# Patient Record
Sex: Male | Born: 1935
Health system: Southern US, Community
[De-identification: ages and names within clinical notes are randomized; demographics above are authoritative.]

## PROBLEM LIST (undated history)

## (undated) DIAGNOSIS — N400 Enlarged prostate without lower urinary tract symptoms: Secondary | ICD-10-CM

## (undated) DIAGNOSIS — C61 Malignant neoplasm of prostate: Secondary | ICD-10-CM

## (undated) DIAGNOSIS — I10 Essential (primary) hypertension: Secondary | ICD-10-CM

## (undated) DIAGNOSIS — I499 Cardiac arrhythmia, unspecified: Secondary | ICD-10-CM

## (undated) DIAGNOSIS — K219 Gastro-esophageal reflux disease without esophagitis: Secondary | ICD-10-CM

## (undated) DIAGNOSIS — C189 Malignant neoplasm of colon, unspecified: Secondary | ICD-10-CM

## (undated) DIAGNOSIS — H9190 Unspecified hearing loss, unspecified ear: Secondary | ICD-10-CM

## (undated) HISTORY — DX: Malignant neoplasm of prostate: C61

## (undated) HISTORY — PX: HERNIA REPAIR: SHX51

---

## 1997-12-13 DIAGNOSIS — C189 Malignant neoplasm of colon, unspecified: Secondary | ICD-10-CM

## 1997-12-13 HISTORY — PX: COLON SURGERY: SHX602

## 1997-12-13 HISTORY — DX: Malignant neoplasm of colon, unspecified: C18.9

## 2000-12-13 HISTORY — PX: COLONOSCOPY: SHX174

## 2001-04-18 ENCOUNTER — Ambulatory Visit (HOSPITAL_COMMUNITY): Admission: RE | Admit: 2001-04-18 | Discharge: 2001-04-18 | Payer: Self-pay | Admitting: Internal Medicine

## 2005-10-08 ENCOUNTER — Emergency Department (HOSPITAL_COMMUNITY): Admission: EM | Admit: 2005-10-08 | Discharge: 2005-10-08 | Payer: Self-pay | Admitting: Emergency Medicine

## 2005-10-21 ENCOUNTER — Ambulatory Visit: Payer: Self-pay | Admitting: Orthopedic Surgery

## 2009-05-28 ENCOUNTER — Emergency Department (HOSPITAL_COMMUNITY): Admission: EM | Admit: 2009-05-28 | Discharge: 2009-05-28 | Payer: Self-pay | Admitting: Emergency Medicine

## 2009-05-31 ENCOUNTER — Observation Stay (HOSPITAL_COMMUNITY): Admission: EM | Admit: 2009-05-31 | Discharge: 2009-06-01 | Payer: Self-pay | Admitting: Emergency Medicine

## 2010-08-02 ENCOUNTER — Inpatient Hospital Stay (HOSPITAL_COMMUNITY): Admission: EM | Admit: 2010-08-02 | Discharge: 2010-08-04 | Payer: Self-pay | Admitting: Emergency Medicine

## 2010-08-27 ENCOUNTER — Ambulatory Visit: Payer: Self-pay | Admitting: Internal Medicine

## 2010-09-16 DIAGNOSIS — D649 Anemia, unspecified: Secondary | ICD-10-CM

## 2010-09-16 DIAGNOSIS — K921 Melena: Secondary | ICD-10-CM

## 2011-01-12 NOTE — Letter (Signed)
Summary: TCS ORDER  TCS ORDER   Imported By: Ave Filter 08/27/2010 11:59:34  _____________________________________________________________________  External Attachment:    Type:   Image     Comment:   External Document  Appended Document: TCS ORDER PATIENTS DAUGHTER CALLED AND STATED SHE WOULD NEED TO CX THIS APPOINTMENT MR Brubacher FELL AND IS IN ALOT OF PAIN AT THIS TIME AND THEY WOULD CALL BACK AT A LATER DATE TO RESCHEDULE

## 2011-01-12 NOTE — Assessment & Plan Note (Signed)
Summary: HOSP F/U PARTIAL BOWEL OBSTRUCTION/LAW   Visit Type:  Follow-up Visit Primary Care Provider:  none  Chief Complaint:  follow up from hosp- bowel obstruction. pt feels good now.  History of Present Illness: Patient is a 77 year old gentleman recently admitted to the hospital with an SBO secondary to incarcerated right inguinal hernia. This was easily reduced by the ED physician. During his brief hospitalization he was noted to have a diminution in hemoglobin from 13-10 and he was occult blood positive. Clinically, has not had any melena or rectal bleeding. Hasn't had any abdominal pain has any recurrent obstructing symptoms. He saw Dr. Lovell Sheehan while in the hospital. He felt urgent surgery was not needed. Tony Holt has occasional reflux symptoms on the order of about once weekly. He is not on any aci suppression therapy. He does not have a primary care physician. He states his last colonoscopy was 7-8 years ago. He states he had trhough this office.  In fact, in reviewing the old medical records here, this gentleman was found to have a sigmoid colon cancer back in 1999.  He underwent a segmental resection by Drs. Katrinka Blazing and [place]. He failed to return for his one-year followup; he did return for three-year followup in 2002. He was found to have multiple colonic polyps which were removed via snare. These polyps turn out to be adenomatous. This gentleman apparently has not come back for any surveillance examinations since 2002.  Preventive Screening-Counseling & Management  Alcohol-Tobacco     Smoking Status: never  Current Medications (verified): 1)  Tylenol Pm Extra Strength 500-25 Mg Tabs (Diphenhydramine-Apap (Sleep)) .... At Bedtime  Allergies (verified): No Known Drug Allergies  Past History:  Past Medical History: bowel obstruction   Past Surgical History: hernia repair tumor in [place]  Family History: Father: deceased 48 MI Mother: deceased Siblings:3 brothers, 2  sisters  No FH of Colon Cancer:  Social History: Marital Status:  Children: 4 Occupation: retired Patient has never smoked.  Alcohol Use - no Smoking Status:  never  Vital Signs:  Patient profile:   77 year old male Height:      69 inches Weight:      130 pounds BMI:     19.27 Temp:     97.9 degrees F oral Pulse rate:   60 / minute BP sitting:   138 / 90  (left arm) Cuff size:   regular  Vitals Entered By: Tony Limes LPN (August 27, 2010 11:15 AM)  Physical Exam  General:  pleasant alert gentleman well oriented conversant and in no acute distress Eyes:  no scleral icterus. Conjunctiva are peak Lungs:  clear to auscultation. Heart:  regular rate and rhythm without murmur gallop rub Abdomen:  flat, positive bowel sounds, soft nontender;  no mass or hepatosplenomegaly  Impression & Recommendations: Impression: Very pleasant 77 year old gentleman recently hospitalized with transient incarcerated right inguinal hernia producing a small bowel obstruction. He had a notable diminution and hemoglobin and was occult blood positive. He has a most significant history for sigmoid colon cancer resected back in 99. His last colonoscopy was in 2002. At that time, he had multiple colonic adenomas. This gentleman is far overdue for surveillance examination. His anemia and Hemoccult-positive stool deserve further evaluation as soon as possible.  Recommendations: Diagnostic colonoscopy in the very near future. I discussed the risks, benefits, limitations, alternatives and imponderables with this nice gentleman today. His questions have been answered. He is agreeable. Further recommendations to follow.  Appended Document: Orders Update  Clinical Lists Changes  Problems: Added new problem of ANEMIA (ICD-285.9) Added new problem of HEMOCCULT POSITIVE STOOL (ICD-578.1) Orders: Added new Service order of New Patient Level III [phone]) - Signed

## 2011-02-07 ENCOUNTER — Emergency Department (HOSPITAL_COMMUNITY): Payer: Medicare PPO

## 2011-02-07 ENCOUNTER — Inpatient Hospital Stay (HOSPITAL_COMMUNITY)
Admission: RE | Admit: 2011-02-07 | Discharge: 2011-02-09 | DRG: 394 | Disposition: A | Discharge status: Home / Self Care | Payer: Medicare PPO | Source: Ambulatory Visit | Attending: Family Medicine | Admitting: Family Medicine

## 2011-02-07 DIAGNOSIS — N390 Urinary tract infection, site not specified: Secondary | ICD-10-CM | POA: Diagnosis present

## 2011-02-07 DIAGNOSIS — K4090 Unilateral inguinal hernia, without obstruction or gangrene, not specified as recurrent: Principal | ICD-10-CM | POA: Diagnosis present

## 2011-02-07 LAB — HEPATIC FUNCTION PANEL
ALT: 10 U/L (ref 0–53)
AST: 18 U/L (ref 0–37)
Albumin: 4.6 g/dL (ref 3.5–5.2)
Alkaline Phosphatase: 54 U/L (ref 39–117)
Bilirubin, Direct: 0.2 mg/dL (ref 0.0–0.3)
Indirect Bilirubin: 0.9 mg/dL (ref 0.3–0.9)
Total Bilirubin: 1.1 mg/dL (ref 0.3–1.2)
Total Protein: 7.6 g/dL (ref 6.0–8.3)

## 2011-02-07 LAB — URINALYSIS, ROUTINE W REFLEX MICROSCOPIC
Ketones, ur: 15 mg/dL — AB
Leukocytes, UA: NEGATIVE
Nitrite: NEGATIVE
Specific Gravity, Urine: >1.03 — ABNORMAL HIGH (ref 1.005–1.030)
Urine Glucose, Fasting: NEGATIVE mg/dL
Urobilinogen, UA: 0.2 mg/dL (ref 0.0–1.0)
pH: 5 (ref 5.0–8.0)

## 2011-02-07 LAB — BASIC METABOLIC PANEL
BUN: 13 mg/dL (ref 6–23)
CO2: 23 mEq/L (ref 19–32)
Calcium: 10.3 mg/dL (ref 8.4–10.5)
Chloride: 103 mEq/L (ref 96–112)
Creatinine, Ser: 1.1 mg/dL (ref 0.4–1.5)
GFR calc Af Amer: >60 mL/min (ref >60)
GFR calc non Af Amer: >60 mL/min (ref >60)
Glucose, Bld: 176 mg/dL — ABNORMAL HIGH (ref 70–99)
Sodium: 139 mEq/L (ref 135–145)

## 2011-02-07 LAB — CBC
Hemoglobin: 14.6 g/dL (ref 13.0–17.0)
MCV: 91.6 fL (ref 78.0–100.0)
Platelets: 192 10*3/uL (ref 150–400)
RBC: 4.78 MIL/uL (ref 4.22–5.81)
RDW: 13.2 % (ref 11.5–15.5)
WBC: 18.5 10*3/uL — ABNORMAL HIGH (ref 4.0–10.5)

## 2011-02-07 LAB — DIFFERENTIAL
Basophils Absolute: 0 10*3/uL (ref 0.0–0.1)
Basophils Relative: 0 % (ref 0–1)
Eosinophils Absolute: 0.1 10*3/uL (ref 0.0–0.7)
Eosinophils Relative: 0 % (ref 0–5)
Lymphocytes Relative: 2 % — ABNORMAL LOW (ref 12–46)
Lymphs Abs: 0.4 10*3/uL — ABNORMAL LOW (ref 0.7–4.0)
Monocytes Absolute: 1.2 10*3/uL — ABNORMAL HIGH (ref 0.1–1.0)
Monocytes Relative: 6 % (ref 3–12)
Neutro Abs: 16.8 10*3/uL — ABNORMAL HIGH (ref 1.7–7.7)
Neutrophils Relative %: 91 % — ABNORMAL HIGH (ref 43–77)

## 2011-02-07 LAB — LIPASE, BLOOD: Lipase: 18 U/L (ref 11–59)

## 2011-02-07 LAB — URINE MICROSCOPIC-ADD ON

## 2011-02-07 MED ORDER — IOHEXOL 300 MG/ML  SOLN
100.0000 mL | Freq: Once | INTRAMUSCULAR | Status: AC | PRN
  Administered 2011-02-07: 100 mL via INTRAVENOUS

## 2011-02-08 ENCOUNTER — Inpatient Hospital Stay (HOSPITAL_COMMUNITY): Payer: Medicare PPO

## 2011-02-08 LAB — COMPREHENSIVE METABOLIC PANEL
ALT: 9 U/L (ref 0–53)
AST: 14 U/L (ref 0–37)
Albumin: 3.7 g/dL (ref 3.5–5.2)
CO2: 22 mEq/L (ref 19–32)
Chloride: 106 mEq/L (ref 96–112)
Creatinine, Ser: 0.87 mg/dL (ref 0.4–1.5)
GFR calc Af Amer: >60 mL/min (ref >60)
GFR calc non Af Amer: >60 mL/min (ref >60)
Glucose, Bld: 111 mg/dL — ABNORMAL HIGH (ref 70–99)
Sodium: 138 mEq/L (ref 135–145)
Total Bilirubin: 1 mg/dL (ref 0.3–1.2)

## 2011-02-08 LAB — DIFFERENTIAL
Basophils Absolute: 0 10*3/uL (ref 0.0–0.1)
Basophils Relative: 0 % (ref 0–1)
Eosinophils Relative: 0 % (ref 0–5)
Monocytes Absolute: 1 10*3/uL (ref 0.1–1.0)
Neutro Abs: 10.8 10*3/uL — ABNORMAL HIGH (ref 1.7–7.7)

## 2011-02-08 LAB — CBC
Hemoglobin: 12.3 g/dL — ABNORMAL LOW (ref 13.0–17.0)
MCHC: 33.1 g/dL (ref 30.0–36.0)
Platelets: 154 10*3/uL (ref 150–400)
RDW: 13 % (ref 11.5–15.5)

## 2011-02-08 LAB — GLUCOSE, CAPILLARY
Glucose-Capillary: 115 mg/dL — ABNORMAL HIGH (ref 70–99)
Glucose-Capillary: 125 mg/dL — ABNORMAL HIGH (ref 70–99)
Glucose-Capillary: 119 mg/dL — ABNORMAL HIGH (ref 70–99)

## 2011-02-08 LAB — HEMOGLOBIN A1C: Mean Plasma Glucose: 117 mg/dL — ABNORMAL HIGH (ref <117)

## 2011-02-08 LAB — TSH: TSH: 1.997 u[IU]/mL (ref 0.350–4.500)

## 2011-02-08 LAB — MAGNESIUM: Magnesium: 1.8 mg/dL (ref 1.5–2.5)

## 2011-02-09 LAB — GLUCOSE, CAPILLARY
Glucose-Capillary: 106 mg/dL — ABNORMAL HIGH (ref 70–99)
Glucose-Capillary: 97 mg/dL (ref 70–99)

## 2011-02-09 LAB — URINE CULTURE

## 2011-02-10 LAB — GLUCOSE, CAPILLARY: Glucose-Capillary: 99 mg/dL (ref 70–99)

## 2011-02-11 HISTORY — PX: HERNIA REPAIR: SHX51

## 2011-02-12 NOTE — Discharge Summary (Signed)
  Tony Holt, Tony Holt               ACCOUNT NO.:  [id number]  MEDICAL RECORD NO.:  [id number]           PATIENT TYPE:  I  LOCATION:  A327                          FACILITY:  [facility]  PHYSICIAN:  Tarry Kos, MD       DATE OF BIRTH:  04-10-1926  DATE OF ADMISSION:  02/07/2011 DATE OF DISCHARGE:  02/28/2012LH                              DISCHARGE SUMMARY   DISCHARGE DIAGNOSES: 1. Abdominal pain. 2. Right reducible inguinal hernia.  SUMMARY OF HOSPITAL COURSE:  [name] is a pleasant 84-year male who presented to the hospital with right lower abdominal pain due to an inguinal hernia.  It was reduced in the ED.  He was observed overnight. Surgery was consulted and recommended followup with them in approximately 2 weeks for elective outpatient surgical repair of this hernia.  [name] is tolerating a diet at the time of discharge.  His hernia was reduced and his abdominal pain resolved.  VITAL SIGNS:  He has been afebrile.  Vital signs stable. GENERAL:  Alert and oriented x4.  No apparent distress, cooperative, friendly. COR:  Regular rate and rhythm without murmurs or gallops. CHEST:  Clear to auscultation bilaterally.  No wheezes or rales. ABDOMEN:  Soft, nontender, nondistended.  Positive bowel sounds.  No hepatosplenomegaly. EXTREMITIES:  No clubbing, cyanosis, or edema. PSYCHIATRIC:  Normal mood and affect. NEUROLOGIC:  No focal neurologic deficits. SKIN:  No rashes.  He is being discharged home to follow up with his primary care physician in 1 week.  He is to continue taking his home medication regimen which only consists of Tylenol PM as needed.  He is to follow up with PCP in 1 week and follow up with Surgery in 2 weeks, Dr. Lovell Sheehan' group for repair of the hernia.                                           ______________________________ Tarry Kos, MD     RD/MEDQ  D:  02/09/2011  T:  02/10/2011  Job:  [id number]  Electronically Signed by Eldridge Dace MD on  02/12/2011 02:11:04 PM

## 2011-02-26 LAB — CBC
HCT: 28 % — ABNORMAL LOW (ref 39.0–52.0)
Hemoglobin: 13.9 g/dL (ref 13.0–17.0)
Hemoglobin: 9.3 g/dL — ABNORMAL LOW (ref 13.0–17.0)
MCH: 31.4 pg (ref 26.0–34.0)
MCHC: 33.3 g/dL (ref 30.0–36.0)
MCHC: 34 g/dL (ref 30.0–36.0)
MCV: 94.6 fL (ref 78.0–100.0)
Platelets: 118 10*3/uL — ABNORMAL LOW (ref 150–400)
Platelets: 180 10*3/uL (ref 150–400)
RBC: 2.96 MIL/uL — ABNORMAL LOW (ref 4.22–5.81)
RBC: 3.17 MIL/uL — ABNORMAL LOW (ref 4.22–5.81)
RBC: 4.37 MIL/uL (ref 4.22–5.81)
RDW: 14.2 % (ref 11.5–15.5)
WBC: 6.1 10*3/uL (ref 4.0–10.5)

## 2011-02-26 LAB — URINALYSIS, ROUTINE W REFLEX MICROSCOPIC
Bilirubin Urine: NEGATIVE
Glucose, UA: NEGATIVE mg/dL
Urobilinogen, UA: 0.2 mg/dL (ref 0.0–1.0)

## 2011-02-26 LAB — BASIC METABOLIC PANEL
Calcium: 10.2 mg/dL (ref 8.4–10.5)
Creatinine, Ser: 1.18 mg/dL (ref 0.4–1.5)
GFR calc Af Amer: >60 mL/min (ref >60)
GFR calc non Af Amer: 59 mL/min — ABNORMAL LOW (ref >60)
Glucose, Bld: 137 mg/dL — ABNORMAL HIGH (ref 70–99)
Sodium: 138 mEq/L (ref 135–145)

## 2011-02-26 LAB — DIFFERENTIAL
Basophils Absolute: 0 10*3/uL (ref 0.0–0.1)
Basophils Absolute: 0 10*3/uL (ref 0.0–0.1)
Eosinophils Absolute: 0 10*3/uL (ref 0.0–0.7)
Eosinophils Absolute: 0 10*3/uL (ref 0.0–0.7)
Eosinophils Relative: 1 % (ref 0–5)
Lymphocytes Relative: 14 % (ref 12–46)
Lymphocytes Relative: 17 % (ref 12–46)
Lymphs Abs: 0.6 10*3/uL — ABNORMAL LOW (ref 0.7–4.0)
Lymphs Abs: 0.8 10*3/uL (ref 0.7–4.0)
Lymphs Abs: 0.8 10*3/uL (ref 0.7–4.0)
Monocytes Absolute: 0.5 10*3/uL (ref 0.1–1.0)
Monocytes Relative: 8 % (ref 3–12)
Neutro Abs: 14.8 10*3/uL — ABNORMAL HIGH (ref 1.7–7.7)
Neutro Abs: 4.7 10*3/uL (ref 1.7–7.7)
Neutrophils Relative %: 70 % (ref 43–77)
Neutrophils Relative %: 77 % (ref 43–77)
Neutrophils Relative %: 90 % — ABNORMAL HIGH (ref 43–77)

## 2011-02-26 LAB — LACTIC ACID, PLASMA: Lactic Acid, Venous: 1.5 mmol/L (ref 0.5–2.2)

## 2011-02-26 LAB — COMPREHENSIVE METABOLIC PANEL
Albumin: 3.2 g/dL — ABNORMAL LOW (ref 3.5–5.2)
Alkaline Phosphatase: 36 U/L — ABNORMAL LOW (ref 39–117)
BUN: 7 mg/dL (ref 6–23)
CO2: 26 mEq/L (ref 19–32)
Calcium: 8.5 mg/dL (ref 8.4–10.5)
Calcium: 8.6 mg/dL (ref 8.4–10.5)
Chloride: 109 mEq/L (ref 96–112)
Creatinine, Ser: 0.97 mg/dL (ref 0.4–1.5)
GFR calc non Af Amer: >60 mL/min (ref >60)
Glucose, Bld: 102 mg/dL — ABNORMAL HIGH (ref 70–99)
Potassium: 3.8 mEq/L (ref 3.5–5.1)
Total Bilirubin: 0.4 mg/dL (ref 0.3–1.2)
Total Protein: 5.2 g/dL — ABNORMAL LOW (ref 6.0–8.3)

## 2011-02-26 LAB — POCT CARDIAC MARKERS
CKMB, poc: 1.2 ng/mL (ref 1.0–8.0)
Myoglobin, poc: 54.3 ng/mL (ref 12–200)

## 2011-02-26 LAB — MRSA PCR SCREENING: MRSA by PCR: NEGATIVE

## 2011-02-26 LAB — URINE MICROSCOPIC-ADD ON

## 2011-02-26 LAB — HEMOCCULT GUIAC POC 1CARD (OFFICE): Fecal Occult Bld: POSITIVE

## 2011-02-26 LAB — HEPATIC FUNCTION PANEL
ALT: 11 U/L (ref 0–53)
Indirect Bilirubin: 0.8 mg/dL (ref 0.3–0.9)
Total Protein: 7.3 g/dL (ref 6.0–8.3)

## 2011-02-26 LAB — LIPASE, BLOOD: Lipase: 26 U/L (ref 11–59)

## 2011-02-26 LAB — AMYLASE: Amylase: 50 U/L (ref 0–105)

## 2011-03-08 ENCOUNTER — Other Ambulatory Visit: Payer: Self-pay | Admitting: General Surgery

## 2011-03-08 ENCOUNTER — Encounter (HOSPITAL_COMMUNITY): Payer: Medicare PPO | Attending: General Surgery

## 2011-03-08 DIAGNOSIS — Z01812 Encounter for preprocedural laboratory examination: Secondary | ICD-10-CM | POA: Insufficient documentation

## 2011-03-08 LAB — CBC
HCT: 38.3 % — ABNORMAL LOW (ref 39.0–52.0)
MCV: 91.8 fL (ref 78.0–100.0)
Platelets: 147 10*3/uL — ABNORMAL LOW (ref 150–400)
RBC: 4.17 MIL/uL — ABNORMAL LOW (ref 4.22–5.81)
RDW: 13 % (ref 11.5–15.5)
WBC: 5.2 10*3/uL (ref 4.0–10.5)

## 2011-03-08 LAB — BASIC METABOLIC PANEL
BUN: 12 mg/dL (ref 6–23)
Chloride: 105 mEq/L (ref 96–112)
GFR calc non Af Amer: >60 mL/min (ref >60)
Potassium: 3.4 mEq/L — ABNORMAL LOW (ref 3.5–5.1)
Sodium: 140 mEq/L (ref 135–145)

## 2011-03-08 LAB — SURGICAL PCR SCREEN: Staphylococcus aureus: NEGATIVE

## 2011-03-12 ENCOUNTER — Ambulatory Visit (HOSPITAL_COMMUNITY)
Admission: RE | Admit: 2011-03-12 | Discharge: 2011-03-12 | Disposition: A | Discharge status: Home / Self Care | Payer: Medicare PPO | Source: Ambulatory Visit | Attending: General Surgery | Admitting: General Surgery

## 2011-03-12 DIAGNOSIS — K4090 Unilateral inguinal hernia, without obstruction or gangrene, not specified as recurrent: Secondary | ICD-10-CM | POA: Insufficient documentation

## 2011-03-12 DIAGNOSIS — J449 Chronic obstructive pulmonary disease, unspecified: Secondary | ICD-10-CM | POA: Insufficient documentation

## 2011-03-12 DIAGNOSIS — J4489 Other specified chronic obstructive pulmonary disease: Secondary | ICD-10-CM | POA: Insufficient documentation

## 2011-03-12 NOTE — H&P (Addendum)
NAMEWEILAND, TOMICH               ACCOUNT NO.:  [id number]  MEDICAL RECORD NO.:  [id number]           PATIENT TYPE:  LOCATION:                                 FACILITY:  PHYSICIAN:  Tilford Pillar, MD      DATE OF BIRTH:  Apr 29, 1926  DATE OF ADMISSION: DATE OF DISCHARGE:  LH                             HISTORY & PHYSICAL   CHIEF COMPLAINT:  Increased discomfort and bulge in the groin.  HISTORY OF PRESENT ILLNESS:  The patient is an 77 year old male recently evaluated by myself in consultation at [facility] with a noted right inguinal hernia.  It actually presented with his bowel obstruction, was admitted to the Hospitalist.  His symptomatology resolved, and although the hernia was suspected as the likely source of his small bowel obstruction, it was advised the patient undergo repair. He has sign of no increased shortness of breath, no increased pain, no change in bowel movements, no melena, no hematochezia, no nausea, no vomiting, no recurrent symptoms of incarceration or strangulation.  PAST MEDICAL HISTORY:  History of gastroenteritis, history of emphysema.  PAST SURGICAL HISTORY:  None.  MEDICATIONS:  Reviewed with the patient and include home oxygen, Atrovent.  ALLERGIES:  No known drug allergies.  SOCIAL HISTORY:  No tobacco.  No alcohol.  No recreational drug use.  FAMILY HISTORY:  Noncontributory.  REVIEW OF SYSTEMS:  CONSTITUTIONAL:  Unremarkable.  EYES:  Unremarkable. EARS, NOSE, AND THROAT:  Unremarkable.  RESPIRATORY:  Complains of shortness of breath and chest.  No change from baseline. CARDIOVASCULAR:  Unremarkable.  GASTROINTESTINAL:  As per HPI. GENITOURINARY:  Unremarkable.  MUSCULOSKELETAL:  Unremarkable. NEUROLOGIC:  Unremarkable.  SKIN:  Unremarkable.  PHYSICAL EXAMINATION:  GENERAL:  The patient is an elderly-appearing male.  He is calm.  He is not in acute distress.  He is alert and oriented x3. HEENT:  Scalp no deformities or masses.   Eyes, pupils equal, round, reactive.  Extraocular movements are intact.  No scleral icterus, conjunctival pallor.  Oral mucosa pink, normal occlusion. NECK:  Trachea is midline.  No cervical lymphadenopathy. PULMONARY:  Unlabored respiration.  He does have wheezing and some fine crackles.  He has full breath sounds bilaterally. CARDIOVASCULAR:  Regular rate and rhythm.  No murmurs or gallops. ABDOMEN:  Positive bowel sounds.  Abdomen is soft, nontender.  He does have positive right inguinal hernia that is reducible.  No masses or hernias appreciated.  Normal male external genitalia are noted. Bilateral testicles are noted. SKIN:  Warm and dry.  ASSESSMENT AND PLAN:  Right inguinal hernia.  At this time, I discussed again with the patient the risks, benefits, and alternatives of an inguinal hernia repair with mesh including but not limited to the risk of bleeding, infection, infection, mesh requiring removal and subsequent repair, paresthesia, testicular loss, chronic pain, as well as possibility of intraoperative cardiopulmonary event.  His questions and concerns were addressed, and he will be consented and scheduled at his earliest convenience.     Tilford Pillar, MD     BZ/MEDQ  D:  03/11/2011  T:  03/11/2011  Job:  [id number]  Electronically Signed by Tilford Pillar MD on 03/12/2011 12:23:19 PM

## 2011-03-15 NOTE — H&P (Signed)
Tony Holt, Tony Holt               ACCOUNT NO.:  [id number]  MEDICAL RECORD NO.:  [id number]           PATIENT TYPE:  LOCATION:                                 FACILITY:  PHYSICIAN:  Eduard Clos, MDDATE OF BIRTH:  Apr 18, 1926  DATE OF ADMISSION: DATE OF DISCHARGE:  LH                             HISTORY & PHYSICAL   PRIMARY CARE PHYSICIAN:  Unassigned.  CHIEF COMPLAINT:  Abdominal pain.  HISTORY OF PRESENT ILLNESS:  This is an 77 year old male with no significant past medical history presenting with complaint of abdominal pain.  The pain is mostly in the right inguinal area.  He has been having multiple episodes of nausea and vomiting.  In the ER, the patient was found to have right inguinal hernia which was reducible.  CT abdomen and pelvis was done which did not show anything acute.  The patient is still having some nausea and vomiting at this time.  The patient has been admitted for further workup.  The patient denies any chest pain or any shortness of breath.  Denies any fever or chills, cough or phlegm. Denies any dizziness, loss of conscious, any focal deficit.  The patient has had similar episode last year in August, and at that time, the patient's symptoms resolved after the hernia was reduced.  At that time, he also was found to have dilated ducts of liver which is still found.  PAST MEDICAL HISTORY:  Nothing significant.  PAST SURGICAL HISTORY:  He has had a tumor removed in his abdomen many years ago.  SOCIAL HISTORY:  The patient quit smoking but still chews tobacco, denies any alcohol or drug abuse.  FAMILY HISTORY:  Mom deceased from unknown medical illness at age 49.  ALLERGIES:  No known drug allergies.  REVIEW OF SYSTEMS:  As per history of present illness, nothing else significant.  PHYSICAL EXAMINATION:  GENERAL:  The patient was examined at bedside, not in acute distress. VITAL SIGNS:  Blood pressure is 140/50, initially when he came in it  was very high, it was 200/70; pulse is 90 per minute; temperature 98.2; respirations 18 per minute; O2 sat is 96%. HEENT:  Anicteric.  No pallor.  No discharge from ears, eyes, nose, or mouth. CHEST:  Bilateral air entry present.  No rhonchi, no crepitation. HEART:  S1 and S2 heard. ABDOMEN:  Soft, nontender.  There is a right inguinal hernia which is reducible.  No guarding or rigidity.  Bowel sounds present. CENTRAL NERVOUS SYSTEM:  Alert, awake, and oriented to time, place, and person, moves upper and lower extremities, 5/5. EXTREMITIES:  Peripheral pulses felt.  No edema.  LABORATORY DATA:  CT abdomen and pelvis with contrast shows persistent biliary dilatation, correlate with liver function tests, small amount of free fluid in the abdomen unchanged, otherwise stable exam. CBC: WBC is 18.5, hemoglobin is 14.6, hematocrit is 43.8, platelets 192, neutrophils 91%.  Complete metabolic panel:  Sodium 139, potassium 3.8, chloride 103, carbon dioxide 23, glucose 176, BUN 13, creatinine 1.1, total bilirubin is 1.1, alk phos 54, AST 18, ALT 10, total protein 7.6, albumin 4.6, potassium 10.3, lipase 18.  UA:  Glucose negative, ketones 15, nitrites and leukocytes negative, hyaline casts, wbc's 7-10, bacteria few.  ASSESSMENT: 1. Abdominal pain with right inguinal hernia, at this time it is     reducible. 2. Nausea and vomiting. 3. Leukocytosis with the possibility of urinary tract infection. 4. Persistent biliary dilatation with normal LFTs.  PLAN: 1. At this time, we will admit the patient to medical floor. 2. For the abdominal pain and inguinal hernia, the patient still has     some nausea, so I am going to keep the patient n.p.o., get a     Surgical consult with Dr. Lovell Sheehan in a.m.  At this time, I will be     hydrating the patient. 3. Leukocytosis with a possible UTI.  We will get urine culture.  I am     going to place the patient on Cipro and Flagyl. 4. Further recommendations  based on test order and clinical course and     consults recommendations.     Eduard Clos, MD     ANK/MEDQ  D:  02/08/2011  T:  02/08/2011  Job:  [id number]  Electronically Signed by Midge Minium MD on 03/15/2011 07:17:31 AM

## 2011-03-22 LAB — POCT I-STAT, CHEM 8
BUN: 16 mg/dL (ref 6–23)
Calcium, Ion: 1.21 mmol/L (ref 1.12–1.32)
Chloride: 99 mEq/L (ref 96–112)
Creatinine, Ser: 1 mg/dL (ref 0.4–1.5)
Glucose, Bld: 98 mg/dL (ref 70–99)
HCT: 36 % — ABNORMAL LOW (ref 39.0–52.0)
Potassium: 4.6 mEq/L (ref 3.5–5.1)

## 2011-03-22 LAB — DIFFERENTIAL
Lymphocytes Relative: 8 % — ABNORMAL LOW (ref 12–46)
Lymphs Abs: 0.9 10*3/uL (ref 0.7–4.0)
Monocytes Absolute: 0.5 10*3/uL (ref 0.1–1.0)
Monocytes Relative: 5 % (ref 3–12)
Neutro Abs: 9.8 10*3/uL — ABNORMAL HIGH (ref 1.7–7.7)
Neutrophils Relative %: 87 % — ABNORMAL HIGH (ref 43–77)

## 2011-03-22 LAB — CBC
Hemoglobin: 11.7 g/dL — ABNORMAL LOW (ref 13.0–17.0)
RBC: 3.67 MIL/uL — ABNORMAL LOW (ref 4.22–5.81)
WBC: 11.3 10*3/uL — ABNORMAL HIGH (ref 4.0–10.5)

## 2011-04-12 NOTE — Op Note (Signed)
Tony Holt, Tony Holt               ACCOUNT NO.:  [id number]  MEDICAL RECORD NO.:  [id number]           PATIENT TYPE:  O  LOCATION:  DAYP                          FACILITY:  [facility]  PHYSICIAN:  Tilford Pillar, MD      DATE OF BIRTH:  1926/06/14  DATE OF PROCEDURE:  03/12/2011 DATE OF DISCHARGE:  03/12/2011                              OPERATIVE REPORT   PREOPERATIVE DIAGNOSES:  Right inguinal hernia.  POSTOPERATIVE DIAGNOSES:  Right inguinal hernia.  PROCEDURE:  Right inguinal hernia repair with mesh.  SURGEON:  Tilford Pillar, MD  SPECIMEN:  None.  ESTIMATED BLOOD LOSS:  Minimal.  INDICATIONS:  The patient is an 77 year old male who presented to my office with a history of a bulge in the right groin.  This had been increasing in size and some discomfort in that area.  Risks, benefits, and alternatives of repair were discussed at length with the patient including but not limited to the risk of bleeding, infection, mesh infection requiring removal and subsequent repair, possibility of paresthesias, chronic pain, and nerve entrapment, testicular loss, and testicular ischemic orchitis, as well as possibility of intraoperative cardiopulmonary events.  His questions and concerns were addressed and the patient was consented for planned procedure.  OPERATION:  The patient was taken to the operating room.  Spinal anesthetic was administered by Anesthesia and he was placed into a supine position with confirmation that the spinal anesthetic had taken effect.  The patient's right groin and abdominal wall were prepped with DuraPrep solution and draped in standard fashion.  A skin incision was created with a scalpel.  Additional dissection down to subcuticular tissue was carried using electrocautery including division of Scarpa fascia.  Unfortunately due to the patient's large size of the hernia and chronicity of this, the anatomic findings were quite obscured.  I was able to enter into  the external oblique fascia; however, the defined edges of the fascia were poorly defined and attenuated.  I did free the large hernia from the wall of the canal.  The inguinal ligament was identified.  Although the remainder of the canal structures were poorly defined.  I was able to reduce the large hernia with significant amount of omental incarcerated fat and some small bowel was packed into the abdominal cavity.  As this was a wide-based defect, I did opt to close the base of the hernia sac with 0 Ethibond suture in a pursestring fashion.  At this time, I did also place a large mesh plug into the defect and secured a mesh onlay over the floor of the canal.  This was pexed medially over the pubic tubercle, inferiorly to the inguinal ligament, superiorly to what I suspected to be the conjoined tendon and again this was poorly defined.  The tails were secured around the cord structures and under the remnants of the external oblique fascia.  At this point, the wound was irrigated.  Hemostasis was excellent.  The external oblique fascia was reapproximated using 2-0 Vicryl, the Scarpa fascia was reapproximated using 3-0 Vicryl, and the skin edges were reapproximated using a 4-0 Monocryl and a running subcuticular  suture. At this point, the skin was washed and dried with moistened dry towel. Benzoin was applied around the incision after the local anesthetic was instilled.  Half-inch Steri-Strips were placed.  The drapes were removed.  The patient was allowed to come out of general anesthetic and was transferred back to regular hospital bed in stable condition.  At the conclusion of the procedure, all instrument, sponge, and needle counts were correct.  The patient tolerated the procedure well.     Tilford Pillar, MD     BZ/MEDQ  D:  04/06/2011  T:  04/07/2011  Job:  [id number]  Electronically Signed by Tilford Pillar MD on 04/12/2011 02:24:48 PM

## 2012-02-14 ENCOUNTER — Other Ambulatory Visit (HOSPITAL_COMMUNITY): Payer: Self-pay | Admitting: Oncology

## 2012-02-17 ENCOUNTER — Other Ambulatory Visit (HOSPITAL_COMMUNITY): Payer: Self-pay | Admitting: Oncology

## 2012-03-23 ENCOUNTER — Emergency Department (HOSPITAL_COMMUNITY): Payer: Medicare PPO

## 2012-03-23 ENCOUNTER — Inpatient Hospital Stay (HOSPITAL_COMMUNITY)
Admission: EM | Admit: 2012-03-23 | Discharge: 2012-03-27 | DRG: 392 | Disposition: A | Discharge status: Home / Self Care | Payer: Medicare PPO | Attending: Pulmonary Disease | Admitting: Pulmonary Disease

## 2012-03-23 ENCOUNTER — Encounter (HOSPITAL_COMMUNITY): Payer: Self-pay | Admitting: *Deleted

## 2012-03-23 DIAGNOSIS — R6881 Early satiety: Secondary | ICD-10-CM

## 2012-03-23 DIAGNOSIS — N138 Other obstructive and reflux uropathy: Secondary | ICD-10-CM | POA: Diagnosis present

## 2012-03-23 DIAGNOSIS — K838 Other specified diseases of biliary tract: Secondary | ICD-10-CM | POA: Diagnosis present

## 2012-03-23 DIAGNOSIS — R110 Nausea: Secondary | ICD-10-CM | POA: Diagnosis present

## 2012-03-23 DIAGNOSIS — Z860100 Personal history of colon polyps, unspecified: Secondary | ICD-10-CM

## 2012-03-23 DIAGNOSIS — D696 Thrombocytopenia, unspecified: Secondary | ICD-10-CM | POA: Diagnosis present

## 2012-03-23 DIAGNOSIS — N401 Enlarged prostate with lower urinary tract symptoms: Secondary | ICD-10-CM | POA: Diagnosis present

## 2012-03-23 DIAGNOSIS — R630 Anorexia: Secondary | ICD-10-CM | POA: Diagnosis present

## 2012-03-23 DIAGNOSIS — K449 Diaphragmatic hernia without obstruction or gangrene: Secondary | ICD-10-CM | POA: Diagnosis present

## 2012-03-23 DIAGNOSIS — Z9049 Acquired absence of other specified parts of digestive tract: Secondary | ICD-10-CM

## 2012-03-23 DIAGNOSIS — J111 Influenza due to unidentified influenza virus with other respiratory manifestations: Secondary | ICD-10-CM | POA: Diagnosis present

## 2012-03-23 DIAGNOSIS — R1013 Epigastric pain: Secondary | ICD-10-CM | POA: Diagnosis present

## 2012-03-23 DIAGNOSIS — I10 Essential (primary) hypertension: Secondary | ICD-10-CM | POA: Diagnosis present

## 2012-03-23 DIAGNOSIS — K2100 Gastro-esophageal reflux disease with esophagitis, without bleeding: Principal | ICD-10-CM | POA: Diagnosis present

## 2012-03-23 DIAGNOSIS — J438 Other emphysema: Secondary | ICD-10-CM | POA: Diagnosis present

## 2012-03-23 DIAGNOSIS — R933 Abnormal findings on diagnostic imaging of other parts of digestive tract: Secondary | ICD-10-CM

## 2012-03-23 DIAGNOSIS — K208 Other esophagitis without bleeding: Secondary | ICD-10-CM | POA: Diagnosis present

## 2012-03-23 DIAGNOSIS — R300 Dysuria: Secondary | ICD-10-CM | POA: Diagnosis present

## 2012-03-23 DIAGNOSIS — Z85038 Personal history of other malignant neoplasm of large intestine: Secondary | ICD-10-CM

## 2012-03-23 DIAGNOSIS — Z681 Body mass index (BMI) 19 or less, adult: Secondary | ICD-10-CM

## 2012-03-23 LAB — URINALYSIS, ROUTINE W REFLEX MICROSCOPIC
Ketones, ur: 15 mg/dL — AB
Leukocytes, UA: NEGATIVE
Nitrite: NEGATIVE

## 2012-03-23 LAB — CBC
HCT: 39.8 % (ref 39.0–52.0)
Hemoglobin: 13.3 g/dL (ref 13.0–17.0)
MCHC: 33.4 g/dL (ref 30.0–36.0)
MCV: 92.3 fL (ref 78.0–100.0)

## 2012-03-23 LAB — COMPREHENSIVE METABOLIC PANEL
Albumin: 4 g/dL (ref 3.5–5.2)
BUN: 15 mg/dL (ref 6–23)
CO2: 25 mEq/L (ref 19–32)
Calcium: 9.6 mg/dL (ref 8.4–10.5)
Chloride: 98 mEq/L (ref 96–112)
Creatinine, Ser: 0.95 mg/dL (ref 0.50–1.35)
GFR calc non Af Amer: 75 mL/min — ABNORMAL LOW (ref >90)
Total Bilirubin: 0.4 mg/dL (ref 0.3–1.2)

## 2012-03-23 LAB — DIFFERENTIAL
Basophils Relative: 0 % (ref 0–1)
Eosinophils Relative: 0 % (ref 0–5)
Monocytes Absolute: 0.7 10*3/uL (ref 0.1–1.0)
Monocytes Relative: 11 % (ref 3–12)
Neutro Abs: 4.6 10*3/uL (ref 1.7–7.7)

## 2012-03-23 LAB — LIPASE, BLOOD: Lipase: 17 U/L (ref 11–59)

## 2012-03-23 MED ORDER — ONDANSETRON HCL 4 MG/2ML IJ SOLN
4.0000 mg | Freq: Once | INTRAMUSCULAR | Status: AC
  Administered 2012-03-23: 4 mg via INTRAVENOUS
  Filled 2012-03-23: qty 2

## 2012-03-23 MED ORDER — GI COCKTAIL ~~LOC~~
30.0000 mL | Freq: Once | ORAL | Status: AC
  Administered 2012-03-24: 30 mL via ORAL
  Filled 2012-03-23: qty 30

## 2012-03-23 MED ORDER — SODIUM CHLORIDE 0.9 % IV SOLN: INTRAVENOUS | Status: DC

## 2012-03-23 MED ORDER — HYDROMORPHONE HCL PF 1 MG/ML IJ SOLN
0.5000 mg | Freq: Once | INTRAMUSCULAR | Status: AC
  Administered 2012-03-23: 0.5 mg via INTRAVENOUS
  Filled 2012-03-23: qty 1

## 2012-03-23 MED ORDER — SODIUM CHLORIDE 0.9 % IV BOLUS (SEPSIS)
500.0000 mL | Freq: Once | INTRAVENOUS | Status: AC
  Administered 2012-03-23: 500 mL via INTRAVENOUS

## 2012-03-23 MED ORDER — PANTOPRAZOLE SODIUM 40 MG IV SOLR
40.0000 mg | Freq: Once | INTRAVENOUS | Status: AC
  Administered 2012-03-24: 40 mg via INTRAVENOUS
  Filled 2012-03-23: qty 40

## 2012-03-23 MED ORDER — IOHEXOL 300 MG/ML  SOLN
100.0000 mL | Freq: Once | INTRAMUSCULAR | Status: AC | PRN
  Administered 2012-03-23: 100 mL via INTRAVENOUS

## 2012-03-23 NOTE — ED Notes (Addendum)
Pt presents to the ED with c/o nausea, umbilical pain, weakness, decreased appetite and lethargica.  Pt noted sleeping, easy to arouse, answers appropriately. Denies vomiting and diarrhea. Daughter states pt was working out in garden 2 days prior to hospital visit and this is when pt began to deteriorate. Pt denies being over heated that day.

## 2012-03-23 NOTE — ED Notes (Signed)
Pt drank 1 bottle of contrast, unable to drank second bottle, as it is making "him sick". No vomiting noted.  Ct department aware.

## 2012-03-23 NOTE — ED Notes (Signed)
Nauseated for the past 2 days

## 2012-03-23 NOTE — ED Notes (Signed)
Pt is sitting up in bed drinking ct contrast. No c/o voiced at this time.

## 2012-03-23 NOTE — ED Provider Notes (Signed)
History   This chart was scribed for Tony Hutching, MD by Brooks Sailors. The patient was seen in room [facility]05/[facility]. Patient's care was started at 1729.   CSN: [id number]  Arrival date & time 03/23/12  1729   First MD Initiated Contact with Patient 03/23/12 1954      Chief Complaint  Patient presents with  . Nausea    (Consider location/radiation/quality/duration/timing/severity/associated sxs/prior treatment) HPI  Tony Holt is a 78 y.o. male who presents to the Emergency Department complaining of constant moderate pain in the abdomen onset three days ago with associated decreased appetite, nausea and one episode of vomiting. Patients daughter adds that he is unlike his normal self and is usually able to ambulate well. Pain is localized to the periumbilical area. Patient denies diarrhea and has h/o right inguinal hernia repair.   PCP Dr. Juanetta Gosling   Past Medical History  Diagnosis Date  . Hypertension     Past Surgical History  Procedure Date  . Hernia repair     No family history on file.  History  Substance Use Topics  . Smoking status: Never Smoker   . Smokeless tobacco: Not on file  . Alcohol Use: No      Review of Systems  All other systems reviewed and are negative.    Allergies  Review of patient's allergies indicates no known allergies.  Home Medications  No current outpatient prescriptions on file.  BP 211/64  Pulse 51  Temp(Src) 97.8 F (36.6 C) (Oral)  Resp 18  Ht 5\' 8"  (1.727 m)  Wt 120 lb (54.432 kg)  BMI 18.25 kg/m2  SpO2 100%  Physical Exam  Nursing note and vitals reviewed. Constitutional: He is oriented to person, place, and time. He appears well-developed and well-nourished.  HENT:  Head: Normocephalic and atraumatic.  Eyes: Conjunctivae and EOM are normal. Pupils are equal, round, and reactive to light.  Neck: Normal range of motion. Neck supple.  Cardiovascular: Normal rate and regular rhythm.   Pulmonary/Chest: Effort  normal and breath sounds normal.  Abdominal: Soft. Bowel sounds are normal. There is tenderness in the periumbilical area.  Musculoskeletal: Normal range of motion.  Neurological: He is alert and oriented to person, place, and time.  Skin: Skin is warm and dry.  Psychiatric: He has a normal mood and affect.    ED Course  Procedures (including critical care time) DIAGNOSTIC STUDIES: Oxygen Saturation is 100% on room air, normal by my interpretation.    COORDINATION OF CARE: 8:10PM IVF, checking blood work, urine sample, x-rays of the abdomen.    Labs Reviewed  CBC - Abnormal; Notable for the following:    Platelets 111 (*)    All other components within normal limits  DIFFERENTIAL - Abnormal; Notable for the following:    Neutrophils Relative 79 (*)    Lymphocytes Relative 10 (*)    Lymphs Abs 0.6 (*)    All other components within normal limits  COMPREHENSIVE METABOLIC PANEL - Abnormal; Notable for the following:    Sodium 134 (*)    Glucose, Bld 101 (*)    GFR calc non Af Amer 75 (*)    GFR calc Af Amer 87 (*)    All other components within normal limits  URINALYSIS, ROUTINE W REFLEX MICROSCOPIC - Abnormal; Notable for the following:    Hgb urine dipstick SMALL (*)    Ketones, ur 15 (*)    Protein, ur TRACE (*)    All other components within normal limits  LIPASE, BLOOD  URINE MICROSCOPIC-ADD ON   No results found.   No diagnosis found. Ct Abdomen Pelvis W Contrast  03/23/2012  *RADIOLOGY REPORT*  Clinical Data: Moderate abdominal pain for 3 days; nausea. Umbilical pain and weakness.  Decreased appetite noted.  CT ABDOMEN AND PELVIS WITH CONTRAST  Technique:  Multidetector CT imaging of the abdomen and pelvis was performed following the standard protocol during bolus administration of intravenous contrast.  Contrast: OMNIPAQUE IOHEXOL 300 MG/ML  SOLN  Comparison: CT of the abdomen and pelvis performed 02/26 1012  Findings: Mild right basilar atelectasis or scarring  is noted.  There is distension of the gallbladder, with mild diffuse intrahepatic biliary ductal distension, and dilatation of the common hepatic duct to 1.1 cm.  A stone is suggested within the distal common hepatic duct on coronal images; findings raise suspicion for a more distal obstructing stone.  No significant pericholecystic fluid or gallbladder wall thickening is seen to suggest cholecystitis.  The liver and spleen are unremarkable in appearance.  The gallbladder is within normal limits.  The pancreas and adrenal glands are unremarkable.  Scattered bilateral renal cysts are noted, measuring up to 5.4 cm in size.  Mild nonspecific perinephric stranding is noted bilaterally.  The kidneys are otherwise unremarkable in appearance. There is no evidence of hydronephrosis.  No renal or ureteral stones are seen.  No free fluid is identified.  The small bowel is unremarkable in appearance.  A tiny hiatal hernia is noted; the stomach is otherwise unremarkable in appearance.  No acute vascular abnormalities are seen.  Mild scattered calcification is noted along the abdominal aorta and its branches.  The appendix these difficult to fully characterize but appears grossly normal in caliber; there is no evidence for appendicitis. The colon is partially filled with stool and grossly unremarkable in appearance.  Scattered postoperative change is noted along the distal transverse and descending colon.  The bladder is mildly distended and grossly unremarkable in appearance.  Minimal calcification is noted dependently within the bladder, possibly reflecting recently passed tiny stones, as it appears new from the prior study.  The prostate is borderline normal in size.  No inguinal lymphadenopathy is seen.  Mild soft tissue inflammation and trace fluid noted along the right inguinal canal, of uncertain significance but stable from the prior study.  No acute osseous abnormalities are identified.  Mild vacuum phenomenon is noted  at T12-L1.  IMPRESSION:  1.  Distension of the gallbladder, with mild diffuse intrahepatic biliary ductal dilatation and dilatation of the common hepatic duct to 1.1 cm in diameter.  The intrahepatic biliary ductal dilatation was less prominent on the prior study, and the distal duct dilatation is new.  A small stone is suggested within the distal common hepatic duct, and findings raise suspicion for an obstructing more distal stone.  MRCP or ERCP could be considered for further evaluation, as deemed clinically appropriate. 2.  Scattered bilateral renal cysts noted. 3.  Mild scattered calcification along the abdominal aorta and its branches. 4.  Minimal calcification dependently within the bladder may reflect recently passed tiny stones, as it appears new from the prior study. 5.  Mild soft tissue inflammation and trace fluid along the right inguinal canal, of uncertain significance but stable from the prior study. 6.  Mild right basilar atelectasis or scarring noted. 7.  Tiny hiatal hernia seen.  Original Report Authenticated By: Tonia Ghent, M.D.   CRITICAL CARE Performed by: Tony Holt   Total critical care time: 30  Critical care time was exclusive of separately billable procedures and treating other patients.  Critical care was necessary to treat or prevent imminent or life-threatening deterioration.  Critical care was time spent personally by me on the following activities: development of treatment plan with patient and/or surrogate as well as nursing, discussions with consultants, evaluation of patient's response to treatment, examination of patient, obtaining history from patient or surrogate, ordering and performing treatments and interventions, ordering and review of laboratory studies, ordering and review of radiographic studies, pulse oximetry and re-evaluation of patient's condition.  MDM  Elderly patient with epigastric pain.  CT scan shows distention of the gallbladder, diffuse  intra-hepatic biliary ductal dilatation and dilatation of the common hepatic duct.  Ironically his liver functions and total bilirubin and alkaline phosphatase are normal. White blood cells are normal. Will admit to general medicine for pain control and hydration.  Probable consult to GI in the morning       \I personally performed the services described in this documentation, which was scribed in my presence. The recorded information has been reviewed and considered.    Tony Hutching, MD 03/23/12 2348

## 2012-03-24 ENCOUNTER — Inpatient Hospital Stay (HOSPITAL_COMMUNITY): Payer: Medicare PPO

## 2012-03-24 ENCOUNTER — Encounter (HOSPITAL_COMMUNITY): Payer: Self-pay | Admitting: Internal Medicine

## 2012-03-24 DIAGNOSIS — D649 Anemia, unspecified: Secondary | ICD-10-CM

## 2012-03-24 DIAGNOSIS — R1013 Epigastric pain: Secondary | ICD-10-CM | POA: Diagnosis present

## 2012-03-24 DIAGNOSIS — R110 Nausea: Secondary | ICD-10-CM

## 2012-03-24 DIAGNOSIS — R932 Abnormal findings on diagnostic imaging of liver and biliary tract: Secondary | ICD-10-CM

## 2012-03-24 DIAGNOSIS — I10 Essential (primary) hypertension: Secondary | ICD-10-CM | POA: Diagnosis present

## 2012-03-24 LAB — CBC
MCH: 30.6 pg (ref 26.0–34.0)
MCHC: 33.4 g/dL (ref 30.0–36.0)
MCV: 91.7 fL (ref 78.0–100.0)
Platelets: 109 10*3/uL — ABNORMAL LOW (ref 150–400)
RDW: 12.6 % (ref 11.5–15.5)

## 2012-03-24 LAB — COMPREHENSIVE METABOLIC PANEL
AST: 14 U/L (ref 0–37)
Albumin: 3.7 g/dL (ref 3.5–5.2)
Alkaline Phosphatase: 49 U/L (ref 39–117)
CO2: 25 mEq/L (ref 19–32)
Chloride: 99 mEq/L (ref 96–112)
Creatinine, Ser: 0.86 mg/dL (ref 0.50–1.35)
GFR calc non Af Amer: 78 mL/min — ABNORMAL LOW (ref >90)
Potassium: 4.2 mEq/L (ref 3.5–5.1)
Total Bilirubin: 0.3 mg/dL (ref 0.3–1.2)

## 2012-03-24 LAB — TSH: TSH: 2.845 u[IU]/mL (ref 0.350–4.500)

## 2012-03-24 LAB — CARDIAC PANEL(CRET KIN+CKTOT+MB+TROPI)
CK, MB: 1.2 ng/mL (ref 0.3–4.0)
Relative Index: INVALID (ref 0.0–2.5)
Relative Index: INVALID (ref 0.0–2.5)
Troponin I: <0.3 ng/mL (ref <0.30)
Troponin I: <0.3 ng/mL (ref <0.30)

## 2012-03-24 MED ORDER — PHENAZOPYRIDINE HCL 100 MG PO TABS
100.0000 mg | ORAL_TABLET | Freq: Three times a day (TID) | ORAL | Status: DC
  Administered 2012-03-24 – 2012-03-27 (×8): 100 mg via ORAL
  Filled 2012-03-24 (×10): qty 1

## 2012-03-24 MED ORDER — SODIUM CHLORIDE 0.9 % IJ SOLN
INTRAMUSCULAR | Status: AC
  Administered 2012-03-24: 10 mL
  Filled 2012-03-24: qty 3

## 2012-03-24 MED ORDER — HYDROMORPHONE HCL PF 1 MG/ML IJ SOLN
0.5000 mg | INTRAMUSCULAR | Status: DC | PRN
  Administered 2012-03-24 – 2012-03-26 (×4): 0.5 mg via INTRAVENOUS
  Filled 2012-03-24 (×6): qty 1

## 2012-03-24 MED ORDER — ALBUTEROL SULFATE (5 MG/ML) 0.5% IN NEBU: 2.5000 mg | INHALATION_SOLUTION | RESPIRATORY_TRACT | Status: DC | PRN

## 2012-03-24 MED ORDER — ALUM & MAG HYDROXIDE-SIMETH 200-200-20 MG/5ML PO SUSP: 30.0000 mL | Freq: Four times a day (QID) | ORAL | Status: DC | PRN

## 2012-03-24 MED ORDER — PANTOPRAZOLE SODIUM 40 MG IV SOLR
40.0000 mg | INTRAVENOUS | Status: DC
  Administered 2012-03-24 – 2012-03-25 (×2): 40 mg via INTRAVENOUS
  Filled 2012-03-24: qty 40

## 2012-03-24 MED ORDER — SODIUM CHLORIDE 0.9 % IV SOLN: INTRAVENOUS | Status: AC

## 2012-03-24 MED ORDER — ONDANSETRON HCL 4 MG/2ML IJ SOLN: 4.0000 mg | Freq: Four times a day (QID) | INTRAMUSCULAR | Status: DC | PRN

## 2012-03-24 MED ORDER — SODIUM CHLORIDE 0.9 % IJ SOLN
INTRAMUSCULAR | Status: AC
  Administered 2012-03-24: 3 mL
  Filled 2012-03-24: qty 3

## 2012-03-24 MED ORDER — HYDRALAZINE HCL 20 MG/ML IJ SOLN: 10.0000 mg | Freq: Four times a day (QID) | INTRAMUSCULAR | Status: DC | PRN

## 2012-03-24 MED ORDER — LISINOPRIL 10 MG PO TABS
20.0000 mg | ORAL_TABLET | Freq: Every day | ORAL | Status: DC
  Administered 2012-03-24 – 2012-03-27 (×4): 20 mg via ORAL
  Filled 2012-03-24 (×5): qty 2

## 2012-03-24 MED ORDER — METOPROLOL TARTRATE 25 MG PO TABS
25.0000 mg | ORAL_TABLET | Freq: Two times a day (BID) | ORAL | Status: DC
  Administered 2012-03-24 – 2012-03-27 (×6): 25 mg via ORAL
  Filled 2012-03-24 (×9): qty 1

## 2012-03-24 MED ORDER — SODIUM CHLORIDE 0.9 % IJ SOLN
INTRAMUSCULAR | Status: AC
  Administered 2012-03-25
  Filled 2012-03-24: qty 3

## 2012-03-24 MED ORDER — ACETAMINOPHEN 650 MG RE SUPP: 650.0000 mg | Freq: Four times a day (QID) | RECTAL | Status: DC | PRN

## 2012-03-24 MED ORDER — ONDANSETRON HCL 4 MG PO TABS
4.0000 mg | ORAL_TABLET | Freq: Four times a day (QID) | ORAL | Status: DC | PRN
  Administered 2012-03-26: 4 mg via ORAL
  Filled 2012-03-24: qty 1

## 2012-03-24 MED ORDER — ACETAMINOPHEN 325 MG PO TABS
650.0000 mg | ORAL_TABLET | Freq: Four times a day (QID) | ORAL | Status: DC | PRN
  Administered 2012-03-25 – 2012-03-27 (×3): 650 mg via ORAL
  Filled 2012-03-24 (×3): qty 2

## 2012-03-24 NOTE — Plan of Care (Signed)
Problem: Phase I Progression Outcomes Goal: Voiding-avoid urinary catheter unless indicated Outcome: Progressing Pt having increased frequency in urination

## 2012-03-24 NOTE — Progress Notes (Signed)
UR Chart Review Completed  

## 2012-03-24 NOTE — Progress Notes (Signed)
   CARE MANAGEMENT NOTE 03/24/2012  Patient:  Tony Holt, Tony Holt   Account Number:  [id number]  Date Initiated:  03/24/2012  Documentation initiated by:  Sharrie Rothman  Subjective/Objective Assessment:   Pt admitted from home with abd pain. Pt lives alone and is independent with ADL's prior to getting sick. Pt son lives next door and daugher Boyd Kerbs lives close by. They check on pt frequently througout the day.     Action/Plan:   CM will follow for any CM needs at discharge.   Anticipated DC Date:  03/30/2012   Anticipated DC Plan:  HOME/SELF CARE      DC Planning Services  CM consult      Choice offered to / List presented to:             Status of service:  In process, will continue to follow Medicare Important Message given?   (If response is "NO", the following Medicare IM given date fields will be blank) Date Medicare IM given:   Date Additional Medicare IM given:    Discharge Disposition:  HOME/SELF CARE  Per UR Regulation:    If discussed at Long Length of Stay Meetings, dates discussed:    Comments:  03/24/12 1536 Arlyss Queen, RN BSN CM Pt daughter states that prior to pt getting sick he was able to work in his yard and garden. Pt has decreased appetite and therefore has gotten weak and dehydrated. Pt may benefit from [facility] at discharge if weakness persist.

## 2012-03-24 NOTE — H&P (Signed)
Tony Holt is an 77 y.o. male.    PCP: Fredirick Maudlin, MD, MD   Chief Complaint: Nausea, poor appetite  HPI: This is a 78 year old, Caucasian male, with a past medical history of hypertension who unfortunately, is a very poor historian. It was very difficult to get specific details out of this patient. He was accompanied by a one of his daughters. However, she also was not able to provide much details. Patient's main concern is that he's had lack of energy and very poor appetite for the last couple days. According to the daughter he has been nauseated for 2 days, but hasn't had any vomiting. When asked about pain, and he was very vague and, then he told me that he had upper abdominal pain with a lot of heartburn. He may have had chest pain, earlier today, but he's not sure. Denies any chest pain right now. Denies any fever or chills. No diarrhea. He has a headache. However patient feels that he is having flulike illness. His last bowel movement was a few days ago, which is common for him. Once, again, very poor historian, and unable to provide any kind of details.   Home Medications: Prior to Admission medications   Medication Sig Start Date End Date Taking? Authorizing Provider  metoprolol tartrate (LOPRESSOR) 25 MG tablet Take 25 mg by mouth every other day.    Yes Historical Provider, MD    Allergies: No Known Allergies  Past Medical History: Past Medical History  Diagnosis Date  . Hypertension     Past Surgical History  Procedure Date  . Hernia repair     Social History:  reports that he has never smoked. He does not have any smokeless tobacco history on file. He reports that he does not drink alcohol or use illicit drugs.  Family History: He tells me there is no medical problems in his family  Review of Systems -  unobtainable from patient due to age and mental status  Physical Examination Blood pressure 211/64, pulse 52, temperature 98.4 F (36.9 C), temperature  source Oral, resp. rate 20, height 5\' 8"  (1.727 m), weight 54.432 kg (120 lb), SpO2 95.00%. Repeat BP was 160's /70's  General appearance: alert, cooperative, distracted and no distress Head: Normocephalic, without obvious abnormality, atraumatic Eyes: conjunctivae/corneas clear. PERRL, EOM's intact.  Throat: lips, mucosa, and tongue normal; teeth and gums normal Neck: no adenopathy, no carotid bruit, no JVD, supple, symmetrical, trachea midline and thyroid not enlarged, symmetric, no tenderness/mass/nodules Back: symmetric, no curvature. ROM normal. No CVA tenderness. Resp: clear to auscultation bilaterally Cardio: regular rate and rhythm, S1, S2 normal, no murmur, click, rub or gallop GI: soft, non-tender; bowel sounds normal; no masses,  no organomegaly Extremities: extremities normal, atraumatic, no cyanosis or edema Pulses: 2+ and symmetric Skin: Skin color, texture, turgor normal. No rashes or lesions Lymph nodes: Cervical, supraclavicular, and axillary nodes normal. Neurologic: Grossly normal  Laboratory Data: Results for orders placed during the hospital encounter of 03/23/12 (from the past 48 hour(s))  CBC     Status: Abnormal   Collection Time   03/23/12  8:32 PM      Component Value Range Comment   WBC 5.8  4.0 - 10.5 (K/uL)    RBC 4.31  4.22 - 5.81 (MIL/uL)    Hemoglobin 13.3  13.0 - 17.0 (g/dL)    HCT 04.5  40.9 - 81.1 (%)    MCV 92.3  78.0 - 100.0 (fL)    MCH 30.9  26.0 - 34.0 (pg)    MCHC 33.4  30.0 - 36.0 (g/dL)    RDW 45.4  09.8 - 11.9 (%)    Platelets 111 (*) 150 - 400 (K/uL)   DIFFERENTIAL     Status: Abnormal   Collection Time   03/23/12  8:32 PM      Component Value Range Comment   Neutrophils Relative 79 (*) 43 - 77 (%)    Neutro Abs 4.6  1.7 - 7.7 (K/uL)    Lymphocytes Relative 10 (*) 12 - 46 (%)    Lymphs Abs 0.6 (*) 0.7 - 4.0 (K/uL)    Monocytes Relative 11  3 - 12 (%)    Monocytes Absolute 0.7  0.1 - 1.0 (K/uL)    Eosinophils Relative 0  0 - 5 (%)     Eosinophils Absolute 0.0  0.0 - 0.7 (K/uL)    Basophils Relative 0  0 - 1 (%)    Basophils Absolute 0.0  0.0 - 0.1 (K/uL)   COMPREHENSIVE METABOLIC PANEL     Status: Abnormal   Collection Time   03/23/12  8:32 PM      Component Value Range Comment   Sodium 134 (*) 135 - 145 (mEq/L)    Potassium 4.4  3.5 - 5.1 (mEq/L)    Chloride 98  96 - 112 (mEq/L)    CO2 25  19 - 32 (mEq/L)    Glucose, Bld 101 (*) 70 - 99 (mg/dL)    BUN 15  6 - 23 (mg/dL)    Creatinine, Ser 1.47  0.50 - 1.35 (mg/dL)    Calcium 9.6  8.4 - 10.5 (mg/dL)    Total Protein 6.9  6.0 - 8.3 (g/dL)    Albumin 4.0  3.5 - 5.2 (g/dL)    AST 17  0 - 37 (U/L) SLIGHT HEMOLYSIS   ALT 7  0 - 53 (U/L)    Alkaline Phosphatase 53  39 - 117 (U/L)    Total Bilirubin 0.4  0.3 - 1.2 (mg/dL)    GFR calc non Af Amer 75 (*) >90 (mL/min)    GFR calc Af Amer 87 (*) >90 (mL/min)   LIPASE, BLOOD     Status: Normal   Collection Time   03/23/12  8:32 PM      Component Value Range Comment   Lipase 17  11 - 59 (U/L)   URINALYSIS, ROUTINE W REFLEX MICROSCOPIC     Status: Abnormal   Collection Time   03/23/12  8:41 PM      Component Value Range Comment   Color, Urine YELLOW  YELLOW     APPearance CLEAR  CLEAR     Specific Gravity, Urine 1.025  1.005 - 1.030     pH 6.0  5.0 - 8.0     Glucose, UA NEGATIVE  NEGATIVE (mg/dL)    Hgb urine dipstick SMALL (*) NEGATIVE     Bilirubin Urine NEGATIVE  NEGATIVE     Ketones, ur 15 (*) NEGATIVE (mg/dL)    Protein, ur TRACE (*) NEGATIVE (mg/dL)    Urobilinogen, UA 0.2  0.0 - 1.0 (mg/dL)    Nitrite NEGATIVE  NEGATIVE     Leukocytes, UA NEGATIVE  NEGATIVE    URINE MICROSCOPIC-ADD ON     Status: Normal   Collection Time   03/23/12  8:41 PM      Component Value Range Comment   Squamous Epithelial / LPF RARE  RARE     WBC, UA 0-2  <3 (  WBC/hpf)    RBC / HPF 3-6  <3 (RBC/hpf)    Bacteria, UA RARE  RARE     Urine-Other MUCOUS PRESENT   RARE YEAST    Radiology Reports: Ct Abdomen Pelvis W  Contrast  03/23/2012  *RADIOLOGY REPORT*  Clinical Data: Moderate abdominal pain for 3 days; nausea. Umbilical pain and weakness.  Decreased appetite noted.  CT ABDOMEN AND PELVIS WITH CONTRAST  Technique:  Multidetector CT imaging of the abdomen and pelvis was performed following the standard protocol during bolus administration of intravenous contrast.  Contrast: OMNIPAQUE IOHEXOL 300 MG/ML  SOLN  Comparison: CT of the abdomen and pelvis performed 02/26 1012  Findings: Mild right basilar atelectasis or scarring is noted.  There is distension of the gallbladder, with mild diffuse intrahepatic biliary ductal distension, and dilatation of the common hepatic duct to 1.1 cm.  A stone is suggested within the distal common hepatic duct on coronal images; findings raise suspicion for a more distal obstructing stone.  No significant pericholecystic fluid or gallbladder wall thickening is seen to suggest cholecystitis.  The liver and spleen are unremarkable in appearance.  The gallbladder is within normal limits.  The pancreas and adrenal glands are unremarkable.  Scattered bilateral renal cysts are noted, measuring up to 5.4 cm in size.  Mild nonspecific perinephric stranding is noted bilaterally.  The kidneys are otherwise unremarkable in appearance. There is no evidence of hydronephrosis.  No renal or ureteral stones are seen.  No free fluid is identified.  The small bowel is unremarkable in appearance.  A tiny hiatal hernia is noted; the stomach is otherwise unremarkable in appearance.  No acute vascular abnormalities are seen.  Mild scattered calcification is noted along the abdominal aorta and its branches.  The appendix these difficult to fully characterize but appears grossly normal in caliber; there is no evidence for appendicitis. The colon is partially filled with stool and grossly unremarkable in appearance.  Scattered postoperative change is noted along the distal transverse and descending colon.  The  bladder is mildly distended and grossly unremarkable in appearance.  Minimal calcification is noted dependently within the bladder, possibly reflecting recently passed tiny stones, as it appears new from the prior study.  The prostate is borderline normal in size.  No inguinal lymphadenopathy is seen.  Mild soft tissue inflammation and trace fluid noted along the right inguinal canal, of uncertain significance but stable from the prior study.  No acute osseous abnormalities are identified.  Mild vacuum phenomenon is noted at T12-L1.  IMPRESSION:  1.  Distension of the gallbladder, with mild diffuse intrahepatic biliary ductal dilatation and dilatation of the common hepatic duct to 1.1 cm in diameter.  The intrahepatic biliary ductal dilatation was less prominent on the prior study, and the distal duct dilatation is new.  A small stone is suggested within the distal common hepatic duct, and findings raise suspicion for an obstructing more distal stone.  MRCP or ERCP could be considered for further evaluation, as deemed clinically appropriate. 2.  Scattered bilateral renal cysts noted. 3.  Mild scattered calcification along the abdominal aorta and its branches. 4.  Minimal calcification dependently within the bladder may reflect recently passed tiny stones, as it appears new from the prior study. 5.  Mild soft tissue inflammation and trace fluid along the right inguinal canal, of uncertain significance but stable from the prior study. 6.  Mild right basilar atelectasis or scarring noted. 7.  Tiny hiatal hernia seen.  Original Report Authenticated  By: JEFFREY Cherly Hensen, M.D.    Electrocardiogram: Not done yet  Assessment/Plan  Principal Problem:  *Abdominal pain, epigastric Active Problems:  Nausea alone  HTN (hypertension)   #1 abdominal pain, and nausea: Etiology remains unclear. CT scan suggested that this could be a biliary process. However, his liver function tests are completely normal. He does not  really have any form of tenderness in the right upper quadrant at this time. Can't rule out cardiac etiology, so, we'll get a cardiac panel and EKG. If those are negative patient will be admitted to the hospital and will be seen by gastroenterology in the morning. In the, meantime, will give him proton pump inhibitors.  #2 hypertension: Poorly controlled. He supposed to take twice daily, metoprolol, but he's only taking it every other day. We'll put him on twice a day, metoprolol for now.  #3. Mild thrombocytopenia: Monitor closely.  Other nonspecific abnormalities in the CT scan can be addressed by his PCP.  He is a full code. DVT prophylaxis will be initiated.  Dr. Juanetta Gosling will assume care of this patient in the morning.  [facility]  [facility] Pager [phone]  03/24/2012, 12:16 AM

## 2012-03-24 NOTE — Progress Notes (Signed)
[name], [name]               ACCOUNT NO.:  [id number]  MEDICAL RECORD NO.:  [id number]  LOCATION:  A335                          FACILITY:  [facility]  PHYSICIAN:  Tony Holt, M.D.DATE OF BIRTH:  12-17-1925  DATE OF PROCEDURE: DATE OF DISCHARGE:                                PROGRESS NOTE   Tony Holt says that he feels like he has got the flu.  He was also found to have uncontrolled blood pressure.  His CT showed that he had significant abnormalities in the area of the gallbladder and liver, but he does not have any tenderness in the right upper quadrant and his liver blood tests are normal.  He had trouble not being able to urinate, so he has a Foley catheter in place now.  PHYSICAL EXAMINATION:  VITAL SIGNS:  His temperature is 99.2, pulse 56, respirations 18, blood pressure 195/71, O2 sats 96%. CHEST:  Clear. HEART:  Regular without gallop. ABDOMEN:  Soft.  He does not have any right upper quadrant tenderness. EXTREMITIES:  No edema.  ASSESSMENT:  It is not totally clear what he has.  He is complaining of discomfort from the Foley catheter and I am going to try to put him on some Pyridium and see if we can get him some relief.  He needs more blood pressure control.  He will have GI consultation.  I did not change any of his other medications.  He will be reevaluated after his GI consultation.     Tony Holt L. Juanetta Holt, M.D.     ELH/MEDQ  D:  03/24/2012  T:  03/24/2012  Job:  [id number]

## 2012-03-24 NOTE — Consult Note (Signed)
Referring Provider: Fredirick Maudlin, MD Primary Care Physician:  Fredirick Maudlin, MD, MD Primary Gastroenterologist:  Roetta Sessions, MD  Reason for Consultation:  Abnormal CT, abdominal pain  HPI: Tony Holt is a 78 y.o. male admitted for vague complaints of poor appetite, nausea, fatigue. He is somewhat of a poor historian. His son is at bedside. For about the past week he's had significant fatigue, diffuse body aches. No documented fever. He felt like he had the flu. He's had some vague periumbilical, pain. Essentially been unable to eat for about a week. No bowel movement in one week in the setting of chronic constipation. He vomited twice. He complains of bad heartburn and esophageal dysphagia to solid foods. Denies melena or rectal bleeding. No hematemesis.  We met this gentleman several years ago when he presented with a bowel obstruction due to an incarcerated hernia. He had had a drop in hemoglobin. We saw him as an outpatient and scheduled him for a colonoscopy in 08/2010 but patient cancelled due to fall and never rescheduled. Patient's past medical history significant for sigmoid colon cancer status post segmental resection in 1999. His last surveillance colonoscopy was in 2002 at which time he had numerous adenomatous polyps.  He had a CT of the abdomen and pelvis this admission. There is distention of the gallbladder, mild diffuse intrahepatic biliary ductal distention, dilation of the common hepatic duct to 1.1 cm. A stone is suggested within the distal common hepatic duct, findings raise suspicion for a more distal obstructing stone. Nothing to suggest cholecystitis at this point. He also has minimal calcification noted dependently within the bladder, possibly reflecting recently passed tiny stones. Intrahepatic biliary ductal dilation was less prominent on the prior study and the distal duct dilatation is a new finding.   Notably, in August 2011, this gentleman had a CT of the  abdomen and pelvis. Intrahepatic biliary dilation with common bile duct measuring 10 mm at the head of the pancreas. No stones noted then. Abdominal ultrasound showed small amount of sludge, no stones. Common bile duct measures 8mm, mild intrahepatic dilation. Filling defect visualized in the distal common bile duct suspicious for choledocholithiasis. MRCP was then done. There was no evidence of common duct stone. No obstructing mass lesion in the region of the ampulla. Dependent layering gallbladder sludge noted. Borderline to mild distention of the common duct.  The patient has had normal LFTs in August of 2011, February 2012, and currently.  Prior to Admission medications   Medication Sig Start Date End Date Taking? Authorizing Provider  metoprolol tartrate (LOPRESSOR) 25 MG tablet Take 25 mg by mouth every other day.    Yes Historical Provider, MD    Current Facility-Administered Medications  Medication Dose Route Frequency Provider Last Rate Last Dose  . 0.9 %  sodium chloride infusion   Intravenous Continuous Osvaldo Shipper, MD 100 mL/hr at 03/24/12 0127    . acetaminophen (TYLENOL) tablet 650 mg  650 mg Oral Q6H PRN Osvaldo Shipper, MD       Or  . acetaminophen (TYLENOL) suppository 650 mg  650 mg Rectal Q6H PRN Osvaldo Shipper, MD      . albuterol (PROVENTIL) (5 MG/ML) 0.5% nebulizer solution 2.5 mg  2.5 mg Nebulization Q2H PRN Osvaldo Shipper, MD      . alum & mag hydroxide-simeth (MAALOX/MYLANTA) 200-200-20 MG/5ML suspension 30 mL  30 mL Oral Q6H PRN Osvaldo Shipper, MD      . gi cocktail (Maalox,Lidocaine,Donnatal)  30 mL Oral Once Gokul  Rito Ehrlich, MD   30 mL at 03/24/12 0008  . hydrALAZINE (APRESOLINE) injection 10 mg  10 mg Intravenous Q6H PRN Osvaldo Shipper, MD      . HYDROmorphone (DILAUDID) injection 0.5 mg  0.5 mg Intravenous Once Donnetta Hutching, MD   0.5 mg at 03/23/12 2048  . HYDROmorphone (DILAUDID) injection 0.5 mg  0.5 mg Intravenous Q4H PRN Osvaldo Shipper, MD   0.5 mg at 03/24/12  0558  . iohexol (OMNIPAQUE) 300 MG/ML solution 100 mL  100 mL Intravenous Once PRN Donnetta Hutching, MD   100 mL at 03/23/12 2225  . lisinopril (PRINIVIL,ZESTRIL) tablet 20 mg  20 mg Oral Daily Fredirick Maudlin, MD   20 mg at 03/24/12 1017  . metoprolol tartrate (LOPRESSOR) tablet 25 mg  25 mg Oral BID Osvaldo Shipper, MD   25 mg at 03/24/12 1018  . ondansetron (ZOFRAN) injection 4 mg  4 mg Intravenous Once Donnetta Hutching, MD   4 mg at 03/23/12 2047  . ondansetron (ZOFRAN) tablet 4 mg  4 mg Oral Q6H PRN Osvaldo Shipper, MD       Or  . ondansetron (ZOFRAN) injection 4 mg  4 mg Intravenous Q6H PRN Osvaldo Shipper, MD      . pantoprazole (PROTONIX) injection 40 mg  40 mg Intravenous Once Osvaldo Shipper, MD   40 mg at 03/24/12 0008  . pantoprazole (PROTONIX) injection 40 mg  40 mg Intravenous Q24H Osvaldo Shipper, MD   40 mg at 03/24/12 0150  . phenazopyridine (PYRIDIUM) tablet 100 mg  100 mg Oral TID WC Fredirick Maudlin, MD      . sodium chloride 0.9 % bolus 500 mL  500 mL Intravenous Once Donnetta Hutching, MD   500 mL at 03/23/12 2048  . sodium chloride 0.9 % injection        3 mL at 03/24/12 1018  . DISCONTD: 0.9 %  sodium chloride infusion   Intravenous Continuous Donnetta Hutching, MD        Allergies as of 03/23/2012  . (No Known Allergies)    Past Medical History  Diagnosis Date  . Hypertension   . Emphysema     Past Surgical History  Procedure Date  . Hernia repair 02/2011    right inguinal hernia repair with mesh, Dr. Leticia Penna  . Colon surgery 1999    sigmoid colon cancer, segmental resection by Drs. Smith/Bradford  . Colonoscopy 2002    multiple adenomatous colon polyps    Family History  Problem Relation Age of Onset  . Heart attack Father     deceased, age 84s  . Colon cancer Neg Hx   . Liver disease Neg Hx     History   Social History  . Marital Status: Widowed    Spouse Name: N/A    Number of Children: 5  . Years of Education: N/A   Occupational History  . retired Medical laboratory scientific officer     Social History Main Topics  . Smoking status: Former Games developer  . Smokeless tobacco: Current User    Types: Chew  . Alcohol Use: No     previous heavy drinker at age 75-32  . Drug Use: No  . Sexually Active: Not on file   Other Topics Concern  . Not on file   Social History Narrative  . No narrative on file     ROS:  General: Positive for recent anorexia, fatigue, body aches. Negative for weight loss, fever, chills. Eyes: Negative for vision changes.  ENT: Negative for  hoarseness,  nasal congestion. CV: Negative for chest pain, angina, palpitations, dyspnea on exertion, peripheral edema.  Respiratory: Negative for dyspnea at rest, dyspnea on exertion, cough, sputum, wheezing.  GI: See history of present illness. GU:  Negative for dysuria, hematuria, urinary incontinence, urinary frequency, nocturnal urination. C/o urinary hesitancy. C/O irritation from foley catheter. MS: Negative for joint pain. C/o body aches and back pain. Derm: Negative for rash or itching.  Neuro: Negative for weakness, abnormal sensation, seizure, frequent headaches, memory loss, confusion.  Psych: Negative for anxiety, depression, suicidal ideation, hallucinations.  Endo: Negative for unusual weight change.  Heme: Negative for bruising or bleeding. Allergy: Negative for rash or hives.       Physical Examination: Vital signs in last 24 hours: Temp:  [97.8 F (36.6 C)-99.2 F (37.3 C)] 99.2 F (37.3 C) (04/12 0118) Pulse Rate:  [51-56] 54  (04/12 0928) Resp:  [18-20] 18  (04/12 0118) BP: (195-211)/(64-71) 195/71 mmHg (04/12 0118) SpO2:  [93 %-100 %] 93 % (04/12 0928) Weight:  [120 lb (54.432 kg)-126 lb 15.8 oz (57.6 kg)] 126 lb 15.8 oz (57.6 kg) (04/12 0118) Last BM Date: 03/20/12  General: Elderly, thin WM in NAD. Son at bedside. Somewhat hard of hearing.  Head: Normocephalic, atraumatic.   Eyes: Conjunctiva pink, no icterus. Mouth: Oropharyngeal mucosa moist and pink , no lesions erythema or  exudate. Neck: Supple without thyromegaly, masses, or lymphadenopathy.  Lungs: Clear to auscultation bilaterally. Barrel chest. Heart: Regular rate and rhythm, no murmurs rubs or gallops.  Abdomen: Bowel sounds are normal, nondistended, no hepatosplenomegaly or masses, no abdominal bruits or    hernia , no rebound or guarding.  Mild periumbilical tenderness. Rectal: not performed Extremities: No lower extremity edema, clubbing, deformity.  Neuro: Alert and oriented x 4 , grossly normal neurologically.  Skin: Warm and dry, no rash or jaundice.   Psych: Alert and cooperative, normal mood and affect.        Intake/Output from previous day:   Intake/Output this shift: Total I/O In: 968.3 [I.V.:968.3] Out: -   Lab Results: CBC  Basename 03/24/12 0619 03/23/12 2032  WBC 4.4 5.8  HGB 12.5* 13.3  HCT 37.4* 39.8  MCV 91.7 92.3  PLT 109* 111*   BMET  Basename 03/24/12 0619 03/23/12 2032  NA 132* 134*  K 4.2 4.4  CL 99 98  CO2 25 25  GLUCOSE 93 101*  BUN 13 15  CREATININE 0.86 0.95  CALCIUM 8.6 9.6   LFT  Basename 03/24/12 0619 03/23/12 2032  BILITOT 0.3 0.4  BILIDIR -- --  IBILI -- --  ALKPHOS 49 53  AST 14 17  ALT 6 7  PROT 6.2 6.9  ALBUMIN 3.7 4.0   Lab Results  Component Value Date   LIPASE 17 03/23/2012    PT/INR No results found for this basename: LABPROT:3,INR:3 in the last 72 hours    Imaging Studies: Ct Abdomen Pelvis W Contrast  03/23/2012  *RADIOLOGY REPORT*  Clinical Data: Moderate abdominal pain for 3 days; nausea. Umbilical pain and weakness.  Decreased appetite noted.  CT ABDOMEN AND PELVIS WITH CONTRAST  Technique:  Multidetector CT imaging of the abdomen and pelvis was performed following the standard protocol during bolus administration of intravenous contrast.  Contrast: OMNIPAQUE IOHEXOL 300 MG/ML  SOLN  Comparison: CT of the abdomen and pelvis performed 02/26 1012  Findings: Mild right basilar atelectasis or scarring is noted.  There is  distension of the gallbladder, with mild diffuse intrahepatic biliary ductal  distension, and dilatation of the common hepatic duct to 1.1 cm.  A stone is suggested within the distal common hepatic duct on coronal images; findings raise suspicion for a more distal obstructing stone.  No significant pericholecystic fluid or gallbladder wall thickening is seen to suggest cholecystitis.  The liver and spleen are unremarkable in appearance.  The gallbladder is within normal limits.  The pancreas and adrenal glands are unremarkable.  Scattered bilateral renal cysts are noted, measuring up to 5.4 cm in size.  Mild nonspecific perinephric stranding is noted bilaterally.  The kidneys are otherwise unremarkable in appearance. There is no evidence of hydronephrosis.  No renal or ureteral stones are seen.  No free fluid is identified.  The small bowel is unremarkable in appearance.  A tiny hiatal hernia is noted; the stomach is otherwise unremarkable in appearance.  No acute vascular abnormalities are seen.  Mild scattered calcification is noted along the abdominal aorta and its branches.  The appendix these difficult to fully characterize but appears grossly normal in caliber; there is no evidence for appendicitis. The colon is partially filled with stool and grossly unremarkable in appearance.  Scattered postoperative change is noted along the distal transverse and descending colon.  The bladder is mildly distended and grossly unremarkable in appearance.  Minimal calcification is noted dependently within the bladder, possibly reflecting recently passed tiny stones, as it appears new from the prior study.  The prostate is borderline normal in size.  No inguinal lymphadenopathy is seen.  Mild soft tissue inflammation and trace fluid noted along the right inguinal canal, of uncertain significance but stable from the prior study.  No acute osseous abnormalities are identified.  Mild vacuum phenomenon is noted at T12-L1.   IMPRESSION:  1.  Distension of the gallbladder, with mild diffuse intrahepatic biliary ductal dilatation and dilatation of the common hepatic duct to 1.1 cm in diameter.  The intrahepatic biliary ductal dilatation was less prominent on the prior study, and the distal duct dilatation is new.  A small stone is suggested within the distal common hepatic duct, and findings raise suspicion for an obstructing more distal stone.  MRCP or ERCP could be considered for further evaluation, as deemed clinically appropriate. 2.  Scattered bilateral renal cysts noted. 3.  Mild scattered calcification along the abdominal aorta and its branches. 4.  Minimal calcification dependently within the bladder may reflect recently passed tiny stones, as it appears new from the prior study. 5.  Mild soft tissue inflammation and trace fluid along the right inguinal canal, of uncertain significance but stable from the prior study. 6.  Mild right basilar atelectasis or scarring noted. 7.  Tiny hiatal hernia seen.  Original Report Authenticated By: Tonia Ghent, M.D.  Pierre.Alas week]   Impression: 78 y/o WM with vague generalized complaints of fatigue, anorexia, nausea. Very limited abdominal pain. Unable to eat for several days due to nausea. Last BM one week. CT shows more prominent intrahepatic biliary ductal dilatation and new distal duct dilatation. ?small stone suggested within distal common hepatic duct and ?obstructing more distal stone. Findings in 07/2010 as noted above. MRCP negative for stone at that time. LFTs have been normal throughout. Patient without significant abdominal pain, these findings could be incidental to current symptomatology. He may have more generalized complaints if low-grade cholangitis present.   Plan: To discuss with Dr. Jena Gauss. I believe this patient needs repeat MRCP at this point. Without abnormal LFTs, ERCP not indicated. May consider IV Cipro to cover for cholangitis  while sorting out issues.   As aside,  patient way over due for surveillance colonoscopy given his h/o sigmoid colon cancer.   I would like to thank Dr. Juanetta Gosling for allowing [place] to take part in the care of this nice patient.    LOS: 1 day   Tana Coast  03/24/2012, 11:35 AM  Pt seen and examined; agree with above assessment and recommendations.  MRCP performed today. I reviewed the study with Dr. Kearney Hard.  Study somewhat degraded with motion artifact but it reveals a stable dilated biliary tree without stricture or obvious filling defect. LFTs normal. GI symptoms are mainly that of refractory heartburn and early satiety in the setting of weight loss. He also complains to me of significant increased urinary frequency and sensation that his urinary bladder is not emptying properly-likely a separate issue.  He may benefit from urology evaluation.  I recommended a diagnostic EGD.The risks, benefits, limitations, alternatives and imponderables have been reviewed with the patient. Potential for esophageal dilation, biopsy, etc. have also been reviewed.  Questions have been answered. All parties agreeable.We'll plan for perform EGD tomorrow morning. Further recommendations to follow.

## 2012-03-25 ENCOUNTER — Encounter (HOSPITAL_COMMUNITY)
Admission: EM | Disposition: A | Discharge status: Home / Self Care | Payer: Self-pay | Source: Home / Self Care | Attending: Pulmonary Disease

## 2012-03-25 ENCOUNTER — Encounter (HOSPITAL_COMMUNITY): Payer: Self-pay | Admitting: *Deleted

## 2012-03-25 HISTORY — PX: ESOPHAGOGASTRODUODENOSCOPY: SHX5428

## 2012-03-25 SURGERY — EGD (ESOPHAGOGASTRODUODENOSCOPY)
Anesthesia: Moderate Sedation | Laterality: Left

## 2012-03-25 MED ORDER — MIDAZOLAM HCL 5 MG/5ML IJ SOLN
INTRAMUSCULAR | Status: AC
  Filled 2012-03-25: qty 10

## 2012-03-25 MED ORDER — PANTOPRAZOLE SODIUM 40 MG IV SOLR
40.0000 mg | Freq: Two times a day (BID) | INTRAVENOUS | Status: DC
  Administered 2012-03-25 – 2012-03-27 (×5): 40 mg via INTRAVENOUS
  Filled 2012-03-25 (×6): qty 40

## 2012-03-25 MED ORDER — SODIUM CHLORIDE 0.9 % IJ SOLN
INTRAMUSCULAR | Status: AC
  Administered 2012-03-25: 10 mL
  Filled 2012-03-25: qty 3

## 2012-03-25 MED ORDER — SODIUM CHLORIDE 0.9 % IJ SOLN
INTRAMUSCULAR | Status: AC
  Administered 2012-03-25: 3 mL
  Filled 2012-03-25: qty 3

## 2012-03-25 MED ORDER — MIDAZOLAM HCL 5 MG/5ML IJ SOLN
INTRAMUSCULAR | Status: DC | PRN
  Administered 2012-03-25: 1 mg via INTRAVENOUS

## 2012-03-25 MED ORDER — BUTAMBEN-TETRACAINE-BENZOCAINE 2-2-14 % EX AERO
INHALATION_SPRAY | CUTANEOUS | Status: DC | PRN
  Administered 2012-03-25: 2 via TOPICAL

## 2012-03-25 MED ORDER — MEPERIDINE HCL 50 MG/ML IJ SOLN
INTRAMUSCULAR | Status: AC
  Filled 2012-03-25: qty 1

## 2012-03-25 MED ORDER — MEPERIDINE HCL 100 MG/ML IJ SOLN
INTRAMUSCULAR | Status: DC | PRN
  Administered 2012-03-25: 25 mg via INTRAVENOUS

## 2012-03-25 NOTE — Progress Notes (Signed)
Patient complain of upper abdominal pain, was given  Dilaudid 0.5mg   IV

## 2012-03-25 NOTE — Op Note (Signed)
[facility] [address] [place], [place]  [address]  ENDOSCOPY PROCEDURE REPORT  PATIENT:  Tony Holt, Tony Holt  MR#:  [id number] BIRTHDATE:  04/26/1926, 85 yrs. old  GENDER:  male  ENDOSCOPIST:  R. Roetta Sessions, MD Caleen Essex Referred by:  Kari Baars, M.D.  PROCEDURE DATE:  03/25/2012 PROCEDURE:  EGD with gastric biopsy  INDICATIONS:   Daily heartburn and early satiety  INFORMED CONSENT:   The risks, benefits, limitations, alternatives and imponderables have been discussed.  The potential for biopsy, esophogeal dilation, etc. have also been reviewed.  Questions have been answered.  All parties agreeable.  Please see the history and physical in the medical record for more information.  MEDICATIONS:    Demerol 25 mg IV and Versed 1 mg IV in divided dose. Cetacaine spray.  DESCRIPTION OF PROCEDURE:   The EG-[id number] ([id number]) endoscope was introduced through the mouth and advanced to the second portion of the duodenum without difficulty or limitations.  The mucosal surfaces were surveyed very carefully during advancement of the scope and upon withdrawal.  Retroflexion view of the proximal stomach and esophagogastric junction was performed.  <<PROCEDUREIMAGES>>  FINDINGS:  Severe inflammatory changes of the esophageal mucosa including extensive "geographic" ulceration and erosions; no Barrett's esophagus. No tumor. Inflammatory changes extending half way up the tubular esophagus.  Small hiatal hernia. Deformity of the antrum and prepyloric mucosa. No ulcer or infiltrating process. Diffuse mosaicism with submucosal petechiae and erosions of the gastric mucosa. Pylorus patent. Bulbar erosions present; otherwise normal-appearing first and second portion of the duodenum.  THERAPEUTIC / DIAGNOSTIC MANEUVERS PERFORMED:  Biopsies the gastric mucosa taken to assess for H. pylori.  COMPLICATIONS:   None  IMPRESSION:   Severe ulcerative / erosive reflux esophagitis. Small  hiatal hernia. Abnormal gastric and duodenum mucosa-status post gastric                   biopsy.  RECOMMENDATIONS:    Twice a day PPI therapy. Advance diet. Followup on pathology. Consider urology consultation for urinary tract symptoms and hematuria.  ______________________________ R. Roetta Sessions, MD Caleen Essex  CC:  n. eSIGNED:   R. Roetta Sessions at 03/25/2012 09:37 AM  Rubye Oaks, [phone]

## 2012-03-26 MED ORDER — TAMSULOSIN HCL 0.4 MG PO CAPS
0.4000 mg | ORAL_CAPSULE | Freq: Every day | ORAL | Status: DC
  Administered 2012-03-26 – 2012-03-27 (×2): 0.4 mg via ORAL
  Filled 2012-03-26 (×2): qty 1

## 2012-03-26 MED ORDER — SODIUM CHLORIDE 0.9 % IJ SOLN
INTRAMUSCULAR | Status: AC
  Administered 2012-03-26: 10 mL
  Filled 2012-03-26: qty 3

## 2012-03-26 NOTE — Progress Notes (Signed)
NAMELESTER, CRICKENBERGER               ACCOUNT NO.:  [id number]  MEDICAL RECORD NO.:  [id number]  LOCATION:  A335                          FACILITY:  [facility]  PHYSICIAN:  Bently Morath L. Juanetta Gosling, M.D.DATE OF BIRTH:  1926/03/31  DATE OF PROCEDURE: DATE OF DISCHARGE:                                PROGRESS NOTE   SUBJECTIVE:  Mr. Maceachern was admitted with abdominal discomfort.  He had a MRCP which did not show anything definite.  He is going to undergo a EGD today.  It is not totally clear that he has any sort of liver problem going on.  PHYSICAL EXAMINATION:  GENERAL:  He is sedated for this procedure. CHEST:  Relatively clear. ABDOMEN:  Soft. EXTREMITIES:  No edema. CENTRAL NERVOUS SYSTEM:  Grossly intact.  ASSESSMENT:  He has abdominal discomfort.  He may have something like an ulcer or reflux.  He has a lot of aching and some viral sounding problems and he is to undergo EGD.  I did not change any of his medicines right now until we have results.     Vince Ainsley L. Juanetta Gosling, M.D.     ELH/MEDQ  D:  03/25/2012  T:  03/26/2012  Job:  [id number]

## 2012-03-26 NOTE — Progress Notes (Signed)
On assessment, patients blood pressure was 193/73 and heart rate was 53 apically. Patient was in stable condition with no signs of distress. Notified Dr. Janna Arch of heart rate before administering medications. Dr. Janna Arch verbalized to go ahead and administer the lisinopril and metoprolol. Will continue to monitor patient.

## 2012-03-27 DIAGNOSIS — K208 Other esophagitis: Secondary | ICD-10-CM

## 2012-03-27 MED ORDER — OMEPRAZOLE 20 MG PO CPDR: 20.0000 mg | DELAYED_RELEASE_CAPSULE | Freq: Two times a day (BID) | ORAL | Status: DC

## 2012-03-27 MED ORDER — SODIUM CHLORIDE 0.9 % IJ SOLN
INTRAMUSCULAR | Status: AC
  Administered 2012-03-27: 10 mL
  Filled 2012-03-27: qty 3

## 2012-03-27 MED ORDER — TAMSULOSIN HCL 0.4 MG PO CAPS: 0.4000 mg | ORAL_CAPSULE | Freq: Every day | ORAL | Status: DC

## 2012-03-27 MED ORDER — LISINOPRIL 20 MG PO TABS: 20.0000 mg | ORAL_TABLET | Freq: Every day | ORAL | Status: DC

## 2012-03-27 NOTE — Progress Notes (Signed)
Subjective: Patient is doing well. He denies any abdominal pain, heartburn, or indigestion. He denies any nausea or vomiting. He is eating well. He is "ready to go home".  Objective: Vital signs in last 24 hours: Temp:  [98 F (36.7 C)-98.1 F (36.7 C)] 98.1 F (36.7 C) (04/15 0518) Pulse Rate:  [42-52] 42  (04/15 0518) Resp:  [18-20] 20  (04/15 0518) BP: (154-193)/(64-73) 158/73 mmHg (04/15 0518) SpO2:  [91 %-95 %] 94 % (04/15 0518) Last BM Date: 03/26/12 General:   Alert,  thin, pleasant and cooperative in NAD.  Son at bedside. Eyes:  Sclera clear, no icterus.   Conjunctiva pink. Mouth:  oropharynx pink & moist. Neck:  Supple; no masses or thyromegaly. Heart:  Regular rate and rhythm; no murmurs, clicks, rubs,  or gallops. Abdomen:  Soft, nontender and nondistended. No masses, hepatosplenomegaly or hernias noted. Normal bowel sounds, without guarding, and without rebound.   Msk:  Symmetrical without gross deformities. Normal posture. Extremities:  Without edema. Neurologic:  Alert and  oriented x4;  grossly normal neurologically. Skin:  Intact without significant lesions or rashes. Psych:  Alert and cooperative. Normal mood and affect.  Intake/Output from previous day: 04/14 0701 - 04/15 0700 In: 380 [P.O.:360; I.V.:20] Out: 550 [Urine:550]  Assessment: Ulcerative/erosive esophagitis: on twice a day PPI.  Plan: 1. Follow up biopsies 2. Continue PPI twice a day  LOS: 4 days   Lorenza Burton  03/27/2012, 9:25 AM

## 2012-03-27 NOTE — Progress Notes (Signed)
NAMEJAQUEZ, FARRINGTON               ACCOUNT NO.:  [id number]  MEDICAL RECORD NO.:  [id number]  LOCATION:                                 FACILITY:  PHYSICIAN:  Vonna Brabson L. Juanetta Gosling, M.D.DATE OF BIRTH:  Oct 18, 1926  DATE OF PROCEDURE:  03/26/2012 DATE OF DISCHARGE:                                PROGRESS NOTE   Mr. Frampton underwent an EGD yesterday, and he has pretty severe esophagitis.  He has no other new complaints.  He says he feels a little bit better, but he is still having some aching all over and some difficulty with swallowing and he is complaining about the fact that he has a Foley catheter in.  PHYSICAL EXAMINATION:  GENERAL:  He is awake and alert.  He looks fairly comfortable. CHEST:  Clear. HEART:  Regular. ABDOMEN:  Soft. GENITOURINARY:  He does have a Foley catheter in place.  ASSESSMENT:  He has pretty severe esophagitis.  He had a flu-like illness.  He has abnormalities in his biliary system that did not appear to be related to the current illness, and he has probably benign prostatic hypertrophy and certainly some outflow obstruction causing him to have a Foley catheter.  My plan is for him to have start on Flomax, have the Foley removed.  If he is not able to urinate, he may need an evaluation by 1 of the urologists.  I did not plan to change anything else today.  I told him that as he improves, we should be able to get him ready to go home fairly soon.     Clinton Wahlberg L. Juanetta Gosling, M.D.     ELH/MEDQ  D:  03/26/2012  T:  03/27/2012  Job:  [id number]

## 2012-03-27 NOTE — Progress Notes (Signed)
REVIEWED.  

## 2012-03-27 NOTE — Progress Notes (Signed)
Tony Holt, Tony Holt               ACCOUNT NO.:  [id number]  MEDICAL RECORD NO.:  [id number]  LOCATION:  A335                          FACILITY:  [facility]  PHYSICIAN:  Bentlie Catanzaro L. Juanetta Gosling, M.D.DATE OF BIRTH:  17-Nov-1926  DATE OF PROCEDURE: DATE OF DISCHARGE:                                PROGRESS NOTE   Mr. Chausse says he feels better and wants to go home.  He did have EGD over the weekend.  His Foley catheter was removed yesterday and he was able to urinate.  PHYSICAL EXAMINATION:  VITAL SIGNS:  His temp is 98.1, pulse 42, respirations 20, blood pressure 158/73, O2 sats 94%. CHEST:  Clear. HEART:  Regular. GENERAL:  He looks much more comfortable.  He has had some low heart rates which are asymptomatic.  He is on metoprolol and I will plan to evaluate that as an outpatient.  He may need change in his blood pressure medications.  At any rate, I think he is ready for discharge and we will plan to discharge him home today. Please see discharge summary for details.     Shameria Trimarco L. Juanetta Gosling, M.D.     ELH/MEDQ  D:  03/27/2012  T:  03/27/2012  Job:  [id number]

## 2012-03-27 NOTE — Progress Notes (Signed)
D/c instructions reviewed with patient and son.  Verbalized understanding.  Pt dc'd to home with son.  Schonewitz, Candelaria Stagers 03/27/2012

## 2012-03-27 NOTE — Progress Notes (Signed)
   CARE MANAGEMENT NOTE 03/27/2012  Patient:  Tony Holt, Tony Holt   Account Number:  [id number]  Date Initiated:  03/24/2012  Documentation initiated by:  Sharrie Rothman  Subjective/Objective Assessment:   Pt admitted from home with abd pain. Pt lives alone and is independent with ADL's prior to getting sick. Pt son lives next door and daugher Boyd Kerbs lives close by. They check on pt frequently througout the day.     Action/Plan:   CM will follow for any CM needs at discharge.   Anticipated DC Date:  03/30/2012   Anticipated DC Plan:  HOME/SELF CARE      DC Planning Services  CM consult      Choice offered to / List presented to:             Status of service:  Completed, signed off Medicare Important Message given?   (If response is "NO", the following Medicare IM given date fields will be blank) Date Medicare IM given:   Date Additional Medicare IM given:    Discharge Disposition:  HOME/SELF CARE  Per UR Regulation:    If discussed at Long Length of Stay Meetings, dates discussed:    Comments:  03/27/12 1207 Arlyss Queen, RN BSN CM Pt discharged home. No CM needs noted.  03/24/12 1536 Arlyss Queen, RN BSN CM Pt daughter states that prior to pt getting sick he was able to work in his yard and garden. Pt has decreased appetite and therefore has gotten weak and dehydrated. Pt may benefit from [facility] at discharge if weakness persist.

## 2012-03-27 NOTE — Progress Notes (Signed)
[id number] 

## 2012-03-28 ENCOUNTER — Encounter: Payer: Self-pay | Admitting: Internal Medicine

## 2012-03-28 NOTE — Progress Notes (Signed)
Discharge summary sent to payer through [facility]  

## 2012-03-29 ENCOUNTER — Encounter (HOSPITAL_COMMUNITY): Payer: Self-pay | Admitting: Internal Medicine

## 2012-03-31 NOTE — Discharge Summary (Signed)
Physician Discharge Summary  Patient ID: Tony Holt MRN: [id number] DOB/AGE: [birth year] 78 y.o. Primary Care Physician:Samarra Ridgely L, MD, MD Admit date: 03/23/2012 Discharge date: 03/31/2012    Discharge Diagnoses:  Erosive esophagitis Principal Problem:  *Abdominal pain, epigastric Active Problems:  Nausea alone  HTN (hypertension)   Medication List  As of 03/31/2012 12:57 PM   TAKE these medications         lisinopril 20 MG tablet   Commonly known as: PRINIVIL,ZESTRIL   Take 1 tablet (20 mg total) by mouth daily.      metoprolol tartrate 25 MG tablet   Commonly known as: LOPRESSOR   Take 25 mg by mouth every other day.      omeprazole 20 MG capsule   Commonly known as: PRILOSEC   Take 1 capsule (20 mg total) by mouth 2 (two) times daily.      Tamsulosin HCl 0.4 MG Caps   Commonly known as: FLOMAX   Take 1 capsule (0.4 mg total) by mouth daily.            Discharged Condition:improved    Consults:gastroenterology  Significant Diagnostic Studies: Ct Abdomen Pelvis W Contrast  03/23/2012  *RADIOLOGY REPORT*  Clinical Data: Moderate abdominal pain for 3 days; nausea. Umbilical pain and weakness.  Decreased appetite noted.  CT ABDOMEN AND PELVIS WITH CONTRAST  Technique:  Multidetector CT imaging of the abdomen and pelvis was performed following the standard protocol during bolus administration of intravenous contrast.  Contrast: OMNIPAQUE IOHEXOL 300 MG/ML  SOLN  Comparison: CT of the abdomen and pelvis performed 02/26 1012  Findings: Mild right basilar atelectasis or scarring is noted.  There is distension of the gallbladder, with mild diffuse intrahepatic biliary ductal distension, and dilatation of the common hepatic duct to 1.1 cm.  A stone is suggested within the distal common hepatic duct on coronal images; findings raise suspicion for a more distal obstructing stone.  No significant pericholecystic fluid or gallbladder wall thickening is seen to  suggest cholecystitis.  The liver and spleen are unremarkable in appearance.  The gallbladder is within normal limits.  The pancreas and adrenal glands are unremarkable.  Scattered bilateral renal cysts are noted, measuring up to 5.4 cm in size.  Mild nonspecific perinephric stranding is noted bilaterally.  The kidneys are otherwise unremarkable in appearance. There is no evidence of hydronephrosis.  No renal or ureteral stones are seen.  No free fluid is identified.  The small bowel is unremarkable in appearance.  A tiny hiatal hernia is noted; the stomach is otherwise unremarkable in appearance.  No acute vascular abnormalities are seen.  Mild scattered calcification is noted along the abdominal aorta and its branches.  The appendix these difficult to fully characterize but appears grossly normal in caliber; there is no evidence for appendicitis. The colon is partially filled with stool and grossly unremarkable in appearance.  Scattered postoperative change is noted along the distal transverse and descending colon.  The bladder is mildly distended and grossly unremarkable in appearance.  Minimal calcification is noted dependently within the bladder, possibly reflecting recently passed tiny stones, as it appears new from the prior study.  The prostate is borderline normal in size.  No inguinal lymphadenopathy is seen.  Mild soft tissue inflammation and trace fluid noted along the right inguinal canal, of uncertain significance but stable from the prior study.  No acute osseous abnormalities are identified.  Mild vacuum phenomenon is noted at T12-L1.  IMPRESSION:  1.  Distension of  the gallbladder, with mild diffuse intrahepatic biliary ductal dilatation and dilatation of the common hepatic duct to 1.1 cm in diameter.  The intrahepatic biliary ductal dilatation was less prominent on the prior study, and the distal duct dilatation is new.  A small stone is suggested within the distal common hepatic duct, and findings  raise suspicion for an obstructing more distal stone.  MRCP or ERCP could be considered for further evaluation, as deemed clinically appropriate. 2.  Scattered bilateral renal cysts noted. 3.  Mild scattered calcification along the abdominal aorta and its branches. 4.  Minimal calcification dependently within the bladder may reflect recently passed tiny stones, as it appears new from the prior study. 5.  Mild soft tissue inflammation and trace fluid along the right inguinal canal, of uncertain significance but stable from the prior study. 6.  Mild right basilar atelectasis or scarring noted. 7.  Tiny hiatal hernia seen.  Original Report Authenticated By: Tonia Ghent, M.D.   Mr Mrcp  03/24/2012  *RADIOLOGY REPORT*  Clinical Data: Abdominal pain.  CT demonstrating biliary ductal dilatation and possible common duct stone.  MRI ABDOMEN WITHOUT CONTRAST (MRCP)  Technique:  Multiplanar multisequence MR imaging of the abdomen was performed, including heavily T2-weighted images of the biliary and pancreatic ducts.  Three-dimensional MR images were rendered by post processing of the original MR data.  Comparison: CT of 1 day prior.  MRI of the 08/03/2010.  Findings:  Exam moderately degraded by motion artifact.  Patient unable to follow directions.  Mild cardiomegaly without pericardial or pleural effusions. Scattered small T2 hyperintense liver lesions, likely cysts. Normal spleen, stomach.  Normal pancreas for age.  The pancreatic duct is upper normal within the head, including on image 25 series 3; this is unchanged.  No gallstones or evidence of acute cholecystitis.  There is mild intrahepatic biliary ductal dilatation.  This is similar, including on image 20 of series 3 (when compared to 08/03/2010).  Normal common duct size for patient age is between 8- 9 mm.  The common duct is best evaluated on series 2.  It measures 8 mm in the region of the porta hepatis and 8 mm image witin the pancreatic head.  When compared  back to 08/03/2010, this is felt to be similar (compared to image 20 of series 2 that exam).  There is no evidence of common duct stone, given above motion limitations. No obstructive mass.  Normal adrenal glands.  Bilateral renal cysts.  No abdominal ascites or adenopathy.  IMPRESSION:  1.  Moderately degraded exam, secondary to patient motion/inability to follow directions. 2.  Mild intrahepatic biliary ductal dilatation with similar upper normal common duct size, when compared 08/03/2010.  No convincing evidence of obstructive stone. 3.  No alternate explanation for abdominal pain.  Original Report Authenticated By: Consuello Bossier, M.D.   Mr 3d Recon At Scanner  03/24/2012  *RADIOLOGY REPORT*  Clinical Data: Abdominal pain.  CT demonstrating biliary ductal dilatation and possible common duct stone.  MRI ABDOMEN WITHOUT CONTRAST (MRCP)  Technique:  Multiplanar multisequence MR imaging of the abdomen was performed, including heavily T2-weighted images of the biliary and pancreatic ducts.  Three-dimensional MR images were rendered by post processing of the original MR data.  Comparison: CT of 1 day prior.  MRI of the 08/03/2010.  Findings:  Exam moderately degraded by motion artifact.  Patient unable to follow directions.  Mild cardiomegaly without pericardial or pleural effusions. Scattered small T2 hyperintense liver lesions, likely cysts. Normal spleen, stomach.  Normal pancreas for age.  The pancreatic duct is upper normal within the head, including on image 25 series 3; this is unchanged.  No gallstones or evidence of acute cholecystitis.  There is mild intrahepatic biliary ductal dilatation.  This is similar, including on image 20 of series 3 (when compared to 08/03/2010).  Normal common duct size for patient age is between 8- 9 mm.  The common duct is best evaluated on series 2.  It measures 8 mm in the region of the porta hepatis and 8 mm image witin the pancreatic head.  When compared back to 08/03/2010,  this is felt to be similar (compared to image 20 of series 2 that exam).  There is no evidence of common duct stone, given above motion limitations. No obstructive mass.  Normal adrenal glands.  Bilateral renal cysts.  No abdominal ascites or adenopathy.  IMPRESSION:  1.  Moderately degraded exam, secondary to patient motion/inability to follow directions. 2.  Mild intrahepatic biliary ductal dilatation with similar upper normal common duct size, when compared 08/03/2010.  No convincing evidence of obstructive stone. 3.  No alternate explanation for abdominal pain.  Original Report Authenticated By: Consuello Bossier, M.D.    Lab Results: Basic Metabolic Panel: No results found for this basename: NA:2,K:2,CL:2,CO2:2,GLUCOSE:2,BUN:2,CREATININE:2,CALCIUM:2,MG:2,PHOS:2 in the last 72 hours Liver Function Tests: No results found for this basename: AST:2,ALT:2,ALKPHOS:2,BILITOT:2,PROT:2,ALBUMIN:2 in the last 72 hours   CBC: No results found for this basename: WBC:2,NEUTROABS:2,HGB:2,HCT:2,MCV:2,PLT:2 in the last 72 hours  No results found for this or any previous visit (from the past 240 hour(s)).   Hospital Course: he was admitted with abdominal discomfort and flulike illness. His blood pressure was also elevated. He was noted to have abnormalities in the biliary system on CT. It was not felt that his symptoms matched these abnormalities. GI consultation was obtained and he underwent EGD which showed severe esophagitis. He was treated with b.i.d. PPI and he improved over the next 36 hours. He was then ready for discharge. His blood pressure medications were changed. He had some asymptomatic sinus bradycardia during sleep  Discharge Exam: Blood pressure 158/73, pulse 42, temperature 98.1 F (36.7 C), temperature source Oral, resp. rate 20, height 5\' 8"  (1.727 m), weight 57.6 kg (126 lb 15.8 oz), SpO2 94.00%. He was awake d alert and much more comfortable. His chest was clear. His heart was regular. His  abdomen was soft  Disposition: home    Follow-up Information    Follow up with Fredirick Maudlin, MD .         Signed: Fredirick Maudlin Pager [phone]  03/31/2012, 12:57 PM

## 2013-01-05 ENCOUNTER — Encounter (HOSPITAL_COMMUNITY): Payer: Self-pay | Admitting: Pharmacy Technician

## 2013-01-10 NOTE — Patient Instructions (Addendum)
Your procedure is scheduled on:  01/18/2013  Report to [facility] at   11:00   AM.  Call this number if you have problems the morning of surgery: [phone]   Remember:   Do not eat or drink :After Midnight.    Take these medicines the morning of surgery with A SIP OF WATER: lISINOPRIL, OMEPRAZOLE, METOPOLOL   Do not wear jewelry, make-up or nail polish.  Do not wear lotions, powders, or perfumes. You may wear deodorant.  Do not shave 48 hours prior to surgery.  Do not bring valuables to the hospital.  Contacts, dentures or bridgework may not be worn into surgery.  Patients discharged the day of surgery will not be allowed to drive home.  Name and phone number of your driver:    Please read over the following fact sheets that you were given: Pain Booklet, Surgical Site Infection Prevention, Anesthesia Post-op Instructions and Care and Recovery After Surgery  Cataract Surgery  A cataract is a clouding of the lens of the eye. When a lens becomes cloudy, vision is reduced based on the degree and nature of the clouding. Surgery may be needed to improve vision. Surgery removes the cloudy lens and usually replaces it with a substitute lens (intraocular lens, IOL). LET YOUR EYE DOCTOR KNOW ABOUT:  Allergies to food or medicine.   Medicines taken including herbs, eyedrops, over-the-counter medicines, and creams.   Use of steroids (by mouth or creams).   Previous problems with anesthetics or numbing medicine.   History of bleeding problems or blood clots.   Previous surgery.   Other health problems, including diabetes and kidney problems.   Possibility of pregnancy, if this applies.  RISKS AND COMPLICATIONS  Infection.   Inflammation of the eyeball (endophthalmitis) that can spread to both eyes (sympathetic ophthalmia).   Poor wound healing.   If an IOL is inserted, it can later fall out of proper position. This is very uncommon.   Clouding of the part of your eye that holds  an IOL in place. This is called an "after-cataract." These are uncommon, but easily treated.  BEFORE THE PROCEDURE  Do not eat or drink anything except small amounts of water for 8 to 12 before your surgery, or as directed by your caregiver.   Unless you are told otherwise, continue any eyedrops you have been prescribed.   Talk to your primary caregiver about all other medicines that you take (both prescription and non-prescription). In some cases, you may need to stop or change medicines near the time of your surgery. This is most important if you are taking blood-thinning medicine.Do not stop medicines unless you are told to do so.   Arrange for someone to drive you to and from the procedure.   Do not put contact lenses in either eye on the day of your surgery.  PROCEDURE There is more than one method for safely removing a cataract. Your doctor can explain the differences and help determine which is best for you. Phacoemulsification surgery is the most common form of cataract surgery.  An injection is given behind the eye or eyedrops are given to make this a painless procedure.   A small cut (incision) is made on the edge of the clear, dome-shaped surface that covers the front of the eye (cornea).   A tiny probe is painlessly inserted into the eye. This device gives off ultrasound waves that soften and break up the cloudy center of the lens. This makes it  easier for the cloudy lens to be removed by suction.   An IOL may be implanted.   The normal lens of the eye is covered by a clear capsule. Part of that capsule is intentionally left in the eye to support the IOL.   Your surgeon may or may not use stitches to close the incision.  There are other forms of cataract surgery that require a larger incision and stiches to close the eye. This approach is taken in cases where the doctor feels that the cataract cannot be easily removed using phacoemulsification. AFTER THE PROCEDURE  When an  IOL is implanted, it does not need care. It becomes a permanent part of your eye and cannot be seen or felt.   Your doctor will schedule follow-up exams to check on your progress.   Review your other medicines with your doctor to see which can be resumed after surgery.   Use eyedrops or take medicine as prescribed by your doctor.  Document Released: 11/18/2011 Document Reviewed: 11/15/2011 [facility] Patient Information 2012 [place].  .Cataract Surgery Care After Refer to this sheet in the next few weeks. These instructions provide you with information on caring for yourself after your procedure. Your caregiver may also give you more specific instructions. Your treatment has been planned according to current medical practices, but problems sometimes occur. Call your caregiver if you have any problems or questions after your procedure.  HOME CARE INSTRUCTIONS   Avoid strenuous activities as directed by your caregiver.   Ask your caregiver when you can resume driving.   Use eyedrops or other medicines to help healing and control pressure inside your eye as directed by your caregiver.   Only take over-the-counter or prescription medicines for pain, discomfort, or fever as directed by your caregiver.   Do not to touch or rub your eyes.   You may be instructed to use a protective shield during the first few days and nights after surgery. If not, wear sunglasses to protect your eyes. This is to protect the eye from pressure or from being accidentally bumped.   Keep the area around your eye clean and dry. Avoid swimming or allowing water to hit you directly in the face while showering. Keep soap and shampoo out of your eyes.   Do not bend or lift heavy objects. Bending increases pressure in the eye. You can walk, climb stairs, and do light household chores.   Do not put a contact lens into the eye that had surgery until your caregiver says it is okay to do so.   Ask your doctor when  you can return to work. This will depend on the kind of work that you do. If you work in a dusty environment, you may be advised to wear protective eyewear for a period of time.   Ask your caregiver when it will be safe to engage in sexual activity.   Continue with your regular eye exams as directed by your caregiver.  What to expect:  It is normal to feel itching and mild discomfort for a few days after cataract surgery. Some fluid discharge is also common, and your eye may be sensitive to light and touch.   After 1 to 2 days, even moderate discomfort should disappear. In most cases, healing will take about 6 weeks.   If you received an intraocular lens (IOL), you may notice that colors are very bright or have a blue tinge. Also, if you have been in bright sunlight, everything  may appear reddish for a few hours. If you see these color tinges, it is because your lens is clear and no longer cloudy. Within a few months after receiving an IOL, these extra colors should go away. When you have healed, you will probably need new glasses.  SEEK MEDICAL CARE IF:   You have increased bruising around your eye.   You have discomfort not helped by medicine.  SEEK IMMEDIATE MEDICAL CARE IF:   You have a fever.   You have a worsening or sudden vision loss.   You have redness, swelling, or increasing pain in the eye.   You have a thick discharge from the eye that had surgery.  MAKE SURE YOU:  Understand these instructions.   Will watch your condition.   Will get help right away if you are not doing well or get worse.  Document Released: 06/18/2005 Document Revised: 11/18/2011 Document Reviewed: 07/23/2011 [facility] Patient Information 2012 [place], [place].

## 2013-01-11 ENCOUNTER — Encounter (HOSPITAL_COMMUNITY)
Admission: RE | Admit: 2013-01-11 | Discharge: 2013-01-11 | Disposition: A | Discharge status: Home / Self Care | Payer: Medicare HMO | Source: Ambulatory Visit | Attending: Ophthalmology | Admitting: Ophthalmology

## 2013-01-11 ENCOUNTER — Encounter (HOSPITAL_COMMUNITY): Payer: Self-pay

## 2013-01-11 HISTORY — DX: Benign prostatic hyperplasia without lower urinary tract symptoms: N40.0

## 2013-01-11 HISTORY — DX: Gastro-esophageal reflux disease without esophagitis: K21.9

## 2013-01-11 HISTORY — DX: Unspecified hearing loss, unspecified ear: H91.90

## 2013-01-11 LAB — BASIC METABOLIC PANEL
CO2: 25 mEq/L (ref 19–32)
Calcium: 9.7 mg/dL (ref 8.4–10.5)
GFR calc Af Amer: 76 mL/min — ABNORMAL LOW (ref >90)
GFR calc non Af Amer: 65 mL/min — ABNORMAL LOW (ref >90)
Sodium: 140 mEq/L (ref 135–145)

## 2013-01-11 LAB — HEMOGLOBIN AND HEMATOCRIT, BLOOD
HCT: 40.8 % (ref 39.0–52.0)
Hemoglobin: 13.4 g/dL (ref 13.0–17.0)

## 2013-01-11 MED ORDER — LIDOCAINE HCL 3.5 % OP GEL: 1.0000 "application " | Freq: Once | OPHTHALMIC | Status: DC

## 2013-01-11 MED ORDER — CYCLOPENTOLATE-PHENYLEPHRINE 0.2-1 % OP SOLN: 1.0000 [drp] | OPHTHALMIC | Status: DC

## 2013-01-11 MED ORDER — TETRACAINE HCL 0.5 % OP SOLN: 1.0000 [drp] | OPHTHALMIC | Status: DC

## 2013-01-11 MED ORDER — PHENYLEPHRINE HCL 2.5 % OP SOLN: 1.0000 [drp] | OPHTHALMIC | Status: DC

## 2013-01-17 MED ORDER — PHENYLEPHRINE HCL 2.5 % OP SOLN
OPHTHALMIC | Status: AC
  Filled 2013-01-17: qty 2

## 2013-01-17 MED ORDER — LIDOCAINE HCL (PF) 1 % IJ SOLN
INTRAMUSCULAR | Status: AC
  Filled 2013-01-17: qty 2

## 2013-01-17 MED ORDER — TETRACAINE HCL 0.5 % OP SOLN
OPHTHALMIC | Status: AC
  Filled 2013-01-17: qty 2

## 2013-01-17 MED ORDER — LIDOCAINE HCL 3.5 % OP GEL
OPHTHALMIC | Status: AC
  Filled 2013-01-17: qty 5

## 2013-01-17 MED ORDER — NEOMYCIN-POLYMYXIN-DEXAMETH 3.5-10000-0.1 OP OINT
TOPICAL_OINTMENT | OPHTHALMIC | Status: AC
  Filled 2013-01-17: qty 3.5

## 2013-01-17 MED ORDER — CYCLOPENTOLATE-PHENYLEPHRINE 0.2-1 % OP SOLN
OPHTHALMIC | Status: AC
  Filled 2013-01-17: qty 2

## 2013-01-18 ENCOUNTER — Ambulatory Visit (HOSPITAL_COMMUNITY): Payer: Medicare HMO | Admitting: Anesthesiology

## 2013-01-18 ENCOUNTER — Encounter (HOSPITAL_COMMUNITY): Payer: Self-pay | Admitting: *Deleted

## 2013-01-18 ENCOUNTER — Ambulatory Visit (HOSPITAL_COMMUNITY)
Admission: RE | Admit: 2013-01-18 | Discharge: 2013-01-18 | Disposition: A | Discharge status: Home / Self Care | Payer: Medicare HMO | Source: Ambulatory Visit | Attending: Ophthalmology | Admitting: Ophthalmology

## 2013-01-18 ENCOUNTER — Encounter (HOSPITAL_COMMUNITY): Payer: Self-pay | Admitting: Anesthesiology

## 2013-01-18 ENCOUNTER — Encounter (HOSPITAL_COMMUNITY)
Admission: RE | Disposition: A | Discharge status: Home / Self Care | Payer: Self-pay | Source: Ambulatory Visit | Attending: Ophthalmology

## 2013-01-18 DIAGNOSIS — I10 Essential (primary) hypertension: Secondary | ICD-10-CM | POA: Insufficient documentation

## 2013-01-18 DIAGNOSIS — Z01812 Encounter for preprocedural laboratory examination: Secondary | ICD-10-CM | POA: Insufficient documentation

## 2013-01-18 DIAGNOSIS — Z79899 Other long term (current) drug therapy: Secondary | ICD-10-CM | POA: Insufficient documentation

## 2013-01-18 DIAGNOSIS — H2181 Floppy iris syndrome: Secondary | ICD-10-CM | POA: Insufficient documentation

## 2013-01-18 DIAGNOSIS — Z01810 Encounter for preprocedural cardiovascular examination: Secondary | ICD-10-CM | POA: Insufficient documentation

## 2013-01-18 DIAGNOSIS — H2510 Age-related nuclear cataract, unspecified eye: Secondary | ICD-10-CM | POA: Insufficient documentation

## 2013-01-18 HISTORY — PX: CATARACT EXTRACTION W/PHACO: SHX586

## 2013-01-18 SURGERY — PHACOEMULSIFICATION, CATARACT, WITH IOL INSERTION
Anesthesia: Monitor Anesthesia Care | Site: Eye | Laterality: Left | Wound class: Clean

## 2013-01-18 MED ORDER — LIDOCAINE HCL (PF) 1 % IJ SOLN
INTRAOCULAR | Status: DC | PRN
  Administered 2013-01-18: 13:00:00 via OPHTHALMIC

## 2013-01-18 MED ORDER — MIDAZOLAM HCL 2 MG/2ML IJ SOLN
1.0000 mg | INTRAMUSCULAR | Status: DC | PRN
  Administered 2013-01-18: 1 mg via INTRAVENOUS

## 2013-01-18 MED ORDER — LACTATED RINGERS IV SOLN
INTRAVENOUS | Status: DC
  Administered 2013-01-18: 12:00:00 via INTRAVENOUS

## 2013-01-18 MED ORDER — BSS IO SOLN
INTRAOCULAR | Status: DC | PRN
  Administered 2013-01-18: 15 mL via INTRAOCULAR

## 2013-01-18 MED ORDER — ACETAZOLAMIDE 250 MG PO TABS
ORAL_TABLET | ORAL | Status: AC
  Filled 2013-01-18: qty 1

## 2013-01-18 MED ORDER — LIDOCAINE HCL 3.5 % OP GEL: 1.0000 "application " | Freq: Once | OPHTHALMIC | Status: DC

## 2013-01-18 MED ORDER — FENTANYL CITRATE 0.05 MG/ML IJ SOLN: 25.0000 ug | INTRAMUSCULAR | Status: DC | PRN

## 2013-01-18 MED ORDER — POVIDONE-IODINE 5 % OP SOLN
OPHTHALMIC | Status: DC | PRN
  Administered 2013-01-18: 1 via OPHTHALMIC

## 2013-01-18 MED ORDER — CYCLOPENTOLATE-PHENYLEPHRINE 0.2-1 % OP SOLN
1.0000 [drp] | OPHTHALMIC | Status: AC
  Administered 2013-01-18 (×3): 1 [drp] via OPHTHALMIC

## 2013-01-18 MED ORDER — ONDANSETRON HCL 4 MG/2ML IJ SOLN: 4.0000 mg | Freq: Once | INTRAMUSCULAR | Status: DC | PRN

## 2013-01-18 MED ORDER — PHENYLEPHRINE HCL 2.5 % OP SOLN
1.0000 [drp] | OPHTHALMIC | Status: AC
Start: 2013-01-18 — End: 2013-01-18
  Administered 2013-01-18 (×3): 1 [drp] via OPHTHALMIC

## 2013-01-18 MED ORDER — LACTATED RINGERS IV SOLN
INTRAVENOUS | Status: DC | PRN
  Administered 2013-01-18: 12:00:00 via INTRAVENOUS

## 2013-01-18 MED ORDER — NEOMYCIN-POLYMYXIN-DEXAMETH 0.1 % OP OINT
TOPICAL_OINTMENT | OPHTHALMIC | Status: DC | PRN
  Administered 2013-01-18: 1 via OPHTHALMIC

## 2013-01-18 MED ORDER — EPINEPHRINE HCL 1 MG/ML IJ SOLN
INTRAMUSCULAR | Status: AC
  Filled 2013-01-18: qty 1

## 2013-01-18 MED ORDER — EPINEPHRINE HCL 1 MG/ML IJ SOLN
INTRAOCULAR | Status: DC | PRN
  Administered 2013-01-18: 12:00:00

## 2013-01-18 MED ORDER — ACETAZOLAMIDE 250 MG PO TABS
250.0000 mg | ORAL_TABLET | Freq: Once | ORAL | Status: AC
  Administered 2013-01-18: 250 mg via ORAL

## 2013-01-18 MED ORDER — PROVISC 10 MG/ML IO SOLN
INTRAOCULAR | Status: DC | PRN
  Administered 2013-01-18: 8.5 mg via INTRAOCULAR

## 2013-01-18 MED ORDER — TETRACAINE HCL 0.5 % OP SOLN
1.0000 [drp] | OPHTHALMIC | Status: AC
  Administered 2013-01-18 (×3): 1 [drp] via OPHTHALMIC

## 2013-01-18 MED ORDER — MIDAZOLAM HCL 5 MG/5ML IJ SOLN
INTRAMUSCULAR | Status: AC
  Filled 2013-01-18: qty 5

## 2013-01-18 SURGICAL SUPPLY — 32 items
CAPSULAR TENSION RING-AMO (OPHTHALMIC RELATED) IMPLANT
CLOTH BEACON ORANGE TIMEOUT ST (SAFETY) ×2 IMPLANT
EYE SHIELD UNIVERSAL CLEAR (GAUZE/BANDAGES/DRESSINGS) ×2 IMPLANT
GLOVE BIO SURGEON STRL SZ 6.5 (GLOVE) IMPLANT
GLOVE BIOGEL PI IND STRL 6.5 (GLOVE) IMPLANT
GLOVE BIOGEL PI IND STRL 7.0 (GLOVE) IMPLANT
GLOVE BIOGEL PI IND STRL 7.5 (GLOVE) IMPLANT
GLOVE BIOGEL PI INDICATOR 6.5 (GLOVE) ×1
GLOVE BIOGEL PI INDICATOR 7.0 (GLOVE)
GLOVE BIOGEL PI INDICATOR 7.5 (GLOVE)
GLOVE ECLIPSE 6.5 STRL STRAW (GLOVE) IMPLANT
GLOVE ECLIPSE 7.0 STRL STRAW (GLOVE) IMPLANT
GLOVE ECLIPSE 7.5 STRL STRAW (GLOVE) IMPLANT
GLOVE EXAM NITRILE LRG STRL (GLOVE) ×2 IMPLANT
GLOVE EXAM NITRILE MD LF STRL (GLOVE) IMPLANT
GLOVE SKINSENSE NS SZ6.5 (GLOVE)
GLOVE SKINSENSE NS SZ7.0 (GLOVE)
GLOVE SKINSENSE STRL SZ6.5 (GLOVE) IMPLANT
GLOVE SKINSENSE STRL SZ7.0 (GLOVE) IMPLANT
KIT VITRECTOMY (OPHTHALMIC RELATED) IMPLANT
PAD ARMBOARD 7.5X6 YLW CONV (MISCELLANEOUS) ×2 IMPLANT
PROC W NO LENS (INTRAOCULAR LENS)
PROC W SPEC LENS (INTRAOCULAR LENS)
PROCESS W NO LENS (INTRAOCULAR LENS) IMPLANT
PROCESS W SPEC LENS (INTRAOCULAR LENS) IMPLANT
RING MALYGIN (MISCELLANEOUS) IMPLANT
SIGHTPATH CAT PROC W REG LENS (Ophthalmic Related) ×2 IMPLANT
SYR TB 1ML LL NO SAFETY (SYRINGE) ×2 IMPLANT
TAPE SURG TRANSPORE 1 IN (GAUZE/BANDAGES/DRESSINGS) IMPLANT
TAPE SURGICAL TRANSPORE 1 IN (GAUZE/BANDAGES/DRESSINGS) ×1
VISCOELASTIC ADDITIONAL (OPHTHALMIC RELATED) IMPLANT
WATER STERILE IRR 250ML POUR (IV SOLUTION) ×2 IMPLANT

## 2013-01-18 NOTE — Anesthesia Preprocedure Evaluation (Signed)
Anesthesia Evaluation  Patient identified by MRN, date of birth, ID band Patient awake    Reviewed: Allergy & Precautions, H&P , NPO status , Patient's Chart, lab work & pertinent test results  Airway Mallampati: II      Dental  (+) Edentulous Upper and Edentulous Lower   Pulmonary neg pulmonary ROS,  breath sounds clear to auscultation        Cardiovascular hypertension, Pt. on medications Rhythm:Regular Rate:Bradycardia     Neuro/Psych    GI/Hepatic GERD-  ,  Endo/Other    Renal/GU      Musculoskeletal   Abdominal   Peds  Hematology   Anesthesia Other Findings   Reproductive/Obstetrics                           Anesthesia Physical Anesthesia Plan  ASA: III  Anesthesia Plan: MAC   Post-op Pain Management:    Induction: Intravenous  Airway Management Planned: Nasal Cannula  Additional Equipment:   Intra-op Plan:   Post-operative Plan:   Informed Consent: I have reviewed the patients History and Physical, chart, labs and discussed the procedure including the risks, benefits and alternatives for the proposed anesthesia with the patient or authorized representative who has indicated his/her understanding and acceptance.     Plan Discussed with:   Anesthesia Plan Comments:         Anesthesia Quick Evaluation

## 2013-01-18 NOTE — Preoperative (Signed)
Beta Blockers   Reason not to administer Beta Blockers:Not Applicable 

## 2013-01-18 NOTE — Transfer of Care (Signed)
  Anesthesia Post-op Note  Patient: Tony Holt  Procedure(s) Performed: Procedure(s) (LRB) with comments: CATARACT EXTRACTION PHACO AND INTRAOCULAR LENS PLACEMENT (IOC) (Left) - CDE 17.11  Patient Location: SS  Anesthesia Type: MAC  Level of Consciousness: awake, alert , oriented and patient cooperative  Airway and Oxygen Therapy: Patient Spontanous Breathing room air  Post-op Pain: mild  Post-op Assessment: Post-op Vital signs reviewed, Patient's Cardiovascular Status Stable, Respiratory Function Stable, Patent Airway and No signs of Nausea or vomiting  Post-op Vital Signs: Reviewed and stable  Complications: No apparent anesthesia complications

## 2013-01-18 NOTE — Anesthesia Postprocedure Evaluation (Signed)
  Anesthesia Post-op Note  Patient: Tony Holt  Procedure(s) Performed: Procedure(s) (LRB) with comments: CATARACT EXTRACTION PHACO AND INTRAOCULAR LENS PLACEMENT (IOC) (Left) - CDE 17.11  Patient Location: SS  Anesthesia Type: MAC  Level of Consciousness: awake, alert , oriented and patient cooperative  Airway and Oxygen Therapy: Patient Spontanous Breathing room air  Post-op Pain: mild  Post-op Assessment: Post-op Vital signs reviewed, Patient's Cardiovascular Status Stable, Respiratory Function Stable, Patent Airway and No signs of Nausea or vomiting  Post-op Vital Signs: Reviewed and stable  Complications: No apparent anesthesia complications  

## 2013-01-18 NOTE — Anesthesia Procedure Notes (Signed)
Procedure Name: MAC Date/Time: 01/18/2013 12:29 PM Performed by: Carolyne Littles, AMY L Pre-anesthesia Checklist: Patient identified, Patient being monitored, Emergency Drugs available, Timeout performed and Suction available Oxygen Delivery Method: Nasal cannula

## 2013-01-18 NOTE — OR Nursing (Signed)
Blood pressure elevated and reported to Dr. Jayme Cloud

## 2013-01-18 NOTE — H&P (Signed)
I have reviewed the H&P, the patient was re-examined, and I have identified no interval changes in medical condition and plan of care since the history and physical of record  

## 2013-01-19 ENCOUNTER — Encounter (HOSPITAL_COMMUNITY): Payer: Self-pay | Admitting: Ophthalmology

## 2013-01-19 NOTE — Op Note (Signed)
[name], Tony Holt               ACCOUNT NO.:  [id number]  MEDICAL RECORD NO.:  [id number]  LOCATION:  [facility]                          FACILITY:  [facility]  PHYSICIAN:  Susanne Greenhouse, MD       DATE OF BIRTH:  Oct 27, 1926  DATE OF PROCEDURE:  01/18/2013 DATE OF DISCHARGE:  01/18/2013                              OPERATIVE REPORT   PREOPERATIVE DIAGNOSIS:  Nuclear cataract, left eye, diagnosis code 366.16.  POSTOPERATIVE DIAGNOSES: 1. Nuclear cataract, left eye, diagnosis code 366.16. 2. 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  OPERATION PERFORMED:  Phacoemulsification with posterior chamber intraocular lens implantation, left eye.  SURGEON:  Bonne Dolores. Esgar Barnick, MD  ANESTHESIA:  Topical with monitored anesthesia care and IV sedation.  OPERATIVE SUMMARY:  In the preoperative area, dilating drops were placed into the left eye.  The patient was then brought into the operating room where he was placed under topical anesthesia and IV sedation.  The eye was then prepped and draped.  Beginning with a 75 blade, a paracentesis port was made at the surgeon's 2 o'clock position.  The anterior chamber was then filled with a 1% nonpreserved lidocaine solution with epinephrine.  This was followed by Viscoat to deepen the chamber.  A small fornix-based peritomy was performed superiorly.  Next, a single iris hook was placed through the limbus superiorly.  A 2.4-mm keratome blade was then used to make a clear corneal incision over the iris hook. A bent cystotome needle and Utrata forceps were used to create a continuous tear capsulotomy.  Hydrodissection was performed using balanced salt solution on a fine cannula.  The lens nucleus was then removed using phacoemulsification in a quadrant cracking technique.  The cortical material was then removed with irrigation and aspiration.  The capsular bag and anterior chamber were refilled with Provisc.  The wound was widened to approximately 3 mm and a  posterior chamber intraocular lens was placed into the capsular bag without difficulty using an [employer] lens injecting system.  A single 10-0 nylon suture was then used to close the incision as well as stromal hydration.  The Provisc was removed from the anterior chamber and capsular bag with irrigation and aspiration.  At this point, the wounds were tested for leak, which were negative.  The anterior chamber remained deep and stable.  The patient tolerated the procedure well.  There were no operative complications, and he awoke from topical anesthesia and IV sedation without problem. No surgical specimens.  Prosthetic device used is a Theme park manager, model EnVista, model number MX60, power of 21.5, serial number is [id number].          ______________________________ Susanne Greenhouse, MD     KEH/MEDQ  D:  01/18/2013  T:  01/19/2013  Job:  [id number]

## 2013-01-29 ENCOUNTER — Encounter (HOSPITAL_COMMUNITY): Payer: Self-pay | Admitting: Pharmacy Technician

## 2013-01-31 ENCOUNTER — Encounter (HOSPITAL_COMMUNITY)
Admission: RE | Admit: 2013-01-31 | Discharge: 2013-01-31 | Disposition: A | Discharge status: Home / Self Care | Payer: Medicare HMO | Source: Ambulatory Visit | Attending: Ophthalmology | Admitting: Ophthalmology

## 2013-02-01 ENCOUNTER — Encounter (HOSPITAL_COMMUNITY): Payer: Self-pay | Admitting: *Deleted

## 2013-02-05 ENCOUNTER — Ambulatory Visit (HOSPITAL_COMMUNITY): Payer: Medicare HMO | Admitting: Anesthesiology

## 2013-02-05 ENCOUNTER — Encounter (HOSPITAL_COMMUNITY): Payer: Self-pay | Admitting: *Deleted

## 2013-02-05 ENCOUNTER — Ambulatory Visit (HOSPITAL_COMMUNITY)
Admission: RE | Admit: 2013-02-05 | Discharge: 2013-02-05 | Disposition: A | Discharge status: Home Health | Payer: Medicare HMO | Source: Ambulatory Visit | Attending: Ophthalmology | Admitting: Ophthalmology

## 2013-02-05 ENCOUNTER — Encounter (HOSPITAL_COMMUNITY)
Admission: RE | Disposition: A | Discharge status: Home Health | Payer: Self-pay | Source: Ambulatory Visit | Attending: Ophthalmology

## 2013-02-05 ENCOUNTER — Encounter (HOSPITAL_COMMUNITY): Payer: Self-pay | Admitting: Anesthesiology

## 2013-02-05 DIAGNOSIS — H2510 Age-related nuclear cataract, unspecified eye: Secondary | ICD-10-CM | POA: Insufficient documentation

## 2013-02-05 DIAGNOSIS — H2181 Floppy iris syndrome: Secondary | ICD-10-CM | POA: Insufficient documentation

## 2013-02-05 DIAGNOSIS — I10 Essential (primary) hypertension: Secondary | ICD-10-CM | POA: Insufficient documentation

## 2013-02-05 HISTORY — PX: CATARACT EXTRACTION W/PHACO: SHX586

## 2013-02-05 SURGERY — PHACOEMULSIFICATION, CATARACT, WITH IOL INSERTION
Anesthesia: Monitor Anesthesia Care | Site: Eye | Laterality: Right | Wound class: Clean

## 2013-02-05 MED ORDER — PROVISC 10 MG/ML IO SOLN
INTRAOCULAR | Status: DC | PRN
  Administered 2013-02-05: 8.5 mg via INTRAOCULAR

## 2013-02-05 MED ORDER — EPINEPHRINE HCL 1 MG/ML IJ SOLN
INTRAMUSCULAR | Status: AC
  Filled 2013-02-05: qty 1

## 2013-02-05 MED ORDER — LABETALOL HCL 5 MG/ML IV SOLN
10.0000 mg | INTRAVENOUS | Status: DC | PRN
  Administered 2013-02-05: 10 mg via INTRAVENOUS

## 2013-02-05 MED ORDER — PHENYLEPHRINE HCL 2.5 % OP SOLN
1.0000 [drp] | OPHTHALMIC | Status: AC
  Administered 2013-02-05 (×3): 1 [drp] via OPHTHALMIC

## 2013-02-05 MED ORDER — LIDOCAINE HCL (PF) 1 % IJ SOLN
INTRAOCULAR | Status: DC | PRN
  Administered 2013-02-05: 11:00:00 via OPHTHALMIC

## 2013-02-05 MED ORDER — LACTATED RINGERS IV SOLN
INTRAVENOUS | Status: DC
  Administered 2013-02-05: 11:00:00 via INTRAVENOUS

## 2013-02-05 MED ORDER — LIDOCAINE 3.5 % OP GEL OPTIME - NO CHARGE
OPHTHALMIC | Status: DC | PRN
  Administered 2013-02-05: 1 [drp] via OPHTHALMIC

## 2013-02-05 MED ORDER — MIDAZOLAM HCL 2 MG/2ML IJ SOLN
1.0000 mg | INTRAMUSCULAR | Status: DC | PRN
  Administered 2013-02-05: 2 mg via INTRAVENOUS

## 2013-02-05 MED ORDER — LIDOCAINE HCL 3.5 % OP GEL
1.0000 "application " | Freq: Once | OPHTHALMIC | Status: AC
  Administered 2013-02-05: 1 via OPHTHALMIC

## 2013-02-05 MED ORDER — MIDAZOLAM HCL 2 MG/2ML IJ SOLN
INTRAMUSCULAR | Status: AC
  Filled 2013-02-05: qty 2

## 2013-02-05 MED ORDER — NEOMYCIN-POLYMYXIN-DEXAMETH 0.1 % OP OINT
TOPICAL_OINTMENT | OPHTHALMIC | Status: DC | PRN
  Administered 2013-02-05: 1 via OPHTHALMIC

## 2013-02-05 MED ORDER — LABETALOL HCL 5 MG/ML IV SOLN
INTRAVENOUS | Status: AC
  Filled 2013-02-05: qty 4

## 2013-02-05 MED ORDER — TETRACAINE HCL 0.5 % OP SOLN
1.0000 [drp] | OPHTHALMIC | Status: AC
  Administered 2013-02-05 (×3): 1 [drp] via OPHTHALMIC

## 2013-02-05 MED ORDER — CYCLOPENTOLATE-PHENYLEPHRINE 0.2-1 % OP SOLN
1.0000 [drp] | OPHTHALMIC | Status: AC
  Administered 2013-02-05 (×3): 1 [drp] via OPHTHALMIC

## 2013-02-05 MED ORDER — BSS IO SOLN
INTRAOCULAR | Status: DC | PRN
  Administered 2013-02-05: 15 mL via INTRAOCULAR

## 2013-02-05 MED ORDER — POVIDONE-IODINE 5 % OP SOLN
OPHTHALMIC | Status: DC | PRN
  Administered 2013-02-05: 1 via OPHTHALMIC

## 2013-02-05 MED ORDER — BSS IO SOLN
INTRAOCULAR | Status: DC | PRN
  Administered 2013-02-05: 11:00:00

## 2013-02-05 SURGICAL SUPPLY — 31 items
CAPSULAR TENSION RING-AMO (OPHTHALMIC RELATED) IMPLANT
CLOTH BEACON ORANGE TIMEOUT ST (SAFETY) ×2 IMPLANT
EYE SHIELD UNIVERSAL CLEAR (GAUZE/BANDAGES/DRESSINGS) ×2 IMPLANT
GLOVE BIO SURGEON STRL SZ 6.5 (GLOVE) IMPLANT
GLOVE BIOGEL PI IND STRL 6.5 (GLOVE) ×1 IMPLANT
GLOVE BIOGEL PI IND STRL 7.0 (GLOVE) IMPLANT
GLOVE BIOGEL PI IND STRL 7.5 (GLOVE) IMPLANT
GLOVE BIOGEL PI INDICATOR 6.5 (GLOVE) ×1
GLOVE BIOGEL PI INDICATOR 7.0 (GLOVE)
GLOVE BIOGEL PI INDICATOR 7.5 (GLOVE)
GLOVE ECLIPSE 6.5 STRL STRAW (GLOVE) IMPLANT
GLOVE ECLIPSE 7.0 STRL STRAW (GLOVE) IMPLANT
GLOVE ECLIPSE 7.5 STRL STRAW (GLOVE) IMPLANT
GLOVE EXAM NITRILE LRG STRL (GLOVE) IMPLANT
GLOVE EXAM NITRILE MD LF STRL (GLOVE) ×2 IMPLANT
GLOVE SKINSENSE NS SZ6.5 (GLOVE)
GLOVE SKINSENSE NS SZ7.0 (GLOVE)
GLOVE SKINSENSE STRL SZ6.5 (GLOVE) IMPLANT
GLOVE SKINSENSE STRL SZ7.0 (GLOVE) IMPLANT
KIT VITRECTOMY (OPHTHALMIC RELATED) IMPLANT
PAD ARMBOARD 7.5X6 YLW CONV (MISCELLANEOUS) ×2 IMPLANT
PROC W NO LENS (INTRAOCULAR LENS)
PROC W SPEC LENS (INTRAOCULAR LENS)
PROCESS W NO LENS (INTRAOCULAR LENS) IMPLANT
PROCESS W SPEC LENS (INTRAOCULAR LENS) IMPLANT
RING MALYGIN (MISCELLANEOUS) IMPLANT
SIGHTPATH CAT PROC W REG LENS (Ophthalmic Related) ×2 IMPLANT
SYR TB 1ML LL NO SAFETY (SYRINGE) ×1 IMPLANT
TAPE CLOTH SOFT 2X10 (GAUZE/BANDAGES/DRESSINGS) ×2 IMPLANT
VISCOELASTIC ADDITIONAL (OPHTHALMIC RELATED) IMPLANT
WATER STERILE IRR 250ML POUR (IV SOLUTION) ×2 IMPLANT

## 2013-02-05 NOTE — Brief Op Note (Signed)
Pre-Op Dx: Cataract OD Post-Op Dx: Cataract OD Surgeon: Srijan Givan Anesthesia: Topical with MAC Surgery: Cataract Extraction with Intraocular lens Implant OD Implant: B&L enVista Specimen: None Complications: None 

## 2013-02-05 NOTE — Anesthesia Procedure Notes (Signed)
Procedure Name: MAC Date/Time: 02/05/2013 11:01 AM Performed by: Franco Nones Pre-anesthesia Checklist: Patient identified, Emergency Drugs available, Suction available, Timeout performed and Patient being monitored Patient Re-evaluated:Patient Re-evaluated prior to inductionOxygen Delivery Method: Nasal Cannula

## 2013-02-05 NOTE — Anesthesia Postprocedure Evaluation (Signed)
  Anesthesia Post-op Note  Patient: Tony Holt  Procedure(s) Performed: Procedure(s) (LRB): CATARACT EXTRACTION PHACO AND INTRAOCULAR LENS PLACEMENT (IOC) (Right)  Patient Location:  Short Stay  Anesthesia Type: MAC  Level of Consciousness: awake  Airway and Oxygen Therapy: Patient Spontanous Breathing  Post-op Pain: none  Post-op Assessment: Post-op Vital signs reviewed, Patient's Cardiovascular Status Stable, Respiratory Function Stable, Patent Airway, No signs of Nausea or vomiting and Pain level controlled  Post-op Vital Signs: Reviewed and stable  Complications: No apparent anesthesia complications

## 2013-02-05 NOTE — Transfer of Care (Signed)
Immediate Anesthesia Transfer of Care Note  Patient: Tony Holt  Procedure(s) Performed: Procedure(s) (LRB): CATARACT EXTRACTION PHACO AND INTRAOCULAR LENS PLACEMENT (IOC) (Right)  Patient Location: Shortstay  Anesthesia Type: MAC  Level of Consciousness: awake  Airway & Oxygen Therapy: Patient Spontanous Breathing   Post-op Assessment: Report given to PACU RN, Post -op Vital signs reviewed and stable and Patient moving all extremities  Post vital signs: Reviewed and stable  Complications: No apparent anesthesia complications

## 2013-02-05 NOTE — Op Note (Signed)
Tony Holt, Tony Holt               ACCOUNT NO.:  [id number]  MEDICAL RECORD NO.:  [id number]  LOCATION:  [facility]                          FACILITY:  [facility]  PHYSICIAN:  Susanne Greenhouse, MD       DATE OF BIRTH:  1926/08/17  DATE OF PROCEDURE: DATE OF DISCHARGE:  02/05/2013                              OPERATIVE REPORT   PREOPERATIVE DIAGNOSIS:  Nuclear cataract, right eye, diagnosis code 366.16.  POSTOPERATIVE DIAGNOSIS: 1. Nuclear cataract, right eye, diagnosis code 366.16. 2. Intraoperative floppy iris syndrome, diagnosis code 364.81.  PROCEDURE PERFORMED:  Phacoemulsification with posterior chamber intraocular lens implantation, right eye.  ANESTHESIA:  Topical with monitored anesthesia care and IV sedation.  SURGEON:  Bonne Dolores. Keiri Solano, MD.  DESCRIPTION OF THE PROCEDURE:  In the preoperative area, dilating drops were placed into the left eye.  The patient was then brought into the operating room where he was placed under topical anesthesia and IV sedation.  The eye was then prepped and draped.  Beginning with a 75 blade, a paracentesis port was made at the surgeon's 2 o'clock position. The anterior chamber was then filled with a 1% nonpreserved lidocaine solution with epinephrine.  This was followed by Viscoat to deepen the chamber.  A small fornix-based peritomy was performed superiorly.  Next, a single iris hook was placed through the limbus superiorly.  A 2.4-mm keratome blade was then used to make a clear corneal incision over the iris hook.  A bent cystotome needle and Utrata forceps were used to create a continuous tear capsulotomy.  Hydrodissection was performed using balanced salt solution on a fine cannula.  The lens nucleus was then removed using phacoemulsification in a quadrant cracking technique. The cortical material was then removed with irrigation and aspiration. The capsular bag and anterior chamber were refilled with Provisc.  The wound was widened to approximately 3  mm and a posterior chamber intraocular lens was placed into the capsular bag without difficulty using an [employer] lens injecting system.  A single 10-0 nylon suture was then used to close the incision as well as stromal hydration. The Provisc was removed from the anterior chamber and capsular bag with irrigation and aspiration.  At this point, the wounds were tested for leak, which were negative.  The anterior chamber remained deep and stable.  The patient tolerated the procedure well.  There were no operative complications, and he awoke from topical anesthesia and IV sedation without problem. No surgical specimens.  Prosthetic device used is a Theme park manager, model EnVista, model number MX60, power of 21.0, serial number is [id number].          ______________________________ Susanne Greenhouse, MD     KEH/MEDQ  D:  02/05/2013  T:  02/05/2013  Job:  [id number]

## 2013-02-05 NOTE — H&P (Signed)
I have reviewed the H&P, the patient was re-examined, and I have identified no interval changes in medical condition and plan of care since the history and physical of record  

## 2013-02-05 NOTE — Anesthesia Preprocedure Evaluation (Signed)
Anesthesia Evaluation  Patient identified by MRN, date of birth, ID band Patient awake    Reviewed: Allergy & Precautions, H&P , NPO status , Patient's Chart, lab work & pertinent test results  Airway Mallampati: II      Dental  (+) Edentulous Upper and Edentulous Lower   Pulmonary neg pulmonary ROS,  breath sounds clear to auscultation        Cardiovascular hypertension, Pt. on medications Rhythm:Regular Rate:Bradycardia     Neuro/Psych    GI/Hepatic GERD-  ,  Endo/Other    Renal/GU      Musculoskeletal   Abdominal   Peds  Hematology   Anesthesia Other Findings   Reproductive/Obstetrics                           Anesthesia Physical Anesthesia Plan  ASA: III  Anesthesia Plan: MAC   Post-op Pain Management:    Induction: Intravenous  Airway Management Planned: Nasal Cannula  Additional Equipment:   Intra-op Plan:   Post-operative Plan:   Informed Consent: I have reviewed the patients History and Physical, chart, labs and discussed the procedure including the risks, benefits and alternatives for the proposed anesthesia with the patient or authorized representative who has indicated his/her understanding and acceptance.     Plan Discussed with:   Anesthesia Plan Comments:         Anesthesia Quick Evaluation  

## 2013-02-07 ENCOUNTER — Encounter (HOSPITAL_COMMUNITY): Payer: Self-pay | Admitting: Ophthalmology

## 2015-02-26 DIAGNOSIS — K210 Gastro-esophageal reflux disease with esophagitis: Secondary | ICD-10-CM | POA: Diagnosis not present

## 2015-02-26 DIAGNOSIS — H9193 Unspecified hearing loss, bilateral: Secondary | ICD-10-CM | POA: Diagnosis not present

## 2015-02-26 DIAGNOSIS — I10 Essential (primary) hypertension: Secondary | ICD-10-CM | POA: Diagnosis not present

## 2015-03-06 DIAGNOSIS — H6120 Impacted cerumen, unspecified ear: Secondary | ICD-10-CM | POA: Diagnosis not present

## 2015-03-06 DIAGNOSIS — I10 Essential (primary) hypertension: Secondary | ICD-10-CM | POA: Diagnosis not present

## 2015-06-09 DIAGNOSIS — N401 Enlarged prostate with lower urinary tract symptoms: Secondary | ICD-10-CM | POA: Diagnosis not present

## 2015-06-09 DIAGNOSIS — I10 Essential (primary) hypertension: Secondary | ICD-10-CM | POA: Diagnosis not present

## 2015-06-09 DIAGNOSIS — M545 Low back pain: Secondary | ICD-10-CM | POA: Diagnosis not present

## 2015-06-09 DIAGNOSIS — M129 Arthropathy, unspecified: Secondary | ICD-10-CM | POA: Diagnosis not present

## 2015-12-04 DIAGNOSIS — R001 Bradycardia, unspecified: Secondary | ICD-10-CM | POA: Diagnosis not present

## 2015-12-04 DIAGNOSIS — M545 Low back pain: Secondary | ICD-10-CM | POA: Diagnosis not present

## 2015-12-04 DIAGNOSIS — I10 Essential (primary) hypertension: Secondary | ICD-10-CM | POA: Diagnosis not present

## 2015-12-04 DIAGNOSIS — K219 Gastro-esophageal reflux disease without esophagitis: Secondary | ICD-10-CM | POA: Diagnosis not present

## 2015-12-04 DIAGNOSIS — H9193 Unspecified hearing loss, bilateral: Secondary | ICD-10-CM | POA: Diagnosis not present

## 2016-03-02 DIAGNOSIS — M129 Arthropathy, unspecified: Secondary | ICD-10-CM | POA: Diagnosis not present

## 2016-03-02 DIAGNOSIS — I10 Essential (primary) hypertension: Secondary | ICD-10-CM | POA: Diagnosis not present

## 2016-03-02 DIAGNOSIS — R1010 Upper abdominal pain, unspecified: Secondary | ICD-10-CM | POA: Diagnosis not present

## 2016-03-02 DIAGNOSIS — N401 Enlarged prostate with lower urinary tract symptoms: Secondary | ICD-10-CM | POA: Diagnosis not present

## 2016-04-19 DIAGNOSIS — H524 Presbyopia: Secondary | ICD-10-CM | POA: Diagnosis not present

## 2016-04-19 DIAGNOSIS — H401123 Primary open-angle glaucoma, left eye, severe stage: Secondary | ICD-10-CM | POA: Diagnosis not present

## 2016-04-19 DIAGNOSIS — H52223 Regular astigmatism, bilateral: Secondary | ICD-10-CM | POA: Diagnosis not present

## 2016-04-19 DIAGNOSIS — H5203 Hypermetropia, bilateral: Secondary | ICD-10-CM | POA: Diagnosis not present

## 2016-05-28 ENCOUNTER — Emergency Department (HOSPITAL_COMMUNITY): Payer: Commercial Managed Care - HMO

## 2016-05-28 ENCOUNTER — Encounter (HOSPITAL_COMMUNITY): Payer: Self-pay

## 2016-05-28 ENCOUNTER — Observation Stay (HOSPITAL_BASED_OUTPATIENT_CLINIC_OR_DEPARTMENT_OTHER): Payer: Commercial Managed Care - HMO

## 2016-05-28 ENCOUNTER — Observation Stay (HOSPITAL_COMMUNITY)
Admission: EM | Admit: 2016-05-28 | Discharge: 2016-05-30 | Disposition: A | Discharge status: Home / Self Care | Payer: Commercial Managed Care - HMO | Attending: Pulmonary Disease | Admitting: Pulmonary Disease

## 2016-05-28 DIAGNOSIS — I498 Other specified cardiac arrhythmias: Secondary | ICD-10-CM

## 2016-05-28 DIAGNOSIS — R0600 Dyspnea, unspecified: Secondary | ICD-10-CM | POA: Diagnosis not present

## 2016-05-28 DIAGNOSIS — R001 Bradycardia, unspecified: Secondary | ICD-10-CM | POA: Diagnosis present

## 2016-05-28 DIAGNOSIS — E875 Hyperkalemia: Secondary | ICD-10-CM | POA: Diagnosis not present

## 2016-05-28 DIAGNOSIS — Z87891 Personal history of nicotine dependence: Secondary | ICD-10-CM | POA: Diagnosis not present

## 2016-05-28 DIAGNOSIS — Z79899 Other long term (current) drug therapy: Secondary | ICD-10-CM | POA: Insufficient documentation

## 2016-05-28 DIAGNOSIS — R262 Difficulty in walking, not elsewhere classified: Secondary | ICD-10-CM | POA: Insufficient documentation

## 2016-05-28 DIAGNOSIS — I10 Essential (primary) hypertension: Secondary | ICD-10-CM | POA: Diagnosis present

## 2016-05-28 DIAGNOSIS — Z85038 Personal history of other malignant neoplasm of large intestine: Secondary | ICD-10-CM | POA: Insufficient documentation

## 2016-05-28 DIAGNOSIS — N179 Acute kidney failure, unspecified: Secondary | ICD-10-CM

## 2016-05-28 DIAGNOSIS — N189 Chronic kidney disease, unspecified: Secondary | ICD-10-CM

## 2016-05-28 DIAGNOSIS — N289 Disorder of kidney and ureter, unspecified: Secondary | ICD-10-CM | POA: Diagnosis not present

## 2016-05-28 DIAGNOSIS — R531 Weakness: Secondary | ICD-10-CM

## 2016-05-28 HISTORY — DX: Malignant neoplasm of colon, unspecified: C18.9

## 2016-05-28 HISTORY — DX: Essential (primary) hypertension: I10

## 2016-05-28 LAB — URINALYSIS, ROUTINE W REFLEX MICROSCOPIC
Bilirubin Urine: NEGATIVE
GLUCOSE, UA: NEGATIVE mg/dL
Hgb urine dipstick: NEGATIVE
Ketones, ur: NEGATIVE mg/dL
LEUKOCYTES UA: NEGATIVE
NITRITE: NEGATIVE
PH: 5.5 (ref 5.0–8.0)
Protein, ur: NEGATIVE mg/dL
SPECIFIC GRAVITY, URINE: 1.015 (ref 1.005–1.030)

## 2016-05-28 LAB — ECHOCARDIOGRAM COMPLETE
AVPHT: 786 ms
CHL CUP MV DEC (S): 264
CHL CUP TV REG PEAK VELOCITY: 303 cm/s
E decel time: 264 msec
E/e' ratio: 10.81
FS: 36 % (ref 28–44)
Height: 69 in
IVS/LV PW RATIO, ED: 0.96
LA ID, A-P, ES: 40 mm
LAVOL: 52.1 mL
LAVOLA4C: 49.6 mL
LDCA: 3.14 cm2
LEFT ATRIUM END SYS DIAM: 40 mm
LV E/e' medial: 10.81
LV E/e'average: 10.81
LV PW d: 10.2 mm — AB (ref 0.6–1.1)
LV TDI E'LATERAL: 6.64
LV e' LATERAL: 6.64 cm/s
LVOTD: 20 mm
MV Peak grad: 2 mmHg
MV pk A vel: 54.8 m/s
MV pk E vel: 71.8 m/s
RV TAPSE: 23 mm
RV sys press: 40 mmHg
TDI e' medial: 4.9
TR max vel: 303 cm/s
Weight: 1947.1 oz

## 2016-05-28 LAB — BASIC METABOLIC PANEL
Anion gap: 4 — ABNORMAL LOW (ref 5–15)
BUN: 54 mg/dL — ABNORMAL HIGH (ref 6–20)
CHLORIDE: 113 mmol/L — AB (ref 101–111)
CO2: 19 mmol/L — AB (ref 22–32)
Calcium: 9.4 mg/dL (ref 8.9–10.3)
Creatinine, Ser: 1.66 mg/dL — ABNORMAL HIGH (ref 0.61–1.24)
GFR calc Af Amer: 41 mL/min — ABNORMAL LOW (ref >60)
GFR calc non Af Amer: 35 mL/min — ABNORMAL LOW (ref >60)
GLUCOSE: 103 mg/dL — AB (ref 65–99)
POTASSIUM: 5.5 mmol/L — AB (ref 3.5–5.1)
Sodium: 136 mmol/L (ref 135–145)

## 2016-05-28 LAB — CBC WITH DIFFERENTIAL/PLATELET
Basophils Absolute: 0 10*3/uL (ref 0.0–0.1)
Basophils Relative: 0 %
Eosinophils Absolute: 0.1 10*3/uL (ref 0.0–0.7)
Eosinophils Relative: 1 %
HEMATOCRIT: 35.8 % — AB (ref 39.0–52.0)
HEMOGLOBIN: 12.1 g/dL — AB (ref 13.0–17.0)
LYMPHS ABS: 0.9 10*3/uL (ref 0.7–4.0)
LYMPHS PCT: 12 %
MCH: 30.9 pg (ref 26.0–34.0)
MCHC: 33.8 g/dL (ref 30.0–36.0)
MCV: 91.3 fL (ref 78.0–100.0)
MONOS PCT: 8 %
Monocytes Absolute: 0.6 10*3/uL (ref 0.1–1.0)
NEUTROS PCT: 79 %
Neutro Abs: 5.4 10*3/uL (ref 1.7–7.7)
Platelets: 158 10*3/uL (ref 150–400)
RBC: 3.92 MIL/uL — AB (ref 4.22–5.81)
RDW: 13.7 % (ref 11.5–15.5)
WBC: 6.9 10*3/uL (ref 4.0–10.5)

## 2016-05-28 LAB — TROPONIN I: Troponin I: <0.03 ng/mL (ref <0.031)

## 2016-05-28 MED ORDER — SODIUM CHLORIDE 0.9% FLUSH
3.0000 mL | Freq: Two times a day (BID) | INTRAVENOUS | Status: DC
  Administered 2016-05-28 – 2016-05-29 (×3): 3 mL via INTRAVENOUS

## 2016-05-28 MED ORDER — SODIUM CHLORIDE 0.9 % IV BOLUS (SEPSIS)
1000.0000 mL | Freq: Once | INTRAVENOUS | Status: AC
  Administered 2016-05-28: 1000 mL via INTRAVENOUS

## 2016-05-28 MED ORDER — SODIUM CHLORIDE 0.9 % IV SOLN
INTRAVENOUS | Status: DC
  Administered 2016-05-28: 14:00:00 via INTRAVENOUS
  Administered 2016-05-30: 1000 mL via INTRAVENOUS

## 2016-05-28 MED ORDER — SODIUM CHLORIDE 0.9 % IV SOLN
INTRAVENOUS | Status: AC
Start: 2016-05-28 — End: 2016-05-29
  Administered 2016-05-28: 14:00:00 via INTRAVENOUS

## 2016-05-28 MED ORDER — TAMSULOSIN HCL 0.4 MG PO CAPS
0.4000 mg | ORAL_CAPSULE | Freq: Every day | ORAL | Status: DC
  Administered 2016-05-29 – 2016-05-30 (×2): 0.4 mg via ORAL
  Filled 2016-05-28 (×2): qty 1

## 2016-05-28 MED ORDER — HEPARIN SODIUM (PORCINE) 5000 UNIT/ML IJ SOLN
5000.0000 [IU] | Freq: Three times a day (TID) | INTRAMUSCULAR | Status: DC
  Administered 2016-05-28 – 2016-05-30 (×5): 5000 [IU] via SUBCUTANEOUS
  Filled 2016-05-28 (×5): qty 1

## 2016-05-28 MED ORDER — PANTOPRAZOLE SODIUM 40 MG PO TBEC
40.0000 mg | DELAYED_RELEASE_TABLET | Freq: Every day | ORAL | Status: DC
  Administered 2016-05-29 – 2016-05-30 (×2): 40 mg via ORAL
  Filled 2016-05-28 (×2): qty 1

## 2016-05-28 MED ORDER — SODIUM POLYSTYRENE SULFONATE 15 GM/60ML PO SUSP
15.0000 g | Freq: Once | ORAL | Status: AC
  Administered 2016-05-28: 15 g via ORAL
  Filled 2016-05-28: qty 60

## 2016-05-28 MED ORDER — HYDRALAZINE HCL 20 MG/ML IJ SOLN: 5.0000 mg | Freq: Four times a day (QID) | INTRAMUSCULAR | Status: DC | PRN

## 2016-05-28 NOTE — Consult Note (Signed)
CARDIOLOGY CONSULT NOTE   Patient ID: Tony Holt MRN: [id number] DOB/AGE: [birth year] 82 y.o.  Admit Date: 05/28/2016 Referring Physician: Luan Holt Primary Physician: Tony Bogus, MD Consulting Cardiologist: Tony Lesches MD Reason for Consultation: Bradycardia and weakness  Clinical Summary Tony Holt is an 82 y.o.male with known history of hypertension on metoprolol and lisinopril, no prior cardiac history, GERD, colon CA,who presented to the ER  with weakness worsening over the last 3 days. ECG demonstrated sinus bradycardia versus ectopic atrial bradycardia with HR of 41 bpm with signiicant LVH. He is followed by Tony Holt.  He states that he has been medically compliant. He lives alone but has been so weak that he has not been active and has been lying in bed on and off for 3 days. He denies chest pain, dyspnea, pre-syncope or syncope, but has been dizzy. No NVD.  On arrival to ER, BP 149/55, HR 40, R-15. Pertinent labs showed K+ of 5.5., Creatinine of 1.66, troponin <0.03, He was not found to be anemic or thromboytopenic. There was no leukocytosis. Due to symptomatic bradycardia, he will be admitted by Hospitalist with cardiology consult requested for recommendations.  No Known Allergies  Home Medications No current facility-administered medications on file prior to encounter.   Current Outpatient Prescriptions on File Prior to Encounter  Medication Sig Dispense Refill  . lisinopril (PRINIVIL,ZESTRIL) 20 MG tablet Take 1 tablet (20 mg total) by mouth daily. 30 tablet 12  . Tamsulosin HCl (FLOMAX) 0.4 MG CAPS Take 1 capsule (0.4 mg total) by mouth daily. 30 capsule 12  . omeprazole (PRILOSEC) 20 MG capsule Take 1 capsule (20 mg total) by mouth 2 (two) times daily. 60 capsule 12    Past Medical History  Diagnosis Date  . Essential hypertension   . BPH (benign prostatic hyperplasia)   . Hard of hearing   . GERD (gastroesophageal reflux disease)   . Colon  cancer [facility])     Past Surgical History  Procedure Laterality Date  . Colon surgery  1999    Sigmoid colon cancer, segmental resection by Tony Holt  . Colonoscopy  2002    Multiple adenomatous colon polyps  . Hernia repair  02/2011    Right inguinal hernia repair with mesh, Dr. Geroge Baseman  . Esophagogastroduodenoscopy  03/25/2012    Procedure: ESOPHAGOGASTRODUODENOSCOPY (EGD);  Surgeon: Daneil Dolin, MD;  Location: [facility];  Service: Endoscopy;  Laterality: Left;  . Cataract extraction w/phaco  01/18/2013    Procedure: CATARACT EXTRACTION PHACO AND INTRAOCULAR LENS PLACEMENT (IOC);  Surgeon: Tonny Branch, MD;  Location: [facility] ORS;  Service: Ophthalmology;  Laterality: Left;  CDE 17.11  . Cataract extraction w/phaco Right 02/05/2013    Procedure: CATARACT EXTRACTION PHACO AND INTRAOCULAR LENS PLACEMENT (IOC);  Surgeon: Tonny Branch, MD;  Location: [facility]S;  Service: Ophthalmology;  Laterality: Right;  CDE:19.49    Family History  Problem Relation Age of Onset  . Heart attack Father     Deceased, age 77s  . Colon cancer Neg Hx   . Liver disease Neg Hx     Social History Tony Holt reports that he has quit smoking. His smoking use included Cigarettes. His smokeless tobacco use includes Snuff. Tony Holt reports that he does not drink alcohol.  Review of Systems Complete review of systems are found to be negative unless outlined in H&P above. Hard of hearing, uses hearing aids. Not eating well, reportedly has lost weight.  Physical Examination Blood pressure 185/67, pulse 41, temperature  97.5 F (36.4 C), temperature source Oral, resp. rate 13, height 5\' 5"  (1.651 m), weight 120 lb (54.432 kg), SpO2 100 %. No intake or output data in the 24 hours ending 05/28/16 1132  Telemetry: Sinus bradycardia rates 40-42 bpm  GEN: Frail elderly male (82), in no acute distress. HEENT: Conjunctiva and lids normal, oropharynx clear with dentures. Neck: Supple, no elevated JVP or carotid bruits, no  thyromegaly. Lungs: Clear to auscultation, nonlabored breathing at rest. Cardiac: Regular rate and rhythm, bradycardic, no S3, soft systolic murmur. Abdomen: Soft, nontender, bowel sounds present, no guarding or rebound. Extremities: No pitting edema, distal pulses 2+. Skin: Warm and dry. Musculoskeletal:  Mild kyphosis. Neuropsychiatric: Alert and oriented x3, affect grossly appropriate.Extremely HOH.   Lab Results  Basic Metabolic Panel:  Recent Labs Lab 05/28/16 0903  NA 136  K 5.5*  CL 113*  CO2 19*  GLUCOSE 103*  BUN 54*  CREATININE 1.66*  CALCIUM 9.4   CBC:  Recent Labs Lab 05/28/16 0903  WBC 6.9  NEUTROABS 5.4  HGB 12.1*  HCT 35.8*  MCV 91.3  PLT 158    Cardiac Enzymes:  Recent Labs Lab 05/28/16 0903  TROPONINI <0.03    Radiology: Dg Chest 2 View  05/28/2016  CLINICAL DATA:  Generalized weakness, possible tick bite EXAM: CHEST  2 VIEW COMPARISON:  None. FINDINGS: The heart size and mediastinal contours are within normal limits. Both lungs are clear. The visualized skeletal structures shows degenerative change of the thoracic spine. IMPRESSION: No active cardiopulmonary disease. Electronically Signed   By: Inez Catalina M.D.   On: 05/28/2016 09:38    ECG: Sinus versus ectopic atrial bradycardia with rate of 41 bpm with LVH  Impression and Recommendations  1. Symptomatic Bradycardia on BB: Will hold metoprolol and wait for wash out. Would observe on telemetry and follow-up ECG in the morning. Cycle cardiac markers, but doubt ACS. At this point no clear indication for pacemaker.  2. Essential hpertension: LVH is seen on EKG. Will have echo completed. Continue ACE, may need to increase or add a different agent such as Norvasc as we are stopping BB.   3. Renal insufficiency: Possibly acute on chronic, do not have access to interval outpatient lab work. Creatinine was 1.0 in 2014.  Signed: Phill Myron. Lawrence NP [place]  05/28/2016, 11:32 AM Co-Sign  MD   Attending note:  Patient seen and examined. Reviewed records and modified above noted by Ms. Lawrence NP. Tony Holt presents to the hospital with recent progressive fatigue over the last week. Also reports decreased appetite and interval weight loss. He follows with Tony Holt as an outpatient for hypertension, and is on a combination of lisinopril and metoprolol. Noted to be bradycardic around 40 at presentation, sinus or ectopic atrial bradycardia noted by ECG with significant LVH. He is otherwise hemodynamically stable and in no distress. He has not had syncope.  On examination he appears comfortable, very hard of hearing, provides limited history. Systolic blood pressure 0000000 to 180s, heart rate in the low 40s. Lungs are clear, cardiac exam with RRR and soft systolic murmur. No peripheral edema. Lab work shows creatinine 1.6 (previously 1.0 and 2014), troponin I negative, hemoglobin 12.1.  Discussed with patient and grandson present. He is being admitted to the hospitalist service for observation on telemetry. Hold Lopressor for now and observe, follow-up ECG in the morning. Echocardiogram also being obtained to assess cardiac structure and function. He may need to have adjustment in either dose of his lisinopril  or the addition of Norvasc for additional blood pressure control. Could have acute on chronic renal insufficiency, although I do not have access to interval outpatient lab work. May need to be gently hydrated for now. At this point no clear indication for pacemaker.  Satira Sark, M.D., F.A.C.C.

## 2016-05-28 NOTE — ED Provider Notes (Addendum)
CSN: [id number]     Arrival date & time 05/28/16  [id number] History  By signing my name below, I, Tony Holt, attest that this documentation has been prepared under the direction and in the presence of Tony Christen, MD.   Electronically Signed: Nicole Holt, ED Scribe. 05/28/2016. 10:00 AM   Chief Complaint  Patient presents with  . Weakness   The history is provided by the patient. No language interpreter was used.   HPI Comments: Tony Holt is a 82 y.o. male with PMHx of HTN who presents to the Emergency Department complaining of gradual onset, generalized weakness, ongoing for three days. Son reports associated nausea and loss of appetite. His son states "he hasn't left the bed for a few days". Pt reports he had a tick taken off his right axilla area earlier this week. No other associated symptoms noted. No worsening or alleviating factors noted. Pt denies chest pain, shortness of breath, difficulty urinating, difficulty ambulating, or any other pertinent symptoms. PCP is Dr. Sinda Du.  Past Medical History  Diagnosis Date  . Hypertension   . BPH (benign prostatic hyperplasia)   . Hard of hearing   . GERD (gastroesophageal reflux disease)   . Cancer [facility])     colon cancer   Past Surgical History  Procedure Laterality Date  . Colon surgery  1999    sigmoid colon cancer, segmental resection by Drs. Smith/Bradford  . Colonoscopy  2002    multiple adenomatous colon polyps  . Hernia repair  02/2011    right inguinal hernia repair with mesh, Dr. Geroge Baseman  . Esophagogastroduodenoscopy  03/25/2012    Procedure: ESOPHAGOGASTRODUODENOSCOPY (EGD);  Surgeon: Daneil Dolin, MD;  Location[facility];  Service: Endoscopy;  Laterality: Left;  . Cataract extraction w/phaco  01/18/2013    Procedure: CATARACT EXTRACTION PHACO AND INTRAOCULAR LENS PLACEMENT (IOC);  Surgeon: Tonny Branch, MD;  Location: [facility]S;  Service: Ophthalmology;  Laterality: Left;  CDE 17.11  . Cataract  extraction w/phaco Right 02/05/2013    Procedure: CATARACT EXTRACTION PHACO AND INTRAOCULAR LENS PLACEMENT (IOC);  Surgeon: Tonny Branch, MD;  Location: [facility];  Service: Ophthalmology;  Laterality: Right;  CDE:19.49   Family History  Problem Relation Age of Onset  . Heart attack Father     deceased, age 18s  . Colon cancer Neg Hx   . Liver disease Neg Hx    Social History  Substance Use Topics  . Smoking status: Former Research scientist (life sciences)  . Smokeless tobacco: Current User    Types: Snuff  . Alcohol Use: No     Comment: previous heavy drinker at age 50-30    Review of Systems A complete 10 system review of systems was obtained and all systems are negative except as noted in the HPI and PMH.    Allergies  Review of patient's allergies indicates no known allergies.  Home Medications   Prior to Admission medications   Medication Sig Start Date End Date Taking? Authorizing Provider  diphenhydramine-acetaminophen (TYLENOL PM) 25-500 MG TABS tablet Take 2 tablets by mouth at bedtime as needed.   Yes Historical Provider, MD  HYDROcodone-acetaminophen (NORCO/VICODIN) 5-325 MG tablet Take 5-325 tablets by mouth 4 (four) times daily as needed. 05/13/16  Yes Historical Provider, MD  lisinopril (PRINIVIL,ZESTRIL) 20 MG tablet Take 1 tablet (20 mg total) by mouth daily. 03/27/12 05/28/16 Yes Sinda Du, MD  metoprolol (LOPRESSOR) 50 MG tablet Take 50 mg by mouth daily. 05/08/16  Yes Historical Provider, MD  Multiple Vitamin (  MULTIVITAMIN) capsule Take 1 capsule by mouth daily.   Yes Historical Provider, MD  omeprazole (PRILOSEC) 20 MG capsule Take 20 mg by mouth 2 (two) times daily. 05/09/16  Yes Historical Provider, MD  Tamsulosin HCl (FLOMAX) 0.4 MG CAPS Take 1 capsule (0.4 mg total) by mouth daily. 03/27/12  Yes Sinda Du, MD  omeprazole (PRILOSEC) 20 MG capsule Take 1 capsule (20 mg total) by mouth 2 (two) times daily. 03/27/12 03/27/13  Sinda Du, MD   BP 149/55 mmHg  Pulse 40  Temp(Src) 97.5  F (36.4 C) (Oral)  Resp 18  Ht 5\' 5"  (1.651 m)  Wt 120 lb (54.432 kg)  BMI 19.97 kg/m2  SpO2 100% Physical Exam  Constitutional: He is oriented to person, place, and time. He appears well-developed and well-nourished.  Pale and tired appearing.  HENT:  Head: Normocephalic and atraumatic.  Eyes: Conjunctivae and EOM are normal. Pupils are equal, round, and reactive to light.  Neck: Normal range of motion. Neck supple.  Cardiovascular: Regular rhythm.  Bradycardia present.   Bradycardic.  Pulmonary/Chest: Effort normal and breath sounds normal.  Abdominal: Soft. Bowel sounds are normal.  Musculoskeletal: Normal range of motion.  Neurological: He is alert and oriented to person, place, and time.  Skin: Skin is warm and dry. There is pallor.  Psychiatric: He has a normal mood and affect. His behavior is normal.  Nursing note and vitals reviewed.   ED Course  Procedures (including critical care time) DIAGNOSTIC STUDIES: Oxygen Saturation is 100% on RA, normal by my interpretation.    COORDINATION OF CARE: 8:37 AM Discussed treatment plan which includes CBC with differential/platelette, BMP, and urinalysis with pt at bedside and pt agreed to plan.  Labs Review Labs Reviewed  CBC WITH DIFFERENTIAL/PLATELET - Abnormal; Notable for the following:    RBC 3.92 (*)    Hemoglobin 12.1 (*)    HCT 35.8 (*)    All other components within normal limits  BASIC METABOLIC PANEL - Abnormal; Notable for the following:    Potassium 5.5 (*)    Chloride 113 (*)    CO2 19 (*)    Glucose, Bld 103 (*)    BUN 54 (*)    Creatinine, Ser 1.66 (*)    GFR calc non Af Amer 35 (*)    GFR calc Af Amer 41 (*)    Anion gap 4 (*)    All other components within normal limits  TROPONIN I  URINALYSIS, ROUTINE W REFLEX MICROSCOPIC (NOT AT [facility])    Imaging Review Dg Chest 2 View  05/28/2016  CLINICAL DATA:  Generalized weakness, possible tick bite EXAM: CHEST  2 VIEW COMPARISON:  None. FINDINGS: The  heart size and mediastinal contours are within normal limits. Both lungs are clear. The visualized skeletal structures shows degenerative change of the thoracic spine. IMPRESSION: No active cardiopulmonary disease. Electronically Signed   By: Inez Catalina M.D.   On: 05/28/2016 09:38   I have personally reviewed and evaluated these images and lab results as part of my medical decision-making.   EKG Interpretation   Date/Time:  Friday May 28 2016 08:41:00 EDT Ventricular Rate:  41 PR Interval:  176 QRS Duration: 104 QT Interval:  489 QTC Calculation: 404 R Axis:   71 Text Interpretation:  Ectopic atrial bradycardia Left ventricular  hypertrophy Confirmed by Lacinda Axon  MD, Sukhman Martine ([id number]) on 05/28/2016 9:58:26 AM      MDM   Final diagnoses:  Bradycardia  Renal insufficiency   Patient is  presently on a beta blocker metoprolol 50 mg daily. His pulses in the low 40s. Creatinine is elevated today at 1.66.   Will hydrate, consult cardiology, admit to general medicine.  I personally performed the services described in this documentation, which was scribed in my presence. The recorded information has been reviewed and is accurate.       Tony Christen, MD 05/28/16 [place], MD 05/28/16 [phone]

## 2016-05-28 NOTE — ED Notes (Signed)
Pt reports generalized weakness and nausea x 3 days.  Son says pt has just been laying in the bed.  Reports has gotten some ticks off of him recently.

## 2016-05-28 NOTE — Evaluation (Signed)
Physical Therapy Evaluation Patient Details Name: Tony Holt MRN: [id number] DOB: 1926/01/24 Today's Date: 05/28/2016   History of Present Illness  82 yo M admitted with progressive weakness over the past 3 days.  Dx: bradycardia, and acute renal failure.  PMH: HTN, GERD, colon CA, BPH, HOH, hernia repair.  Clinical Impression  Pt received in bed, and was agreeable to PT evaluation.  Pt expressed that he normally ambulates independently, and is able to complete his ADL's, and IADL's independently.  However, pt states that he cannot count the number of falls he has had in the last 6 months, but then tries to downplay deficits by stating that "they weren't really falls."  During today's evaluation he required Min A for ambulation due to multiple LOB, and if PT was not there, he would have fallen.  Pt is recommended to start use of RW during ambulation, as well as have 24/7 supervision/assistance at home, and HHPT to progress with balance and strength needed to safely return to daily functional activities.      Follow Up Recommendations Home health PT;Supervision/Assistance - 24 hour    Equipment Recommendations  Rolling walker with 5" wheels    Recommendations for Other Services       Precautions / Restrictions Precautions Precautions: Fall Precaution Comments: Pt states that he falls often - most of the time he states he gets his feet tangled up, and trips over things.  Restrictions Weight Bearing Restrictions: No      Mobility  Bed Mobility Overal bed mobility: Modified Independent                Transfers Overall transfer level: Needs assistance   Transfers: Sit to/from Stand Sit to Stand: Min guard         General transfer comment: Due to initial unsteadiness.  Pt states that many times when he gets up, he takes several steps or nearly falls at times.   Ambulation/Gait Ambulation/Gait assistance: Min assist Ambulation Distance (Feet): 300 Feet Assistive  device: None Gait Pattern/deviations: Staggering left;Ataxic;Scissoring     General Gait Details: Pt demontrated multiple LOB, which required Min A from PT to prevent pt from falling.    Stairs            Wheelchair Mobility    Modified Rankin (Stroke Patients Only)       Balance Overall balance assessment: Needs assistance;History of Falls           Standing balance-Leahy Scale: Poor Standing balance comment: Required Min A during gait due to multiple LOB.                              Pertinent Vitals/Pain Pain Assessment: No/denies pain    Home Living Family/patient expects to be discharged to:: Private residence Living Arrangements: Alone   Type of Home: House Home Access: Level entry     Home Layout: Two level Home Equipment: Cane - single point;Shower seat      Prior Function Level of Independence: Independent with assistive device(s)         Comments: Pt states he occasionally uses a cane when he feels unsteady.      Hand Dominance        Extremity/Trunk Assessment   Upper Extremity Assessment: Overall WFL for tasks assessed           Lower Extremity Assessment: Generalized weakness      Cervical / Trunk Assessment: Kyphotic  Communication   Communication: [facility]  Cognition                            General Comments      Exercises        Assessment/Plan    PT Assessment Patient needs continued PT services  PT Diagnosis Difficulty walking;Abnormality of gait;Generalized weakness   PT Problem List Decreased strength;Decreased activity tolerance;Decreased balance;Decreased mobility;Decreased coordination;Decreased knowledge of use of DME;Decreased safety awareness;Decreased knowledge of precautions;Cardiopulmonary status limiting activity  PT Treatment Interventions DME instruction;Gait training;Functional mobility training;Therapeutic activities;Therapeutic exercise;Balance training;Patient/family  education   PT Goals (Current goals can be found in the Care Plan section) Acute Rehab PT Goals Patient Stated Goal: Pt states that he wants to go home.  PT Goal Formulation: With patient Time For Goal Achievement: 06/04/16 Potential to Achieve Goals: Good    Frequency Min 3X/week   Barriers to discharge Decreased caregiver support      Co-evaluation               End of Session Equipment Utilized During Treatment: Gait belt Activity Tolerance: Patient tolerated treatment well Patient left: in chair;with call bell/phone within reach Nurse Communication: Mobility status    Functional Assessment Tool Used: [employer] "6-clicks"  Functional Limitation: Mobility: Walking and moving around Mobility: Walking and Moving Around Current Status [phone]): At least 40 percent but less than 60 percent impaired, limited or restricted Mobility: Walking and Moving Around Goal Status [phone]): At least 20 percent but less than 40 percent impaired, limited or restricted    Time: [id number] PT Time Calculation (min) (ACUTE ONLY): 28 min   Charges:   PT Evaluation $PT Eval Moderate Complexity: 1 Procedure PT Treatments $Gait Training: 8-22 mins   PT G Codes:   PT G-Codes **NOT FOR INPATIENT CLASS** Functional Assessment Tool Used: [employer] "6-clicks"  Functional Limitation: Mobility: Walking and moving around Mobility: Walking and Moving Around Current Status [phone]): At least 40 percent but less than 60 percent impaired, limited or restricted Mobility: Walking and Moving Around Goal Status [phone]): At least 20 percent but less than 40 percent impaired, limited or restricted    Beth Nadia Viar, PT, DPT X: [phone]

## 2016-05-28 NOTE — ED Notes (Signed)
Patient's family purchased meal for patient. Patient currently eating and drinking at this time.

## 2016-05-28 NOTE — H&P (Signed)
TRH H&P   Patient Demographics:    Tony Holt, is a 82 y.o. male  MRN: [id number]   DOB - 05-Sep-1926  Admit Date - 05/28/2016  Outpatient Primary MD for the patient is Alonza Bogus, MD  Referring MD/NP/PA: Dr Lacinda Axon     Chief Complaint  Patient presents with  . Weakness      HPI:    Tony Holt  is a 82 y.o. male,  With history of hypertension, GERD, colon cancer, presents to ED with progressive weakness over last 3 days, denies any chest pain, shortness of breath, chest pain, cough, fever or chills 3 syncope or syncope. In ED workup significant for bradycardia, heart rate in the low 40s, he is on metoprolol, as well significant for acute renal failure with a creatinine of 1.66, as well hyperkalemia with potassium of 5.5, patient is on lisinopril at home, hospitalist requested to admit due to symptomatic bradycardia, dehydration, and generalized weakness.   Review of systems:   In addition to the HPI above,  No Fever-chills, No Headache, No changes with Vision or hearing, No problems swallowing food or Liquids, No Chest pain, Cough or Shortness of Breath, No Abdominal pain, No Nausea or Vommitting, Bowel movements are regular, No Blood in stool or Urine, No dysuria, No new skin rashes or bruises, No new joints pains-aches,  No Focal new weakness, tingling, numbness in any extremity, reports generalized weakness No recent weight gain or loss, No polyuria, polydypsia or polyphagia, No significant Mental Stressors.  A full 10 point Review of Systems was done, except as stated above, all other Review of Systems were negative.   With Past History of the following :    Past Medical History  Diagnosis Date  . Essential hypertension   . BPH (benign prostatic hyperplasia)   . Hard of hearing   . GERD (gastroesophageal reflux disease)   . Colon cancer [facility])       Past Surgical History  Procedure Laterality Date  . Colon surgery  1999    Sigmoid colon cancer, segmental resection by Drs. Smith/Bradford  . Colonoscopy  2002    Multiple adenomatous colon polyps  . Hernia repair  02/2011    Right inguinal hernia repair with mesh, Dr. Geroge Baseman  . Esophagogastroduodenoscopy  03/25/2012    Procedure: ESOPHAGOGASTRODUODENOSCOPY (EGD);  Surgeon: Daneil Dolin, MD;  Location: [facility];  Service: Endoscopy;  Laterality: Left;  . Cataract extraction w/phaco  01/18/2013    Procedure: CATARACT EXTRACTION PHACO AND INTRAOCULAR LENS PLACEMENT (IOC);  Surgeon: Tonny Branch, MD;  Location: [facility] ORS;  Service: Ophthalmology;  Laterality: Left;  CDE 17.11  . Cataract extraction w/phaco Right 02/05/2013    Procedure: CATARACT EXTRACTION PHACO AND INTRAOCULAR LENS PLACEMENT (IOC);  Surgeon: Tonny Branch, MD;  Location: [facility] ORS;  Service: Ophthalmology;  Laterality: Right;  CDE:19.49      Social History:  Social History  Substance Use Topics  . Smoking status: Former Smoker    Types: Cigarettes  . Smokeless tobacco: Current User    Types: Snuff  . Alcohol Use: No     Comment: previous heavy drinker at age [age]     Lives - at home  Mobility - independent    Family History :     Family History  Problem Relation Age of Onset  . Heart attack Father     Deceased, age 106s  . Colon cancer Neg Hx   . Liver disease Neg Hx      Home Medications:   Prior to Admission medications   Medication Sig Start Date End Date Taking? Authorizing Provider  diphenhydramine-acetaminophen (TYLENOL PM) 25-500 MG TABS tablet Take 2 tablets by mouth at bedtime as needed.   Yes Historical Provider, MD  HYDROcodone-acetaminophen (NORCO/VICODIN) 5-325 MG tablet Take 5-325 tablets by mouth 4 (four) times daily as needed. 05/13/16  Yes Historical Provider, MD  lisinopril (PRINIVIL,ZESTRIL) 20 MG tablet Take 1 tablet (20 mg total) by mouth daily. 03/27/12 05/28/16 Yes Sinda Du,  MD  metoprolol (LOPRESSOR) 50 MG tablet Take 50 mg by mouth daily. 05/08/16  Yes Historical Provider, MD  Multiple Vitamin (MULTIVITAMIN) capsule Take 1 capsule by mouth daily.   Yes Historical Provider, MD  omeprazole (PRILOSEC) 20 MG capsule Take 20 mg by mouth 2 (two) times daily. 05/09/16  Yes Historical Provider, MD  Tamsulosin HCl (FLOMAX) 0.4 MG CAPS Take 1 capsule (0.4 mg total) by mouth daily. 03/27/12  Yes Sinda Du, MD  omeprazole (PRILOSEC) 20 MG capsule Take 1 capsule (20 mg total) by mouth 2 (two) times daily. 03/27/12 03/27/13  Sinda Du, MD     Allergies:    No Known Allergies   Physical Exam:   Vitals  Blood pressure 185/67, pulse 41, temperature 97.5 F (36.4 C), temperature source Oral, resp. rate 13, height 5\' 5"  (1.651 m), weight 54.432 kg (120 lb), SpO2 100 %.   1. General frail elderly lying in bed in NAD,    2. Normal affect and insight, Not Suicidal or Homicidal, Awake Alert, Oriented X 3.  3. No F.N deficits, ALL C.Nerves Intact, Strength 5/5 all 4 extremities, Sensation intact all 4 extremities, Plantars down going.  4. Ears and Eyes appear Normal, Conjunctivae clear, PERRLA. Moist Oral Mucosa.  5. Supple Neck, No JVD, No cervical lymphadenopathy appriciated, No Carotid Bruits.  6. Symmetrical Chest wall movement, Good air movement bilaterally, CTAB.  7. bradycardia, No Gallops, Rubs or Murmurs, No Parasternal Heave.  8. Positive Bowel Sounds, Abdomen Soft, No tenderness, No organomegaly appriciated,No rebound -guarding or rigidity.  9.  No Cyanosis, Normal Skin Turgor, No Skin Rash or Bruise.  10. Good muscle tone,  joints appear normal , no effusions, Normal ROM.  11. No Palpable Lymph Nodes in Neck or Axillae     Data Review:    CBC  Recent Labs Lab 05/28/16 0903  WBC 6.9  HGB 12.1*  HCT 35.8*  PLT 158  MCV 91.3  MCH 30.9  MCHC 33.8  RDW 13.7  LYMPHSABS 0.9  MONOABS 0.6  EOSABS 0.1  BASOSABS 0.0    ------------------------------------------------------------------------------------------------------------------  Chemistries   Recent Labs Lab 05/28/16 0903  NA 136  K 5.5*  CL 113*  CO2 19*  GLUCOSE 103*  BUN 54*  CREATININE 1.66*  CALCIUM 9.4   ------------------------------------------------------------------------------------------------------------------ estimated creatinine clearance is 24.1 mL/min (by C-G formula based on Cr of 1.66). ------------------------------------------------------------------------------------------------------------------ No results  for input(s): TSH, T4TOTAL, T3FREE, THYROIDAB in the last 72 hours.  Invalid input(s): FREET3  Coagulation profile No results for input(s): INR, PROTIME in the last 168 hours. ------------------------------------------------------------------------------------------------------------------- No results for input(s): DDIMER in the last 72 hours. -------------------------------------------------------------------------------------------------------------------  Cardiac Enzymes  Recent Labs Lab 05/28/16 0903  TROPONINI <0.03   ------------------------------------------------------------------------------------------------------------------ No results found for: BNP   ---------------------------------------------------------------------------------------------------------------  Urinalysis    Component Value Date/Time   COLORURINE YELLOW 05/28/2016 [place] 05/28/2016 0955   LABSPEC 1.015 05/28/2016 0955   PHURINE 5.5 05/28/2016 [place] 05/28/2016 0955   HGBUR NEGATIVE 05/28/2016 0955   BILIRUBINUR NEGATIVE 05/28/2016 0955   KETONESUR NEGATIVE 05/28/2016 0955   PROTEINUR NEGATIVE 05/28/2016 0955   UROBILINOGEN 0.2 03/23/2012 2041   NITRITE NEGATIVE 05/28/2016 0955   LEUKOCYTESUR NEGATIVE 05/28/2016 0955     ----------------------------------------------------------------------------------------------------------------   Imaging Results:    Dg Chest 2 View  05/28/2016  CLINICAL DATA:  Generalized weakness, possible tick bite EXAM: CHEST  2 VIEW COMPARISON:  None. FINDINGS: The heart size and mediastinal contours are within normal limits. Both lungs are clear. The visualized skeletal structures shows degenerative change of the thoracic spine. IMPRESSION: No active cardiopulmonary disease. Electronically Signed   By: Inez Catalina M.D.   On: 05/28/2016 09:38    My personal review of EKG: RhythmSinus bradycardia, Rate  41 /min, QTc404, no Acute ST changes   Assessment & Plan:    Active Problems:   HTN (hypertension)   Bradycardia   Weakness   Acute renal failure (HCC)   Hyperkalemia  Generalized weakness - Patient with no focal deficits, this is most likely related to his bradycardia, hyperkalemia, tachycardia and dehydration, will continue with gentle hydration, will consult PT.  Bradycardia - Will admit to telemetry, continue to hold beta blockers, cardiology consult pending  Hypertension - We'll continue to hold beta blockers giving bradycardia, continue to hold lisinopril given acute renal failure, will start on when necessary hydralazine.  Acute renal failure - Secondary to dehydration, hold lisinopril, continue with gentle hydration  Hyperkalemia - Remains on telemetry, will give Kayexalate    DVT Prophylaxis Heparin   AM Labs Ordered, also please review Full Orders  Family Communication: Admission, patients condition and plan of care including tests being ordered have been discussed with the patient  who indicate understanding and agree with the plan and Code Status.  Code Status Full  Likely DC to Home  Condition GUARDED    Consults called: [email or web address]  Admission status: Obs  Time spent in minutes : 55 minutes   ELGERGAWY, DAWOOD M.D on 05/28/2016 at 11:40  AM  Between 7am to 7pm - Pager - [phone]. After 7pm go to [email or web address] - password [facility]  [facility] - Office  [phone]

## 2016-05-28 NOTE — Progress Notes (Signed)
*  PRELIMINARY RESULTS* Echocardiogram 2D Echocardiogram has been performed.  Leavy Cella 05/28/2016, 4:46 PM

## 2016-05-29 DIAGNOSIS — E860 Dehydration: Secondary | ICD-10-CM | POA: Diagnosis not present

## 2016-05-29 DIAGNOSIS — R001 Bradycardia, unspecified: Secondary | ICD-10-CM | POA: Diagnosis not present

## 2016-05-29 DIAGNOSIS — I10 Essential (primary) hypertension: Secondary | ICD-10-CM | POA: Diagnosis not present

## 2016-05-29 LAB — CBC
HCT: 32.6 % — ABNORMAL LOW (ref 39.0–52.0)
Hemoglobin: 11.1 g/dL — ABNORMAL LOW (ref 13.0–17.0)
MCH: 30.8 pg (ref 26.0–34.0)
MCHC: 34 g/dL (ref 30.0–36.0)
MCV: 90.6 fL (ref 78.0–100.0)
PLATELETS: 165 10*3/uL (ref 150–400)
RBC: 3.6 MIL/uL — AB (ref 4.22–5.81)
RDW: 13.4 % (ref 11.5–15.5)
WBC: 6.9 10*3/uL (ref 4.0–10.5)

## 2016-05-29 LAB — BASIC METABOLIC PANEL
Anion gap: 4 — ABNORMAL LOW (ref 5–15)
BUN: 39 mg/dL — AB (ref 6–20)
CALCIUM: 8.8 mg/dL — AB (ref 8.9–10.3)
CHLORIDE: 115 mmol/L — AB (ref 101–111)
CO2: 18 mmol/L — AB (ref 22–32)
CREATININE: 1.1 mg/dL (ref 0.61–1.24)
GFR calc non Af Amer: 58 mL/min — ABNORMAL LOW (ref >60)
GLUCOSE: 102 mg/dL — AB (ref 65–99)
Potassium: 4.9 mmol/L (ref 3.5–5.1)
Sodium: 137 mmol/L (ref 135–145)

## 2016-05-29 MED ORDER — LISINOPRIL 10 MG PO TABS
20.0000 mg | ORAL_TABLET | Freq: Every day | ORAL | Status: DC
  Administered 2016-05-29 – 2016-05-30 (×2): 20 mg via ORAL
  Filled 2016-05-29 (×3): qty 2

## 2016-05-29 NOTE — Progress Notes (Signed)
Subjective: Patient was admitted due to symptomatic bradycardia and prerenal azotemia. His lopressor is on hold and patient is being rehydrated. His BUN still around 40 and he is bradycardic.   Objective: Vital signs in last 24 hours: Temp:  [97.5 F (36.4 C)-97.6 F (36.4 C)] 97.5 F (36.4 C) (06/17 0509) Pulse Rate:  [42-49] 44 (06/17 0509) Resp:  [13-18] 18 (06/17 0509) BP: (155-196)/(54-72) 162/54 mmHg (06/17 0509) SpO2:  [97 %-100 %] 97 % (06/17 0509) Weight:  [53.524 kg (118 lb)-55.2 kg (121 lb 11.1 oz)] 53.524 kg (118 lb) (06/17 0649) Weight change:  Last BM Date: 05/28/16  Intake/Output from previous day: 06/16 0701 - 06/17 0700 In: 240 [P.O.:240] Out: -   PHYSICAL EXAM General appearance: alert and no distress Resp: clear to auscultation bilaterally Cardio: Si and S2 heard, bradycardic GI: soft, non-tender; bowel sounds normal; no masses,  no organomegaly Extremities: extremities normal, atraumatic, no cyanosis or edema  Lab Results:  Results for orders placed or performed during the hospital encounter of 05/28/16 (from the past 48 hour(s))  CBC with Differential     Status: Abnormal   Collection Time: 05/28/16  9:03 AM  Result Value Ref Range   WBC 6.9 4.0 - 10.5 K/uL   RBC 3.92 (L) 4.22 - 5.81 MIL/uL   Hemoglobin 12.1 (L) 13.0 - 17.0 g/dL   HCT 35.8 (L) 39.0 - 52.0 %   MCV 91.3 78.0 - 100.0 fL   MCH 30.9 26.0 - 34.0 pg   MCHC 33.8 30.0 - 36.0 g/dL   RDW 13.7 11.5 - 15.5 %   Platelets 158 150 - 400 K/uL   Neutrophils Relative % 79 %   Neutro Abs 5.4 1.7 - 7.7 K/uL   Lymphocytes Relative 12 %   Lymphs Abs 0.9 0.7 - 4.0 K/uL   Monocytes Relative 8 %   Monocytes Absolute 0.6 0.1 - 1.0 K/uL   Eosinophils Relative 1 %   Eosinophils Absolute 0.1 0.0 - 0.7 K/uL   Basophils Relative 0 %   Basophils Absolute 0.0 0.0 - 0.1 K/uL  Basic metabolic panel     Status: Abnormal   Collection Time: 05/28/16  9:03 AM  Result Value Ref Range   Sodium 136 135 - 145 mmol/L    Potassium 5.5 (H) 3.5 - 5.1 mmol/L   Chloride 113 (H) 101 - 111 mmol/L   CO2 19 (L) 22 - 32 mmol/L   Glucose, Bld 103 (H) 65 - 99 mg/dL   BUN 54 (H) 6 - 20 mg/dL   Creatinine, Ser 1.66 (H) 0.61 - 1.24 mg/dL   Calcium 9.4 8.9 - 10.3 mg/dL   GFR calc non Af Amer 35 (L) >60 mL/min   GFR calc Af Amer 41 (L) >60 mL/min    Comment: (NOTE) The eGFR has been calculated using the CKD EPI equation. This calculation has not been validated in all clinical situations. eGFR's persistently <60 mL/min signify possible Chronic Kidney Disease.    Anion gap 4 (L) 5 - 15  Troponin I     Status: None   Collection Time: 05/28/16  9:03 AM  Result Value Ref Range   Troponin I <0.03 <0.031 ng/mL    Comment:        NO INDICATION OF MYOCARDIAL INJURY.   Urinalysis, Routine w reflex microscopic (not at [facility])     Status: None   Collection Time: 05/28/16  9:55 AM  Result Value Ref Range   Color, Urine YELLOW YELLOW   APPearance  CLEAR CLEAR   Specific Gravity, Urine 1.015 1.005 - 1.030   pH 5.5 5.0 - 8.0   Glucose, UA NEGATIVE NEGATIVE mg/dL   Hgb urine dipstick NEGATIVE NEGATIVE   Bilirubin Urine NEGATIVE NEGATIVE   Ketones, ur NEGATIVE NEGATIVE mg/dL   Protein, ur NEGATIVE NEGATIVE mg/dL   Nitrite NEGATIVE NEGATIVE   Leukocytes, UA NEGATIVE NEGATIVE    Comment: MICROSCOPIC NOT DONE ON URINES WITH NEGATIVE PROTEIN, BLOOD, LEUKOCYTES, NITRITE, OR GLUCOSE <1000 mg/dL.  Basic metabolic panel     Status: Abnormal   Collection Time: 05/29/16  6:59 AM  Result Value Ref Range   Sodium 137 135 - 145 mmol/L   Potassium 4.9 3.5 - 5.1 mmol/L   Chloride 115 (H) 101 - 111 mmol/L   CO2 18 (L) 22 - 32 mmol/L   Glucose, Bld 102 (H) 65 - 99 mg/dL   BUN 39 (H) 6 - 20 mg/dL   Creatinine, Ser 1.10 0.61 - 1.24 mg/dL   Calcium 8.8 (L) 8.9 - 10.3 mg/dL   GFR calc non Af Amer 58 (L) >60 mL/min   GFR calc Af Amer >60 >60 mL/min    Comment: (NOTE) The eGFR has been calculated using the CKD EPI equation. This  calculation has not been validated in all clinical situations. eGFR's persistently <60 mL/min signify possible Chronic Kidney Disease.    Anion gap 4 (L) 5 - 15  CBC     Status: Abnormal   Collection Time: 05/29/16  6:59 AM  Result Value Ref Range   WBC 6.9 4.0 - 10.5 K/uL   RBC 3.60 (L) 4.22 - 5.81 MIL/uL   Hemoglobin 11.1 (L) 13.0 - 17.0 g/dL   HCT 32.6 (L) 39.0 - 52.0 %   MCV 90.6 78.0 - 100.0 fL   MCH 30.8 26.0 - 34.0 pg   MCHC 34.0 30.0 - 36.0 g/dL   RDW 13.4 11.5 - 15.5 %   Platelets 165 150 - 400 K/uL    ABGS No results for input(s): PHART, PO2ART, TCO2, HCO3 in the last 72 hours.  Invalid input(s): PCO2 CULTURES No results found for this or any previous visit (from the past 240 hour(s)). Studies/Results: Dg Chest 2 View  05/28/2016  CLINICAL DATA:  Generalized weakness, possible tick bite EXAM: CHEST  2 VIEW COMPARISON:  None. FINDINGS: The heart size and mediastinal contours are within normal limits. Both lungs are clear. The visualized skeletal structures shows degenerative change of the thoracic spine. IMPRESSION: No active cardiopulmonary disease. Electronically Signed   By: Inez Catalina M.D.   On: 05/28/2016 09:38    Medications: I have reviewed the patient's current medications.  Assesment:   Active Problems:   HTN (hypertension)   Bradycardia   Weakness   Acute renal failure (HCC)   Hyperkalemia    Plan:  Medications reviewed Continue telemetry Continue IV hydration Will monitor BMP Will restart lisinopril      Christianne Zacher 05/29/2016, 11:59 AM

## 2016-05-30 DIAGNOSIS — R001 Bradycardia, unspecified: Secondary | ICD-10-CM | POA: Diagnosis not present

## 2016-05-30 DIAGNOSIS — E860 Dehydration: Secondary | ICD-10-CM | POA: Diagnosis not present

## 2016-05-30 DIAGNOSIS — I10 Essential (primary) hypertension: Secondary | ICD-10-CM | POA: Diagnosis not present

## 2016-05-30 MED ORDER — AMLODIPINE BESYLATE 5 MG PO TABS: 5.0000 mg | ORAL_TABLET | Freq: Every day | ORAL | Status: DC

## 2016-05-30 MED ORDER — DIPHENHYDRAMINE HCL 25 MG PO CAPS
25.0000 mg | ORAL_CAPSULE | Freq: Every evening | ORAL | Status: DC | PRN
  Administered 2016-05-30: 25 mg via ORAL
  Filled 2016-05-30: qty 1

## 2016-05-30 NOTE — Care Management Obs Status (Deleted)
[place] NOTIFICATION   Patient Details  Name: Tony Holt MRN: [id number] Date of Birth: 1926/03/22   Medicare Observation Status Notification Given:  Yes    Briant Sites, RN 05/30/2016, 12:44 PM

## 2016-05-30 NOTE — Discharge Summary (Signed)
Physician Discharge Summary  Patient ID: Tony Holt MRN: [id number] DOB/AGE: [birth year] 82 y.o. Primary Care Physician:HAWKINS,EDWARD L, MD Admit date: 05/28/2016 Discharge date: 05/30/2016    Discharge Diagnoses:   Active Problems:   HTN (hypertension)   Bradycardia   Weakness   Acute renal failure (HCC)   Hyperkalemia     Medication List    STOP taking these medications        metoprolol 50 MG tablet  Commonly known as:  LOPRESSOR      TAKE these medications        amLODipine 5 MG tablet  Commonly known as:  NORVASC  Take 1 tablet (5 mg total) by mouth daily.     diphenhydramine-acetaminophen 25-500 MG Tabs tablet  Commonly known as:  TYLENOL PM  Take 2 tablets by mouth at bedtime as needed.     HYDROcodone-acetaminophen 5-325 MG tablet  Commonly known as:  NORCO/VICODIN  Take 5-325 tablets by mouth 4 (four) times daily as needed.     lisinopril 20 MG tablet  Commonly known as:  PRINIVIL,ZESTRIL  Take 1 tablet (20 mg total) by mouth daily.     multivitamin capsule  Take 1 capsule by mouth daily.     omeprazole 20 MG capsule  Commonly known as:  PRILOSEC  Take 1 capsule (20 mg total) by mouth 2 (two) times daily.     omeprazole 20 MG capsule  Commonly known as:  PRILOSEC  Take 20 mg by mouth 2 (two) times daily.     tamsulosin 0.4 MG Caps capsule  Commonly known as:  FLOMAX  Take 1 capsule (0.4 mg total) by mouth daily.        Discharged Condition: imrovved    Consults: cardiology  Significant Diagnostic Studies: Dg Chest 2 View  05/28/2016  CLINICAL DATA:  Generalized weakness, possible tick bite EXAM: CHEST  2 VIEW COMPARISON:  None. FINDINGS: The heart size and mediastinal contours are within normal limits. Both lungs are clear. The visualized skeletal structures shows degenerative change of the thoracic spine. IMPRESSION: No active cardiopulmonary disease. Electronically Signed   By: Inez Catalina M.D.   On: 05/28/2016 09:38    Lab  Results: Basic Metabolic Panel:  Recent Labs  05/28/16 0903 05/29/16 0659  NA 136 137  K 5.5* 4.9  CL 113* 115*  CO2 19* 18*  GLUCOSE 103* 102*  BUN 54* 39*  CREATININE 1.66* 1.10  CALCIUM 9.4 8.8*   Liver Function Tests: No results for input(s): AST, ALT, ALKPHOS, BILITOT, PROT, ALBUMIN in the last 72 hours.   CBC:  Recent Labs  05/28/16 0903 05/29/16 0659  WBC 6.9 6.9  NEUTROABS 5.4  --   HGB 12.1* 11.1*  HCT 35.8* 32.6*  MCV 91.3 90.6  PLT 158 165    No results found for this or any previous visit (from the past 240 hour(s)).   Hospital Course:   This is an 82 years old male with history of multiple medical illnesses who admitted due to symptomatic bradycardia and prerenal azotemia. His lopressor was stopped , he was rehydrated and monitored under telemetry. Patient was evaluated by cardiology and recommendation made. Patient improved and his betablocker discontinued and started on Norvasc. He was discharged in stable condition to be followed in out patient.   Discharge Exam: Blood pressure 146/84, pulse 49, temperature 98.4 F (36.9 C), temperature source Oral, resp. rate 20, height 5\' 9"  (1.753 m), weight 53.524 kg (118 lb), SpO2 99 %.  Disposition:  home        Follow-up Information    Follow up with HAWKINS,EDWARD L, MD In 2 weeks.   Specialty:  Pulmonary Disease   Contact information:   [place] [address] [phone]       Signed: Aarion Kittrell   05/30/2016, 9:57 AM

## 2016-05-30 NOTE — Care Management Obs Status (Signed)
[facility] NOTIFICATION   Patient Details  Name: Tony Holt MRN: [id number] Date of Birth: [birth year]   Medicare Observation Status Notification Given:  Yes    Briant Sites, RN 05/30/2016, 12:45 PM

## 2016-06-07 DIAGNOSIS — R001 Bradycardia, unspecified: Secondary | ICD-10-CM | POA: Diagnosis not present

## 2016-06-07 DIAGNOSIS — I10 Essential (primary) hypertension: Secondary | ICD-10-CM | POA: Diagnosis not present

## 2016-06-07 DIAGNOSIS — R1010 Upper abdominal pain, unspecified: Secondary | ICD-10-CM | POA: Diagnosis not present

## 2016-06-07 DIAGNOSIS — N401 Enlarged prostate with lower urinary tract symptoms: Secondary | ICD-10-CM | POA: Diagnosis not present

## 2016-09-27 DIAGNOSIS — K210 Gastro-esophageal reflux disease with esophagitis: Secondary | ICD-10-CM | POA: Diagnosis not present

## 2016-09-27 DIAGNOSIS — I10 Essential (primary) hypertension: Secondary | ICD-10-CM | POA: Diagnosis not present

## 2016-09-27 DIAGNOSIS — R001 Bradycardia, unspecified: Secondary | ICD-10-CM | POA: Diagnosis not present

## 2016-09-27 DIAGNOSIS — N401 Enlarged prostate with lower urinary tract symptoms: Secondary | ICD-10-CM | POA: Diagnosis not present

## 2016-09-27 DIAGNOSIS — Z23 Encounter for immunization: Secondary | ICD-10-CM | POA: Diagnosis not present

## 2017-01-28 DIAGNOSIS — N401 Enlarged prostate with lower urinary tract symptoms: Secondary | ICD-10-CM | POA: Diagnosis not present

## 2017-01-28 DIAGNOSIS — R1010 Upper abdominal pain, unspecified: Secondary | ICD-10-CM | POA: Diagnosis not present

## 2017-01-28 DIAGNOSIS — I10 Essential (primary) hypertension: Secondary | ICD-10-CM | POA: Diagnosis not present

## 2017-01-28 DIAGNOSIS — K210 Gastro-esophageal reflux disease with esophagitis: Secondary | ICD-10-CM | POA: Diagnosis not present

## 2017-05-30 DIAGNOSIS — Z112 Encounter for screening for other bacterial diseases: Secondary | ICD-10-CM | POA: Diagnosis not present

## 2017-05-30 DIAGNOSIS — Z0000 Encounter for general adult medical examination without abnormal findings: Secondary | ICD-10-CM | POA: Diagnosis not present

## 2017-05-31 DIAGNOSIS — Z0000 Encounter for general adult medical examination without abnormal findings: Secondary | ICD-10-CM | POA: Diagnosis not present

## 2017-05-31 DIAGNOSIS — R001 Bradycardia, unspecified: Secondary | ICD-10-CM | POA: Diagnosis not present

## 2017-05-31 DIAGNOSIS — M129 Arthropathy, unspecified: Secondary | ICD-10-CM | POA: Diagnosis not present

## 2017-05-31 DIAGNOSIS — H9193 Unspecified hearing loss, bilateral: Secondary | ICD-10-CM | POA: Diagnosis not present

## 2017-05-31 DIAGNOSIS — I10 Essential (primary) hypertension: Secondary | ICD-10-CM | POA: Diagnosis not present

## 2017-05-31 DIAGNOSIS — K219 Gastro-esophageal reflux disease without esophagitis: Secondary | ICD-10-CM | POA: Diagnosis not present

## 2017-10-04 DIAGNOSIS — K210 Gastro-esophageal reflux disease with esophagitis: Secondary | ICD-10-CM | POA: Diagnosis not present

## 2017-10-04 DIAGNOSIS — R1010 Upper abdominal pain, unspecified: Secondary | ICD-10-CM | POA: Diagnosis not present

## 2017-10-04 DIAGNOSIS — N401 Enlarged prostate with lower urinary tract symptoms: Secondary | ICD-10-CM | POA: Diagnosis not present

## 2017-10-04 DIAGNOSIS — Z23 Encounter for immunization: Secondary | ICD-10-CM | POA: Diagnosis not present

## 2017-10-04 DIAGNOSIS — I10 Essential (primary) hypertension: Secondary | ICD-10-CM | POA: Diagnosis not present

## 2018-01-13 DIAGNOSIS — Z79891 Long term (current) use of opiate analgesic: Secondary | ICD-10-CM | POA: Diagnosis not present

## 2018-02-06 DIAGNOSIS — R1010 Upper abdominal pain, unspecified: Secondary | ICD-10-CM | POA: Diagnosis not present

## 2018-02-06 DIAGNOSIS — N401 Enlarged prostate with lower urinary tract symptoms: Secondary | ICD-10-CM | POA: Diagnosis not present

## 2018-02-06 DIAGNOSIS — I10 Essential (primary) hypertension: Secondary | ICD-10-CM | POA: Diagnosis not present

## 2018-02-06 DIAGNOSIS — K219 Gastro-esophageal reflux disease without esophagitis: Secondary | ICD-10-CM | POA: Diagnosis not present

## 2018-05-31 DIAGNOSIS — H5200 Hypermetropia, unspecified eye: Secondary | ICD-10-CM | POA: Diagnosis not present

## 2018-05-31 DIAGNOSIS — H40009 Preglaucoma, unspecified, unspecified eye: Secondary | ICD-10-CM | POA: Diagnosis not present

## 2018-05-31 DIAGNOSIS — Z0100 Encounter for examination of eyes and vision without abnormal findings: Secondary | ICD-10-CM | POA: Diagnosis not present

## 2018-05-31 DIAGNOSIS — I10 Essential (primary) hypertension: Secondary | ICD-10-CM | POA: Diagnosis not present

## 2018-06-06 DIAGNOSIS — N401 Enlarged prostate with lower urinary tract symptoms: Secondary | ICD-10-CM | POA: Diagnosis not present

## 2018-06-06 DIAGNOSIS — H9193 Unspecified hearing loss, bilateral: Secondary | ICD-10-CM | POA: Diagnosis not present

## 2018-06-06 DIAGNOSIS — I10 Essential (primary) hypertension: Secondary | ICD-10-CM | POA: Diagnosis not present

## 2018-06-06 DIAGNOSIS — K219 Gastro-esophageal reflux disease without esophagitis: Secondary | ICD-10-CM | POA: Diagnosis not present

## 2018-06-27 DIAGNOSIS — H43813 Vitreous degeneration, bilateral: Secondary | ICD-10-CM | POA: Diagnosis not present

## 2018-06-27 DIAGNOSIS — H353122 Nonexudative age-related macular degeneration, left eye, intermediate dry stage: Secondary | ICD-10-CM | POA: Diagnosis not present

## 2018-06-27 DIAGNOSIS — H353213 Exudative age-related macular degeneration, right eye, with inactive scar: Secondary | ICD-10-CM | POA: Diagnosis not present

## 2018-08-11 DIAGNOSIS — Z79891 Long term (current) use of opiate analgesic: Secondary | ICD-10-CM | POA: Diagnosis not present

## 2018-08-28 DIAGNOSIS — Z0000 Encounter for general adult medical examination without abnormal findings: Secondary | ICD-10-CM | POA: Diagnosis not present

## 2018-08-31 DIAGNOSIS — R001 Bradycardia, unspecified: Secondary | ICD-10-CM | POA: Diagnosis not present

## 2018-08-31 DIAGNOSIS — N401 Enlarged prostate with lower urinary tract symptoms: Secondary | ICD-10-CM | POA: Diagnosis not present

## 2018-08-31 DIAGNOSIS — H9193 Unspecified hearing loss, bilateral: Secondary | ICD-10-CM | POA: Diagnosis not present

## 2018-08-31 DIAGNOSIS — I10 Essential (primary) hypertension: Secondary | ICD-10-CM | POA: Diagnosis not present

## 2018-08-31 DIAGNOSIS — I950 Idiopathic hypotension: Secondary | ICD-10-CM | POA: Diagnosis not present

## 2018-08-31 DIAGNOSIS — M129 Arthropathy, unspecified: Secondary | ICD-10-CM | POA: Diagnosis not present

## 2018-08-31 DIAGNOSIS — Z0000 Encounter for general adult medical examination without abnormal findings: Secondary | ICD-10-CM | POA: Diagnosis not present

## 2018-08-31 DIAGNOSIS — K219 Gastro-esophageal reflux disease without esophagitis: Secondary | ICD-10-CM | POA: Diagnosis not present

## 2018-11-27 DIAGNOSIS — R1010 Upper abdominal pain, unspecified: Secondary | ICD-10-CM | POA: Diagnosis not present

## 2018-11-27 DIAGNOSIS — N401 Enlarged prostate with lower urinary tract symptoms: Secondary | ICD-10-CM | POA: Diagnosis not present

## 2018-11-27 DIAGNOSIS — H9193 Unspecified hearing loss, bilateral: Secondary | ICD-10-CM | POA: Diagnosis not present

## 2018-11-27 DIAGNOSIS — I10 Essential (primary) hypertension: Secondary | ICD-10-CM | POA: Diagnosis not present

## 2018-11-27 DIAGNOSIS — Z23 Encounter for immunization: Secondary | ICD-10-CM | POA: Diagnosis not present

## 2019-02-26 DIAGNOSIS — D649 Anemia, unspecified: Secondary | ICD-10-CM | POA: Diagnosis not present

## 2019-02-26 DIAGNOSIS — I129 Hypertensive chronic kidney disease with stage 1 through stage 4 chronic kidney disease, or unspecified chronic kidney disease: Secondary | ICD-10-CM | POA: Diagnosis not present

## 2019-02-26 DIAGNOSIS — N401 Enlarged prostate with lower urinary tract symptoms: Secondary | ICD-10-CM | POA: Diagnosis not present

## 2019-02-26 DIAGNOSIS — R001 Bradycardia, unspecified: Secondary | ICD-10-CM | POA: Diagnosis not present

## 2019-02-27 DIAGNOSIS — I129 Hypertensive chronic kidney disease with stage 1 through stage 4 chronic kidney disease, or unspecified chronic kidney disease: Secondary | ICD-10-CM | POA: Diagnosis not present

## 2019-02-27 DIAGNOSIS — R001 Bradycardia, unspecified: Secondary | ICD-10-CM | POA: Diagnosis not present

## 2019-02-27 DIAGNOSIS — N401 Enlarged prostate with lower urinary tract symptoms: Secondary | ICD-10-CM | POA: Diagnosis not present

## 2019-02-27 DIAGNOSIS — D649 Anemia, unspecified: Secondary | ICD-10-CM | POA: Diagnosis not present

## 2019-03-07 ENCOUNTER — Encounter: Payer: Self-pay | Admitting: Internal Medicine

## 2019-05-05 DIAGNOSIS — R0902 Hypoxemia: Secondary | ICD-10-CM | POA: Diagnosis not present

## 2019-05-05 DIAGNOSIS — N390 Urinary tract infection, site not specified: Secondary | ICD-10-CM | POA: Diagnosis not present

## 2019-05-05 DIAGNOSIS — J690 Pneumonitis due to inhalation of food and vomit: Secondary | ICD-10-CM | POA: Diagnosis not present

## 2019-05-05 DIAGNOSIS — I959 Hypotension, unspecified: Secondary | ICD-10-CM | POA: Diagnosis not present

## 2019-05-05 DIAGNOSIS — S32019A Unspecified fracture of first lumbar vertebra, initial encounter for closed fracture: Secondary | ICD-10-CM | POA: Diagnosis not present

## 2019-05-05 DIAGNOSIS — N179 Acute kidney failure, unspecified: Secondary | ICD-10-CM | POA: Diagnosis not present

## 2019-05-05 DIAGNOSIS — R0602 Shortness of breath: Secondary | ICD-10-CM | POA: Diagnosis not present

## 2019-05-05 DIAGNOSIS — B9689 Other specified bacterial agents as the cause of diseases classified elsewhere: Secondary | ICD-10-CM | POA: Diagnosis not present

## 2019-05-05 DIAGNOSIS — R531 Weakness: Secondary | ICD-10-CM | POA: Diagnosis not present

## 2019-05-05 DIAGNOSIS — J189 Pneumonia, unspecified organism: Secondary | ICD-10-CM | POA: Diagnosis not present

## 2019-05-05 DIAGNOSIS — D509 Iron deficiency anemia, unspecified: Secondary | ICD-10-CM | POA: Diagnosis not present

## 2019-05-05 DIAGNOSIS — K819 Cholecystitis, unspecified: Secondary | ICD-10-CM | POA: Diagnosis not present

## 2019-05-05 DIAGNOSIS — W19XXXA Unspecified fall, initial encounter: Secondary | ICD-10-CM | POA: Diagnosis not present

## 2019-05-05 DIAGNOSIS — R05 Cough: Secondary | ICD-10-CM | POA: Diagnosis not present

## 2019-05-05 DIAGNOSIS — S22089A Unspecified fracture of T11-T12 vertebra, initial encounter for closed fracture: Secondary | ICD-10-CM | POA: Diagnosis not present

## 2019-05-06 ENCOUNTER — Inpatient Hospital Stay (HOSPITAL_COMMUNITY)
Admission: AD | Admit: 2019-05-06 | Payer: Self-pay | Source: Other Acute Inpatient Hospital | Admitting: Family Medicine

## 2019-05-09 DIAGNOSIS — M545 Low back pain: Secondary | ICD-10-CM | POA: Diagnosis not present

## 2019-05-14 DIAGNOSIS — I10 Essential (primary) hypertension: Secondary | ICD-10-CM | POA: Diagnosis not present

## 2019-05-14 DIAGNOSIS — J189 Pneumonia, unspecified organism: Secondary | ICD-10-CM | POA: Diagnosis not present

## 2019-05-14 DIAGNOSIS — M4850XA Collapsed vertebra, not elsewhere classified, site unspecified, initial encounter for fracture: Secondary | ICD-10-CM | POA: Diagnosis not present

## 2019-05-14 DIAGNOSIS — N401 Enlarged prostate with lower urinary tract symptoms: Secondary | ICD-10-CM | POA: Diagnosis not present

## 2019-05-17 ENCOUNTER — Ambulatory Visit (HOSPITAL_COMMUNITY)
Admission: RE | Admit: 2019-05-17 | Discharge: 2019-05-17 | Disposition: A | Discharge status: Home / Self Care | Payer: Medicare HMO | Source: Ambulatory Visit | Attending: Pulmonary Disease | Admitting: Pulmonary Disease

## 2019-05-17 ENCOUNTER — Ambulatory Visit: Payer: Medicare HMO | Admitting: Gastroenterology

## 2019-05-17 ENCOUNTER — Other Ambulatory Visit: Payer: Self-pay

## 2019-05-17 ENCOUNTER — Other Ambulatory Visit (HOSPITAL_COMMUNITY): Payer: Self-pay | Admitting: Pulmonary Disease

## 2019-05-17 ENCOUNTER — Encounter: Payer: Self-pay | Admitting: Gastroenterology

## 2019-05-17 DIAGNOSIS — D509 Iron deficiency anemia, unspecified: Secondary | ICD-10-CM | POA: Diagnosis not present

## 2019-05-17 DIAGNOSIS — J189 Pneumonia, unspecified organism: Secondary | ICD-10-CM | POA: Diagnosis not present

## 2019-05-17 DIAGNOSIS — J449 Chronic obstructive pulmonary disease, unspecified: Secondary | ICD-10-CM | POA: Diagnosis not present

## 2019-05-17 DIAGNOSIS — J9811 Atelectasis: Secondary | ICD-10-CM | POA: Diagnosis not present

## 2019-05-17 DIAGNOSIS — D500 Iron deficiency anemia secondary to blood loss (chronic): Secondary | ICD-10-CM | POA: Insufficient documentation

## 2019-05-17 DIAGNOSIS — K5900 Constipation, unspecified: Secondary | ICD-10-CM

## 2019-05-17 NOTE — Patient Instructions (Signed)
1. For constipation start Miralax one capful daily. Once you have regular soft bowel movements, you can take one capful of Miralax on days you have not had a good BM. 2. Continue over-the-counter iron per directions on the bottle. 3. I will review records from recent hospitalization.  We will contact you with next step in work-up.

## 2019-05-17 NOTE — Assessment & Plan Note (Addendum)
Very pleasant 85 year old gentleman with history of remote colon cancer status post resection who presents for further evaluation of iron deficiency anemia.  We have requested Hemoccult results, patient believes he was heme positive.  Denies overt GI bleeding.  He has had symptomatic anemia recently in the setting of pneumonia.  Hospitalized in [place] but states that he did not get blood transfusions.  From a GI standpoint he has some mild constipation.  He reports that his reflux is well controlled.  EGD in 2013 showed ulcerative erosive reflux esophagitis and abnormal gastric and duodenal mucosa with benign findings.  Discussed at length with patient, I would like to get a copy of most recent labs as the ones we have are 42 months old.  Sounds like he did not require blood transfusion so that may be an indication that his hemoglobin has improved. Potentially will offer him an upper endoscopy as the next step.  Will discuss with Dr. Gala Romney.  Patient is not opposed to EGD or colonoscopy but states that he does not plan to have any type of surgical procedures no matter what the findings are.  Further recommendations to follow review of recent labs and Hemoccults.  Patient and daughter, Tony Holt, in agreement with plan.

## 2019-05-17 NOTE — Assessment & Plan Note (Signed)
Start MiraLAX 1 capful daily.  Once he has regular soft BMS, he can transition to daily as needed.

## 2019-05-17 NOTE — Progress Notes (Signed)
Primary Care Physician:  Sinda Du, MD  Primary Gastroenterologist:  Garfield Cornea, MD   Chief Complaint  Patient presents with  . Anemia    w/ low iron levels.   . Constipation    BM ? every 3 days and will have to use an enema, no straining with BM    HPI:  Tony Holt is a 85 y.o. male here at the request of Dr. Luan Pulling for further evaluation of IDA.  Patient has a remote history of colon cancer over 2 decades ago.  His last colonoscopy was in 2002 at which time he had multiple adenomatous colon polyps.  Patient presents with his daughter, Tony Holt, today because he is significantly hard of hearing.  She provides assistance with history.  Blood work back in March showed hemoglobin 8.6, hematocrit 27.6, MCV iron was 17, TIBC 337, iron saturations 5%.  Patient reports completing Hemoccults and he believes these were positive but I do not have these records.  We requested.  He was hospitalized 2 weeks ago at [facility] for pneumonia.  We requested those records as well.  Denies being transfused while there.  He has chronic constipation, generally has a bowel movement about every 2 to 3 days, sometimes has to use an enema.  He denies melena or rectal bleeding.  His appetite is adequate.  No weight loss.  No heartburn or dysphagia.  He has been taking over-the-counter iron daily.  Current Outpatient Medications  Medication Sig Dispense Refill  . amLODipine (NORVASC) 5 MG tablet Take 1 tablet (5 mg total) by mouth daily. 30 tablet 0  . diphenhydramine-acetaminophen (TYLENOL PM) 25-500 MG TABS tablet Take 2 tablets by mouth at bedtime as needed.    . ferrous sulfate 325 (65 FE) MG tablet Take 325 mg by mouth daily.    Marland Kitchen HYDROcodone-acetaminophen (NORCO/VICODIN) 5-325 MG tablet Take 5-325 tablets by mouth every 12 (twelve) hours as needed.   0  . lidocaine (LIDODERM) 5 % as needed. forback pain    . omeprazole (PRILOSEC) 20 MG capsule Take 20 mg by mouth 2 (two) times daily.  11  .  Sodium Phosphates (ENEMA RE) Place rectally. As needed    . Tamsulosin HCl (FLOMAX) 0.4 MG CAPS Take 1 capsule (0.4 mg total) by mouth daily. 30 capsule 12  . lisinopril (PRINIVIL,ZESTRIL) 20 MG tablet Take 1 tablet (20 mg total) by mouth daily. (Patient not taking: Reported on 05/17/2019) 30 tablet 12  . omeprazole (PRILOSEC) 20 MG capsule Take 1 capsule (20 mg total) by mouth 2 (two) times daily. (Patient not taking: Reported on 05/17/2019) 60 capsule 12   No current facility-administered medications for this visit.     Allergies as of 05/17/2019  . (No Known Allergies)    Past Medical History:  Diagnosis Date  . BPH (benign prostatic hyperplasia)   . Colon cancer ([place]) 1999  . Essential hypertension   . GERD (gastroesophageal reflux disease)   . Hard of hearing     Past Surgical History:  Procedure Laterality Date  . CATARACT EXTRACTION W/PHACO  01/18/2013   Procedure: CATARACT EXTRACTION PHACO AND INTRAOCULAR LENS PLACEMENT (IOC);  Surgeon: Tonny Branch, MD;  Location: [facility];  Service: Ophthalmology;  Laterality: Left;  CDE 17.11  . CATARACT EXTRACTION W/PHACO Right 02/05/2013   Procedure: CATARACT EXTRACTION PHACO AND INTRAOCULAR LENS PLACEMENT (IOC);  Surgeon: Tonny Branch, MD;  Location: [facility];  Service: Ophthalmology;  Laterality: Right;  CDE:19.49  . [place]  Sigmoid colon cancer, segmental resection by Drs. Smith/Bradford  . COLONOSCOPY  2002   Multiple adenomatous colon polyps  . ESOPHAGOGASTRODUODENOSCOPY  03/25/2012   Dr. Gala Romney: Severe ulcerative/erosive reflux esophagitis.  Small hiatal hernia.  Abnormal gastric and duodenal mucosa, biopsy showed reactive changes and minimal chronic inflammation but no H. pylori.  Marland Kitchen HERNIA REPAIR  02/2011   Right inguinal hernia repair with mesh, Dr. Geroge Baseman  . HERNIA REPAIR     three total    Family History  Problem Relation Age of Onset  . Heart attack Father        Deceased, age 7s  . Colon cancer Neg Hx   . Liver  disease Neg Hx     Social History   Socioeconomic History  . Marital status: Widowed    Spouse name: Not on file  . Number of children: 5  . Years of education: Not on file  . Highest education level: Not on file  Occupational History  . Occupation: Retired Neurosurgeon: RETIRED  Social Needs  . Financial resource strain: Not on file  . Food insecurity:    Worry: Not on file    Inability: Not on file  . Transportation needs:    Medical: Not on file    Non-medical: Not on file  Tobacco Use  . Smoking status: Former Smoker    Types: Cigarettes  . Smokeless tobacco: Current User    Types: Snuff  Substance and Sexual Activity  . Alcohol use: No    Alcohol/week: 0.0 standard drinks    Comment: previous heavy drinker at age 37-32  . Drug use: No  . Sexual activity: Not on file  Lifestyle  . Physical activity:    Days per week: Not on file    Minutes per session: Not on file  . Stress: Not on file  Relationships  . Social connections:    Talks on phone: Not on file    Gets together: Not on file    Attends religious service: Not on file    Active member of club or organization: Not on file    Attends meetings of clubs or organizations: Not on file    Relationship status: Not on file  . Intimate partner violence:    Fear of current or ex partner: Not on file    Emotionally abused: Not on file    Physically abused: Not on file    Forced sexual activity: Not on file  Other Topics Concern  . Not on file  Social History Narrative  . Not on file      ROS:  General: Negative for anorexia, weight loss, fever, chills, fatigue, positive weakness.  Some recent lightheadedness. Eyes: Negative for vision changes.  ENT: Negative for hoarseness, difficulty swallowing , nasal congestion. CV: Negative for chest pain, angina, palpitations, dyspnea on exertion, peripheral edema.  Respiratory: Negative for dyspnea at rest, dyspnea on exertion, cough, sputum, wheezing.   GI: See history of present illness. GU:  Negative for dysuria, hematuria, urinary incontinence, urinary frequency, nocturnal urination.  MS: Negative for joint pain, low back pain.  Derm: Negative for rash or itching.  Neuro: Negative for weakness, abnormal sensation, seizure, frequent headaches, memory loss, confusion.  Psych: Negative for anxiety, depression, suicidal ideation, hallucinations.  Endo: Negative for unusual weight change.  Heme: Negative for bruising or bleeding. Allergy: Negative for rash or hives.    Physical Examination:  BP (!) 110/55   Pulse (!) 59  Temp (!) 97 F (36.1 C) (Oral)   Ht 5\' 8"  (1.727 m)   Wt 130 lb (59 kg)   BMI 19.77 kg/m    General: Well-nourished, well-developed in no acute distress.  Hard of hearing.  Accompanied by his daughter. Head: Normocephalic, atraumatic.   Eyes: Conjunctiva pink, no icterus. Mouth: Oropharyngeal mucosa moist and pink , no lesions erythema or exudate. Neck: Supple without thyromegaly, masses, or lymphadenopathy.  Lungs: Clear to auscultation bilaterally.  Heart: Regular rate and rhythm, no murmurs rubs or gallops.  Abdomen: Bowel sounds are normal, nontender, nondistended, no hepatosplenomegaly or masses, no abdominal bruits or    hernia , no rebound or guarding.   Rectal: Not performed Extremities: No lower extremity edema. No clubbing or deformities.  Neuro: Alert and oriented x 4 , grossly normal neurologically.  Skin: Warm and dry, no rash or jaundice.   Psych: Alert and cooperative, normal mood and affect.

## 2019-05-18 NOTE — Progress Notes (Signed)
CC'D TO PCP °

## 2019-06-21 ENCOUNTER — Other Ambulatory Visit: Payer: Self-pay

## 2019-06-21 ENCOUNTER — Other Ambulatory Visit (HOSPITAL_COMMUNITY): Payer: Self-pay | Admitting: Pulmonary Disease

## 2019-06-21 ENCOUNTER — Ambulatory Visit (HOSPITAL_COMMUNITY)
Admission: RE | Admit: 2019-06-21 | Discharge: 2019-06-21 | Disposition: A | Discharge status: Home / Self Care | Payer: Medicare HMO | Source: Ambulatory Visit | Attending: Pulmonary Disease | Admitting: Pulmonary Disease

## 2019-06-21 DIAGNOSIS — R05 Cough: Secondary | ICD-10-CM | POA: Diagnosis not present

## 2019-06-21 DIAGNOSIS — R0602 Shortness of breath: Secondary | ICD-10-CM | POA: Diagnosis not present

## 2019-06-21 DIAGNOSIS — D509 Iron deficiency anemia, unspecified: Secondary | ICD-10-CM | POA: Diagnosis not present

## 2019-06-21 DIAGNOSIS — J189 Pneumonia, unspecified organism: Secondary | ICD-10-CM

## 2019-06-21 DIAGNOSIS — N401 Enlarged prostate with lower urinary tract symptoms: Secondary | ICD-10-CM | POA: Diagnosis not present

## 2019-06-21 DIAGNOSIS — I10 Essential (primary) hypertension: Secondary | ICD-10-CM | POA: Diagnosis not present

## 2019-07-04 DIAGNOSIS — N401 Enlarged prostate with lower urinary tract symptoms: Secondary | ICD-10-CM | POA: Diagnosis not present

## 2019-07-04 DIAGNOSIS — J189 Pneumonia, unspecified organism: Secondary | ICD-10-CM | POA: Diagnosis not present

## 2019-07-04 DIAGNOSIS — D509 Iron deficiency anemia, unspecified: Secondary | ICD-10-CM | POA: Diagnosis not present

## 2019-07-04 DIAGNOSIS — I10 Essential (primary) hypertension: Secondary | ICD-10-CM | POA: Diagnosis not present

## 2019-08-02 DIAGNOSIS — I129 Hypertensive chronic kidney disease with stage 1 through stage 4 chronic kidney disease, or unspecified chronic kidney disease: Secondary | ICD-10-CM | POA: Diagnosis not present

## 2019-08-02 DIAGNOSIS — R1010 Upper abdominal pain, unspecified: Secondary | ICD-10-CM | POA: Diagnosis not present

## 2019-08-02 DIAGNOSIS — M545 Low back pain: Secondary | ICD-10-CM | POA: Diagnosis not present

## 2019-08-02 DIAGNOSIS — D649 Anemia, unspecified: Secondary | ICD-10-CM | POA: Diagnosis not present

## 2019-10-31 DIAGNOSIS — M545 Low back pain: Secondary | ICD-10-CM | POA: Diagnosis not present

## 2019-10-31 DIAGNOSIS — I10 Essential (primary) hypertension: Secondary | ICD-10-CM | POA: Diagnosis not present

## 2019-10-31 DIAGNOSIS — I129 Hypertensive chronic kidney disease with stage 1 through stage 4 chronic kidney disease, or unspecified chronic kidney disease: Secondary | ICD-10-CM | POA: Diagnosis not present

## 2019-10-31 DIAGNOSIS — Z23 Encounter for immunization: Secondary | ICD-10-CM | POA: Diagnosis not present

## 2019-10-31 DIAGNOSIS — I951 Orthostatic hypotension: Secondary | ICD-10-CM | POA: Diagnosis not present

## 2019-11-15 DIAGNOSIS — D649 Anemia, unspecified: Secondary | ICD-10-CM | POA: Diagnosis not present

## 2019-11-15 DIAGNOSIS — I129 Hypertensive chronic kidney disease with stage 1 through stage 4 chronic kidney disease, or unspecified chronic kidney disease: Secondary | ICD-10-CM | POA: Diagnosis not present

## 2019-11-15 DIAGNOSIS — I951 Orthostatic hypotension: Secondary | ICD-10-CM | POA: Diagnosis not present

## 2019-11-15 DIAGNOSIS — N183 Chronic kidney disease, stage 3 unspecified: Secondary | ICD-10-CM | POA: Diagnosis not present

## 2019-11-16 DIAGNOSIS — N183 Chronic kidney disease, stage 3 unspecified: Secondary | ICD-10-CM | POA: Diagnosis not present

## 2019-11-16 DIAGNOSIS — I951 Orthostatic hypotension: Secondary | ICD-10-CM | POA: Diagnosis not present

## 2019-11-16 DIAGNOSIS — D649 Anemia, unspecified: Secondary | ICD-10-CM | POA: Diagnosis not present

## 2019-11-16 DIAGNOSIS — I129 Hypertensive chronic kidney disease with stage 1 through stage 4 chronic kidney disease, or unspecified chronic kidney disease: Secondary | ICD-10-CM | POA: Diagnosis not present

## 2019-11-21 ENCOUNTER — Telehealth: Payer: Self-pay | Admitting: Gastroenterology

## 2019-11-21 NOTE — Telephone Encounter (Signed)
Please let pt know or daughter know, we were never able to get copy of his labs from [facility] since he was here. Since it has been several months, I would offer recheck labs due to anemia. CBC, iron/tibc/ferritin.

## 2019-11-22 NOTE — Telephone Encounter (Signed)
Spoke with pts daughter. Pt has had lab work done last week and another time since hisp appointment. Pt's daughter would like our office to requests lab results from Dr. Luan Pulling.

## 2019-11-22 NOTE — Telephone Encounter (Signed)
noted 

## 2019-11-22 NOTE — Telephone Encounter (Signed)
I have requested most recent labs from Dr Luan Pulling' office.

## 2019-11-29 NOTE — Telephone Encounter (Signed)
Spoke with pts daughter. She was notified of her fathers lab results. Pt was scheduled for evaluation with RMR on 12/21/2019 @ 8:30 AM.

## 2019-11-29 NOTE — Telephone Encounter (Signed)
Received labs dated 11/16/2019.  White blood cell count 5900, hemoglobin 8.5, hematocrit 27.3, MCV 86.7, platelets 236,000, ferritin 9.  Please let patient's daughter know that he he still has significant iron deficiency anemia with hemoglobin of 8.5 and ferritin of 9.  Would offer patient to follow-up in the office with RMR only to discuss potential work-up if patient agreeable.  Potential work-up could include upper endoscopy and/or colonoscopy but I would like RMR advice because of patient's age.

## 2019-12-21 ENCOUNTER — Ambulatory Visit: Payer: Medicare HMO | Admitting: Internal Medicine

## 2019-12-21 ENCOUNTER — Other Ambulatory Visit: Payer: Self-pay

## 2019-12-21 ENCOUNTER — Telehealth: Payer: Self-pay

## 2019-12-21 ENCOUNTER — Encounter: Payer: Self-pay | Admitting: Internal Medicine

## 2019-12-21 VITALS — BP 140/85 | HR 85 | Temp 97.0°F | Ht 68.0 in | Wt 133.0 lb

## 2019-12-21 DIAGNOSIS — D509 Iron deficiency anemia, unspecified: Secondary | ICD-10-CM

## 2019-12-21 DIAGNOSIS — K921 Melena: Secondary | ICD-10-CM | POA: Diagnosis not present

## 2019-12-21 NOTE — Telephone Encounter (Signed)
Dr. Gala Romney wants pt to have TCS/-/+EGD w/conscious sedation within next 2 weeks.  Routing to CM to assist with scheduling.

## 2019-12-21 NOTE — H&P (View-Only) (Signed)
Primary Care Physician:  Sinda Du, MD Primary Gastroenterologist:  Dr. Gala Romney  Pre-Procedure History & Physical: HPI:  Tony Holt is a 86 y.o. male here for further evaluation of iron deficiency anemia.  Patient has presented with bona fide iron deficiency anemia and reportedly Hemoccult positive.  Energy level apparently not as good as it has been according to daughter who accompanies him today.  Distant history of sigmoid colon cancer -  status post resection.  Multiple colonic adenomas removed subsequently at surveillance colonoscopy.  No follow-up colonoscopy in many years.  History of ulcerative reflux esophagitis previously.  Reflux symptoms well controlled on omeprazole 20 mg twice daily.  Patient denies melena, rectal bleeding or other bowel symptoms;  no abdominal pain -  some early satiety endorsed by patient.  No dysphagia.  Weight is actually up 3 pounds since last seen. Minimal gastric inflammation seen in 2013 EGD.  Histology negative for H. pylori.  Past Medical History:  Diagnosis Date  . BPH (benign prostatic hyperplasia)   . Colon cancer ([place]) 1999  . Essential hypertension   . GERD (gastroesophageal reflux disease)   . Hard of hearing     Past Surgical History:  Procedure Laterality Date  . CATARACT EXTRACTION W/PHACO  01/18/2013   Procedure: CATARACT EXTRACTION PHACO AND INTRAOCULAR LENS PLACEMENT (IOC);  Surgeon: Tonny Branch, MD;  Location: [facility];  Service: Ophthalmology;  Laterality: Left;  CDE 17.11  . CATARACT EXTRACTION W/PHACO Right 02/05/2013   Procedure: CATARACT EXTRACTION PHACO AND INTRAOCULAR LENS PLACEMENT (IOC);  Surgeon: Tonny Branch, MD;  Location: [facility];  Service: Ophthalmology;  Laterality: Right;  CDE:19.49  . COLON SURGERY  1999   Sigmoid colon cancer, segmental resection by Drs. Smith/Bradford  . COLONOSCOPY  2002   Multiple adenomatous colon polyps  . ESOPHAGOGASTRODUODENOSCOPY  03/25/2012   Dr. Gala Romney: Severe ulcerative/erosive reflux  esophagitis.  Small hiatal hernia.  Abnormal gastric and duodenal mucosa, biopsy showed reactive changes and minimal chronic inflammation but no H. pylori.  Marland Kitchen HERNIA REPAIR  02/2011   Right inguinal hernia repair with mesh, Dr. Geroge Baseman  . HERNIA REPAIR     three total    Prior to Admission medications   Medication Sig Start Date End Date Taking? Authorizing Provider  amLODipine (NORVASC) 5 MG tablet Take 1 tablet (5 mg total) by mouth daily. 05/30/16  Yes Rosita Fire, MD  HYDROcodone-acetaminophen (NORCO/VICODIN) 5-325 MG tablet Take 5-325 tablets by mouth every 12 (twelve) hours as needed.  05/13/16  Yes [provider]  lidocaine (LIDODERM) 5 % as needed. forback pain 05/08/19  Yes [provider]  omeprazole (PRILOSEC) 20 MG capsule Take 20 mg by mouth 2 (two) times daily. 05/09/16  Yes [provider]  Pediatric Multiple Vitamins (FLINTSTONES MULTIVITAMIN PO) Take 1 tablet by mouth daily. Has iron in this   Yes [provider]  Sodium Phosphates (ENEMA RE) Place rectally. As needed   Yes [provider]  Tamsulosin HCl (FLOMAX) 0.4 MG CAPS Take 1 capsule (0.4 mg total) by mouth daily. 03/27/12  Yes Sinda Du, MD    Allergies as of 12/21/2019  . (No Known Allergies)    Family History  Problem Relation Age of Onset  . Heart attack Father        Deceased, age 62s  . Colon cancer Neg Hx   . Liver disease Neg Hx     Social History   Socioeconomic History  . Marital status: Widowed    Spouse  name: Not on file  . Number of children: 5  . Years of education: Not on file  . Highest education level: Not on file  Occupational History  . Occupation: Retired Neurosurgeon: RETIRED  Tobacco Use  . Smoking status: Former Smoker    Types: Cigarettes  . Smokeless tobacco: Current User    Types: Snuff  Substance and Sexual Activity  . Alcohol use: No    Alcohol/week: 0.0 standard drinks    Comment: previous heavy drinker at  age 26-32  . Drug use: No  . Sexual activity: Not on file  Other Topics Concern  . Not on file  Social History Narrative  . Not on file   Social Determinants of Health   Financial Resource Strain:   . Difficulty of Paying Living Expenses: Not on file  Food Insecurity:   . Worried About Charity fundraiser in the Last Year: Not on file  . Ran Out of Food in the Last Year: Not on file  Transportation Needs:   . Lack of Transportation (Medical): Not on file  . Lack of Transportation (Non-Medical): Not on file  Physical Activity:   . Days of Exercise per Week: Not on file  . Minutes of Exercise per Session: Not on file  Stress:   . Feeling of Stress : Not on file  Social Connections:   . Frequency of Communication with Friends and Family: Not on file  . Frequency of Social Gatherings with Friends and Family: Not on file  . Attends Religious Services: Not on file  . Active Member of Clubs or Organizations: Not on file  . Attends Archivist Meetings: Not on file  . Marital Status: Not on file  Intimate Partner Violence:   . Fear of Current or Ex-Partner: Not on file  . Emotionally Abused: Not on file  . Physically Abused: Not on file  . Sexually Abused: Not on file    Review of Systems: See HPI, otherwise negative ROS  Physical Exam: BP 140/85   Pulse 85   Temp (!) 97 F (36.1 C) (Oral)   Ht 5\' 8"  (1.727 m)   Wt 133 lb (60.3 kg)   BMI 20.22 kg/m  General:   Alert,   pleasant and cooperative in NAD Eyes:  Sclera clear, no icterus.  Pale Lungs:  Clear throughout to auscultation.   No wheezes, crackles, or rhonchi. No acute distress. Heart:  Regular rate and rhythm; no murmurs, clicks, rubs,  or gallops. Abdomen: Non-distended, normal bowel sounds.  Soft and nontender without appreciable mass or hepatosplenomegaly.  Pulses:  Normal pulses noted. Extremities:  Without clubbing or edema.  Impression/Plan: 86 year old gentleman with established iron deficiency  anemia, history of Hemoccult positive stool, distant history of sigmoid colon cancer.  History of complicated GERD. Clinically, he has some early satiety and weakness-fairly nonspecific.  No overt GI bleeding.  I discussed the standard approach of colonoscopy with a possible EGD to follow in this clinical setting..  Had a lengthy discussion about the risk benefits of invasive procedures given his advanced age.  He is in fairly good shape otherwise.  Patient and daughter are not sure if they would go to the extent of surgery should an occult lesion in his colon be found requiring such intervention.  However, they are concerned regarding the underlying cause of his anemia and bleeding..  They are interested in determining the etiology of his anemia and occult blood.  Further decision-making  would follow.  Recommendations: Therefore, will offer the patient a diagnostic colonoscopy possible EGD to follow utilizing conscious sedation in the near future.  The risks, benefits, limitations, imponderables and alternatives regarding both EGD and colonoscopy have been reviewed with the patient. Questions have been answered. All parties agreeable.    Notice: This dictation was prepared with Dragon dictation along with smaller phrase technology. Any transcriptional errors that result from this process are unintentional and may not be corrected upon review.

## 2019-12-21 NOTE — Progress Notes (Signed)
Primary Care Physician:  Sinda Du, MD Primary Gastroenterologist:  Dr. Gala Romney  Pre-Procedure History & Physical: HPI:  Tony Holt is a 86 y.o. male here for further evaluation of iron deficiency anemia.  Patient has presented with bona fide iron deficiency anemia and reportedly Hemoccult positive.  Energy level apparently not as good as it has been according to daughter who accompanies him today.  Distant history of sigmoid colon cancer -  status post resection.  Multiple colonic adenomas removed subsequently at surveillance colonoscopy.  No follow-up colonoscopy in many years.  History of ulcerative reflux esophagitis previously.  Reflux symptoms well controlled on omeprazole 20 mg twice daily.  Patient denies melena, rectal bleeding or other bowel symptoms;  no abdominal pain -  some early satiety endorsed by patient.  No dysphagia.  Weight is actually up 3 pounds since last seen. Minimal gastric inflammation seen in 2013 EGD.  Histology negative for H. pylori.  Past Medical History:  Diagnosis Date  . BPH (benign prostatic hyperplasia)   . Colon cancer ([place]) 1999  . Essential hypertension   . GERD (gastroesophageal reflux disease)   . Hard of hearing     Past Surgical History:  Procedure Laterality Date  . CATARACT EXTRACTION W/PHACO  01/18/2013   Procedure: CATARACT EXTRACTION PHACO AND INTRAOCULAR LENS PLACEMENT (IOC);  Surgeon: Tonny Branch, MD;  Location: [facility];  Service: Ophthalmology;  Laterality: Left;  CDE 17.11  . CATARACT EXTRACTION W/PHACO Right 02/05/2013   Procedure: CATARACT EXTRACTION PHACO AND INTRAOCULAR LENS PLACEMENT (IOC);  Surgeon: Tonny Branch, MD;  Location: [facility];  Service: Ophthalmology;  Laterality: Right;  CDE:19.49  . COLON SURGERY  1999   Sigmoid colon cancer, segmental resection by Drs. Smith/Bradford  . COLONOSCOPY  2002   Multiple adenomatous colon polyps  . ESOPHAGOGASTRODUODENOSCOPY  03/25/2012   Dr. Gala Romney: Severe ulcerative/erosive reflux  esophagitis.  Small hiatal hernia.  Abnormal gastric and duodenal mucosa, biopsy showed reactive changes and minimal chronic inflammation but no H. pylori.  Marland Kitchen HERNIA REPAIR  02/2011   Right inguinal hernia repair with mesh, Dr. Geroge Baseman  . HERNIA REPAIR     three total    Prior to Admission medications   Medication Sig Start Date End Date Taking? Authorizing Provider  amLODipine (NORVASC) 5 MG tablet Take 1 tablet (5 mg total) by mouth daily. 05/30/16  Yes Rosita Fire, MD  HYDROcodone-acetaminophen (NORCO/VICODIN) 5-325 MG tablet Take 5-325 tablets by mouth every 12 (twelve) hours as needed.  05/13/16  Yes [provider]  lidocaine (LIDODERM) 5 % as needed. forback pain 05/08/19  Yes [provider]  omeprazole (PRILOSEC) 20 MG capsule Take 20 mg by mouth 2 (two) times daily. 05/09/16  Yes [provider]  Pediatric Multiple Vitamins (FLINTSTONES MULTIVITAMIN PO) Take 1 tablet by mouth daily. Has iron in this   Yes [provider]  Sodium Phosphates (ENEMA RE) Place rectally. As needed   Yes [provider]  Tamsulosin HCl (FLOMAX) 0.4 MG CAPS Take 1 capsule (0.4 mg total) by mouth daily. 03/27/12  Yes Sinda Du, MD    Allergies as of 12/21/2019  . (No Known Allergies)    Family History  Problem Relation Age of Onset  . Heart attack Father        Deceased, age 20s  . Colon cancer Neg Hx   . Liver disease Neg Hx     Social History   Socioeconomic History  . Marital status: Widowed    Spouse  name: Not on file  . Number of children: 5  . Years of education: Not on file  . Highest education level: Not on file  Occupational History  . Occupation: Retired Neurosurgeon: RETIRED  Tobacco Use  . Smoking status: Former Smoker    Types: Cigarettes  . Smokeless tobacco: Current User    Types: Snuff  Substance and Sexual Activity  . Alcohol use: No    Alcohol/week: 0.0 standard drinks    Comment: previous heavy drinker at  age [age]  . Drug use: No  . Sexual activity: Not on file  Other Topics Concern  . Not on file  Social History Narrative  . Not on file   Social Determinants of Health   Financial Resource Strain:   . Difficulty of Paying Living Expenses: Not on file  Food Insecurity:   . Worried About Charity fundraiser in the Last Year: Not on file  . Ran Out of Food in the Last Year: Not on file  Transportation Needs:   . Lack of Transportation (Medical): Not on file  . Lack of Transportation (Non-Medical): Not on file  Physical Activity:   . Days of Exercise per Week: Not on file  . Minutes of Exercise per Session: Not on file  Stress:   . Feeling of Stress : Not on file  Social Connections:   . Frequency of Communication with Friends and Family: Not on file  . Frequency of Social Gatherings with Friends and Family: Not on file  . Attends Religious Services: Not on file  . Active Member of Clubs or Organizations: Not on file  . Attends Archivist Meetings: Not on file  . Marital Status: Not on file  Intimate Partner Violence:   . Fear of Current or Ex-Partner: Not on file  . Emotionally Abused: Not on file  . Physically Abused: Not on file  . Sexually Abused: Not on file    Review of Systems: See HPI, otherwise negative ROS  Physical Exam: BP 140/85   Pulse 85   Temp (!) 97 F (36.1 C) (Oral)   Ht 5\' 8"  (1.727 m)   Wt 133 lb (60.3 kg)   BMI 20.22 kg/m  General:   Alert,   pleasant and cooperative in NAD Eyes:  Sclera clear, no icterus.  Pale Lungs:  Clear throughout to auscultation.   No wheezes, crackles, or rhonchi. No acute distress. Heart:  Regular rate and rhythm; no murmurs, clicks, rubs,  or gallops. Abdomen: Non-distended, normal bowel sounds.  Soft and nontender without appreciable mass or hepatosplenomegaly.  Pulses:  Normal pulses noted. Extremities:  Without clubbing or edema.  Impression/Plan: 86 year old gentleman with established iron deficiency  anemia, history of Hemoccult positive stool, distant history of sigmoid colon cancer.  History of complicated GERD. Clinically, he has some early satiety and weakness-fairly nonspecific.  No overt GI bleeding.  I discussed the standard approach of colonoscopy with a possible EGD to follow in this clinical setting..  Had a lengthy discussion about the risk benefits of invasive procedures given his advanced age.  He is in fairly good shape otherwise.  Patient and daughter are not sure if they would go to the extent of surgery should an occult lesion in his colon be found requiring such intervention.  However, they are concerned regarding the underlying cause of his anemia and bleeding..  They are interested in determining the etiology of his anemia and occult blood.  Further decision-making  would follow.  Recommendations: Therefore, will offer the patient a diagnostic colonoscopy possible EGD to follow utilizing conscious sedation in the near future.  The risks, benefits, limitations, imponderables and alternatives regarding both EGD and colonoscopy have been reviewed with the patient. Questions have been answered. All parties agreeable.    Notice: This dictation was prepared with Dragon dictation along with smaller phrase technology. Any transcriptional errors that result from this process are unintentional and may not be corrected upon review.

## 2019-12-21 NOTE — Patient Instructions (Signed)
Schedule a diagnostic TCS and possible EGD - Hx of Colon cancer, IDA and occult blood in stool- conscious sedation - wn next 2 weeks  Continue Omeprazole daily  Further recommendations to follow

## 2019-12-24 ENCOUNTER — Other Ambulatory Visit: Payer: Self-pay

## 2019-12-24 DIAGNOSIS — D509 Iron deficiency anemia, unspecified: Secondary | ICD-10-CM

## 2019-12-24 DIAGNOSIS — Z85038 Personal history of other malignant neoplasm of large intestine: Secondary | ICD-10-CM

## 2019-12-24 DIAGNOSIS — K921 Melena: Secondary | ICD-10-CM

## 2019-12-24 NOTE — Telephone Encounter (Signed)
Called daughter [name]), TCS-/+EGD w/RMR scheduled for 12/28/19 at 8:15am. COVID test 12/26/19 at 10:00am. She will come by office to pickup Suprep sample and instructions. Orders entered.

## 2019-12-24 NOTE — Telephone Encounter (Signed)
Schedule the patient for 1/26 with RMR

## 2019-12-24 NOTE — Telephone Encounter (Signed)
PA for TCS submitted via [employer] website. Case went to clinical review. [facility] Tracking# [id number]. Clinical notes faxed to [facility].  PA for EGD submitted via [employer] website. Case approved. [employer]# [id number], valid 12/28/19-01/27/20.

## 2019-12-24 NOTE — Telephone Encounter (Signed)
Routing to MB

## 2019-12-25 ENCOUNTER — Other Ambulatory Visit: Payer: Self-pay

## 2019-12-25 NOTE — Telephone Encounter (Signed)
TCS approved. [employer]# [id number], valid 12/28/19-01/27/20.

## 2019-12-26 ENCOUNTER — Other Ambulatory Visit (HOSPITAL_COMMUNITY)
Admission: RE | Admit: 2019-12-26 | Discharge: 2019-12-26 | Disposition: A | Discharge status: Home / Self Care | Payer: Medicare HMO | Source: Ambulatory Visit | Attending: Internal Medicine | Admitting: Internal Medicine

## 2019-12-26 ENCOUNTER — Other Ambulatory Visit: Payer: Self-pay

## 2019-12-26 DIAGNOSIS — Z20822 Contact with and (suspected) exposure to covid-19: Secondary | ICD-10-CM | POA: Insufficient documentation

## 2019-12-26 DIAGNOSIS — Z01812 Encounter for preprocedural laboratory examination: Secondary | ICD-10-CM | POA: Insufficient documentation

## 2019-12-26 LAB — SARS CORONAVIRUS 2 (TAT 6-24 HRS): SARS Coronavirus 2: NEGATIVE

## 2019-12-28 ENCOUNTER — Encounter (HOSPITAL_COMMUNITY)
Admission: RE | Disposition: A | Discharge status: Home / Self Care | Payer: Self-pay | Source: Home / Self Care | Attending: Emergency Medicine

## 2019-12-28 ENCOUNTER — Encounter (HOSPITAL_COMMUNITY): Payer: Self-pay | Admitting: Internal Medicine

## 2019-12-28 ENCOUNTER — Ambulatory Visit (HOSPITAL_COMMUNITY)
Admission: RE | Admit: 2019-12-28 | Discharge: 2019-12-28 | Disposition: A | Discharge status: Home / Self Care | Payer: Medicare HMO | Attending: Emergency Medicine | Admitting: Emergency Medicine

## 2019-12-28 ENCOUNTER — Other Ambulatory Visit: Payer: Self-pay

## 2019-12-28 ENCOUNTER — Ambulatory Visit (HOSPITAL_COMMUNITY): Admit: 2019-12-28 | Payer: Medicare HMO | Admitting: Internal Medicine

## 2019-12-28 DIAGNOSIS — Z980 Intestinal bypass and anastomosis status: Secondary | ICD-10-CM | POA: Insufficient documentation

## 2019-12-28 DIAGNOSIS — H9190 Unspecified hearing loss, unspecified ear: Secondary | ICD-10-CM | POA: Insufficient documentation

## 2019-12-28 DIAGNOSIS — R195 Other fecal abnormalities: Secondary | ICD-10-CM | POA: Diagnosis not present

## 2019-12-28 DIAGNOSIS — D128 Benign neoplasm of rectum: Secondary | ICD-10-CM | POA: Diagnosis not present

## 2019-12-28 DIAGNOSIS — D509 Iron deficiency anemia, unspecified: Secondary | ICD-10-CM

## 2019-12-28 DIAGNOSIS — K222 Esophageal obstruction: Secondary | ICD-10-CM | POA: Diagnosis not present

## 2019-12-28 DIAGNOSIS — K219 Gastro-esophageal reflux disease without esophagitis: Secondary | ICD-10-CM | POA: Insufficient documentation

## 2019-12-28 DIAGNOSIS — C180 Malignant neoplasm of cecum: Secondary | ICD-10-CM | POA: Diagnosis not present

## 2019-12-28 DIAGNOSIS — I4891 Unspecified atrial fibrillation: Secondary | ICD-10-CM | POA: Diagnosis not present

## 2019-12-28 DIAGNOSIS — K449 Diaphragmatic hernia without obstruction or gangrene: Secondary | ICD-10-CM | POA: Insufficient documentation

## 2019-12-28 DIAGNOSIS — K922 Gastrointestinal hemorrhage, unspecified: Secondary | ICD-10-CM | POA: Diagnosis not present

## 2019-12-28 DIAGNOSIS — Z85038 Personal history of other malignant neoplasm of large intestine: Secondary | ICD-10-CM | POA: Insufficient documentation

## 2019-12-28 DIAGNOSIS — I10 Essential (primary) hypertension: Secondary | ICD-10-CM | POA: Insufficient documentation

## 2019-12-28 DIAGNOSIS — Z79899 Other long term (current) drug therapy: Secondary | ICD-10-CM | POA: Insufficient documentation

## 2019-12-28 DIAGNOSIS — K921 Melena: Secondary | ICD-10-CM

## 2019-12-28 DIAGNOSIS — Z87891 Personal history of nicotine dependence: Secondary | ICD-10-CM | POA: Diagnosis not present

## 2019-12-28 DIAGNOSIS — N400 Enlarged prostate without lower urinary tract symptoms: Secondary | ICD-10-CM | POA: Insufficient documentation

## 2019-12-28 DIAGNOSIS — K319 Disease of stomach and duodenum, unspecified: Secondary | ICD-10-CM | POA: Insufficient documentation

## 2019-12-28 DIAGNOSIS — K3189 Other diseases of stomach and duodenum: Secondary | ICD-10-CM | POA: Diagnosis not present

## 2019-12-28 DIAGNOSIS — K621 Rectal polyp: Secondary | ICD-10-CM | POA: Diagnosis not present

## 2019-12-28 HISTORY — PX: ESOPHAGOGASTRODUODENOSCOPY: SHX5428

## 2019-12-28 HISTORY — PX: BIOPSY: SHX5522

## 2019-12-28 HISTORY — PX: COLONOSCOPY: SHX5424

## 2019-12-28 HISTORY — PX: POLYPECTOMY: SHX5525

## 2019-12-28 LAB — CBC WITH DIFFERENTIAL/PLATELET
Abs Immature Granulocytes: 0.02 10*3/uL (ref 0.00–0.07)
Basophils Absolute: 0 10*3/uL (ref 0.0–0.1)
Basophils Relative: 1 %
Eosinophils Absolute: 0 10*3/uL (ref 0.0–0.5)
Eosinophils Relative: 1 %
HCT: 33.5 % — ABNORMAL LOW (ref 39.0–52.0)
Hemoglobin: 9.9 g/dL — ABNORMAL LOW (ref 13.0–17.0)
Immature Granulocytes: 0 %
Lymphocytes Relative: 10 %
Lymphs Abs: 0.7 10*3/uL (ref 0.7–4.0)
MCH: 25.5 pg — ABNORMAL LOW (ref 26.0–34.0)
MCHC: 29.6 g/dL — ABNORMAL LOW (ref 30.0–36.0)
MCV: 86.3 fL (ref 80.0–100.0)
Monocytes Absolute: 0.5 10*3/uL (ref 0.1–1.0)
Monocytes Relative: 7 %
Neutro Abs: 5.7 10*3/uL (ref 1.7–7.7)
Neutrophils Relative %: 81 %
Platelets: 248 10*3/uL (ref 150–400)
RBC: 3.88 MIL/uL — ABNORMAL LOW (ref 4.22–5.81)
RDW: 14.7 % (ref 11.5–15.5)
WBC: 6.9 10*3/uL (ref 4.0–10.5)
nRBC: 0 % (ref 0.0–0.2)

## 2019-12-28 LAB — COMPREHENSIVE METABOLIC PANEL
ALT: 12 U/L (ref 0–44)
AST: 15 U/L (ref 15–41)
Albumin: 4.4 g/dL (ref 3.5–5.0)
Alkaline Phosphatase: 53 U/L (ref 38–126)
Anion gap: 11 (ref 5–15)
BUN: 18 mg/dL (ref 8–23)
CO2: 20 mmol/L — ABNORMAL LOW (ref 22–32)
Calcium: 9.4 mg/dL (ref 8.9–10.3)
Chloride: 107 mmol/L (ref 98–111)
Creatinine, Ser: 1.43 mg/dL — ABNORMAL HIGH (ref 0.61–1.24)
GFR calc Af Amer: 49 mL/min — ABNORMAL LOW (ref >60)
GFR calc non Af Amer: 43 mL/min — ABNORMAL LOW (ref >60)
Glucose, Bld: 92 mg/dL (ref 70–99)
Potassium: 4.7 mmol/L (ref 3.5–5.1)
Sodium: 138 mmol/L (ref 135–145)
Total Bilirubin: 0.9 mg/dL (ref 0.3–1.2)
Total Protein: 7.3 g/dL (ref 6.5–8.1)

## 2019-12-28 LAB — TROPONIN I (HIGH SENSITIVITY)
Troponin I (High Sensitivity): 20 ng/L — ABNORMAL HIGH (ref <18)
Troponin I (High Sensitivity): 21 ng/L — ABNORMAL HIGH (ref <18)

## 2019-12-28 SURGERY — COLONOSCOPY
Anesthesia: Moderate Sedation

## 2019-12-28 MED ORDER — SODIUM CHLORIDE 0.9 % IV SOLN: INTRAVENOUS | Status: DC

## 2019-12-28 MED ORDER — ONDANSETRON HCL 4 MG/2ML IJ SOLN
INTRAMUSCULAR | Status: DC | PRN
  Administered 2019-12-28: 4 mg via INTRAVENOUS

## 2019-12-28 MED ORDER — STERILE WATER FOR IRRIGATION IR SOLN
Status: DC | PRN
  Administered 2019-12-28: 13:00:00 1.5 mL

## 2019-12-28 MED ORDER — MEPERIDINE HCL 100 MG/ML IJ SOLN
INTRAMUSCULAR | Status: DC | PRN
  Administered 2019-12-28 (×2): 12.5 mg via INTRAVENOUS

## 2019-12-28 MED ORDER — LIDOCAINE VISCOUS HCL 2 % MT SOLN
OROMUCOSAL | Status: DC | PRN
  Administered 2019-12-28: 1 via OROMUCOSAL

## 2019-12-28 MED ORDER — LIDOCAINE VISCOUS HCL 2 % MT SOLN
OROMUCOSAL | Status: AC
  Filled 2019-12-28: qty 15

## 2019-12-28 MED ORDER — ONDANSETRON HCL 4 MG/2ML IJ SOLN
INTRAMUSCULAR | Status: AC
  Filled 2019-12-28: qty 2

## 2019-12-28 MED ORDER — MEPERIDINE HCL 50 MG/ML IJ SOLN
INTRAMUSCULAR | Status: AC
  Filled 2019-12-28: qty 1

## 2019-12-28 MED ORDER — MIDAZOLAM HCL 5 MG/5ML IJ SOLN
INTRAMUSCULAR | Status: AC
  Filled 2019-12-28: qty 10

## 2019-12-28 MED ORDER — SODIUM CHLORIDE 0.9 % IV SOLN
INTRAVENOUS | Status: DC
  Administered 2019-12-28: 1000 mL via INTRAVENOUS

## 2019-12-28 MED ORDER — MIDAZOLAM HCL 5 MG/5ML IJ SOLN
INTRAMUSCULAR | Status: DC | PRN
  Administered 2019-12-28 (×4): 1 mg via INTRAVENOUS

## 2019-12-28 NOTE — Interval H&P Note (Signed)
History and Physical Interval Note:  12/28/2019 12:49 PM  Tony Holt  has presented today for surgery, with the diagnosis of History of colon cancer, Iron deficiency anemia.  The various methods of treatment have been discussed with the patient and family. After consideration of risks, benefits and other options for treatment, the patient has consented to  Procedure(s): COLONOSCOPY (N/A) ESOPHAGOGASTRODUODENOSCOPY (EGD) (N/A) as a surgical intervention.  The patient's history has been reviewed, patient examined, no change in status, stable for surgery.  I have reviewed the patient's chart and labs.  Questions were answered to the patient's satisfaction.     [name]  Patient's procedures put off this morning due to atrial fibrillation-new diagnosis.  Seen in the ED by Dr. Stark Jock.  Card[facility]) consulted.  Laboratory evaluation done.  Patient has no symptoms.  His rate is controlled without specific therapy.  Was felt that no further intervention was needed at this time.  His potassium was normal creatinine slightly elevated minimal elevation in troponins. It was felt safe to go ahead and proceed with endoscopic evaluation today particularly in view of the fact the patient is already taken a colonoscopy preparation.  The risks, benefits, limitations, imponderables and alternatives regarding both EGD and colonoscopy have been reviewed with the patient. Questions have been answered. All parties agreeable.

## 2019-12-28 NOTE — Interval H&P Note (Signed)
History and Physical Interval Note:  12/28/2019 8:30 AM  Tony Holt  has presented today for surgery, with the diagnosis of history of colon cancer, iron deficiency anemia, occult blood in stool.  The various methods of treatment have been discussed with the patient and family. After consideration of risks, benefits and other options for treatment, the patient has consented to  Procedure(s) with comments: COLONOSCOPY (N/A) - 8:15am ESOPHAGOGASTRODUODENOSCOPY (EGD) (N/A) as a surgical intervention.  The patient's history has been reviewed, patient examined, no change in status, stable for surgery.  I have reviewed the patient's chart and labs.  Questions were answered to the patient's satisfaction.     [name]  Patient noted to be in atrial fibrillation with a controlled VR in the 70s this is a new diagnosis.  Patient has been "weak" lately.  No chest pain or dyspnea. It is best practice here to cancel the EGD and colonoscopy.  Have the patient go to the emergency room for initial evaluation.  We will circle back around with him in the office once atrial fibrillation has been evaluated and treated per cardiology. I discussed at length with [name], daughter at [phone]. I discussed with Gerald Stabs, RN in ED.

## 2019-12-28 NOTE — Discharge Instructions (Signed)
Colonoscopy Discharge Instructions  Read the instructions outlined below and refer to this sheet in the next few weeks. These discharge instructions provide you with general information on caring for yourself after you leave the hospital. Your doctor may also give you specific instructions. While your treatment has been planned according to the most current medical practices available, unavoidable complications occasionally occur. If you have any problems or questions after discharge, call Dr. Gala Romney at [phone]. ACTIVITY  You may resume your regular activity, but move at a slower pace for the next 24 hours.   Take frequent rest periods for the next 24 hours.   Walking will help get rid of the air and reduce the bloated feeling in your belly (abdomen).   No driving for 24 hours (because of the medicine (anesthesia) used during the test).    Do not sign any important legal documents or operate any machinery for 24 hours (because of the anesthesia used during the test).  NUTRITION  Drink plenty of fluids.   You may resume your normal diet as instructed by your doctor.   Begin with a light meal and progress to your normal diet. Heavy or fried foods are harder to digest and may make you feel sick to your stomach (nauseated).   Avoid alcoholic beverages for 24 hours or as instructed.  MEDICATIONS  You may resume your normal medications unless your doctor tells you otherwise.  WHAT YOU CAN EXPECT TODAY  Some feelings of bloating in the abdomen.   Passage of more gas than usual.   Spotting of blood in your stool or on the toilet paper.  IF YOU HAD POLYPS REMOVED DURING THE COLONOSCOPY:  No aspirin products for 7 days or as instructed.   No alcohol for 7 days or as instructed.   Eat a soft diet for the next 24 hours.  FINDING OUT THE RESULTS OF YOUR TEST Not all test results are available during your visit. If your test results are not back during the visit, make an appointment  with your caregiver to find out the results. Do not assume everything is normal if you have not heard from your caregiver or the medical facility. It is important for you to follow up on all of your test results.  SEEK IMMEDIATE MEDICAL ATTENTION IF:  You have more than a spotting of blood in your stool.   Your belly is swollen (abdominal distention).   You are nauseated or vomiting.   You have a temperature over 101.   You have abdominal pain or discomfort that is severe or gets worse throughout the day.    EGD Discharge instructions Please read the instructions outlined below and refer to this sheet in the next few weeks. These discharge instructions provide you with general information on caring for yourself after you leave the hospital. Your doctor may also give you specific instructions. While your treatment has been planned according to the most current medical practices available, unavoidable complications occasionally occur. If you have any problems or questions after discharge, please call your doctor. ACTIVITY  You may resume your regular activity but move at a slower pace for the next 24 hours.   Take frequent rest periods for the next 24 hours.   Walking will help expel (get rid of) the air and reduce the bloated feeling in your abdomen.   No driving for 24 hours (because of the anesthesia (medicine) used during the test).   You may shower.   Do not sign  any important legal documents or operate any machinery for 24 hours (because of the anesthesia used during the test).  NUTRITION  Drink plenty of fluids.   You may resume your normal diet.   Begin with a light meal and progress to your normal diet.   Avoid alcoholic beverages for 24 hours or as instructed by your caregiver.  MEDICATIONS  You may resume your normal medications unless your caregiver tells you otherwise.  WHAT YOU CAN EXPECT TODAY  You may experience abdominal discomfort such as a feeling of  fullness or "gas" pains.  FOLLOW-UP  Your doctor will discuss the results of your test with you.  SEEK IMMEDIATE MEDICAL ATTENTION IF ANY OF THE FOLLOWING OCCUR:  Excessive nausea (feeling sick to your stomach) and/or vomiting.   Severe abdominal pain and distention (swelling).   Trouble swallowing.   Temperature over 101 F (37.8 C).   Rectal bleeding or vomiting of blood.    A growth was found in your colon.  It was biopsied.  Also, a  polyp was removed.  Your stomach was inflamed.  Biopsies taken there as well  You need to see your primary care doctor and Dr. Harl Bowie the cardiologist regarding new diagnosis of atrial fibrillation  Further recommendations to follow pending review of pathology report  At patient request I spoke to [name], daughter at [phone] discussed findings.  I explained he likely has colon cancer.  I recommend that he see the cardiologist next week.

## 2019-12-28 NOTE — ED Triage Notes (Signed)
Dr Sterling Big sent patient from endo for new onset afib. Pt was having egd and colonoscopy for anemia and blood in stool.

## 2019-12-28 NOTE — ED Provider Notes (Signed)
[facility] EMERGENCY DEPARTMENT Provider Note   CSN: [id number] Arrival date & time: 12/28/19  [id number]     History Chief Complaint  Patient presents with  . Atrial Fibrillation    Tony Holt is a 86 y.o. male.  Patient is a 86 year old male with past medical history of anemia, colon cancer with prior bowel resection in 2000, hypertension, GERD.  He presents today for evaluation of irregular heartbeat.  Patient was scheduled to have a colonoscopy/endoscopy performed today to evaluate for a cause of his anemia.  During the preop assessment, he was found to be in atrial fibrillation and referred here as he has no history of this.  Patient denies to me he is experiencing any palpitations or chest pain and has no idea when this began.  He does describe some shortness of breath when he exerts himself.  He otherwise has no prior cardiac history.  The history is provided by the patient.  Atrial Fibrillation This is a new problem. Episode onset: Unknown. The problem occurs constantly. Pertinent negatives include no chest pain and no shortness of breath. Exacerbated by: Exertion. Nothing relieves the symptoms.       Past Medical History:  Diagnosis Date  . BPH (benign prostatic hyperplasia)   . Colon cancer ([place]) 1999  . Essential hypertension   . GERD (gastroesophageal reflux disease)   . Hard of hearing     Patient Active Problem List   Diagnosis Date Noted  . IDA (iron deficiency anemia) 05/17/2019  . Constipation 05/17/2019  . Bradycardia 05/28/2016  . Weakness 05/28/2016  . Acute renal failure ([place]) 05/28/2016  . Hyperkalemia 05/28/2016  . Nausea alone 03/24/2012  . Abdominal pain, epigastric 03/24/2012  . HTN (hypertension) 03/24/2012  . ANEMIA 09/16/2010  . HEMOCCULT POSITIVE STOOL 09/16/2010    Past Surgical History:  Procedure Laterality Date  . CATARACT EXTRACTION W/PHACO  01/18/2013   Procedure: CATARACT EXTRACTION PHACO AND INTRAOCULAR LENS PLACEMENT (IOC);   Surgeon: Tonny Branch, MD;  Location: [facility];  Service: Ophthalmology;  Laterality: Left;  CDE 17.11  . CATARACT EXTRACTION W/PHACO Right 02/05/2013   Procedure: CATARACT EXTRACTION PHACO AND INTRAOCULAR LENS PLACEMENT (IOC);  Surgeon: Tonny Branch, MD;  Location: [facility];  Service: Ophthalmology;  Laterality: Right;  CDE:19.49  . COLON SURGERY  1999   Sigmoid colon cancer, segmental resection by Drs. Smith/Bradford  . COLONOSCOPY  2002   Multiple adenomatous colon polyps  . ESOPHAGOGASTRODUODENOSCOPY  03/25/2012   Dr. Gala Romney: Severe ulcerative/erosive reflux esophagitis.  Small hiatal hernia.  Abnormal gastric and duodenal mucosa, biopsy showed reactive changes and minimal chronic inflammation but no H. pylori.  Marland Kitchen HERNIA REPAIR  02/2011   Right inguinal hernia repair with mesh, Dr. Geroge Baseman  . HERNIA REPAIR     three total       Family History  Problem Relation Age of Onset  . Heart attack Father        Deceased, age 70s  . Colon cancer Neg Hx   . Liver disease Neg Hx     Social History   Tobacco Use  . Smoking status: Former Smoker    Types: Cigarettes  . Smokeless tobacco: Current User    Types: Snuff  Substance Use Topics  . Alcohol use: No    Alcohol/week: 0.0 standard drinks    Comment: previous heavy drinker at age 64-30  . Drug use: No    Home Medications Prior to Admission medications   Medication Sig Start Date End Date Taking?  Authorizing Provider  diphenhydramine-acetaminophen (TYLENOL PM) 25-500 MG TABS tablet Take 2 tablets by mouth at bedtime as needed (sleep).   Yes [provider]  omeprazole (PRILOSEC) 20 MG capsule Take 20 mg by mouth 2 (two) times daily. 05/09/16  Yes [provider]  Pediatric Multiple Vitamins (FLINTSTONES MULTIVITAMIN PO) Take 1 tablet by mouth daily. Has iron in this   Yes [provider]  Tamsulosin HCl (FLOMAX) 0.4 MG CAPS Take 1 capsule (0.4 mg total) by mouth daily. 03/27/12  Yes Sinda Du, MD  amLODipine  (NORVASC) 5 MG tablet Take 1 tablet (5 mg total) by mouth daily. Patient not taking: Reported on 12/26/2019 05/30/16   Rosita Fire, MD  HYDROcodone-acetaminophen (NORCO/VICODIN) 5-325 MG tablet Take 1 tablet by mouth every 12 (twelve) hours as needed for moderate pain.  05/13/16   [provider]    Allergies    Patient has no known allergies.  Review of Systems   Review of Systems  Respiratory: Negative for shortness of breath.   Cardiovascular: Negative for chest pain.  All other systems reviewed and are negative.   Physical Exam Updated Vital Signs BP (!) 147/77   Pulse 86   Temp (!) 97.5 F (36.4 C) (Oral)   Resp 15   Ht 5\' 8"  (1.727 m)   Wt 57.6 kg   SpO2 99%   BMI 19.31 kg/m   Physical Exam Vitals and nursing note reviewed.  Constitutional:      General: He is not in acute distress.    Appearance: He is well-developed. He is not diaphoretic.  HENT:     Head: Normocephalic and atraumatic.  Cardiovascular:     Rate and Rhythm: Normal rate. Rhythm irregular.     Heart sounds: No murmur. No friction rub.  Pulmonary:     Effort: Pulmonary effort is normal. No respiratory distress.     Breath sounds: Normal breath sounds. No wheezing or rales.  Abdominal:     General: Bowel sounds are normal. There is no distension.     Palpations: Abdomen is soft.     Tenderness: There is no abdominal tenderness.  Musculoskeletal:        General: Normal range of motion.     Cervical back: Normal range of motion and neck supple.  Skin:    General: Skin is warm and dry.  Neurological:     General: No focal deficit present.     Mental Status: He is alert and oriented to person, place, and time.     Cranial Nerves: No cranial nerve deficit.     Motor: No weakness.     Coordination: Coordination normal.     ED Results / Procedures / Treatments   Labs (all labs ordered are listed, but only abnormal results are displayed) Labs Reviewed  COMPREHENSIVE METABOLIC PANEL    CBC WITH DIFFERENTIAL/PLATELET  TROPONIN I (HIGH SENSITIVITY)    EKG None  Radiology No results found.  Procedures Procedures (including critical care time)  Medications Ordered in ED Medications  midazolam (VERSED) 5 MG/5ML injection (has no administration in time range)  ondansetron (ZOFRAN) 4 MG/2ML injection (has no administration in time range)  meperidine (DEMEROL) 50 MG/ML injection (has no administration in time range)  lidocaine (XYLOCAINE) 2 % viscous mouth solution (has no administration in time range)    ED Course  I have reviewed the triage vital signs and the nursing notes.  Pertinent labs & imaging results that were available during my care of the  patient were reviewed by me and considered in my medical decision making (see chart for details).    MDM Rules/Calculators/A&P  Patient sent here from endoscopy for evaluation of an irregular heartbeat. Patient was to have endoscopy and colonoscopy this morning for chronic GI blood loss. He was found to be in atrial fibrillation. Patient is in atrial fibrillation, however with a controlled rate. His EKG shows no acute changes. He does have a troponin of 20, the significance of which I am uncertain. Patient is having no chest pain or difficulty breathing. Laboratory studies are otherwise unremarkable.  This was discussed with Dr. Harl Bowie from cardiology. He does not feel as though any emergent intervention is indicated. He feels as though follow-up in the clinic is all that is necessary.  I spoke with Dr. Gala Romney from GI to inform him of these findings. Since the patient has already gone through the bowel prep and has not drank since arriving to the ER, he feels as though he can perform the endoscopy/colonoscopy procedure that was planned. Patient will be discharged from the ER to the endoscopy suite.  Final Clinical Impression(s) / ED Diagnoses Final diagnoses:  Iron deficiency anemia, unspecified iron deficiency anemia  type  Blood in stool  History of colon cancer    Rx / DC Orders ED Discharge Orders    None       Veryl Speak, MD 12/28/19 1146

## 2019-12-31 NOTE — Op Note (Signed)
[facility] Patient Name: Tony Holt Procedure Date: 12/28/2019 12:34 PM MRN: [id number] Date of Birth: 1926-07-02 Attending MD: Norvel Richards , MD CSN: [id number] Age: 86 Admit Type: Outpatient Procedure:                Colonoscopy Indications:              Iron deficiency anemia Providers:                Norvel Richards, MD, Charlsie Quest. Theda Sers RN, RN,                            Aram Candela Referring MD:              Medicines:                Midazolam 4 mg IV, Meperidine 50 mg IV, Ondansetron                            4 mg SL Complications:            No immediate complications. Estimated Blood Loss:     Estimated blood loss was minimal. Procedure:                Pre-Anesthesia Assessment:                           - Prior to the procedure, a History and Physical                            was performed, and patient medications and                            allergies were reviewed. The patient's tolerance of                            previous anesthesia was also reviewed. The risks                            and benefits of the procedure and the sedation                            options and risks were discussed with the patient.                            All questions were answered, and informed consent                            was obtained. Prior Anticoagulants: The patient has                            taken no previous anticoagulant or antiplatelet                            agents. ASA Grade Assessment: III - A patient with  severe systemic disease. After reviewing the risks                            and benefits, the patient was deemed in                            satisfactory condition to undergo the procedure.                           After obtaining informed consent, the colonoscope                            was passed under direct vision. Throughout the                            procedure, the patient's blood  pressure, pulse, and                            oxygen saturations were monitored continuously. The                            [id number] scope was introduced through                            the anus and advanced to the the cecum, identified                            by appendiceal orifice and ileocecal valve. The                            colonoscopy was performed without difficulty. The                            patient tolerated the procedure well. The quality                            of the bowel preparation was adequate. The                            ileocecal valve, appendiceal orifice, and rectum                            were photographed. Scope In: 1:16:16 PM Scope Out: 1:35:24 PM Scope Withdrawal Time: 0 hours 6 minutes 49 seconds  Total Procedure Duration: 0 hours 19 minutes 8 seconds  Findings:      The perianal and digital rectal examinations were normal.      A 7 mm polyp was found in the rectum. The polyp was sessile. The polyp       was removed with a cold snare. Resection and retrieval were complete.       Estimated blood loss was minimal. Exophytic mass arising out of the       cecum and extending to the ileocecal valve. Please see photos. Highly       likely colonic neoplasm. Multiple biopsies taken. Patient  had multiple's       small or polyps in the ascending segment. They were not manipulated.       Anastomosis from prior colon cancer resection identified at 30 cm from       the anal verge. The remainder the colonic mucosa appeared normal. Impression:               - One 7 mm polyp in the rectum, removed with a cold                            snare. Resected and retrieved. Anastomosis 30 cm.                            Cecal mass suspicious for carcinoma?"biopsied;                            ascending colon polyps?"not manipulated. Moderate Sedation:      Moderate (conscious) sedation was administered by the endoscopy nurse       and supervised  by the endoscopist. The following parameters were       monitored: oxygen saturation, heart rate, blood pressure, respiratory       rate, EKG, adequacy of pulmonary ventilation, and response to care.       Total physician intraservice time was 40 minutes. Recommendation:           - Patient has a contact number available for                            emergencies. The signs and symptoms of potential                            delayed complications were discussed with the                            patient. Return to normal activities tomorrow.                            Written discharge instructions were provided to the                            patient.                           - Advance diet as tolerated.                           - Continue present medications.                           - No repeat colonoscopy. See EGD report. Patient                            unsure whether he would want to have surgery or not.                           - Return to GI clinic (date not yet determined).  Patient with new diagnosis of atrial fibrillation                            today. Seen in ED. Cardiology consulted                            (telephone). Patient needs to follow-up with PCP in                            the near future and have an in person cardiology                            consultation. We will orchestrate. Procedure Code(s):        --- Professional ---                           [phone], Colonoscopy, flexible; with removal of                            tumor(s), polyp(s), or other lesion(s) by snare                            technique                           99153, Moderate sedation; each additional 15                            minutes intraservice time                           99153, Moderate sedation; each additional 15                            minutes intraservice time                           G0500, Moderate sedation services provided by  the                            same physician or other qualified health care                            professional performing a gastrointestinal                            endoscopic service that sedation supports,                            requiring the presence of an independent trained                            observer to assist in the monitoring of the                            patient's  level of consciousness and physiological                            status; initial 15 minutes of intra-service time;                            patient age 71 years or older (additional time may                            be reported with [phone], as appropriate) Diagnosis Code(s):        --- Professional ---                           K62.1, Rectal polyp                           D50.9, Iron deficiency anemia, unspecified CPT copyright 2019 [employer]. All rights reserved. The codes documented in this report are preliminary and upon coder review may  be revised to meet current compliance requirements. Cristopher Estimable. Sanad Fearnow, MD Norvel Richards, MD 12/28/2019 1:57:55 PM This report has been signed electronically. Number of Addenda: 0

## 2019-12-31 NOTE — Op Note (Signed)
[facility] Patient Name: Tony Holt Procedure Date: 12/28/2019 12:35 PM MRN: [id number] Date of Birth: 29-Nov-1926 Attending MD: Tony Holt , MD CSN: [id number] Age: 86 Admit Type: Outpatient Procedure:                Upper GI endoscopy Indications:              Iron deficiency anemia Providers:                Tony Richards, MD, Tony Quest. Theda Sers RN, RN,                            Tony Holt Referring MD:              Medicines:                Midazolam 4 mg IV, Meperidine 25 mg IV Complications:            No immediate complications. Estimated Blood Loss:     Estimated blood loss was minimal. Procedure:                Pre-Anesthesia Assessment:                           - Prior to the procedure, a History and Physical                            was performed, and patient medications and                            allergies were reviewed. The patient's tolerance of                            previous anesthesia was also reviewed. The risks                            and benefits of the procedure and the sedation                            options and risks were discussed with the patient.                            All questions were answered, and informed consent                            was obtained. Prior Anticoagulants: The patient has                            taken no previous anticoagulant or antiplatelet                            agents. ASA Grade Assessment: III - A patient with                            severe systemic disease. After reviewing the risks  and benefits, the patient was deemed in                            satisfactory condition to undergo the procedure.                           After obtaining informed consent, the endoscope was                            passed under direct vision. Throughout the                            procedure, the patient's blood pressure, pulse, and    oxygen saturations were monitored continuously. The                            [id number]) scope was introduced through the                            mouth, and advanced to the second part of duodenum.                            The upper GI endoscopy was accomplished without                            difficulty. The patient tolerated the procedure                            well. Scope In: 1:05:28 PM Scope Out: 1:10:47 PM Total Procedure Duration: 0 hours 5 minutes 19 seconds  Findings:      A non-obstructing Schatzki ring was found at the gastroesophageal       junction. Focally pale appearing esophageal mucosa distally. Please see       photos. No infiltrating process observed. Biopsies taken.      A medium-sized hiatal hernia was present.      Multiple dispersed erosions without stigmata of recent bleeding were       found in the entire examined stomach. No ulcer or infiltrating process       seen. Biopsies taken.      The duodenal bulb and second portion of the duodenum were normal. Impression:               - Non-obstructing Schatzki ring. Abnormal distal                            esophagus of uncertain significance?"status post                            biopsy                           - Medium-sized hiatal hernia.                           - Erosive gastropathy without stigmata of recent  bleeding. That is post biopsy.                           - Normal duodenal bulb and second portion of the                            duodenum.                           - Moderate Sedation:      Moderate (conscious) sedation was administered by the endoscopy nurse       and supervised by the endoscopist. The following parameters were       monitored: oxygen saturation, heart rate, blood pressure, respiratory       rate, EKG, adequacy of pulmonary ventilation, and response to care.       Total physician intraservice time was 40 minutes. Recommendation:            - Patient has a contact number available for                            emergencies. The signs and symptoms of potential                            delayed complications were discussed with the                            patient. Return to normal activities tomorrow.                            Written discharge instructions were provided to the                            patient.                           - Advance diet as tolerated. Patient to follow-up                            with PCP and cardiology consultation for newly                            diagnosed atrial fibrillation today. Rate                            controlled. No symptoms. He was evaluated in ED                            telephone consultation with Dr. Harl Holt took place.                           See colonoscopy report Procedure Code(s):        --- Professional ---                           [phone], Esophagogastroduodenoscopy, flexible,  transoral; diagnostic, including collection of                            specimen(s) by brushing or washing, when performed                            (separate procedure)                           99153, Moderate sedation; each additional 15                            minutes intraservice time                           99153, Moderate sedation; each additional 15                            minutes intraservice time                           G0500, Moderate sedation services provided by the                            same physician or other qualified health care                            professional performing a gastrointestinal                            endoscopic service that sedation supports,                            requiring the presence of an independent trained                            observer to assist in the monitoring of the                            patient's level of consciousness and physiological                             status; initial 15 minutes of intra-service time;                            patient age 84 years or older (additional time may                            be reported with [phone], as appropriate) Diagnosis Code(s):        --- Professional ---                           K22.2, Esophageal obstruction                           K44.9, Diaphragmatic hernia without obstruction  or                            gangrene                           K92.2, Gastrointestinal hemorrhage, unspecified                           D50.9, Iron deficiency anemia, unspecified CPT copyright 2019 [employer]. All rights reserved. The codes documented in this report are preliminary and upon coder review may  be revised to meet current compliance requirements. Cristopher Estimable. Portia Wisdom, MD Tony Richards, MD 12/28/2019 1:51:35 PM This report has been signed electronically. Number of Addenda: 0

## 2020-01-01 ENCOUNTER — Telehealth: Payer: Self-pay

## 2020-01-01 ENCOUNTER — Other Ambulatory Visit: Payer: Self-pay

## 2020-01-01 NOTE — Telephone Encounter (Signed)
Received a call from [facility] (pathologist), pt's bx results are as followed.  Fecal mass, Adenocarcinoma.

## 2020-01-02 ENCOUNTER — Other Ambulatory Visit: Payer: Self-pay

## 2020-01-02 DIAGNOSIS — C189 Malignant neoplasm of colon, unspecified: Secondary | ICD-10-CM

## 2020-01-02 LAB — SURGICAL PATHOLOGY

## 2020-01-08 ENCOUNTER — Ambulatory Visit: Payer: Medicare HMO | Admitting: General Surgery

## 2020-01-08 ENCOUNTER — Encounter: Payer: Self-pay | Admitting: General Surgery

## 2020-01-08 ENCOUNTER — Other Ambulatory Visit: Payer: Self-pay

## 2020-01-08 VITALS — BP 129/72 | HR 91 | Temp 97.6°F | Resp 16 | Ht 68.0 in | Wt 132.0 lb

## 2020-01-08 DIAGNOSIS — C182 Malignant neoplasm of ascending colon: Secondary | ICD-10-CM

## 2020-01-08 MED ORDER — METRONIDAZOLE 500 MG PO TABS: 1000.0000 mg | ORAL_TABLET | ORAL | 0 refills | Status: DC

## 2020-01-08 MED ORDER — NEOMYCIN SULFATE 500 MG PO TABS: 1000.0000 mg | ORAL_TABLET | ORAL | 0 refills | Status: DC

## 2020-01-08 NOTE — Patient Instructions (Signed)
CT abdomen and pelvis and CT chest ordered for cancer staging. Someone will call you with the time.  Colon Preparation: Buy from the Store: Miralax bottle [id number]).  Gatorade 64 oz (not red). Dulcolax tablets.   The Day Prior to Surgery: Take 4 ducolax tablets at 7am with water. Drink plenty of clear liquids all day to avoid dehydration, no solid food.    Mix the bottle of Miralax and 64 oz of Gatorade and drink this mixture starting at 10am. Drink it gradually over the next few hours, 8 ounces every 15-30 minutes until it is gone. Finish this by 2pm.  Take 2 neomycin 500mg  tablets and 2 metronidazole 500mg  tablets at 2 pm. Take 2 neomycin 500mg  tablets and 2 metronidazole 500mg  tablets at 3pm. Take 2 neomycin 500mg  tablets and 2 metronidazole 500mg  tablets at 10pm.    Do not eat or drink anything after midnight the night before your surgery.  Do not eat or drink anything that morning, and take medications as instructed by the hospital staff on your preoperative visit.   Open Patial Colectomy An open colectomy is surgery to remove part or all of the large intestine (colon). This procedure may be used to treat several conditions, including:  Inflammation and infection of the colon (diverticulitis).  Tumors or masses in the colon.  Inflammatory bowel disease, such as Crohn disease or ulcerative colitis.  Bleeding from the colon.  Blockage or obstruction of the colon. Tell a health care provider about:  Any allergies you have.  All medicines you are taking, including vitamins, herbs, eye drops, creams, and over-the-counter medicines.  Any problems you or family members have had with anesthetic medicines.  Any blood disorders you have.  Any surgeries you have had.  Any medical conditions you have.  Whether you are pregnant or may be pregnant.  Whether you smoke or use tobacco products. These can affect your body's reaction to anesthesia. What are the risks? Generally,  this is a safe procedure. However, problems may occur, including:  Infection.  Bleeding.  Allergic reactions to medicines.  Damage to other structures or organs.  Pneumonia.  The incision opening up.  Tissues from inside the abdomen bulging through the incision (hernia).  Reopening of the colon where it was stitched or stapled together.  A blood clot forming in a vein and traveling to the lungs.  Future blockage of the small intestine from scar tissue. Bowel prep In some cases, you may be prescribed an oral bowel prep to clean out your colon. If so:  Take it as told by your health care provider. Starting the day before your procedure, you may need to drink a large amount of medicated liquid. The liquid will cause you to have multiple loose stools until your stool is almost clear or light green.  Follow instructions from your health care provider about eating and drinking restrictions during bowel prep. Medicines  Ask your health care provider about: ? Changing or stopping your regular medicines or vitamins. This is especially important if you are taking diabetes medicines, blood thinners, or vitamin E. ? Taking medicines such as aspirin and ibuprofen. These medicines can thin your blood. Do not take these medicines before your procedure if your health care provider instructs you not to.  If you were prescribed an antibiotic medicine, take it as told by your health care provider. General instructions  Bring loose-fitting, comfortable clothing and slip-on shoes that you can put on without bending over.  Make sure to see  your health care provider for any tests that you need before the procedure, such as: ? Blood tests. ? A test to check the heart's rhythm (electrocardiogram, ECG). ? A CT scan of your abdomen. ? Urine tests. ? Colonoscopy.  Plan to have someone take you home from the hospital or clinic.  Arrange for someone to help you with your activities during your  recovery. What happens during the procedure?  To reduce your risk of infection: ? Your health care team will wash or sanitize their hands. ? Your skin will be washed with soap. ? Hair may be removed from the surgical area.  An IV tube will be inserted into one of your veins. The tube will be used to give you medicines and fluids.  You will be given a medicine to make you fall asleep (general anesthetic). You may also be given a medicine to help you relax (sedative).  Small monitors will be connected to your body. They will be used to check your heart, blood pressure, and oxygen level.  A breathing tube may be placed into your lungs during the procedure.  A thin, flexible tube (catheter) will be placed into your bladder to drain urine.  A tube may be inserted through your nose and into your stomach (nasogastric tube, or NG tube). The tube is used to remove stomach fluids after surgery until the intestines start working again.  An incision will be made in your abdomen.  Clamps or staples will be put on your colon.  The part of the colon between the clamps or staples will be removed.  The ends of the colon that remain will be stitched or stapled together.  The incision in your abdomen will be closed with stitches (sutures) or staples.  The incision will be covered with a bandage (dressing).  A small opening (stoma) may be created in your lower abdomen. A removable, external pouch (ostomy pouch) will be attached to the stoma. This pouch will collect stool outside of your body. Stool passes through the stoma and into the pouch instead of through your anus. The procedure may vary among health care providers and hospitals. What happens after the procedure?  Your blood pressure, heart rate, breathing rate, and blood oxygen level will be monitored until the medicines you were given have worn off.  You may continue to receive fluids and medicines through an IV tube.  You will start on a  clear liquid diet and gradually go back to a normal diet.  Do not drive until your health care provider approves.  You may have some pain in your abdomen. You will be given pain medicine to control the pain.  You will be encouraged to do the following: ? Do breathing exercises to prevent pneumonia. ? Get up and start walking within a day after surgery. You should try to get up 5-6 times a day. This information is not intended to replace advice given to you by your health care provider. Make sure you discuss any questions you have with your health care provider. Document Revised: 05/25/2019 Document Reviewed: 08/30/2016 Elsevier Patient Education  2020 [employer].    Colorectal Cancer  Colorectal cancer is an abnormal growth of cells and tissue (tumor) in the colon or rectum, which are parts of the large intestine. The cancer can spread (metastasize) to other parts of the body. What are the causes? Most cases of colorectal cancer start as abnormal growths called polyps on the inner wall of the colon or rectum.  Other times, abnormal changes to genes (genetic mutations) can cause cells to form cancer. What increases the risk? You are more likely to develop this condition if:  You are older than age 19.  You have multiple polyps in the colon or rectum.  You have diabetes.  You are African American.  You have a family history of Lynch syndrome.  You have had cancer before.  You have certain hereditary conditions, such as: ? Familial adenomatous polyposis. ? Turcot syndrome. ? Peutz-Jeghers syndrome.  You eat a diet that is high in fat (especially animal fat) and low in fiber, fruits, and vegetables.  You have an inactive (sedentary) lifestyle.  You have an inflammatory bowel disease or Crohn's disease.  You smoke.  You drink alcohol excessively. What are the signs or symptoms? Early colorectal cancer often does not cause symptoms. As the cancer grows, symptoms may  include:  Changes in bowel habits.  Feeling like the bowel does not empty completely after a bowel movement.  Stools that are narrower than usual.  Blood in the stool.  Diarrhea.  Constipation.  Anemia.  Discomfort, pain, bloating, fullness, or cramps in the abdomen.  Frequent gas pain.  Unexplained weight loss.  Constant fatigue.  Nausea and vomiting. How is this diagnosed? This condition may be diagnosed with:  A medical history.  A physical exam.  Tests. These may include: ? A digital rectal exam. ? A stool test called a fecal occult blood test. ? Blood tests. ? A test in which a tissue sample is taken from the colon or rectum and examined under a microscope (biopsy). ? Imaging tests, such as:  X-rays.  A barium enema.  CT scans.  MRIs.  A sigmoidoscopy. This test is done to view the inside of the rectum.  A colonoscopy. This test is done to view the inside of the colon. During this test, small polyps can be removed or biopsies may be taken.  An endorectal ultrasound. This test checks how deep a tumor in the rectum has grown and whether the cancer has spread to lymph nodes or other nearby tissues. Your health care provider may order additional tests to find out whether the cancer has spread to other parts of the body (what stage it is). The stages of cancer include:  Stage 0. At this stage, the cancer is found only in the innermost lining of the colon or rectum.  Stage I. At this stage, the cancer has grown into the inner wall of the colon or rectum.  Stage II. At this stage, the cancer has gone more deeply into the wall of the colon or rectum or through the wall. It may have invaded nearby tissue.  Stage III. At this stage, the cancer has spread to nearby lymph nodes.  Stage IV. At this stage, the cancer has spread to other parts of the body, such as the liver or lungs. How is this treated? Treatment for this condition depends on the type and stage  of the cancer. Treatment may include:  Surgery. In the early stages of the cancer, surgery may be done to remove polyps or small tumors from the colon. In later stages, surgery may be done to remove part of the colon.  Chemotherapy. This treatment uses medicines to kill cancer cells.  Targeted therapy. This treatment targets specific gene mutations or proteins that the cancer expresses in order to kill tumor cells.  Radiation therapy. This treatment uses radiation to kill cancer cells or shrink tumors.  Radiofrequency ablation. This treatment uses radio waves to destroy the tumors that may have spread to other areas of the body, such as the liver. Follow these instructions at home:  Take over-the-counter and prescription medicines only as told by your health care provider.  Try to eat regular, healthy meals. Some of your treatments might affect your appetite. If you are having problems eating or with your appetite, ask to meet with a food and nutrition specialist (dietitian).  Consider joining a support group. This may help you learn about your diagnosis and cope with the stress of having colorectal cancer.  If you are admitted to the hospital, inform your cancer care team.  Keep all follow-up visits as told by your health care provider. This is important. How is this prevented?  Colorectal cancer can be prevented with screening tests that find polyps so they can be removed before they develop into cancer.  All adults should have screening for colorectal cancer starting at age 60 and continuing until age 23. Your health care provider may recommend screening at age 65. People at increased risk should start screening at an earlier age. Where to find more information  American Cancer Society: https://www.cancer.[place] https://www.cancer.gov Contact a health care provider if:  Your diarrhea or constipation does not go away.  You have blood in your  stool.  Your bowel habits change.  You have increased pain in your abdomen.  You notice new fatigue or weakness.  You lose weight. Get help right away if:  You have increased bleeding from your rectum.  You have any uncontrollable or severe abdomen (abdominal) symptoms. Summary  Colorectal cancer is an abnormal growth of cells and tissue (tumor) in the colon or rectum.  Common risk factors for this condition include having a relative with colon cancer, being older in age, having an inflammatory bowel disease, and being African American.  This condition may be diagnosed with tests, such as a colonoscopy and biopsy.  Treatment depends on the type and stage of the cancer. Commonly, treatment includes surgery to remove the tumor along with chemotherapy or targeted therapy.  Keep all follow-up visits as told by your health care provider. This is important. This information is not intended to replace advice given to you by your health care provider. Make sure you discuss any questions you have with your health care provider. Document Revised: 01/19/2018 Document Reviewed: 12/31/2016 Elsevier Patient Education  [place].

## 2020-01-08 NOTE — Progress Notes (Signed)
[facility] History and Physical  Reason for Referral: Cecal colon cancer  Referring Physician:  Dr. Gala Romney   Chief Complaint    Colon Cancer      Tony Holt is a 86 y.o. male.  HPI: Tony Holt is a very sweet 86 yo with a recent colonoscopy and EGD for anemia. He reported having dark stools but no red blood. He underwent endoscopy with Dr. Gala Romney and was found to have a cecal mass that was biospied and demonstrated adenocarcinoma. He had an  EGD that demonstrated a Schatzki's ring, erosive gastropathy, and the colonoscopy demonstrated a benign rectal polyp and the cecal mass. Prior to the endoscopy he had new onset A fib that was rate controlled and cardiology saw him. They recommended no additional workup and felt he could proceed with his endoscopies.  He has a prior history of sigmoid colon cancer s/p open colectomy with Dr. Romona Curls in 1999.    His daughter came with him to the office today. He live alone and carries out some activities of daily living including self care but he does not cook or clean or drive himself. His daughter has been staying with him more in the last 6 months as he has been more weak and they feel this is associated with the anemia.  He is not on a blood thinner. He sees Cardiology for follow up on 01/14/2020.  He denies any cardiac history or stroke history. He has had no weight loss.   Past Medical History:  Diagnosis Date   BPH (benign prostatic hyperplasia)    Colon cancer ([place]) 1999   Essential hypertension    GERD (gastroesophageal reflux disease)    Hard of hearing     Past Surgical History:  Procedure Laterality Date   BIOPSY  12/28/2019   Procedure: BIOPSY;  Surgeon: Daneil Dolin, MD;  Location: [facility];  Service: Endoscopy;;  gastric, esophagus,cecal   CATARACT EXTRACTION [facility]  01/18/2013   Procedure: CATARACT EXTRACTION PHACO AND INTRAOCULAR LENS PLACEMENT ([place]);  Surgeon: Tonny Branch, MD;  Location: [facility] ORS;   Service: Ophthalmology;  Laterality: Left;  CDE 17.11   CATARACT EXTRACTION W/PHACO Right 02/05/2013   Procedure: CATARACT EXTRACTION PHACO AND INTRAOCULAR LENS PLACEMENT ([facility]);  Surgeon: Tonny Branch, MD;  Location: [facility] OR[facility];  Service: Ophthalmology;  Laterality: Right;  CDE:19.49   COLON SURGERY  1999   Sigmoid colon cancer, segmental resection by Drs. Smith/Bradford   COLONOSCOPY  2002   Multiple adenomatous colon polyps   COLONOSCOPY N/A 12/28/2019   Procedure: COLONOSCOPY;  Surgeon: Daneil Dolin, MD;  Location: [facility];  Service: Endoscopy;  Laterality: N/A;   ESOPHAGOGASTRODUODENOSCOPY  03/25/2012   Dr. Gala Romney: Severe ulcerative/erosive reflux esophagitis.  Small hiatal hernia.  Abnormal gastric and duodenal mucosa, biopsy showed reactive changes and minimal chronic inflammation but no H. pylori.   ESOPHAGOGASTRODUODENOSCOPY N/A 12/28/2019   Procedure: ESOPHAGOGASTRODUODENOSCOPY (EGD);  Surgeon: Daneil Dolin, MD;  Location[facility];  Service: Endoscopy;  Laterality: N/A;   HERNIA REPAIR  02/2011   Right inguinal hernia repair with mesh, Dr. Geroge Baseman   HERNIA REPAIR     three total   POLYPECTOMY  12/28/2019   Procedure: POLYPECTOMY;  Surgeon: Daneil Dolin, MD;  Location: [facility];  Service: Endoscopy;;  rectal     Family History  Problem Relation Age of Onset   Heart attack Father        Deceased, age 63s   Colon cancer  Neg Hx    Liver disease Neg Hx     Social History   Tobacco Use   Smoking status: Former Smoker    Types: Cigarettes   Smokeless tobacco: Current User    Types: Snuff  Substance Use Topics   Alcohol use: No    Alcohol/week: 0.0 standard drinks    Comment: previous heavy drinker at age 56-30   Drug use: No    Medications: I have reviewed the patient's current medications. Allergies as of 01/08/2020   No Known Allergies     Medication List       Accurate as of January 08, 2020 11:49 AM. If you have any questions, ask  your nurse or doctor.        diphenhydramine-acetaminophen 25-500 MG Tabs tablet Commonly known as: TYLENOL PM Take 2 tablets by mouth at bedtime as needed (sleep).   FLINTSTONES MULTIVITAMIN PO Take 1 tablet by mouth daily. Has iron in this   HYDROcodone-acetaminophen 5-325 MG tablet Commonly known as: NORCO/VICODIN Take 1 tablet by mouth every 12 (twelve) hours as needed for moderate pain.   omeprazole 20 MG capsule Commonly known as: PRILOSEC Take 20 mg by mouth 2 (two) times daily.   tamsulosin 0.4 MG Caps capsule Commonly known as: FLOMAX Take 1 capsule (0.4 mg total) by mouth daily.        ROS:  A comprehensive review of systems was negative except for: Constitutional: positive for fatigue Genitourinary: positive for frequency Musculoskeletal: positive for back pain GI- dark stools   Blood pressure 129/72, pulse 91, temperature 97.6 F (36.4 C), temperature source Oral, resp. rate 16, height 5\' 8"  (1.727 m), weight 132 lb (59.9 kg), SpO2 98 %. Physical Exam Vitals reviewed.  Constitutional:      General: He is not in acute distress.    Appearance: He is underweight.  HENT:     Head: Normocephalic and atraumatic.     Nose: Nose normal.     Mouth/Throat:     Mouth: Mucous membranes are moist.  Eyes:     Extraocular Movements: Extraocular movements intact.     Pupils: Pupils are equal, round, and reactive to light.  Cardiovascular:     Rate and Rhythm: Normal rate.  Pulmonary:     Effort: Pulmonary effort is normal.     Breath sounds: Normal breath sounds.  Abdominal:     General: There is no distension.     Palpations: Abdomen is soft.     Tenderness: There is no abdominal tenderness.     Comments: Prior midline with no hernia  Musculoskeletal:        General: No swelling. Normal range of motion.     Cervical back: Normal range of motion. No rigidity.  Skin:    General: Skin is warm and dry.  Neurological:     General: No focal deficit present.      Mental Status: He is alert and oriented to person, place, and time.  Psychiatric:        Mood and Affect: Mood normal.        Behavior: Behavior normal.     Results: Pathology: FINAL MICROSCOPIC DIAGNOSIS:   A. STOMACH, BIOPSY:  - Reactive gastropathy with features consistent with proton pump  inhibitor effect  - No H. pylori or intestinal metaplasia identified  - See comment    B. ESOPHAGUS, BIOPSY:  - Minute fragments of benign squamous and glandular epithelium  - No intestinal metaplasia or malignancy identified  C. COLON, CECAL, MASS, BIOPSY:  - Adenocarcinoma  - See comment   D. RECTUM, POLYPECTOMY:  - Tubular adenoma (2 of 2 fragments)  - No high grade dysplasia or malignancy identified  Assessment & Plan:  Tony Holt is a 86 y.o. male with a newly diagnosed cecal cancer. He has had a prior sigmoid cancer in 1999. I am unsure if he received any chemotherapy at that time. He has been weak but not really having any weight loss. He otherwise has no cardiac issues except for this new onset A Fib.  We discussed the colon cancer and the progression to worsening anemia and obstruction as the cancer grows. The patient still lives independently and wants to try to avoid getting an obstruction and suffering from further symptoms from the anemia.  The patient and daughter want to proceed with surgery. We discussed that we do laparoscopic surgeries for some people, but given this age and the prior surgery and in an effort to reduce anesthesia time that an open partial colectomy would be the best option.  We discussed that he would do a bowel preparation before surgery. We discussed that we would want cardiology to risk stratify him prior to the surgery. We discussed the risk of surgery including bleeding, infection, injury to other organs, anastomotic leak. We discussed that he may ultimately need rehab/ SNF if he gets weaker after surgery and that he is at risk for delirium  in the hospital due to this age. We discussed likely a 5 day or so course in the hospital, and that we would refer him to Oncology following the surgery but that he did not have to pursue chemotherapy if offered.  Since he has had a prior sigmoid colon cancer, I think we should get preoperative CT chest /abd/pelvis to stage him.    -CT chest, abdomen/pelvis for cancer staging  -Cardiology to see 2/1, will ask Dr. Harl Bowie to weigh in on risk stratification  -Patient to drink lots of boost/ ensure prior to surgery to help with protein calories  Colon Preparation:  Buy from the Store: Miralax bottle (288g).  Gatorade 64 oz (not red). Dulcolax tablets.   The Day Prior to Surgery: Take 4 ducolax tablets at 7am with water. Drink plenty of clear liquids all day to avoid dehydration, no solid food.    Mix the bottle of Miralax and 64 oz of Gatorade and drink this mixture starting at 10am. Drink it gradually over the next few hours, 8 ounces every 15-30 minutes until it is gone. Finish this by 2pm.  Take 2 neomycin 500mg  tablets and 2 metronidazole 500mg  tablets at 2 pm. Take 2 neomycin 500mg  tablets and 2 metronidazole 500mg  tablets at 3pm. Take 2 neomycin 500mg  tablets and 2 metronidazole 500mg  tablets at 10pm.    Do not eat or drink anything after midnight the night before your surgery.  Do not eat or drink anything that morning, and take medications as instructed by the hospital staff on your preoperative visit.    All questions were answered to the satisfaction of the patient and family.     Virl Cagey 01/08/2020, 11:49 AM

## 2020-01-14 ENCOUNTER — Ambulatory Visit: Payer: Medicare HMO | Admitting: Cardiology

## 2020-01-14 ENCOUNTER — Encounter: Payer: Self-pay | Admitting: Cardiology

## 2020-01-14 VITALS — BP 128/71 | HR 78 | Temp 98.4°F | Ht 68.0 in | Wt 132.0 lb

## 2020-01-14 DIAGNOSIS — I4891 Unspecified atrial fibrillation: Secondary | ICD-10-CM

## 2020-01-14 DIAGNOSIS — Z01810 Encounter for preprocedural cardiovascular examination: Secondary | ICD-10-CM | POA: Diagnosis not present

## 2020-01-14 NOTE — Progress Notes (Signed)
Clinical Summary Tony Holt is a 86 y.o.male seen today as a new patient.    1. Afib - new diagnosis, noted Jan 2021 when patient presented for endoscopy. Rate controlled afib at the time asymptomatic. - not started on anticoag due to iron deficiency anemia  - no palpitations     2. Bradycardia - seen in 2017 by Dr Domenic Polite, beta blocker stopped at that time. Had been on lopressor 50mg  bid.  -   3. Anemia - Jan 2021 EGD erosive gastopathy - Jan 2021 colonoscopy: cecal mass   4. Preoperative evaluation - sedentary x 49months due to fatigue - DOE walking room to room at home, started about 6 months Past Medical History:  Diagnosis Date  . BPH (benign prostatic hyperplasia)   . Colon cancer ([place]) 1999  . Essential hypertension   . GERD (gastroesophageal reflux disease)   . Hard of hearing      No Known Allergies   Current Outpatient Medications  Medication Sig Dispense Refill  . diphenhydramine-acetaminophen (TYLENOL PM) 25-500 MG TABS tablet Take 2 tablets by mouth at bedtime as needed (sleep).    Marland Kitchen HYDROcodone-acetaminophen (NORCO/VICODIN) 5-325 MG tablet Take 1 tablet by mouth every 12 (twelve) hours as needed for moderate pain.   0  . metroNIDAZOLE (FLAGYL) 500 MG tablet Take 2 tablets (1,000 mg total) by mouth as directed. Take 2 flagyl 500mg  tablets 2 pm, 3pm and 10pm. 6 tablet 0  . neomycin (MYCIFRADIN) 500 MG tablet Take 2 tablets (1,000 mg total) by mouth as directed. Take 2 neomycin 500mg  tablets 2 pm, 3pm and 10pm. 6 tablet 0  . omeprazole (PRILOSEC) 20 MG capsule Take 20 mg by mouth 2 (two) times daily.  11  . Pediatric Multiple Vitamins (FLINTSTONES MULTIVITAMIN PO) Take 1 tablet by mouth daily. Has iron in this    . Tamsulosin HCl (FLOMAX) 0.4 MG CAPS Take 1 capsule (0.4 mg total) by mouth daily. 30 capsule 12   No current facility-administered medications for this visit.     Past Surgical History:  Procedure Laterality Date  . BIOPSY  12/28/2019     Procedure: BIOPSY;  Surgeon: Daneil Dolin, MD;  [facility];  Service: Endoscopy;;  gastric, esophagus,cecal  . CATARACT EXTRACTION W/PHACO  01/18/2013   Procedure: CATARACT EXTRACTION PHACO AND INTRAOCULAR LENS PLACEMENT ([place]);  Surgeon: Tonny Jensine Luz, MD;  Location: [facility];  Service: Ophthalmology;  Laterality: Left;  CDE 17.11  . CATARACT EXTRACTION W/PHACO Right 02/05/2013   Procedure: CATARACT EXTRACTION PHACO AND INTRAOCULAR LENS PLACEMENT ([facility]);  Surgeon: Tonny Leidy Massar, MD;  Location: [facility];  Service: Ophthalmology;  Laterality: Right;  CDE:19.49  . COLON SURGERY  1999   Sigmoid colon cancer, segmental resection by Drs. Smith/Bradford  . COLONOSCOPY  2002   Multiple adenomatous colon polyps  . COLONOSCOPY N/A 12/28/2019   Procedure: COLONOSCOPY;  Surgeon: Daneil Dolin, MD;  [facility];  Service: Endoscopy;  Laterality: N/A;  . ESOPHAGOGASTRODUODENOSCOPY  03/25/2012   Dr. Gala Romney: Severe ulcerative/erosive reflux esophagitis.  Small hiatal hernia.  Abnormal gastric and duodenal mucosa, biopsy showed reactive changes and minimal chronic inflammation but no H. pylori.  . ESOPHAGOGASTRODUODENOSCOPY N/A 12/28/2019   Procedure: ESOPHAGOGASTRODUODENOSCOPY (EGD);  Surgeon: Daneil Dolin, MD;  [facility];  Service: Endoscopy;  Laterality: N/A;  . HERNIA REPAIR  02/2011   Right inguinal hernia repair with mesh, Dr. Geroge Baseman  . HERNIA REPAIR     three total  .  POLYPECTOMY  12/28/2019   Procedure: POLYPECTOMY;  Surgeon: Daneil Dolin, MD;  [facility];  Service: Endoscopy;;  rectal      No Known Allergies    Family History  Problem Relation Age of Onset  . Heart attack Father        Deceased, age 56s  . Colon cancer Neg Hx   . Liver disease Neg Hx      Social History Mr. Watwood reports that he has quit smoking. His smoking use included cigarettes. His smokeless tobacco use includes snuff. Mr. Levandowski reports no history of alcohol  use.   Review of Systems CONSTITUTIONAL: No weight loss, fever, chills, weakness or fatigue.  HEENT: Eyes: No visual loss, blurred vision, double vision or yellow sclerae.No hearing loss, sneezing, congestion, runny nose or sore throat.  SKIN: No rash or itching.  CARDIOVASCULAR: per hpi RESPIRATORY: per hpi GASTROINTESTINAL: No anorexia, nausea, vomiting or diarrhea. No abdominal pain or blood.  GENITOURINARY: No burning on urination, no polyuria NEUROLOGICAL: No headache, dizziness, syncope, paralysis, ataxia, numbness or tingling in the extremities. No change in bowel or bladder control.  MUSCULOSKELETAL: No muscle, back pain, joint pain or stiffness.  LYMPHATICS: No enlarged nodes. No history of splenectomy.  PSYCHIATRIC: No history of depression or anxiety.  ENDOCRINOLOGIC: No reports of sweating, cold or heat intolerance. No polyuria or polydipsia.  Marland Kitchen   Physical Examination Today's Vitals   01/14/20 1046  BP: 128/71  Pulse: 78  Temp: 98.4 F (36.9 C)  SpO2: 100%  Weight: 132 lb (59.9 kg)  Height: 5\' 8"  (1.727 m)   Body mass index is 20.07 kg/m.  Gen: resting comfortably, no acute distress HEENT: no scleral icterus, pupils equal round and reactive, no palptable cervical adenopathy,  CV: irreg, no m/r/g, no jvd Resp: Clear to auscultation bilaterally GI: abdomen is soft, non-tender, non-distended, normal bowel sounds, no hepatosplenomegaly MSK: extremities are warm, no edema.  Skin: warm, no rash Neuro:  no focal deficits Psych: appropriate affect     Assessment and Plan  1. Afib - new diagnosis recently - asymptomatic. Has not required av nodal agents. Beta blockers previously causes bradycardia.  - no anticoag given cecal mass and iron deficient anemia - obtain echo  2. Preoperative evaluation - DOE with low levels of activity. Not able to tolerate >4METs - we will f/u echo, plan for lexiscan likely after echo  Final surgical clearance pending testing  results    Arnoldo Lenis, M.D

## 2020-01-14 NOTE — Patient Instructions (Signed)
Medication Instructions:  Your physician recommends that you continue on your current medications as directed. Please refer to the Current Medication list given to you today.   Labwork: none  Testing/Procedures: Your physician has requested that you have an echocardiogram. Echocardiography is a painless test that uses sound waves to create images of your heart. It provides your doctor with information about the size and shape of your heart and how well your heart's chambers and valves are working. This procedure takes approximately one hour. There are no restrictions for this procedure.    Follow-Up: Your physician recommends that you schedule a follow-up appointment in: pending test results    Any Other Special Instructions Will Be Listed Below (If Applicable).     If you need a refill on your cardiac medications before your next appointment, please call your pharmacy.   

## 2020-01-15 ENCOUNTER — Other Ambulatory Visit: Payer: Self-pay

## 2020-01-15 ENCOUNTER — Ambulatory Visit (HOSPITAL_COMMUNITY)
Admission: RE | Admit: 2020-01-15 | Discharge: 2020-01-15 | Disposition: A | Discharge status: Home / Self Care | Payer: Medicare HMO | Source: Ambulatory Visit | Attending: Cardiology | Admitting: Cardiology

## 2020-01-15 DIAGNOSIS — I4891 Unspecified atrial fibrillation: Secondary | ICD-10-CM

## 2020-01-15 NOTE — Progress Notes (Signed)
*  PRELIMINARY RESULTS* Echocardiogram 2D Echocardiogram has been performed.  Tony Holt 01/15/2020, 1:55 PM

## 2020-01-17 ENCOUNTER — Other Ambulatory Visit: Payer: Self-pay

## 2020-01-17 ENCOUNTER — Ambulatory Visit (HOSPITAL_COMMUNITY)
Admission: RE | Admit: 2020-01-17 | Discharge: 2020-01-17 | Disposition: A | Discharge status: Home / Self Care | Payer: Medicare HMO | Source: Ambulatory Visit | Attending: General Surgery | Admitting: General Surgery

## 2020-01-17 DIAGNOSIS — C182 Malignant neoplasm of ascending colon: Secondary | ICD-10-CM | POA: Diagnosis not present

## 2020-01-17 MED ORDER — IOHEXOL 300 MG/ML  SOLN
80.0000 mL | Freq: Once | INTRAMUSCULAR | Status: AC | PRN
  Administered 2020-01-17: 80 mL via INTRAVENOUS

## 2020-01-17 NOTE — Patient Instructions (Signed)
Tony Holt  01/17/2020     @PREFPERIOPPHARMACY @   Your procedure is scheduled on  01/23/2020 .  Report to [name] at  [name]  Call this number if you have problems the morning of surgery:  [phone]   Remember:  Do not eat or drink after midnight.                       Take these medicines the morning of surgery with A SIP OF WATER  Hydrocodone(if needed), prilosec, flomax.    Do not wear jewelry, make-up or nail polish.  Do not wear lotions, powders, or perfumes, or deodorant. Please wear deodorant and brush your teeth.  Do not shave 48 hours prior to surgery.  Men may shave face and neck.  Do not bring valuables to the hospital.  [name] is not responsible for any belongings or valuables.  Contacts, dentures or bridgework may not be worn into surgery.  Leave your suitcase in the car.  After surgery it may be brought to your room.  For patients admitted to the hospital, discharge time will be determined by your treatment team.  Patients discharged the day of surgery will not be allowed to drive home.   Name and phone number of your driver:   family Special instructions:  None  Please read over the following fact sheets that you were given. Anesthesia Post-op Instructions and Care and Recovery After Surgery       Open Colectomy, Care After This sheet gives you information about how to care for yourself after your procedure. Your health care provider may also give you more specific instructions. If you have problems or questions, contact your health care provider. What can I expect after the procedure? After the procedure, it is common to have:  Pain in your abdomen, especially along your incision.  Tiredness. Your energy level will return to normal over the next several weeks.  Constipation.  Nausea.  Difficulty urinating. Follow these instructions at home: Activity  You may be able to return to most of your normal activities  within 1-2 weeks, such as working, walking up stairs, and sexual activity.  Avoid activities that require a lot of energy for 4-6 weeks after surgery, such as running, climbing, and lifting heavy objects. Ask your health care provider what activities are safe for you.  Take rest breaks during the day as needed.  Do not drive for 1-2 weeks or until your health care provider says that it is safe.  Do not drive or use heavy machinery while taking prescription pain medicines.  Do not lift anything that is heavier than 10 lb (4.3 kg) until your health care provider says that it is safe. Incision care   Follow instructions from your health care provider about how to take care of your incision. Make sure you: ? Wash your hands with soap and water before you change your bandage (dressing). If soap and water are not available, use hand sanitizer. ? Change your dressing as told by your health care provider. ? Leave stitches (sutures) or staples in place. These skin closures may need to stay in place for 2 weeks or longer.  Avoid wearing tight clothing around your incision.  Protect your incision area from the sun.  Check your incision area every day for signs of infection. Check for: ? More redness, swelling, or pain. ? More fluid or blood. ?  Warmth. ? Pus or a bad smell. General instructions  Do not take baths, swim, or use a hot tub until your health care provider approves. Ask your health care provider when you may shower.  Take over-the-counter and prescription medicines, including stool softeners, only as told by your health care provider.  Eat a low-fat and low-fiber diet for the first 4 weeks after surgery.  Keep all follow-up visits as told by your health care provider. This is important. Contact a health care provider if:  You have more redness, swelling, or pain around your incision.  You have more fluid or blood coming from your incision.  Your incision feels warm to the  touch.  You have pus or a bad smell coming from your incision.  You have a fever or chills.  You do not have a bowel movement 2-3 days after surgery.  You cannot eat or drink for 24 hours or more.  You have persistent nausea and vomiting.  You have abdominal pain that gets worse and does not get better with medicine. Get help right away if:  You have chest pain.  You have shortness of breath.  You have pain or swelling in your legs.  Your incision breaks open after your sutures or staples have been removed.  You have bleeding from the rectum. This information is not intended to replace advice given to you by your health care provider. Make sure you discuss any questions you have with your health care provider. Document Revised: 11/11/2017 Document Reviewed: 08/30/2016 Elsevier Patient Education  2020 [place] Anesthesia, Adult, Care After This sheet gives you information about how to care for yourself after your procedure. Your health care provider may also give you more specific instructions. If you have problems or questions, contact your health care provider. What can I expect after the procedure? After the procedure, the following side effects are common:  Pain or discomfort at the IV site.  Nausea.  Vomiting.  Sore throat.  Trouble concentrating.  Feeling cold or chills.  Weak or tired.  Sleepiness and fatigue.  Soreness and body aches. These side effects can affect parts of the body that were not involved in surgery. Follow these instructions at home:  For at least 24 hours after the procedure:  Have a responsible adult stay with you. It is important to have someone help care for you until you are awake and alert.  Rest as needed.  Do not: ? Participate in activities in which you could fall or become injured. ? Drive. ? Use heavy machinery. ? Drink alcohol. ? Take sleeping pills or medicines that cause drowsiness. ? Make important  decisions or sign legal documents. ? Take care of children on your own. Eating and drinking  Follow any instructions from your health care provider about eating or drinking restrictions.  When you feel hungry, start by eating small amounts of foods that are soft and easy to digest (bland), such as toast. Gradually return to your regular diet.  Drink enough fluid to keep your urine pale yellow.  If you vomit, rehydrate by drinking water, juice, or clear broth. General instructions  If you have sleep apnea, surgery and certain medicines can increase your risk for breathing problems. Follow instructions from your health care provider about wearing your sleep device: ? Anytime you are sleeping, including during daytime naps. ? While taking prescription pain medicines, sleeping medicines, or medicines that make you drowsy.  Return to your normal activities as told  by your health care provider. Ask your health care provider what activities are safe for you.  Take over-the-counter and prescription medicines only as told by your health care provider.  If you smoke, do not smoke without supervision.  Keep all follow-up visits as told by your health care provider. This is important. Contact a health care provider if:  You have nausea or vomiting that does not get better with medicine.  You cannot eat or drink without vomiting.  You have pain that does not get better with medicine.  You are unable to pass urine.  You develop a skin rash.  You have a fever.  You have redness around your IV site that gets worse. Get help right away if:  You have difficulty breathing.  You have chest pain.  You have blood in your urine or stool, or you vomit blood. Summary  After the procedure, it is common to have a sore throat or nausea. It is also common to feel tired.  Have a responsible adult stay with you for the first 24 hours after general anesthesia. It is important to have someone help  care for you until you are awake and alert.  When you feel hungry, start by eating small amounts of foods that are soft and easy to digest (bland), such as toast. Gradually return to your regular diet.  Drink enough fluid to keep your urine pale yellow.  Return to your normal activities as told by your health care provider. Ask your health care provider what activities are safe for you. This information is not intended to replace advice given to you by your health care provider. Make sure you discuss any questions you have with your health care provider. Document Revised: 12/02/2017 Document Reviewed: 07/15/2017 Elsevier Patient Education  [place]. How to Use Chlorhexidine for Bathing Chlorhexidine gluconate (CHG) is a germ-killing (antiseptic) solution that is used to clean the skin. It can get rid of the bacteria that normally live on the skin and can keep them away for about 24 hours. To clean your skin with CHG, you may be given:  A CHG solution to use in the shower or as part of a sponge bath.  A prepackaged cloth that contains CHG. Cleaning your skin with CHG may help lower the risk for infection:  While you are staying in the intensive care unit of the hospital.  If you have a vascular access, such as a central line, to provide short-term or long-term access to your veins.  If you have a catheter to drain urine from your bladder.  If you are on a ventilator. A ventilator is a machine that helps you breathe by moving air in and out of your lungs.  After surgery. What are the risks? Risks of using CHG include:  A skin reaction.  Hearing loss, if CHG gets in your ears.  Eye injury, if CHG gets in your eyes and is not rinsed out.  The CHG product catching fire. Make sure that you avoid smoking and flames after applying CHG to your skin. Do not use CHG:  If you have a chlorhexidine allergy or have previously reacted to chlorhexidine.  On babies younger than 75  months of age. How to use CHG solution  Use CHG only as told by your health care provider, and follow the instructions on the label.  Use the full amount of CHG as directed. Usually, this is one bottle. During a shower Follow these steps when using CHG  solution during a shower (unless your health care provider gives you different instructions): 1. Start the shower. 2. Use your normal soap and shampoo to wash your face and hair. 3. Turn off the shower or move out of the shower stream. 4. Pour the CHG onto a clean washcloth. Do not use any type of brush or rough-edged sponge. 5. Starting at your neck, lather your body down to your toes. Make sure you follow these instructions: ? If you will be having surgery, pay special attention to the part of your body where you will be having surgery. Scrub this area for at least 1 minute. ? Do not use CHG on your head or face. If the solution gets into your ears or eyes, rinse them well with water. ? Avoid your genital area. ? Avoid any areas of skin that have broken skin, cuts, or scrapes. ? Scrub your back and under your arms. Make sure to wash skin folds. 6. Let the lather sit on your skin for 1-2 minutes or as long as told by your health care provider. 7. Thoroughly rinse your entire body in the shower. Make sure that all body creases and crevices are rinsed well. 8. Dry off with a clean towel. Do not put any substances on your body afterward--such as powder, lotion, or perfume--unless you are told to do so by your health care provider. Only use lotions that are recommended by the manufacturer. 9. Put on clean clothes or pajamas. 10. If it is the night before your surgery, sleep in clean sheets.  During a sponge bath Follow these steps when using CHG solution during a sponge bath (unless your health care provider gives you different instructions): 1. Use your normal soap and shampoo to wash your face and hair. 2. Pour the CHG onto a clean  washcloth. 3. Starting at your neck, lather your body down to your toes. Make sure you follow these instructions: ? If you will be having surgery, pay special attention to the part of your body where you will be having surgery. Scrub this area for at least 1 minute. ? Do not use CHG on your head or face. If the solution gets into your ears or eyes, rinse them well with water. ? Avoid your genital area. ? Avoid any areas of skin that have broken skin, cuts, or scrapes. ? Scrub your back and under your arms. Make sure to wash skin folds. 4. Let the lather sit on your skin for 1-2 minutes or as long as told by your health care provider. 5. Using a different clean, wet washcloth, thoroughly rinse your entire body. Make sure that all body creases and crevices are rinsed well. 6. Dry off with a clean towel. Do not put any substances on your body afterward--such as powder, lotion, or perfume--unless you are told to do so by your health care provider. Only use lotions that are recommended by the manufacturer. 7. Put on clean clothes or pajamas. 8. If it is the night before your surgery, sleep in clean sheets. How to use CHG prepackaged cloths  Only use CHG cloths as told by your health care provider, and follow the instructions on the label.  Use the CHG cloth on clean, dry skin.  Do not use the CHG cloth on your head or face unless your health care provider tells you to.  When washing with the CHG cloth: ? Avoid your genital area. ? Avoid any areas of skin that have broken skin, cuts, or  scrapes. Before surgery Follow these steps when using a CHG cloth to clean before surgery (unless your health care provider gives you different instructions): 1. Using the CHG cloth, vigorously scrub the part of your body where you will be having surgery. Scrub using a back-and-forth motion for 3 minutes. The area on your body should be completely wet with CHG when you are done scrubbing. 2. Do not rinse. Discard  the cloth and let the area air-dry. Do not put any substances on the area afterward, such as powder, lotion, or perfume. 3. Put on clean clothes or pajamas. 4. If it is the night before your surgery, sleep in clean sheets.  For general bathing Follow these steps when using CHG cloths for general bathing (unless your health care provider gives you different instructions). 1. Use a separate CHG cloth for each area of your body. Make sure you wash between any folds of skin and between your fingers and toes. Wash your body in the following order, switching to a new cloth after each step: ? The front of your neck, shoulders, and chest. ? Both of your arms, under your arms, and your hands. ? Your stomach and groin area, avoiding the genitals. ? Your right leg and foot. ? Your left leg and foot. ? The back of your neck, your back, and your buttocks. 2. Do not rinse. Discard the cloth and let the area air-dry. Do not put any substances on your body afterward--such as powder, lotion, or perfume--unless you are told to do so by your health care provider. Only use lotions that are recommended by the manufacturer. 3. Put on clean clothes or pajamas. Contact a health care provider if:  Your skin gets irritated after scrubbing.  You have questions about using your solution or cloth. Get help right away if:  Your eyes become very red or swollen.  Your eyes itch badly.  Your skin itches badly and is red or swollen.  Your hearing changes.  You have trouble seeing.  You have swelling or tingling in your mouth or throat.  You have trouble breathing.  You swallow any chlorhexidine. Summary  Chlorhexidine gluconate (CHG) is a germ-killing (antiseptic) solution that is used to clean the skin. Cleaning your skin with CHG may help to lower your risk for infection.  You may be given CHG to use for bathing. It may be in a bottle or in a prepackaged cloth to use on your skin. Carefully follow your  health care provider's instructions and the instructions on the product label.  Do not use CHG if you have a chlorhexidine allergy.  Contact your health care provider if your skin gets irritated after scrubbing. This information is not intended to replace advice given to you by your health care provider. Make sure you discuss any questions you have with your health care provider. Document Revised: 02/15/2019 Document Reviewed: 10/27/2017 Elsevier Patient Education  [place].

## 2020-01-18 ENCOUNTER — Other Ambulatory Visit: Payer: Self-pay

## 2020-01-18 DIAGNOSIS — C182 Malignant neoplasm of ascending colon: Secondary | ICD-10-CM

## 2020-01-21 ENCOUNTER — Other Ambulatory Visit: Payer: Self-pay

## 2020-01-21 ENCOUNTER — Other Ambulatory Visit (HOSPITAL_COMMUNITY)
Admission: RE | Admit: 2020-01-21 | Discharge: 2020-01-21 | Disposition: A | Discharge status: Home / Self Care | Payer: Medicare HMO | Source: Ambulatory Visit | Attending: General Surgery | Admitting: General Surgery

## 2020-01-21 ENCOUNTER — Encounter (HOSPITAL_COMMUNITY)
Admission: RE | Admit: 2020-01-21 | Discharge: 2020-01-21 | Disposition: A | Discharge status: Home / Self Care | Payer: Medicare HMO | Source: Ambulatory Visit | Attending: General Surgery | Admitting: General Surgery

## 2020-01-21 ENCOUNTER — Telehealth: Payer: Self-pay | Admitting: General Surgery

## 2020-01-21 ENCOUNTER — Encounter (HOSPITAL_COMMUNITY): Payer: Self-pay

## 2020-01-21 ENCOUNTER — Telehealth: Payer: Self-pay | Admitting: Cardiology

## 2020-01-21 DIAGNOSIS — I4891 Unspecified atrial fibrillation: Secondary | ICD-10-CM | POA: Diagnosis not present

## 2020-01-21 DIAGNOSIS — N400 Enlarged prostate without lower urinary tract symptoms: Secondary | ICD-10-CM | POA: Diagnosis not present

## 2020-01-21 DIAGNOSIS — Z01810 Encounter for preprocedural cardiovascular examination: Secondary | ICD-10-CM | POA: Insufficient documentation

## 2020-01-21 DIAGNOSIS — Z950 Presence of cardiac pacemaker: Secondary | ICD-10-CM | POA: Insufficient documentation

## 2020-01-21 DIAGNOSIS — Z01812 Encounter for preprocedural laboratory examination: Secondary | ICD-10-CM | POA: Diagnosis not present

## 2020-01-21 HISTORY — DX: Cardiac arrhythmia, unspecified: I49.9

## 2020-01-21 LAB — COMPREHENSIVE METABOLIC PANEL
ALT: 9 U/L (ref 0–44)
AST: 12 U/L — ABNORMAL LOW (ref 15–41)
Albumin: 4.2 g/dL (ref 3.5–5.0)
Alkaline Phosphatase: 45 U/L (ref 38–126)
Anion gap: 7 (ref 5–15)
BUN: 26 mg/dL — ABNORMAL HIGH (ref 8–23)
CO2: 24 mmol/L (ref 22–32)
Calcium: 9 mg/dL (ref 8.9–10.3)
Chloride: 108 mmol/L (ref 98–111)
Creatinine, Ser: 1.55 mg/dL — ABNORMAL HIGH (ref 0.61–1.24)
GFR calc Af Amer: 45 mL/min — ABNORMAL LOW (ref >60)
GFR calc non Af Amer: 39 mL/min — ABNORMAL LOW (ref >60)
Glucose, Bld: 112 mg/dL — ABNORMAL HIGH (ref 70–99)
Potassium: 4.6 mmol/L (ref 3.5–5.1)
Sodium: 139 mmol/L (ref 135–145)
Total Bilirubin: 0.4 mg/dL (ref 0.3–1.2)
Total Protein: 7 g/dL (ref 6.5–8.1)

## 2020-01-21 LAB — CBC WITH DIFFERENTIAL/PLATELET
Abs Immature Granulocytes: 0.03 10*3/uL (ref 0.00–0.07)
Basophils Absolute: 0 10*3/uL (ref 0.0–0.1)
Basophils Relative: 1 %
Eosinophils Absolute: 0.1 10*3/uL (ref 0.0–0.5)
Eosinophils Relative: 2 %
HCT: 29.7 % — ABNORMAL LOW (ref 39.0–52.0)
Hemoglobin: 8.8 g/dL — ABNORMAL LOW (ref 13.0–17.0)
Immature Granulocytes: 1 %
Lymphocytes Relative: 12 %
Lymphs Abs: 0.8 10*3/uL (ref 0.7–4.0)
MCH: 25.5 pg — ABNORMAL LOW (ref 26.0–34.0)
MCHC: 29.6 g/dL — ABNORMAL LOW (ref 30.0–36.0)
MCV: 86.1 fL (ref 80.0–100.0)
Monocytes Absolute: 0.3 10*3/uL (ref 0.1–1.0)
Monocytes Relative: 5 %
Neutro Abs: 4.9 10*3/uL (ref 1.7–7.7)
Neutrophils Relative %: 79 %
Platelets: 311 10*3/uL (ref 150–400)
RBC: 3.45 MIL/uL — ABNORMAL LOW (ref 4.22–5.81)
RDW: 14.5 % (ref 11.5–15.5)
WBC: 6.2 10*3/uL (ref 4.0–10.5)
nRBC: 0 % (ref 0.0–0.2)

## 2020-01-21 LAB — PSA: Prostatic Specific Antigen: 143.88 ng/mL — ABNORMAL HIGH (ref 0.00–4.00)

## 2020-01-21 LAB — TYPE AND SCREEN
ABO/RH(D): A POS
Antibody Screen: NEGATIVE

## 2020-01-21 LAB — SARS CORONAVIRUS 2 (TAT 6-24 HRS): SARS Coronavirus 2: NEGATIVE

## 2020-01-21 NOTE — Telephone Encounter (Signed)
Looking for clearance note

## 2020-01-21 NOTE — H&P (Signed)
[facility] History and Physical  Reason for Referral: Cecal colon cancer  Referring Physician: Dr. Gala Romney     Chief Complaint     Colon Cancer      Tony Holt is a 86 y.o. male.  HPI: Tony Holt is a very sweet 86 yo with a recent colonoscopy and EGD for anemia. He reported having dark stools but no red blood. He underwent endoscopy with Dr. Gala Romney and was found to have a cecal mass that was biospied and demonstrated adenocarcinoma. He had an EGD that demonstrated a Schatzki's ring, erosive gastropathy, and the colonoscopy demonstrated a benign rectal polyp and the cecal mass. Prior to the endoscopy he had new onset A fib that was rate controlled and cardiology saw him. They recommended no additional workup and felt he could proceed with his endoscopies. He has a prior history of sigmoid colon cancer s/p open colectomy with Dr. Romona Curls in 1999.  His daughter came with him to the office today. He live alone and carries out some activities of daily living including self care but he does not cook or clean or drive himself. His daughter has been staying with him more in the last 6 months as he has been more weak and they feel this is associated with the anemia. He is not on a blood thinner. He sees Cardiology for follow up on 01/14/2020. He denies any cardiac history or stroke history. He has had no weight loss.      Past Medical History:  Diagnosis Date  . BPH (benign prostatic hyperplasia)   . Colon cancer ([place]) 1999  . Essential hypertension   . GERD (gastroesophageal reflux disease)   . Hard of hearing         Past Surgical History:  Procedure Laterality Date  . BIOPSY  12/28/2019   Procedure: BIOPSY; Surgeon: Daneil Dolin, MD; Location: [facility]; Service: Endoscopy;; gastric, esophagus,cecal  . CATARACT EXTRACTION W/PHACO  01/18/2013   Procedure: CATARACT EXTRACTION PHACO AND INTRAOCULAR LENS PLACEMENT ([place]); Surgeon: Tonny Branch, MD; Location: [facility] ORS; Service:  Ophthalmology; Laterality: Left; CDE 17.11  . CATARACT EXTRACTION W/PHACO Right 02/05/2013   Procedure: CATARACT EXTRACTION PHACO AND INTRAOCULAR LENS PLACEMENT ([facility]); Surgeon: Tonny Branch, MD; Location: [facility] ORS; Service: Ophthalmology; Laterality: Right; CDE:19.49  . COLON SURGERY  1999   Sigmoid colon cancer, segmental resection by Drs. Smith/Bradford  . COLONOSCOPY  2002   Multiple adenomatous colon polyps  . COLONOSCOPY N/A 12/28/2019   Procedure: COLONOSCOPY; Surgeon: Daneil Dolin, MD; Location[facility]; Service: Endoscopy; Laterality: N/A;  . ESOPHAGOGASTRODUODENOSCOPY  03/25/2012   Dr. Gala Romney: Severe ulcerative/erosive reflux esophagitis. Small hiatal hernia. Abnormal gastric and duodenal mucosa, biopsy showed reactive changes and minimal chronic inflammation but no H. pylori.  . ESOPHAGOGASTRODUODENOSCOPY N/A 12/28/2019   Procedure: ESOPHAGOGASTRODUODENOSCOPY (EGD); Surgeon: Daneil Dolin, MD; Location: [facility]; Service: Endoscopy; Laterality: N/A;  . HERNIA REPAIR  02/2011   Right inguinal hernia repair with mesh, Dr. Geroge Baseman  . HERNIA REPAIR     three total  . POLYPECTOMY  12/28/2019   Procedure: POLYPECTOMY; Surgeon: Daneil Dolin, MD; Location: [facility]; Service: Endoscopy;; rectal         Family History  Problem Relation Age of Onset  . Heart attack Father    Deceased, age 66s  . Colon cancer Neg Hx   . Liver disease Neg Hx    Social History        Tobacco Use  .  Smoking status: Former Smoker    Types: Cigarettes  . Smokeless tobacco: Current User    Types: Snuff  Substance Use Topics  . Alcohol use: No    Alcohol/week: 0.0 standard drinks    Comment: previous heavy drinker at age 81-30  . Drug use: No   Medications: I have reviewed the patient's current medications.  Allergies as of 01/08/2020   No Known Allergies          Medication List       Accurate as of January 08, 2020 11:49 AM. If you have any questions, ask your nurse or  doctor.        diphenhydramine-acetaminophen 25-500 MG Tabs tablet  Commonly known as: TYLENOL PM  Take 2 tablets by mouth at bedtime as needed (sleep).   FLINTSTONES MULTIVITAMIN PO  Take 1 tablet by mouth daily. Has iron in this   HYDROcodone-acetaminophen 5-325 MG tablet  Commonly known as: NORCO/VICODIN  Take 1 tablet by mouth every 12 (twelve) hours as needed for moderate pain.   omeprazole 20 MG capsule  Commonly known as: PRILOSEC  Take 20 mg by mouth 2 (two) times daily.   tamsulosin 0.4 MG Caps capsule  Commonly known as: FLOMAX  Take 1 capsule (0.4 mg total) by mouth daily.       ROS:  A comprehensive review of systems was negative except for: Constitutional: positive for fatigue  Genitourinary: positive for frequency  Musculoskeletal: positive for back pain  GI- dark stools  Blood pressure 129/72, pulse 91, temperature 97.6 F (36.4 C), temperature source Oral, resp. rate 16, height 5\' 8"  (1.727 m), weight 132 lb (59.9 kg), SpO2 98 %.  Physical Exam  Vitals reviewed.  Constitutional:  General: He is not in acute distress. Appearance: He is underweight.  HENT:  Head: Normocephalic and atraumatic.  Nose: Nose normal.  Mouth/Throat:  Mouth: Mucous membranes are moist.  Eyes:  Extraocular Movements: Extraocular movements intact.  Pupils: Pupils are equal, round, and reactive to light.  Cardiovascular:  Rate and Rhythm: Normal rate.  Pulmonary:  Effort: Pulmonary effort is normal.  Breath sounds: Normal breath sounds.  Abdominal:  General: There is no distension.  Palpations: Abdomen is soft.  Tenderness: There is no abdominal tenderness.  Comments: Prior midline with no hernia  Musculoskeletal:  General: No swelling. Normal range of motion.  Cervical back: Normal range of motion. No rigidity.  Skin:  General: Skin is warm and dry.  Neurological:  General: No focal deficit present.  Mental Status: He is alert and oriented to person, place, and time.   Psychiatric:  Mood and Affect: Mood normal.  Behavior: Behavior normal.   Results:  Pathology:  FINAL MICROSCOPIC DIAGNOSIS:   A. STOMACH, BIOPSY:  - Reactive gastropathy with features consistent with proton pump  inhibitor effect  - No H. pylori or intestinal metaplasia identified  - See comment    B. ESOPHAGUS, BIOPSY:  - Minute fragments of benign squamous and glandular epithelium  - No intestinal metaplasia or malignancy identified   C. COLON, CECAL, MASS, BIOPSY:  - Adenocarcinoma  - See comment   D. RECTUM, POLYPECTOMY:  - Tubular adenoma (2 of 2 fragments)  - No high grade dysplasia or malignancy identified   Assessment & Plan:  Tony Holt is a 86 y.o. male with a newly diagnosed cecal cancer. He has had a prior sigmoid cancer in 1999. I am unsure if he received any chemotherapy at that time. He has been weak but  not really having any weight loss. He otherwise has no cardiac issues except for this new onset A Fib. We discussed the colon cancer and the progression to worsening anemia and obstruction as the cancer grows. The patient still lives independently and wants to try to avoid getting an obstruction and suffering from further symptoms from the anemia. The patient and daughter want to proceed with surgery. We discussed that we do laparoscopic surgeries for some people, but given this age and the prior surgery and in an effort to reduce anesthesia time that an open partial colectomy would be the best option. We discussed that he would do a bowel preparation before surgery. We discussed that we would want cardiology to risk stratify him prior to the surgery. We discussed the risk of surgery including bleeding, infection, injury to other organs, anastomotic leak. We discussed that he may ultimately need rehab/ SNF if he gets weaker after surgery and that he is at risk for delirium in the hospital due to this age. We discussed likely a 5 day or so course in the hospital, and  that we would refer him to Oncology following the surgery but that he did not have to pursue chemotherapy if offered. Since he has had a prior sigmoid colon cancer, I think we should get preoperative CT chest /abd/pelvis to stage him.  -CT chest, abdomen/pelvis for cancer staging  -Cardiology to see 2/1, will ask Dr. Harl Bowie to weigh in on risk stratification  -Patient to drink lots of boost/ ensure prior to surgery to help with protein calories  Colon Preparation:  Buy from the Store:  Miralax bottle (288g).  Gatorade 64 oz (not red).  Dulcolax tablets.  The Day Prior to Surgery:  Take 4 ducolax tablets at 7am with water.  Drink plenty of clear liquids all day to avoid dehydration, no solid food.  Mix the bottle of Miralax and 64 oz of Gatorade and drink this mixture starting at 10am. Drink it gradually over the next few hours, 8 ounces every 15-30 minutes until it is gone. Finish this by 2pm.  Take 2 neomycin 500mg  tablets and 2 metronidazole 500mg  tablets at 2 pm.  Take 2 neomycin 500mg  tablets and 2 metronidazole 500mg  tablets at 3pm.  Take 2 neomycin 500mg  tablets and 2 metronidazole 500mg  tablets at 10pm.  Do not eat or drink anything after midnight the night before your surgery.  Do not eat or drink anything that morning, and take medications as instructed by the hospital staff on your preoperative visit.  All questions were answered to the satisfaction of the patient and family.  Virl Cagey  01/08/2020, 11:49 AM

## 2020-01-21 NOTE — Telephone Encounter (Signed)
[facility]  Notified patient via his daughter [name] that CT without evidence of metastatic disease from the colon.   The CT did demonstrate concern for prostate cancer. I have ordered a PSA. I have talked to Dr. Alyson Ingles and he has looked at the CT.  He will see the patient in the clinic and may need to do a cystoscopy.   ECHO without concerning feature.   Curlene Labrum, MD [facility] [address] [place], [place] [address] [id number] [phone] (office)

## 2020-01-22 ENCOUNTER — Telehealth: Payer: Self-pay | Admitting: *Deleted

## 2020-01-22 ENCOUNTER — Telehealth: Payer: Self-pay | Admitting: General Surgery

## 2020-01-22 ENCOUNTER — Encounter (HOSPITAL_COMMUNITY): Payer: Self-pay | Admitting: Anesthesiology

## 2020-01-22 ENCOUNTER — Telehealth: Payer: Self-pay | Admitting: Cardiology

## 2020-01-22 DIAGNOSIS — R0600 Dyspnea, unspecified: Secondary | ICD-10-CM

## 2020-01-22 NOTE — Telephone Encounter (Signed)
Echo overall benign, we are working to arrange an outpatient stress test. Final cardiac clearance pending stress results. I have asked our nursing staff to notify patient about the scheduling and also contact Dr Constance Haw office, would postpone tomorrows surgery until testing completed   Zandra Abts MD

## 2020-01-22 NOTE — Telephone Encounter (Signed)
[name]   Dr. Harl Bowie has decided to do a stress test.  The patient had started his bowel prep but has not taken the antibiotic.  He is going to be scheduled for the stress test and then we will get the surgery rescheduled.   Apologized to [place] and Tony Holt that this has occurred, we were not expecting the stress test.   Will be in touch with timing for surgery. Referral to Dr. Alyson Ingles made for Prostate cancer, PSA high at 143.88.    Curlene Labrum, MD [facility] [address] [place], [place] [address] [id number] [phone] (office)

## 2020-01-22 NOTE — Telephone Encounter (Signed)
-----   Message from Arnoldo Lenis, MD sent at 01/22/2020 11:28 AM EST ----- This patient needs a lexiscan for dyspnea. He will need to postpone his surgery until stress test is complete. Please arrest test and contact patient and family   Zandra Abts MD

## 2020-01-22 NOTE — Telephone Encounter (Signed)
Pt and family voiced understanding - orders placed and will forward to schedulers

## 2020-01-22 NOTE — Progress Notes (Signed)
Patient has new onset Atrial Fib.  Contacted Cardiology for clearance.

## 2020-01-22 NOTE — Telephone Encounter (Signed)
-----   Message from Arnoldo Lenis, MD sent at 01/22/2020 11:30 AM EST ----- Can we also call Dr Curlene Labrum office and let them know we are arranging the stress test and would postpone tomorrows surgery until results are back   Zandra Abts MD

## 2020-01-22 NOTE — Telephone Encounter (Signed)
See previous phone note - pt needs lexiscan and surgery need to be postponed - pt and family aware and message sent to Dr Constance Haw

## 2020-01-22 NOTE — Telephone Encounter (Signed)
Unable to find correct phone number for Dr Constance Haw office - located fax number - will route and fax this message to Dr Constance Haw via [employer] as well

## 2020-01-23 ENCOUNTER — Other Ambulatory Visit (HOSPITAL_COMMUNITY): Payer: Self-pay | Admitting: Cardiology

## 2020-01-23 ENCOUNTER — Telehealth (HOSPITAL_COMMUNITY): Payer: Self-pay | Admitting: *Deleted

## 2020-01-23 DIAGNOSIS — R0602 Shortness of breath: Secondary | ICD-10-CM

## 2020-01-23 NOTE — Telephone Encounter (Signed)
Called patient's daughter and scheduled Tony Holt for his Stress Test on 01/25/20. Instructions given for stress test.  Kirstie Peri

## 2020-01-25 ENCOUNTER — Ambulatory Visit (HOSPITAL_COMMUNITY): Payer: Medicare HMO | Attending: Internal Medicine

## 2020-01-25 ENCOUNTER — Other Ambulatory Visit: Payer: Self-pay

## 2020-01-25 DIAGNOSIS — R0602 Shortness of breath: Secondary | ICD-10-CM | POA: Diagnosis not present

## 2020-01-25 LAB — MYOCARDIAL PERFUSION IMAGING
LV dias vol: 74 mL (ref 62–150)
LV sys vol: 29 mL
Peak HR: 95 {beats}/min
Rest HR: 78 {beats}/min
SDS: 0
SRS: 0
SSS: 0
TID: 1.01

## 2020-01-25 MED ORDER — TECHNETIUM TC 99M TETROFOSMIN IV KIT
9.8000 | PACK | Freq: Once | INTRAVENOUS | Status: AC | PRN
  Administered 2020-01-25: 9.8 via INTRAVENOUS
  Filled 2020-01-25: qty 10

## 2020-01-25 MED ORDER — TECHNETIUM TC 99M TETROFOSMIN IV KIT
31.5000 | PACK | Freq: Once | INTRAVENOUS | Status: AC | PRN
  Administered 2020-01-25: 31.5 via INTRAVENOUS
  Filled 2020-01-25: qty 32

## 2020-01-25 MED ORDER — REGADENOSON 0.4 MG/5ML IV SOLN
0.4000 mg | Freq: Once | INTRAVENOUS | Status: AC
  Administered 2020-01-25: 10:00:00 0.4 mg via INTRAVENOUS

## 2020-01-28 ENCOUNTER — Telehealth: Payer: Self-pay | Admitting: General Surgery

## 2020-01-28 NOTE — Progress Notes (Signed)
Pt made aware. Voiced understanding. Will forward to pcp

## 2020-01-28 NOTE — Telephone Encounter (Signed)
[facility]  Patient stress test low risk. Plan for OR 02/04/2020.  Preop labs done on 2/8. Will need COVID testing. OR will call them.    Curlene Labrum, MD [facility] [address] [place], [place] [address] [id number] [phone] (office)

## 2020-01-31 ENCOUNTER — Other Ambulatory Visit (HOSPITAL_COMMUNITY): Admission: RE | Admit: 2020-01-31 | Payer: Medicare HMO | Source: Ambulatory Visit

## 2020-01-31 ENCOUNTER — Encounter (HOSPITAL_COMMUNITY): Payer: Medicare HMO

## 2020-02-01 ENCOUNTER — Other Ambulatory Visit (HOSPITAL_COMMUNITY)
Admission: RE | Admit: 2020-02-01 | Discharge: 2020-02-01 | Disposition: A | Discharge status: Home / Self Care | Payer: Medicare HMO | Source: Ambulatory Visit | Attending: General Surgery | Admitting: General Surgery

## 2020-02-01 ENCOUNTER — Encounter (HOSPITAL_COMMUNITY)
Admission: RE | Admit: 2020-02-01 | Discharge: 2020-02-01 | Disposition: A | Discharge status: Home / Self Care | Payer: Medicare HMO | Source: Ambulatory Visit | Attending: General Surgery | Admitting: General Surgery

## 2020-02-01 ENCOUNTER — Other Ambulatory Visit: Payer: Self-pay

## 2020-02-01 DIAGNOSIS — Z01812 Encounter for preprocedural laboratory examination: Secondary | ICD-10-CM | POA: Insufficient documentation

## 2020-02-01 DIAGNOSIS — K660 Peritoneal adhesions (postprocedural) (postinfection): Secondary | ICD-10-CM | POA: Diagnosis present

## 2020-02-01 DIAGNOSIS — Z9049 Acquired absence of other specified parts of digestive tract: Secondary | ICD-10-CM | POA: Diagnosis not present

## 2020-02-01 DIAGNOSIS — R54 Age-related physical debility: Secondary | ICD-10-CM | POA: Diagnosis present

## 2020-02-01 DIAGNOSIS — D630 Anemia in neoplastic disease: Secondary | ICD-10-CM | POA: Diagnosis present

## 2020-02-01 DIAGNOSIS — C180 Malignant neoplasm of cecum: Secondary | ICD-10-CM | POA: Diagnosis present

## 2020-02-01 DIAGNOSIS — D120 Benign neoplasm of cecum: Secondary | ICD-10-CM | POA: Diagnosis not present

## 2020-02-01 DIAGNOSIS — Z6821 Body mass index (BMI) 21.0-21.9, adult: Secondary | ICD-10-CM | POA: Diagnosis not present

## 2020-02-01 DIAGNOSIS — K222 Esophageal obstruction: Secondary | ICD-10-CM | POA: Diagnosis present

## 2020-02-01 DIAGNOSIS — Z79899 Other long term (current) drug therapy: Secondary | ICD-10-CM | POA: Diagnosis not present

## 2020-02-01 DIAGNOSIS — Z87891 Personal history of nicotine dependence: Secondary | ICD-10-CM | POA: Diagnosis not present

## 2020-02-01 DIAGNOSIS — I10 Essential (primary) hypertension: Secondary | ICD-10-CM | POA: Diagnosis present

## 2020-02-01 DIAGNOSIS — R636 Underweight: Secondary | ICD-10-CM | POA: Diagnosis present

## 2020-02-01 DIAGNOSIS — Z8249 Family history of ischemic heart disease and other diseases of the circulatory system: Secondary | ICD-10-CM | POA: Diagnosis not present

## 2020-02-01 DIAGNOSIS — K219 Gastro-esophageal reflux disease without esophagitis: Secondary | ICD-10-CM | POA: Diagnosis present

## 2020-02-01 DIAGNOSIS — Z85038 Personal history of other malignant neoplasm of large intestine: Secondary | ICD-10-CM | POA: Diagnosis not present

## 2020-02-01 DIAGNOSIS — N179 Acute kidney failure, unspecified: Secondary | ICD-10-CM | POA: Diagnosis not present

## 2020-02-01 DIAGNOSIS — N400 Enlarged prostate without lower urinary tract symptoms: Secondary | ICD-10-CM | POA: Diagnosis present

## 2020-02-01 DIAGNOSIS — Z20822 Contact with and (suspected) exposure to covid-19: Secondary | ICD-10-CM | POA: Diagnosis present

## 2020-02-01 DIAGNOSIS — I4891 Unspecified atrial fibrillation: Secondary | ICD-10-CM | POA: Diagnosis present

## 2020-02-01 LAB — SARS CORONAVIRUS 2 (TAT 6-24 HRS): SARS Coronavirus 2: NEGATIVE

## 2020-02-01 LAB — PREPARE RBC (CROSSMATCH)

## 2020-02-04 ENCOUNTER — Other Ambulatory Visit: Payer: Self-pay

## 2020-02-04 ENCOUNTER — Inpatient Hospital Stay (HOSPITAL_COMMUNITY): Payer: Medicare HMO | Admitting: Anesthesiology

## 2020-02-04 ENCOUNTER — Inpatient Hospital Stay (HOSPITAL_COMMUNITY)
Admission: RE | Admit: 2020-02-04 | Discharge: 2020-02-07 | DRG: 330 | Disposition: A | Discharge status: Home Health | Payer: Medicare HMO | Attending: General Surgery | Admitting: General Surgery

## 2020-02-04 ENCOUNTER — Encounter (HOSPITAL_COMMUNITY): Payer: Self-pay | Admitting: General Surgery

## 2020-02-04 ENCOUNTER — Encounter (HOSPITAL_COMMUNITY)
Admission: RE | Disposition: A | Discharge status: Home / Self Care | Payer: Self-pay | Source: Home / Self Care | Attending: General Surgery

## 2020-02-04 DIAGNOSIS — Z79899 Other long term (current) drug therapy: Secondary | ICD-10-CM | POA: Diagnosis not present

## 2020-02-04 DIAGNOSIS — I10 Essential (primary) hypertension: Secondary | ICD-10-CM | POA: Diagnosis present

## 2020-02-04 DIAGNOSIS — Z20822 Contact with and (suspected) exposure to covid-19: Secondary | ICD-10-CM | POA: Diagnosis present

## 2020-02-04 DIAGNOSIS — K660 Peritoneal adhesions (postprocedural) (postinfection): Secondary | ICD-10-CM | POA: Diagnosis present

## 2020-02-04 DIAGNOSIS — Z87891 Personal history of nicotine dependence: Secondary | ICD-10-CM

## 2020-02-04 DIAGNOSIS — C180 Malignant neoplasm of cecum: Secondary | ICD-10-CM | POA: Diagnosis present

## 2020-02-04 DIAGNOSIS — R636 Underweight: Secondary | ICD-10-CM | POA: Diagnosis present

## 2020-02-04 DIAGNOSIS — Z85038 Personal history of other malignant neoplasm of large intestine: Secondary | ICD-10-CM | POA: Diagnosis not present

## 2020-02-04 DIAGNOSIS — N179 Acute kidney failure, unspecified: Secondary | ICD-10-CM | POA: Diagnosis not present

## 2020-02-04 DIAGNOSIS — I4891 Unspecified atrial fibrillation: Secondary | ICD-10-CM | POA: Diagnosis present

## 2020-02-04 DIAGNOSIS — K222 Esophageal obstruction: Secondary | ICD-10-CM | POA: Diagnosis present

## 2020-02-04 DIAGNOSIS — K219 Gastro-esophageal reflux disease without esophagitis: Secondary | ICD-10-CM | POA: Diagnosis present

## 2020-02-04 DIAGNOSIS — Z8249 Family history of ischemic heart disease and other diseases of the circulatory system: Secondary | ICD-10-CM | POA: Diagnosis not present

## 2020-02-04 DIAGNOSIS — N400 Enlarged prostate without lower urinary tract symptoms: Secondary | ICD-10-CM | POA: Diagnosis present

## 2020-02-04 DIAGNOSIS — Z6821 Body mass index (BMI) 21.0-21.9, adult: Secondary | ICD-10-CM | POA: Diagnosis not present

## 2020-02-04 DIAGNOSIS — Z9049 Acquired absence of other specified parts of digestive tract: Secondary | ICD-10-CM

## 2020-02-04 DIAGNOSIS — R54 Age-related physical debility: Secondary | ICD-10-CM | POA: Diagnosis present

## 2020-02-04 DIAGNOSIS — D630 Anemia in neoplastic disease: Secondary | ICD-10-CM | POA: Diagnosis present

## 2020-02-04 HISTORY — PX: PARTIAL COLECTOMY: SHX5273

## 2020-02-04 SURGERY — COLECTOMY, PARTIAL
Anesthesia: General | Site: Abdomen | Laterality: Right

## 2020-02-04 MED ORDER — HEPARIN SODIUM (PORCINE) 5000 UNIT/ML IJ SOLN
5000.0000 [IU] | Freq: Three times a day (TID) | INTRAMUSCULAR | Status: DC
  Administered 2020-02-04 – 2020-02-06 (×7): 5000 [IU] via SUBCUTANEOUS
  Filled 2020-02-04 (×8): qty 1

## 2020-02-04 MED ORDER — BUPIVACAINE LIPOSOME 1.3 % IJ SUSP
INTRAMUSCULAR | Status: DC | PRN
  Administered 2020-02-04: 20 mL

## 2020-02-04 MED ORDER — KETOROLAC TROMETHAMINE 15 MG/ML IJ SOLN
INTRAMUSCULAR | Status: AC
  Filled 2020-02-04: qty 1

## 2020-02-04 MED ORDER — LIDOCAINE 2% (20 MG/ML) 5 ML SYRINGE
INTRAMUSCULAR | Status: AC
  Filled 2020-02-04: qty 5

## 2020-02-04 MED ORDER — HYDROMORPHONE HCL 1 MG/ML IJ SOLN
0.2500 mg | INTRAMUSCULAR | Status: DC | PRN
  Administered 2020-02-04 (×2): 0.25 mg via INTRAVENOUS
  Filled 2020-02-04: qty 0.5

## 2020-02-04 MED ORDER — FENTANYL CITRATE (PF) 100 MCG/2ML IJ SOLN
INTRAMUSCULAR | Status: AC
  Filled 2020-02-04: qty 2

## 2020-02-04 MED ORDER — SUGAMMADEX SODIUM 500 MG/5ML IV SOLN
INTRAVENOUS | Status: AC
  Filled 2020-02-04: qty 5

## 2020-02-04 MED ORDER — DIPHENHYDRAMINE HCL 12.5 MG/5ML PO ELIX: 12.5000 mg | ORAL_SOLUTION | Freq: Four times a day (QID) | ORAL | Status: DC | PRN

## 2020-02-04 MED ORDER — KETOROLAC TROMETHAMINE 15 MG/ML IJ SOLN
15.0000 mg | Freq: Once | INTRAMUSCULAR | Status: AC
  Administered 2020-02-04: 12:00:00 15 mg via INTRAVENOUS

## 2020-02-04 MED ORDER — DIPHENHYDRAMINE HCL 50 MG/ML IJ SOLN: 12.5000 mg | Freq: Four times a day (QID) | INTRAMUSCULAR | Status: DC | PRN

## 2020-02-04 MED ORDER — ALVIMOPAN 12 MG PO CAPS
12.0000 mg | ORAL_CAPSULE | ORAL | Status: AC
  Administered 2020-02-04: 08:00:00 12 mg via ORAL

## 2020-02-04 MED ORDER — SUCCINYLCHOLINE CHLORIDE 200 MG/10ML IV SOSY
PREFILLED_SYRINGE | INTRAVENOUS | Status: AC
  Filled 2020-02-04: qty 10

## 2020-02-04 MED ORDER — SUGAMMADEX SODIUM 200 MG/2ML IV SOLN
INTRAVENOUS | Status: DC | PRN
  Administered 2020-02-04: 200 mg via INTRAVENOUS

## 2020-02-04 MED ORDER — HEPARIN SODIUM (PORCINE) 5000 UNIT/ML IJ SOLN
5000.0000 [IU] | Freq: Once | INTRAMUSCULAR | Status: AC
  Administered 2020-02-04: 09:00:00 5000 [IU] via SUBCUTANEOUS
  Filled 2020-02-04: qty 1

## 2020-02-04 MED ORDER — PHENYLEPHRINE HCL (PRESSORS) 10 MG/ML IV SOLN
INTRAVENOUS | Status: AC
  Filled 2020-02-04: qty 1

## 2020-02-04 MED ORDER — DIPHENHYDRAMINE HCL 50 MG/ML IJ SOLN
INTRAMUSCULAR | Status: DC | PRN
  Administered 2020-02-04: 12.5 mg via INTRAVENOUS

## 2020-02-04 MED ORDER — ONDANSETRON HCL 4 MG/2ML IJ SOLN
4.0000 mg | Freq: Four times a day (QID) | INTRAMUSCULAR | Status: DC | PRN
  Administered 2020-02-04: 4 mg via INTRAVENOUS
  Filled 2020-02-04: qty 2

## 2020-02-04 MED ORDER — HYDROCODONE-ACETAMINOPHEN 7.5-325 MG PO TABS: 1.0000 | ORAL_TABLET | Freq: Once | ORAL | Status: DC | PRN

## 2020-02-04 MED ORDER — OXYCODONE HCL 5 MG PO TABS
5.0000 mg | ORAL_TABLET | ORAL | Status: DC | PRN
  Administered 2020-02-05 – 2020-02-06 (×3): 5 mg via ORAL
  Filled 2020-02-04 (×4): qty 1

## 2020-02-04 MED ORDER — 0.9 % SODIUM CHLORIDE (POUR BTL) OPTIME
TOPICAL | Status: DC | PRN
  Administered 2020-02-04: 11:00:00 1000 mL

## 2020-02-04 MED ORDER — MORPHINE SULFATE (PF) 2 MG/ML IV SOLN
2.0000 mg | INTRAVENOUS | Status: DC | PRN
  Administered 2020-02-04: 14:00:00 2 mg via INTRAVENOUS
  Filled 2020-02-04: qty 1

## 2020-02-04 MED ORDER — METOPROLOL TARTRATE 5 MG/5ML IV SOLN: 5.0000 mg | Freq: Four times a day (QID) | INTRAVENOUS | Status: DC | PRN

## 2020-02-04 MED ORDER — LACTATED RINGERS IV SOLN: INTRAVENOUS | Status: DC

## 2020-02-04 MED ORDER — TRAMADOL HCL 50 MG PO TABS: 50.0000 mg | ORAL_TABLET | Freq: Four times a day (QID) | ORAL | Status: DC | PRN

## 2020-02-04 MED ORDER — CHLORHEXIDINE GLUCONATE CLOTH 2 % EX PADS: 6.0000 | MEDICATED_PAD | Freq: Once | CUTANEOUS | Status: DC

## 2020-02-04 MED ORDER — ONDANSETRON 4 MG PO TBDP: 4.0000 mg | ORAL_TABLET | Freq: Four times a day (QID) | ORAL | Status: DC | PRN

## 2020-02-04 MED ORDER — SUCCINYLCHOLINE CHLORIDE 20 MG/ML IJ SOLN
INTRAMUSCULAR | Status: DC | PRN
  Administered 2020-02-04: 100 mg via INTRAVENOUS

## 2020-02-04 MED ORDER — ALVIMOPAN 12 MG PO CAPS
ORAL_CAPSULE | ORAL | Status: AC
  Filled 2020-02-04: qty 1

## 2020-02-04 MED ORDER — SIMETHICONE 80 MG PO CHEW
40.0000 mg | CHEWABLE_TABLET | Freq: Four times a day (QID) | ORAL | Status: DC | PRN
  Administered 2020-02-05: 01:00:00 40 mg via ORAL
  Filled 2020-02-04: qty 1

## 2020-02-04 MED ORDER — ROCURONIUM BROMIDE 100 MG/10ML IV SOLN
INTRAVENOUS | Status: DC | PRN
  Administered 2020-02-04: 40 mg via INTRAVENOUS
  Administered 2020-02-04: 10 mg via INTRAVENOUS

## 2020-02-04 MED ORDER — FENTANYL CITRATE (PF) 100 MCG/2ML IJ SOLN
INTRAMUSCULAR | Status: DC | PRN
  Administered 2020-02-04 (×2): 50 ug via INTRAVENOUS
  Administered 2020-02-04 (×2): 100 ug via INTRAVENOUS

## 2020-02-04 MED ORDER — ALVIMOPAN 12 MG PO CAPS
12.0000 mg | ORAL_CAPSULE | Freq: Two times a day (BID) | ORAL | Status: DC
  Administered 2020-02-05: 09:00:00 12 mg via ORAL
  Filled 2020-02-04 (×2): qty 1

## 2020-02-04 MED ORDER — PHENYLEPHRINE 40 MCG/ML (10ML) SYRINGE FOR IV PUSH (FOR BLOOD PRESSURE SUPPORT)
PREFILLED_SYRINGE | INTRAVENOUS | Status: AC
  Filled 2020-02-04: qty 10

## 2020-02-04 MED ORDER — ACETAMINOPHEN 500 MG PO TABS
1000.0000 mg | ORAL_TABLET | Freq: Four times a day (QID) | ORAL | Status: DC
  Administered 2020-02-04 – 2020-02-07 (×9): 1000 mg via ORAL
  Filled 2020-02-04 (×9): qty 2

## 2020-02-04 MED ORDER — PHENYLEPHRINE HCL (PRESSORS) 10 MG/ML IV SOLN
INTRAVENOUS | Status: DC | PRN
  Administered 2020-02-04 (×4): 100 ug via INTRAVENOUS

## 2020-02-04 MED ORDER — ROCURONIUM BROMIDE 10 MG/ML (PF) SYRINGE
PREFILLED_SYRINGE | INTRAVENOUS | Status: AC
  Filled 2020-02-04: qty 10

## 2020-02-04 MED ORDER — PROPOFOL 10 MG/ML IV BOLUS
INTRAVENOUS | Status: DC | PRN
  Administered 2020-02-04: 100 mg via INTRAVENOUS

## 2020-02-04 MED ORDER — SODIUM CHLORIDE 0.9 % IV SOLN
2.0000 g | Freq: Two times a day (BID) | INTRAVENOUS | Status: AC
  Administered 2020-02-04 – 2020-02-06 (×5): 2 g via INTRAVENOUS
  Filled 2020-02-04 (×5): qty 2

## 2020-02-04 MED ORDER — DOCUSATE SODIUM 100 MG PO CAPS
100.0000 mg | ORAL_CAPSULE | Freq: Two times a day (BID) | ORAL | Status: DC
  Administered 2020-02-04 – 2020-02-07 (×7): 100 mg via ORAL
  Filled 2020-02-04 (×7): qty 1

## 2020-02-04 MED ORDER — PANTOPRAZOLE SODIUM 40 MG PO TBEC
40.0000 mg | DELAYED_RELEASE_TABLET | Freq: Every day | ORAL | Status: DC
  Administered 2020-02-05 – 2020-02-07 (×3): 40 mg via ORAL
  Filled 2020-02-04 (×3): qty 1

## 2020-02-04 MED ORDER — SODIUM CHLORIDE 0.9 % IV SOLN
2.0000 g | INTRAVENOUS | Status: AC
  Administered 2020-02-04: 2 g via INTRAVENOUS
  Filled 2020-02-04: qty 2

## 2020-02-04 MED ORDER — PROMETHAZINE HCL 25 MG/ML IJ SOLN: 6.2500 mg | INTRAMUSCULAR | Status: DC | PRN

## 2020-02-04 MED ORDER — LIDOCAINE HCL (CARDIAC) PF 100 MG/5ML IV SOSY
PREFILLED_SYRINGE | INTRAVENOUS | Status: DC | PRN
  Administered 2020-02-04: 60 mg via INTRATRACHEAL

## 2020-02-04 MED ORDER — MIDAZOLAM HCL 2 MG/2ML IJ SOLN: 0.5000 mg | Freq: Once | INTRAMUSCULAR | Status: DC | PRN

## 2020-02-04 MED ORDER — ACETAMINOPHEN 500 MG PO TABS
1000.0000 mg | ORAL_TABLET | ORAL | Status: AC
  Administered 2020-02-04: 08:00:00 1000 mg via ORAL
  Filled 2020-02-04: qty 2

## 2020-02-04 MED ORDER — TAMSULOSIN HCL 0.4 MG PO CAPS
0.4000 mg | ORAL_CAPSULE | Freq: Every day | ORAL | Status: DC
  Administered 2020-02-05 – 2020-02-07 (×3): 0.4 mg via ORAL
  Filled 2020-02-04 (×3): qty 1

## 2020-02-04 MED ORDER — GABAPENTIN 300 MG PO CAPS
300.0000 mg | ORAL_CAPSULE | Freq: Two times a day (BID) | ORAL | Status: DC
  Administered 2020-02-04 – 2020-02-07 (×7): 300 mg via ORAL
  Filled 2020-02-04 (×7): qty 1

## 2020-02-04 SURGICAL SUPPLY — 59 items
BAG HAMPER (MISCELLANEOUS) ×3 IMPLANT
BARRIER SKIN 2 3/4 (OSTOMY) IMPLANT
BARRIER SKIN 2 3/4 INCH (OSTOMY)
BARRIER SKIN OD2.25 2 3/4 FLNG (OSTOMY) IMPLANT
BRR SKN FLT 2.75X2.25 2 PC (OSTOMY)
CELLS DAT CNTRL 66122 CELL SVR (MISCELLANEOUS) ×1 IMPLANT
CLAMP POUCH DRAINAGE QUIET (OSTOMY) IMPLANT
COVER LIGHT HANDLE STERIS (MISCELLANEOUS) ×6 IMPLANT
COVER WAND RF STERILE (DRAPES) ×3 IMPLANT
DRSG OPSITE POSTOP 4X10 (GAUZE/BANDAGES/DRESSINGS) ×2 IMPLANT
DRSG OPSITE POSTOP 4X8 (GAUZE/BANDAGES/DRESSINGS) IMPLANT
ELECT REM PT RETURN 9FT ADLT (ELECTROSURGICAL) ×3
ELECTRODE REM PT RTRN 9FT ADLT (ELECTROSURGICAL) ×1 IMPLANT
GLOVE BIO SURGEON STRL SZ 6.5 (GLOVE) ×4 IMPLANT
GLOVE BIO SURGEONS STRL SZ 6.5 (GLOVE) ×2
GLOVE BIOGEL PI IND STRL 6.5 (GLOVE) ×2 IMPLANT
GLOVE BIOGEL PI IND STRL 7.0 (GLOVE) ×6 IMPLANT
GLOVE BIOGEL PI INDICATOR 6.5 (GLOVE) ×4
GLOVE BIOGEL PI INDICATOR 7.0 (GLOVE) ×12
GLOVE SURG SS PI 7.5 STRL IVOR (GLOVE) IMPLANT
GOWN STRL REUS W/TWL LRG LVL3 (GOWN DISPOSABLE) ×18 IMPLANT
INST SET MAJOR GENERAL (KITS) ×3 IMPLANT
KIT TURNOVER KIT A (KITS) ×3 IMPLANT
LIGASURE IMPACT 36 18CM CVD LR (INSTRUMENTS) ×3 IMPLANT
MANIFOLD NEPTUNE II (INSTRUMENTS) ×3 IMPLANT
NEEDLE HYPO 18GX1.5 BLUNT FILL (NEEDLE) ×3 IMPLANT
NEEDLE HYPO 21X1.5 SAFETY (NEEDLE) ×3 IMPLANT
NS IRRIG 1000ML POUR BTL (IV SOLUTION) ×6 IMPLANT
PACK COLON (CUSTOM PROCEDURE TRAY) ×3 IMPLANT
PAD ARMBOARD 7.5X6 YLW CONV (MISCELLANEOUS) ×3 IMPLANT
PENCIL HANDSWITCHING (ELECTRODE) ×3 IMPLANT
PENCIL SMOKE EVACUATOR COATED (MISCELLANEOUS) ×2 IMPLANT
POUCH OSTOMY 2 3/4  H 3804 (WOUND CARE)
POUCH OSTOMY 2 3/4 H 3804 (WOUND CARE)
POUCH OSTOMY 2 PC DRNBL 2.25 (WOUND CARE) IMPLANT
POUCH OSTOMY 2 PC DRNBL 2.75 (WOUND CARE) IMPLANT
POUCH OSTOMY DRNBL 2 1/4 (WOUND CARE)
RELOAD LINEAR CUT PROX 55 BLUE (ENDOMECHANICALS) IMPLANT
RELOAD PROXIMATE 75MM BLUE (ENDOMECHANICALS) ×6 IMPLANT
RELOAD STAPLE 55 3.8 BLU REG (ENDOMECHANICALS) IMPLANT
RETRACTOR WND ALEXIS 18 MED (MISCELLANEOUS) IMPLANT
RETRACTOR WND ALEXIS 25 LRG (MISCELLANEOUS) ×1 IMPLANT
RTRCTR WOUND ALEXIS 18CM MED (MISCELLANEOUS) ×3
RTRCTR WOUND ALEXIS 25CM LRG (MISCELLANEOUS) ×3
SPONGE LAP 18X18 RF (DISPOSABLE) IMPLANT
STAPLER GUN LINEAR PROX 60 (STAPLE) ×3 IMPLANT
STAPLER PROXIMATE 55 BLUE (STAPLE) IMPLANT
STAPLER PROXIMATE 75MM BLUE (STAPLE) ×3 IMPLANT
STAPLER VISISTAT (STAPLE) ×3 IMPLANT
SUT CHROMIC 0 SH (SUTURE) IMPLANT
SUT CHROMIC 2 0 SH (SUTURE) ×2 IMPLANT
SUT CHROMIC 3 0 SH 27 (SUTURE) IMPLANT
SUT PDS AB CT VIOLET #0 27IN (SUTURE) ×6 IMPLANT
SUT SILK 3 0 SH CR/8 (SUTURE) ×6 IMPLANT
SUT VIC AB 3-0 SH 27 (SUTURE) ×3
SUT VIC AB 3-0 SH 27X BRD (SUTURE) ×1 IMPLANT
SYR 20ML LL LF (SYRINGE) ×3 IMPLANT
TRAY FOLEY MTR SLVR 16FR STAT (SET/KITS/TRAYS/PACK) ×3 IMPLANT
YANKAUER SUCT BULB TIP 10FT TU (MISCELLANEOUS) ×3 IMPLANT

## 2020-02-04 NOTE — Op Note (Signed)
[facility] Operative Note  02/04/20  Preoperative Diagnosis: Cecal cancer    Postoperative Diagnosis: Same   Procedure(s) Performed:  Right hemicolectomy   Surgeon: Ria Comment C. Constance Haw, MD   Assistants: Aviva Signs, MD     Anesthesia: General endotracheal   Anesthesiologist: Lenice Llamas, MD    Specimens:  Right colon   Estimated Blood Loss: Minimal   Blood Replacement: None    Complications: None   Wound Class:Clean contaminated    Operative Indications: Mr. Glinsky is a 86 yo with a newly found cecal cancer at the ileocecal valve and prior history of colon cancer and resection. He has some memory deficits but overall performs many of his ADLS. He was evaluated by cardiology preoperatively and had a stress test. He was found to have a low risk test. He has A fib but is rate controlled and has not been started on anticoagulation due to his age and need for surgery.  We discussed the risk of surgery and the need for surgery to prevent obstruction. Discussed the risk of bleeding, infection, anastomotic leak, and need for further procedures, and the patient and his daughter opted to proceed.   Findings: Cecal mass palpated    Procedure: The patient was taken to the operating room and placed supine. General endotracheal anesthesia was induced. Intravenous antibiotics were administered per protocol.  An orogastric tube positioned to decompress the stomach.  A foley catheter was placed. The abdomen was prepared and draped in the usual sterile fashion.   The prior midline was incised and carried down through to the fascia. Care was taken on entry as there was omental adhesions to the anterior abdominal wall and midline. These were taken down with Ligasure and cautery dissection.  Once this was performed a wound protector was placed. The white line of Toldt was incised and carried up to the hepatic flexure and the hepatic attachments were taken down with cautery.  Care was taken to protect the duodenum.  The colon was swept down from the hepatic flexure. The peritoneal attachments at the pelvic brim tethering the ileum down were taken with electrocautery. The ureter was identified and protected.  The right colon was easily brought out into the field.  The proximal and distal points of transection were identified, taking care to preserve the middle colic artery.  A Ligasure was used to take the mesentery as close to the base of the ileocolic vessel as possible to aid with lymph node harvesting.    The colon and small bowel were placed in alignment and enterotomies were made at the antimesenteric borders and a 75 mm linear cutting stapler was used create the colon channel. Th staple line was hemostatic.  The common enterotomy was closed with a TA 60 mm stapler.  Care was taken to oversew the staple line with Lembert 3-0 Silk suture. A crotch stitch was placed at the end of the side to side anastomosis.  Omentum attached to the transverse colon was used to cover the anastomosis.    The abdomen was irrigated. The entire team changed gowns and gloves and new equipment was used for closing.  Final inspection revealed acceptable hemostasis. 0 PDS suture was used to close the fascia. Staples were used to close the skin and a sterile honeycomb dressing was applied.   Dr. Arnoldo Morale was assisting throughout the procedure and was present for the critical portions of the case.   All counts were correct at the end of the case. The patient  was awakened from anesthesia and extubated without complication.  The patient went to the PACU in stable condition.   Curlene Labrum, MD [facility] [address] [place], [place] [address] [phone] (office)

## 2020-02-04 NOTE — Anesthesia Procedure Notes (Signed)
Procedure Name: Intubation Date/Time: 02/04/2020 10:11 AM Performed by: Jonna Munro, CRNA Pre-anesthesia Checklist: Patient identified, Emergency Drugs available, Suction available, Patient being monitored and Timeout performed Patient Re-evaluated:Patient Re-evaluated prior to induction Oxygen Delivery Method: Circle system utilized Preoxygenation: Pre-oxygenation with 100% oxygen Induction Type: IV induction and Rapid sequence Laryngoscope Size: Mac and 3 Grade View: Grade I Tube type: Oral Tube size: 7.0 mm Number of attempts: 1 Airway Equipment and Method: Stylet Placement Confirmation: ETT inserted through vocal cords under direct vision,  positive ETCO2 and breath sounds checked- equal and bilateral Secured at: 23 cm Tube secured with: Tape Dental Injury: Teeth and Oropharynx as per pre-operative assessment

## 2020-02-04 NOTE — Anesthesia Postprocedure Evaluation (Signed)
Anesthesia Post Note  Patient: Tony Holt  Procedure(s) Performed: RIGHT HEMI-COLECTOMY (Right Abdomen)  Patient location during evaluation: PACU Anesthesia Type: General Level of consciousness: awake and alert, patient cooperative and oriented Pain management: pain level controlled Vital Signs Assessment: post-procedure vital signs reviewed and stable Respiratory status: spontaneous breathing, respiratory function stable, nonlabored ventilation and patient connected to face mask oxygen Cardiovascular status: stable Postop Assessment: no apparent nausea or vomiting Anesthetic complications: no     Last Vitals:  Vitals:   02/04/20 0907  BP: 135/73  Pulse: 87  Resp: 17  Temp: 36.7 C  SpO2: 100%    Last Pain:  Vitals:   02/04/20 0907  TempSrc: Oral  PainSc: 0-No pain                 Tena Linebaugh

## 2020-02-04 NOTE — Interval H&P Note (Signed)
History and Physical Interval Note:  02/04/2020 9:20 AM  Tony Holt  has presented today for surgery, with the diagnosis of COLON CANCER.  The various methods of treatment have been discussed with the patient and family. After consideration of risks, benefits and other options for treatment, the patient has consented to  Procedure(s): PARTIAL COLECTOMY (Right) as a surgical intervention.  The patient's history has been reviewed, patient examined, no change in status, stable for surgery.  I have reviewed the patient's chart and labs.  Questions were answered to the patient's satisfaction.    Low risk stress test. Plan to remove cancer and do open procedure to limit anesthesia time given age. PSA high and imaging consistent with prostate cancer. Urology notified and will see patient as an outpatient. Bowel prep completed and stool running clear.   Virl Cagey

## 2020-02-04 NOTE — Progress Notes (Signed)
[facility]  Notified [name] patient done with surgery. Cancer removed. Clears today but go slow. Abdominal binder. PRN for pain. Foley in place for now. Flomax ordered.   Curlene Labrum, MD [facility] [address] [place], [place] [address] [phone] (office)

## 2020-02-04 NOTE — Anesthesia Preprocedure Evaluation (Signed)
Anesthesia Evaluation  Patient identified by MRN, date of birth, ID band Patient awake    Reviewed: Allergy & Precautions, NPO status , Patient's Chart, lab work & pertinent test results  Airway Mallampati: II  TM Distance: >3 FB Neck ROM: Full    Dental no notable dental hx. (+) Edentulous Upper, Edentulous Lower   Pulmonary neg pulmonary ROS, former smoker,    Pulmonary exam normal breath sounds clear to auscultation       Cardiovascular Exercise Tolerance: Poor hypertension, Pt. on medications Normal cardiovascular exam+ dysrhythmias Atrial Fibrillation II Rhythm:Irregular Rate:Normal  Frail appearance recent Echo after new onset Afib - OK- cleared by cardiology - decreased ET Denies DCCV   Neuro/Psych negative neurological ROS  negative psych ROS   GI/Hepatic Neg liver ROS, GERD  Medicated and Controlled,  Endo/Other  negative endocrine ROS  Renal/GU Renal InsufficiencyRenal disease  negative genitourinary   Musculoskeletal negative musculoskeletal ROS (+)   Abdominal   Peds negative pediatric ROS (+)  Hematology negative hematology ROS (+) anemia ,   Anesthesia Other Findings   Reproductive/Obstetrics negative OB ROS                             Anesthesia Physical Anesthesia Plan  ASA: IV  Anesthesia Plan: General   Post-op Pain Management:    Induction: Intravenous  PONV Risk Score and Plan: 2 and Ondansetron and Dexamethasone  Airway Management Planned: Oral ETT  Additional Equipment:   Intra-op Plan:   Post-operative Plan: Extubation in OR  Informed Consent: I have reviewed the patients History and Physical, chart, labs and discussed the procedure including the risks, benefits and alternatives for the proposed anesthesia with the patient or authorized representative who has indicated his/her understanding and acceptance.     Dental advisory given  Plan  Discussed with: CRNA  Anesthesia Plan Comments: (Plan Full PPE use Plan GETA D/W PT -WTP with same after Q&A  D/w pt possibility of postop ventilation -voiced understanding -WTP)        Anesthesia Quick Evaluation

## 2020-02-04 NOTE — Transfer of Care (Signed)
Immediate Anesthesia Transfer of Care Note  Patient: Tony Holt  Procedure(s) Performed: RIGHT HEMI-COLECTOMY (Right Abdomen)  Patient Location: PACU  Anesthesia Type:General  Level of Consciousness: awake, alert , oriented and patient cooperative  Airway & Oxygen Therapy: Patient Spontanous Breathing and Patient connected to face mask  Post-op Assessment: Report given to RN and Post -op Vital signs reviewed and stable  Post vital signs: Reviewed and stable  Last Vitals:  Vitals Value Taken Time  BP    Temp    Pulse    Resp    SpO2      Last Pain:  Vitals:   02/04/20 0907  TempSrc: Oral  PainSc: 0-No pain      Patients Stated Pain Goal: 5 ([id number])  Complications: No apparent anesthesia complications

## 2020-02-05 LAB — CBC WITH DIFFERENTIAL/PLATELET
Abs Immature Granulocytes: 0.02 10*3/uL (ref 0.00–0.07)
Basophils Absolute: 0 10*3/uL (ref 0.0–0.1)
Basophils Relative: 0 %
Eosinophils Absolute: 0 10*3/uL (ref 0.0–0.5)
Eosinophils Relative: 0 %
HCT: 25.9 % — ABNORMAL LOW (ref 39.0–52.0)
Hemoglobin: 7.6 g/dL — ABNORMAL LOW (ref 13.0–17.0)
Immature Granulocytes: 0 %
Lymphocytes Relative: 9 %
Lymphs Abs: 0.7 10*3/uL (ref 0.7–4.0)
MCH: 25.1 pg — ABNORMAL LOW (ref 26.0–34.0)
MCHC: 29.3 g/dL — ABNORMAL LOW (ref 30.0–36.0)
MCV: 85.5 fL (ref 80.0–100.0)
Monocytes Absolute: 0.8 10*3/uL (ref 0.1–1.0)
Monocytes Relative: 11 %
Neutro Abs: 5.9 10*3/uL (ref 1.7–7.7)
Neutrophils Relative %: 80 %
Platelets: 197 10*3/uL (ref 150–400)
RBC: 3.03 MIL/uL — ABNORMAL LOW (ref 4.22–5.81)
RDW: 15.2 % (ref 11.5–15.5)
WBC: 7.4 10*3/uL (ref 4.0–10.5)
nRBC: 0 % (ref 0.0–0.2)

## 2020-02-05 LAB — BASIC METABOLIC PANEL
Anion gap: 8 (ref 5–15)
BUN: 20 mg/dL (ref 8–23)
CO2: 21 mmol/L — ABNORMAL LOW (ref 22–32)
Calcium: 8.6 mg/dL — ABNORMAL LOW (ref 8.9–10.3)
Chloride: 109 mmol/L (ref 98–111)
Creatinine, Ser: 1.88 mg/dL — ABNORMAL HIGH (ref 0.61–1.24)
GFR calc Af Amer: 35 mL/min — ABNORMAL LOW (ref >60)
GFR calc non Af Amer: 31 mL/min — ABNORMAL LOW (ref >60)
Glucose, Bld: 96 mg/dL (ref 70–99)
Potassium: 4.8 mmol/L (ref 3.5–5.1)
Sodium: 138 mmol/L (ref 135–145)

## 2020-02-05 LAB — PHOSPHORUS: Phosphorus: 3.5 mg/dL (ref 2.5–4.6)

## 2020-02-05 LAB — MAGNESIUM: Magnesium: 1.7 mg/dL (ref 1.7–2.4)

## 2020-02-05 MED ORDER — CHLORHEXIDINE GLUCONATE CLOTH 2 % EX PADS
6.0000 | MEDICATED_PAD | Freq: Every day | CUTANEOUS | Status: DC
  Administered 2020-02-05: 08:00:00 6 via TOPICAL

## 2020-02-05 MED ORDER — MAGNESIUM SULFATE 2 GM/50ML IV SOLN
2.0000 g | Freq: Once | INTRAVENOUS | Status: AC
  Administered 2020-02-05: 15:00:00 2 g via INTRAVENOUS
  Filled 2020-02-05: qty 50

## 2020-02-05 NOTE — Progress Notes (Signed)
[facility] Progress Note  1 Day Post-Op  Subjective: Doing fair. Says he cannot really understand me but he hears me. Says he has a BM and walked to the bathroom. Has tolerated some clears.   Objective: Vital signs in last 24 hours: Temp:  [97.5 F (36.4 C)-98.5 F (36.9 C)] 98.4 F (36.9 C) (02/23 0627) Pulse Rate:  [68-92] 75 (02/23 1354) Resp:  [16-20] 20 (02/23 1354) BP: (104-123)/(64-73) 123/69 (02/23 1354) SpO2:  [89 %-100 %] 89 % (02/23 1354) Last BM Date: 02/05/20  Intake/Output from previous day: 02/22 0701 - 02/23 0700 In: 1847 [I.V.:1648.1; IV Piggyback:198.9] Out: 845 [Urine:825; Blood:20] Intake/Output this shift: Total I/O In: 200 [P.O.:200] Out: 450 [Urine:450]  General appearance: alert, cooperative and no distress Resp: normal work of breathing, some cough GI: soft, nondistened, appropriately tender, honeycomb dressing with minor staining, abdominal binder in place Extremities: extremities normal, atraumatic, no cyanosis or edema  Lab Results:  Recent Labs    02/05/20 0454  WBC 7.4  HGB 7.6*  HCT 25.9*  PLT 197   BMET Recent Labs    02/05/20 0454  NA 138  K 4.8  CL 109  CO2 21*  GLUCOSE 96  BUN 20  CREATININE 1.88*  CALCIUM 8.6*    Anti-infectives: Anti-infectives (From admission, onward)   Start     Dose/Rate Route Frequency Ordered Stop   02/04/20 2200  cefoTEtan (CEFOTAN) 2 g in sodium chloride 0.9 % 100 mL IVPB     2 g 200 mL/hr over 30 Minutes Intravenous Every 12 hours 02/04/20 1302 02/07/20 0959   02/04/20 0900  cefoTEtan (CEFOTAN) 2 g in sodium chloride 0.9 % 100 mL IVPB     2 g 200 mL/hr over 30 Minutes Intravenous On call to O.R. 02/04/20 0809 02/04/20 1031      Assessment/Plan: Tony Holt is a very sweet 86 yo POD 1 s/o open right hemicolectomy for cecal cancer. Doing well. Tylenol for pain and PRN narcotics IS, OOB PT ordered to help with mobility HD ok, continue monitor  Given A fib history and  fluid shifts Clears for now, entereg, may be able to up tomorrow if more than just a small BM LR @ 50, urine clear, Cr up slightly, keep foley today and Mg replaced H&H drifting, no signs of bleeding, monitor Cefotetan post op, no leukocytosis SCDs, heparin sq  Likely home at the end of the week pending does fair with PT   LOS: 1 day   Updated Tony Holt his daughter.   Virl Cagey 02/05/2020

## 2020-02-05 NOTE — Evaluation (Signed)
Physical Therapy Evaluation Patient Details Name: Tony Holt MRN: [id number] DOB: 12-Dec-1926 Today's Date: 02/05/2020   History of Present Illness  Tony Holt is a 86 y/o male, s/p  Right hemicolectomy on 02/04/20, with the diagnosis of COLON CANCER.  Clinical Impression  Patient demonstrates good return for sitting up at bedside with c/o minor discomfort in abdomen while wearing binder, slightly labored cadence during ambulation without loss of balance, limited mostly due to c/o fatigue and tolerated sitting up in chair after therapy.  Patient will benefit from continued physical therapy in hospital and recommended venue below to increase strength, balance, endurance for safe ADLs and gait.    Follow Up Recommendations Home health PT;Supervision - Intermittent    Equipment Recommendations  Rolling walker with 5" wheels    Recommendations for Other Services       Precautions / Restrictions Precautions Precautions: Fall Restrictions Weight Bearing Restrictions: No      Mobility  Bed Mobility Overal bed mobility: Needs Assistance Bed Mobility: Supine to Sit     Supine to sit: Supervision     General bed mobility comments: increased time, labored movement  Transfers Overall transfer level: Needs assistance Equipment used: Rolling walker (2 wheeled) Transfers: Sit to/from [employer] Transfers Sit to Stand: Supervision;Min guard Stand pivot transfers: Supervision;Min guard       General transfer comment: slightly labored movement  Ambulation/Gait Ambulation/Gait assistance: Supervision;Min guard Gait Distance (Feet): 100 Feet Assistive device: Rolling walker (2 wheeled) Gait Pattern/deviations: Decreased step length - right;Decreased step length - left;Decreased stride length Gait velocity: decreased   General Gait Details: slightly labored cadence without loss balance, patient chose to use RW due to abdominal discomfort, limited secondary to  fatigue  Stairs            Wheelchair Mobility    Modified Rankin (Stroke Patients Only)       Balance Overall balance assessment: Needs assistance Sitting-balance support: Feet supported;No upper extremity supported Sitting balance-Leahy Scale: Good Sitting balance - Comments: seated at EOB   Standing balance support: During functional activity;Bilateral upper extremity supported Standing balance-Leahy Scale: Fair Standing balance comment: using RW                             Pertinent Vitals/Pain Pain Assessment: No/denies pain    Home Living Family/patient expects to be discharged to:: Private residence Living Arrangements: Alone Available Help at Discharge: Family;Available 24 hours/day Type of Home: House Home Access: Ramped entrance     Home Layout: One level Home Equipment: Cane - single point;Shower seat;Bedside commode      Prior Function Level of Independence: Independent with assistive device(s)         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 [facility]     Hand Dominance        Extremity/Trunk Assessment   Upper Extremity Assessment Upper Extremity Assessment: Overall WFL for tasks assessed    Lower Extremity Assessment Lower Extremity Assessment: Generalized weakness    Cervical / Trunk Assessment Cervical / Trunk Assessment: Kyphotic  Communication   Communication: HOH  Cognition Arousal/Alertness: Awake/alert Behavior During Therapy: WFL for tasks assessed/performed Overall Cognitive Status: Within Functional Limits for tasks assessed                                        General Comments  Exercises     Assessment/Plan    PT Assessment Patient needs continued PT services  PT Problem List Decreased strength;Decreased activity tolerance;Decreased balance;Decreased mobility       PT Treatment Interventions Balance training;Gait training;Stair training;Functional  mobility training;Therapeutic activities;Therapeutic exercise;Patient/family education    PT Goals (Current goals can be found in the Care Plan section)  Acute Rehab PT Goals Patient Stated Goal: return home with family to assist PT Goal Formulation: With patient Time For Goal Achievement: 02/09/20 Potential to Achieve Goals: Good    Frequency Min 4X/week   Barriers to discharge        Co-evaluation               AM-PAC PT "6 Clicks" Mobility  Outcome Measure Help needed turning from your back to your side while in a flat bed without using bedrails?: None Help needed moving from lying on your back to sitting on the side of a flat bed without using bedrails?: A Little Help needed moving to and from a bed to a chair (including a wheelchair)?: A Little Help needed standing up from a chair using your arms (e.g., wheelchair or bedside chair)?: A Little Help needed to walk in hospital room?: A Little Help needed climbing 3-5 steps with a railing? : A Lot 6 Click Score: 18    End of Session   Activity Tolerance: Patient tolerated treatment well;Patient limited by fatigue Patient left: in chair;with call bell/phone within reach Nurse Communication: Mobility status PT Visit Diagnosis: Unsteadiness on feet (R26.81);Other abnormalities of gait and mobility (R26.89);Muscle weakness (generalized) (M62.81)    Time: [id number] PT Time Calculation (min) (ACUTE ONLY): 28 min   Charges:   PT Evaluation $PT Eval Moderate Complexity: 1 Mod PT Treatments $Therapeutic Activity: 23-37 mins        3:28 PM, 02/05/20 Lonell Grandchild, MPT Physical Therapist with [facility] [phone] office [phone] mobile phone

## 2020-02-05 NOTE — Plan of Care (Signed)
  Problem: Acute Rehab PT Goals(only PT should resolve) Goal: Pt Will Go Supine/Side To Sit Outcome: Progressing Flowsheets (Taken 02/05/2020 1529) Pt will go Supine/Side to Sit: with modified independence Goal: Patient Will Transfer Sit To/From Stand Outcome: Progressing Flowsheets (Taken 02/05/2020 1529) Patient will transfer sit to/from stand:  with modified independence  with supervision Goal: Pt Will Transfer Bed To Chair/Chair To Bed Outcome: Progressing Flowsheets (Taken 02/05/2020 1529) Pt will Transfer Bed to Chair/Chair to Bed:  with modified independence  with supervision Goal: Pt Will Ambulate Outcome: Progressing Flowsheets (Taken 02/05/2020 1529) Pt will Ambulate:  > 125 feet  with supervision  with rolling walker   3:29 PM, 02/05/20 Lonell Grandchild, MPT Physical Therapist with [facility] [phone] office [phone] mobile phone

## 2020-02-05 NOTE — Plan of Care (Signed)
  Problem: Education: Goal: Knowledge of General Education information will improve Description: Including pain rating scale, medication(s)/side effects and non-pharmacologic comfort measures Outcome: Progressing   Problem: Clinical Measurements: Goal: Ability to maintain clinical measurements within normal limits will improve Outcome: Progressing   Problem: Activity: Goal: Risk for activity intolerance will decrease Outcome: Progressing   Problem: Nutrition: Goal: Adequate nutrition will be maintained Outcome: Progressing   Problem: Pain Managment: Goal: General experience of comfort will improve Outcome: Progressing   Problem: Safety: Goal: Ability to remain free from injury will improve Outcome: Progressing   Problem: Skin Integrity: Goal: Risk for impaired skin integrity will decrease Outcome: Progressing   

## 2020-02-06 LAB — CBC WITH DIFFERENTIAL/PLATELET
Abs Immature Granulocytes: 0.04 10*3/uL (ref 0.00–0.07)
Basophils Absolute: 0 10*3/uL (ref 0.0–0.1)
Basophils Relative: 0 %
Eosinophils Absolute: 0.1 10*3/uL (ref 0.0–0.5)
Eosinophils Relative: 1 %
HCT: 25.6 % — ABNORMAL LOW (ref 39.0–52.0)
Hemoglobin: 7.6 g/dL — ABNORMAL LOW (ref 13.0–17.0)
Immature Granulocytes: 1 %
Lymphocytes Relative: 7 %
Lymphs Abs: 0.6 10*3/uL — ABNORMAL LOW (ref 0.7–4.0)
MCH: 25 pg — ABNORMAL LOW (ref 26.0–34.0)
MCHC: 29.7 g/dL — ABNORMAL LOW (ref 30.0–36.0)
MCV: 84.2 fL (ref 80.0–100.0)
Monocytes Absolute: 0.8 10*3/uL (ref 0.1–1.0)
Monocytes Relative: 9 %
Neutro Abs: 6.6 10*3/uL (ref 1.7–7.7)
Neutrophils Relative %: 82 %
Platelets: 184 10*3/uL (ref 150–400)
RBC: 3.04 MIL/uL — ABNORMAL LOW (ref 4.22–5.81)
RDW: 15.6 % — ABNORMAL HIGH (ref 11.5–15.5)
WBC: 8.2 10*3/uL (ref 4.0–10.5)
nRBC: 0 % (ref 0.0–0.2)

## 2020-02-06 LAB — BASIC METABOLIC PANEL
Anion gap: 7 (ref 5–15)
BUN: 20 mg/dL (ref 8–23)
CO2: 20 mmol/L — ABNORMAL LOW (ref 22–32)
Calcium: 8.6 mg/dL — ABNORMAL LOW (ref 8.9–10.3)
Chloride: 109 mmol/L (ref 98–111)
Creatinine, Ser: 1.64 mg/dL — ABNORMAL HIGH (ref 0.61–1.24)
GFR calc Af Amer: 42 mL/min — ABNORMAL LOW (ref >60)
GFR calc non Af Amer: 36 mL/min — ABNORMAL LOW (ref >60)
Glucose, Bld: 95 mg/dL (ref 70–99)
Potassium: 5.1 mmol/L (ref 3.5–5.1)
Sodium: 136 mmol/L (ref 135–145)

## 2020-02-06 LAB — MAGNESIUM: Magnesium: 2.3 mg/dL (ref 1.7–2.4)

## 2020-02-06 LAB — PHOSPHORUS: Phosphorus: 2.7 mg/dL (ref 2.5–4.6)

## 2020-02-06 MED ORDER — JUVEN PO PACK
1.0000 | PACK | Freq: Two times a day (BID) | ORAL | Status: DC
  Administered 2020-02-06 – 2020-02-07 (×2): 1 via ORAL
  Filled 2020-02-06 (×2): qty 1

## 2020-02-06 MED ORDER — ENSURE ENLIVE PO LIQD
237.0000 mL | Freq: Two times a day (BID) | ORAL | Status: DC
  Administered 2020-02-06 – 2020-02-07 (×2): 237 mL via ORAL

## 2020-02-06 NOTE — Progress Notes (Signed)
Initial Nutrition Assessment  DOCUMENTATION CODES:   Not applicable  INTERVENTION:  Ensure Enlive po BID, each supplement provides 350 kcal and 20 grams of protein  Magic cup BID with meals, each supplement provides 290 kcal and 9 grams of protein  Juven BID, each packet provides 95 calories, 2.5 grams of protein (collagen), and 9.8 grams of carbohydrate (3 grams sugar); also contains 7 grams of L-arginine and L-glutamine, 300 mg vitamin C, 15 mg vitamin E, 1.2 mcg vitamin B-12, 9.5 mg zinc, 200 mg calcium, and 1.5 g  Calcium Beta-hydroxy-Beta-methylbutyrate to support wound healing   NUTRITION DIAGNOSIS:   Increased nutrient needs related to post-op healing as evidenced by estimated needs.   GOAL:   Patient will meet greater than or equal to 90% of their needs    MONITOR:   PO intake, Supplement acceptance, Labs, Weight trends, Skin  REASON FOR ASSESSMENT:   Malnutrition Screening Tool    ASSESSMENT:  86 year old male with history of GERD, hard of hearing, HTN, atrial fibrillation, history of sigmoid colon cancer s/p open colectomy (1999) who presented for right partial colectomy after recent EGD demonstrated a benign rectal polyp and cecal mass.  Patient is s/p right hemicolectomy on 2/22  Patient eating 75-100% x 3 documented meals on full liquid diet. Diet advanced to dysphagia 2 this morning, will continue to monitor for po intake of meals and supplements.  Delightful patient sitting in bedside chair with lunch tray, daughter in room at [place] visit. Patient reports not feeling hungry this afternoon secondary to eating a late breakfast. Patient stated that his daughter brought him a gravy biscuit from [employer] this morning, he ate about half of it because it was dry.   Daughter of patient endorses fatigue and decreased intake over the past couple of months. Patient stated that his daughter was making him drink 2-3 chocolate milkshakes (Ensure) daily and liked them pretty  good. RD encouraged po intake of meals and supplements and educated on increased needs for post op healing. Mild/moderate fat and muscle depletions noted on NFPE. Patient is at increased risk for malnutrition. RD will provide Ensure daily and magic cup with lunch/dinner meals as well as Juven to support wound healing.   I/Os: +1530 ml since admit       +528 ml x 24 hrs UOP: 1200 ml x 24 hrs  Current wt 143.88 lbs UBW 128-133 over the past year per history  Medications reviewed and include: Colace, Gabapentin, Protonix, Cefotan  Labs: BG 95-96 x 24 hrs, Cr 1.64 (H) trending down, Hgb 7.6 (L)  NUTRITION - FOCUSED PHYSICAL EXAM: 2/24 Findings: Moderate fat depletions to orbital, upper arm, and buccal regions; Mild muscle depletions to temple and scapular region; moderate muscle depletion to clavicle, clavicle acromion, and dorsal hand regions.   Diet Order:   Diet Order            DIET DYS 2 Room service appropriate? Yes; Fluid consistency: Thin  Diet effective now              EDUCATION NEEDS:   Education needs have been addressed  Skin:  Skin Assessment: Skin Integrity Issues: Skin Integrity Issues:: Incisions Incisions: closed; abdomen  Last BM:  2/24  Height:   Ht Readings from Last 1 Encounters:  02/04/20 5\' 8"  (1.727 m)    Weight:   Wt Readings from Last 1 Encounters:  02/04/20 65.4 kg    Ideal Body Weight:  70 kg  BMI:  Body mass  index is 21.92 kg/m.  Estimated Nutritional Needs:   Kcal:  1900-2100  Protein:  95-105  Fluid:  >/= 1.6 L/day    [name], RD, LDN Clinical Nutrition Office [phone] After Hours/Weekend Pager # in [facility]

## 2020-02-06 NOTE — Plan of Care (Signed)
  Problem: Education: Goal: Knowledge of General Education information will improve Description: Including pain rating scale, medication(s)/side effects and non-pharmacologic comfort measures Outcome: Progressing   Problem: Clinical Measurements: Goal: Ability to maintain clinical measurements within normal limits will improve Outcome: Progressing   Problem: Activity: Goal: Risk for activity intolerance will decrease Outcome: Progressing   Problem: Nutrition: Goal: Adequate nutrition will be maintained Outcome: Progressing   Problem: Elimination: Goal: Will not experience complications related to bowel motility Outcome: Progressing   Problem: Safety: Goal: Ability to remain free from injury will improve Outcome: Progressing   Problem: Skin Integrity: Goal: Risk for impaired skin integrity will decrease Outcome: Progressing   

## 2020-02-06 NOTE — Progress Notes (Addendum)
[facility] Progress Note  2 Days Post-Op  Subjective: Doing well. Having Bms. Wants to go home.  Tolerating diet. Foley out and patient says he has voided some.   Objective: Vital signs in last 24 hours: Temp:  [97.7 F (36.5 C)] 97.7 F (36.5 C) (02/23 2010) Pulse Rate:  [75-88] 88 (02/24 0516) Resp:  [16-20] 16 (02/24 0516) BP: (111-123)/(67-76) 123/76 (02/24 0516) SpO2:  [89 %-99 %] 98 % (02/24 0516) Last BM Date: 02/06/20  Intake/Output from previous day: 02/23 0701 - 02/24 0700 In: 1728.1 [P.O.:200; I.V.:1278; IV Piggyback:250.1] Out: 1200 [Urine:1200] Intake/Output this shift: Total I/O In: 240 [P.O.:240] Out: 300 [Urine:300]  General appearance: alert, cooperative and no distress Resp: normal work of breathing GI: soft, nondistended, appropriately tender, staples c/d/i with minimal staining on honeycomb  Lab Results:  Recent Labs    02/05/20 0454 02/06/20 0455  WBC 7.4 8.2  HGB 7.6* 7.6*  HCT 25.9* 25.6*  PLT 197 184   BMET Recent Labs    02/05/20 0454 02/06/20 0455  NA 138 136  K 4.8 5.1  CL 109 109  CO2 21* 20*  GLUCOSE 96 95  BUN 20 20  CREATININE 1.88* 1.64*  CALCIUM 8.6* 8.6*    Anti-infectives: Anti-infectives (From admission, onward)   Start     Dose/Rate Route Frequency Ordered Stop   02/04/20 2200  cefoTEtan (CEFOTAN) 2 g in sodium chloride 0.9 % 100 mL IVPB     2 g 200 mL/hr over 30 Minutes Intravenous Every 12 hours 02/04/20 1302 02/07/20 0959   02/04/20 0900  cefoTEtan (CEFOTAN) 2 g in sodium chloride 0.9 % 100 mL IVPB     2 g 200 mL/hr over 30 Minutes Intravenous On call to O.R. 02/04/20 0809 02/04/20 1031      Assessment/Plan: Mr. Wise is a very sweet 86 yo POD 2  s/o open right hemicolectomy for cecal cancer.  Tylenol for pain and PRN narcotics, not using much  IS, OOB PT recs home PT A fib but rate controlled on his own, off monitor Dysphagia diet due to no dentures, BMs Urine clear, Cr down, foley  out Chronic iron deficiency anemia related to blood loose from his cancer, H&H stable Cefotetan post op, no leukocytosis SCDs, heparin sq  Likely home tomorrow with home PT   LOS: 2 days    Virl Cagey 02/06/2020

## 2020-02-06 NOTE — Progress Notes (Signed)
Physical Therapy Treatment Patient Details Name: Tony Holt MRN: [id number] DOB: [birth year] Today's Date: 02/06/2020    History of Present Illness Tony Holt is a 86 y/o male, s/p  Right hemicolectomy on 02/04/20, with the diagnosis of COLON CANCER.    PT Comments    Pt sitting in chair and willing to participate with therapy.  Min guard during gait, slow labored movements with use of RW.  No LOB, slow cadence and wide turns noted to assist with balance.  Upon return to room pt c/o mild SOB, O2 saturation at 98% room air.  Pt left in chair with call bell within reach and RN aware of status.     Follow Up Recommendations  Home health PT;Supervision - Intermittent     Equipment Recommendations  Rolling walker with 5" wheels    Recommendations for Other Services       Precautions / Restrictions Precautions Precautions: Fall Restrictions Weight Bearing Restrictions: No    Mobility  Bed Mobility               General bed mobility comments: pt sitting on chair upon entrance  Transfers Overall transfer level: Needs assistance Equipment used: Rolling walker (2 wheeled) Transfers: Sit to/from Stand           General transfer comment: slightly labored movement  Ambulation/Gait Ambulation/Gait assistance: Supervision;Min guard Gait Distance (Feet): 120 Feet Assistive device: Rolling walker (2 wheeled) Gait Pattern/deviations: Decreased step length - right;Decreased step length - left;Decreased stride length Gait velocity: decreased   General Gait Details: slightly labored cadence without loss balance, patient chose to use RW due to abdominal discomfort, limited secondary to fatigue   Stairs             Wheelchair Mobility    Modified Rankin (Stroke Patients Only)       Balance                                            Cognition Arousal/Alertness: Awake/alert Behavior During Therapy: WFL for tasks  assessed/performed Overall Cognitive Status: Within Functional Limits for tasks assessed                                        Exercises      General Comments        Pertinent Vitals/Pain Pain Assessment: No/denies pain    Home Living                      Prior Function            PT Goals (current goals can now be found in the care plan section)      Frequency    Min 4X/week      PT Plan      Co-evaluation              AM-PAC PT "6 Clicks" Mobility   Outcome Measure  Help needed turning from your back to your side while in a flat bed without using bedrails?: None Help needed moving from lying on your back to sitting on the side of a flat bed without using bedrails?: A Little Help needed moving to and from a bed to a chair (including a wheelchair)?: A Little Help needed standing up  from a chair using your arms (e.g., wheelchair or bedside chair)?: A Little Help needed to walk in hospital room?: A Little Help needed climbing 3-5 steps with a railing? : A Lot 6 Click Score: 18    End of Session Equipment Utilized During Treatment: Gait belt Activity Tolerance: Patient tolerated treatment well;Patient limited by fatigue Patient left: in chair;with call bell/phone within reach Nurse Communication: Mobility status PT Visit Diagnosis: Unsteadiness on feet (R26.81);Other abnormalities of gait and mobility (R26.89);Muscle weakness (generalized) (M62.81)     Time: [id number] PT Time Calculation (min) (ACUTE ONLY): 25 min  Charges:  $Therapeutic Activity: 23-37 mins                     Ihor Austin, LPTA/CLT; CBIS [phone]  Aldona Lento 02/06/2020, 11:23 AM

## 2020-02-07 LAB — CBC WITH DIFFERENTIAL/PLATELET
Abs Immature Granulocytes: 0.03 10*3/uL (ref 0.00–0.07)
Basophils Absolute: 0 10*3/uL (ref 0.0–0.1)
Basophils Relative: 0 %
Eosinophils Absolute: 0.2 10*3/uL (ref 0.0–0.5)
Eosinophils Relative: 2 %
HCT: 23.5 % — ABNORMAL LOW (ref 39.0–52.0)
Hemoglobin: 7 g/dL — ABNORMAL LOW (ref 13.0–17.0)
Immature Granulocytes: 0 %
Lymphocytes Relative: 8 %
Lymphs Abs: 0.6 10*3/uL — ABNORMAL LOW (ref 0.7–4.0)
MCH: 24.6 pg — ABNORMAL LOW (ref 26.0–34.0)
MCHC: 29.8 g/dL — ABNORMAL LOW (ref 30.0–36.0)
MCV: 82.5 fL (ref 80.0–100.0)
Monocytes Absolute: 0.7 10*3/uL (ref 0.1–1.0)
Monocytes Relative: 9 %
Neutro Abs: 6.2 10*3/uL (ref 1.7–7.7)
Neutrophils Relative %: 81 %
Platelets: 201 10*3/uL (ref 150–400)
RBC: 2.85 MIL/uL — ABNORMAL LOW (ref 4.22–5.81)
RDW: 15.7 % — ABNORMAL HIGH (ref 11.5–15.5)
WBC: 7.9 10*3/uL (ref 4.0–10.5)
nRBC: 0 % (ref 0.0–0.2)

## 2020-02-07 LAB — PHOSPHORUS: Phosphorus: 2.7 mg/dL (ref 2.5–4.6)

## 2020-02-07 LAB — BASIC METABOLIC PANEL
Anion gap: 7 (ref 5–15)
BUN: 25 mg/dL — ABNORMAL HIGH (ref 8–23)
CO2: 21 mmol/L — ABNORMAL LOW (ref 22–32)
Calcium: 8.7 mg/dL — ABNORMAL LOW (ref 8.9–10.3)
Chloride: 108 mmol/L (ref 98–111)
Creatinine, Ser: 1.51 mg/dL — ABNORMAL HIGH (ref 0.61–1.24)
GFR calc Af Amer: 46 mL/min — ABNORMAL LOW (ref >60)
GFR calc non Af Amer: 40 mL/min — ABNORMAL LOW (ref >60)
Glucose, Bld: 107 mg/dL — ABNORMAL HIGH (ref 70–99)
Potassium: 4.4 mmol/L (ref 3.5–5.1)
Sodium: 136 mmol/L (ref 135–145)

## 2020-02-07 LAB — MAGNESIUM: Magnesium: 2 mg/dL (ref 1.7–2.4)

## 2020-02-07 MED ORDER — JUVEN PO PACK: 1.0000 | PACK | Freq: Two times a day (BID) | ORAL | 0 refills | Status: AC

## 2020-02-07 MED ORDER — OXYCODONE HCL 5 MG PO TABS: 5.0000 mg | ORAL_TABLET | ORAL | 0 refills | Status: DC | PRN

## 2020-02-07 MED ORDER — ONDANSETRON 4 MG PO TBDP: 4.0000 mg | ORAL_TABLET | Freq: Four times a day (QID) | ORAL | 0 refills | Status: DC | PRN

## 2020-02-07 MED ORDER — ALUM & MAG HYDROXIDE-SIMETH 200-200-20 MG/5ML PO SUSP
30.0000 mL | Freq: Four times a day (QID) | ORAL | Status: DC | PRN
  Administered 2020-02-07: 06:00:00 30 mL via ORAL
  Filled 2020-02-07: qty 30

## 2020-02-07 MED ORDER — DOCUSATE SODIUM 100 MG PO CAPS: 100.0000 mg | ORAL_CAPSULE | Freq: Two times a day (BID) | ORAL | 1 refills | Status: AC | PRN

## 2020-02-07 NOTE — Discharge Summary (Signed)
Physician Discharge Summary  Patient ID: Tony Holt MRN: [id number] DOB/AGE: 12/22/1925 86 y.o.  Admit date: 02/04/2020 Discharge date: 02/07/2020  Admission Diagnoses: Cecal cancer   Discharge Diagnoses:  Active Problems:   Cecal cancer [facility])   Discharged Condition: good  Hospital Course: Mr. Kulzer is a very sweet 86 yo with cecal cancer s/p open right hemicolectomy. He has done well post operatively. He has been having bowel movements and tolerating a diet. He has been up and ambulating. He worked with physical therapy and they recommended home therapy. He has been using minimal roxicodone for pain.  He has had chronic iron deficiency anemia related to his cancer and this has been exacerbated by some post operative blood loss. The patient has been stable hemodynamically without issues. This will improve with time. No active signs of bleeding.  He also had a small degree of post operative acute kidney injury with a rise in the creatinine but this has been trending to his normal baseline of 1.1-1.66.  His last creatinine was within his range of 1.51.  Consults: Physical therapy  Significant Diagnostic Studies: Results for KHYE, DEVILLIERS (MRN [id number]) as of 02/07/2020 16:19  Ref. Range 02/07/2020 04:44  Sodium Latest Ref Range: 135 - 145 mmol/L 136  Potassium Latest Ref Range: 3.5 - 5.1 mmol/L 4.4  Chloride Latest Ref Range: 98 - 111 mmol/L 108  CO2 Latest Ref Range: 22 - 32 mmol/L 21 (L)  Glucose Latest Ref Range: 70 - 99 mg/dL 107 (H)  BUN Latest Ref Range: 8 - 23 mg/dL 25 (H)  Creatinine Latest Ref Range: 0.61 - 1.24 mg/dL 1.51 (H)  Calcium Latest Ref Range: 8.9 - 10.3 mg/dL 8.7 (L)  Anion gap Latest Ref Range: 5 - 15  7  Phosphorus Latest Ref Range: 2.5 - 4.6 mg/dL 2.7  Magnesium Latest Ref Range: 1.7 - 2.4 mg/dL 2.0  GFR, Est Non African American Latest Ref Range: >60 mL/min 40 (L)  GFR, Est African American Latest Ref Range: >60 mL/min 46 (L)  WBC Latest Ref Range: 4.0  - 10.5 K/uL 7.9  RBC Latest Ref Range: 4.22 - 5.81 MIL/uL 2.85 (L)  Hemoglobin Latest Ref Range: 13.0 - 17.0 g/dL 7.0 (L)  HCT Latest Ref Range: 39.0 - 52.0 % 23.5 (L)  MCV Latest Ref Range: 80.0 - 100.0 fL 82.5  MCH Latest Ref Range: 26.0 - 34.0 pg 24.6 (L)  MCHC Latest Ref Range: 30.0 - 36.0 g/dL 29.8 (L)  RDW Latest Ref Range: 11.5 - 15.5 % 15.7 (H)  Platelets Latest Ref Range: 150 - 400 K/uL 201  nRBC Latest Ref Range: 0.0 - 0.2 % 0.0  Neutrophils Latest Units: % 81  Lymphocytes Latest Units: % 8  Monocytes Relative Latest Units: % 9  Eosinophil Latest Units: % 2  Basophil Latest Units: % 0  Immature Granulocytes Latest Units: % 0  NEUT# Latest Ref Range: 1.7 - 7.7 K/uL 6.2  Lymphocyte # Latest Ref Range: 0.7 - 4.0 K/uL 0.6 (L)  Monocyte # Latest Ref Range: 0.1 - 1.0 K/uL 0.7  Eosinophils Absolute Latest Ref Range: 0.0 - 0.5 K/uL 0.2  Basophils Absolute Latest Ref Range: 0.0 - 0.1 K/uL 0.0  Abs Immature Granulocytes Latest Ref Range: 0.00 - 0.07 K/uL 0.03    Treatments: 02/04/2020- Open right hemicolectomy   Discharge Exam: Blood pressure 116/84, pulse 86, temperature 98.6 F (37 C), temperature source Oral, resp. rate 18, height 5\' 8"  (1.727 m), weight 65.4 kg, SpO2 94 %. General  appearance: alert, cooperative and no distress Resp: normal work of breathing GI: soft, minimally distended, appropriately tender, staples c/d/i without erythema or drainage  Extremities: extremities normal, atraumatic, no cyanosis or edema  Disposition: Discharge disposition: 01-Home or Self Care       Discharge Instructions    Call MD for:  difficulty breathing, headache or visual disturbances   Complete by: As directed    Call MD for:  extreme fatigue   Complete by: As directed    Call MD for:  persistant dizziness or light-headedness   Complete by: As directed    Call MD for:  persistant nausea and vomiting   Complete by: As directed    Call MD for:  redness, tenderness, or signs of  infection (pain, swelling, redness, odor or green/yellow discharge around incision site)   Complete by: As directed    Call MD for:  severe uncontrolled pain   Complete by: As directed    Call MD for:  temperature >100.4   Complete by: As directed    Increase activity slowly   Complete by: As directed      Allergies as of 02/07/2020   No Known Allergies     Medication List    TAKE these medications   diphenhydramine-acetaminophen 25-500 MG Tabs tablet Commonly known as: TYLENOL PM Take 2 tablets by mouth at bedtime.   docusate sodium 100 MG capsule Commonly known as: COLACE Take 1 capsule (100 mg total) by mouth 2 (two) times daily as needed for mild constipation.   FLINTSTONES MULTIVITAMIN PO Take 1 tablet by mouth daily.   HYDROcodone-acetaminophen 5-325 MG tablet Commonly known as: NORCO/VICODIN Take 1 tablet by mouth every 12 (twelve) hours as needed for moderate pain.   nutrition supplement (JUVEN) Pack Take 1 packet by mouth 2 (two) times daily between meals.   omeprazole 20 MG capsule Commonly known as: PRILOSEC Take 20 mg by mouth 2 (two) times daily.   ondansetron 4 MG disintegrating tablet Commonly known as: ZOFRAN-ODT Take 1 tablet (4 mg total) by mouth every 6 (six) hours as needed for nausea.   oxyCODONE 5 MG immediate release tablet Commonly known as: Oxy IR/ROXICODONE Take 1 tablet (5 mg total) by mouth every 4 (four) hours as needed for severe pain or breakthrough pain.   tamsulosin 0.4 MG Caps capsule Commonly known as: FLOMAX Take 1 capsule (0.4 mg total) by mouth daily.      Follow-up Information    Virl Cagey, MD Follow up on 02/19/2020.   Specialty: General Surgery Why: Staple removal  Contact information: [address] [place] [facility] [address] [phone]           Signed: Virl Cagey 02/07/2020, 12:28 PM

## 2020-02-07 NOTE — TOC Transition Note (Signed)
Transition of Care [facility]) - CM/SW Discharge Note   Patient Details  Name: Tony Holt MRN: [id number] Date of Birth: [birth year]  Transition of [facility]) CM/SW Contact:  Que Meneely Dimitri Ped, LCSW Phone Number: 02/07/2020, 1:45 PM   Clinical Narrative:   Patient admitted on 02/04/2020 for surgery: right hemi-colectomy. Patient recommended to discharge home with home health PT and a 5 in rolling walker. CSW spoke with patients daughter as patient is very hard of hearing. Patients daughter [name] explains that patient already has a rolling walker ready for use.   Patient home health referral accepted by [facility]. CSW spoke with Octavia Bruckner Justis who explains that staff will likely be out over the weekend to treat. CSW made family aware of this.   No further needs at this time. CSW signing off.   [place] Transitions of Care  Clinical Social Worker  Ph: [phone]    Final next level of care: Home w Home Health Services Barriers to Discharge: No Barriers Identified   Patient Goals and CMS Choice Patient states their goals for this hospitalization and ongoing recovery are:: to return home CMS Medicare.gov Compare Post Acute Care list provided to:: Patient Choice offered to / list presented to : Patient  Discharge Placement                Patient to be transferred to facility by: family Name of family member [id number] Patient and family notified of of transfer: 02/07/20  Discharge Plan and Services                          HH Arranged: PT [facility] Agency: [facility] Date [place]: 02/07/20 Time [place]: [id number] Representative spoke with at [place]: Delsa Bern [place]: [phone]  Social Determinants of Health ([place]) Interventions     Readmission Risk Interventions Readmission Risk Prevention Plan 02/07/2020  Transportation Screening Complete  PCP or Specialist Appt within  3-5 Days Not Complete  Not Complete comments office not accepting voicemails. states they will reopen tomorrow at 10.  [place] or Home Care Consult Complete  Social Work Consult for [place] Planning/Counseling Complete  Palliative Care Screening Not Applicable  Medication Review Press photographer) Complete  Some recent data might be hidden

## 2020-02-07 NOTE — Progress Notes (Signed)
IV removed. D/C instructions reviewed with patient and daughter, verbalized understanding. Patient transported to private vehicle via wheelchair.

## 2020-02-07 NOTE — Discharge Instructions (Signed)
Discharge Open Abdominal Surgery Instructions:  Common Complaints: Pain at the incision site is common. This will improve with time. Take your pain medications as described below. Some nausea is common and poor appetite. The main goal is to stay hydrated the first few days after surgery.   Diet/ Activity: Diet as tolerated. You have started and tolerated a diet in the hospital, and should continue to increase what you are able to eat.   You may not have a large appetite, but it is important to stay hydrated. Drink 64 ounces of water a day. Your appetite will return with time.  Keep a dry dressing in place over your staples daily or as needed. Some minor pink/ blood tinged drainage is expected. This will stop in a few days after surgery.  Shower per your regular routine daily.  Do not take hot showers. Take warm showers that are less than 10 minutes. Pat the incision dry. Wear an abdominal binder daily with activity. You do not have to wear this while sleeping or sitting.  Rest and listen to your body, but do not remain in bed all day.  Walk everyday for at least 15-20 minutes. Deep cough and move around every 1-2 hours in the first few days after surgery.  Do not lift > 10 lbs, perform excessive bending, pushing, pulling, squatting for 6-8 weeks after surgery.  The activity restrictions and the abdominal binder are to prevent hernia formation at your incision while you are healing.  Do not place lotions or balms on your incision unless instructed to specifically by Dr. Constance Haw.   Physical therapy should be coming to your home to work with you. This is arranged with home health.  Supplement your diet with protein/ ensure or boost shakes. The Juven was recommended by nutrition, but this may be expense and not available. Do not pay a lot of money for this product.   Pain Expectations and Narcotics: -After surgery you will have pain associated with your incisions and this is normal. The pain is  muscular and nerve pain, and will get better with time. -You are encouraged and expected to take non narcotic medications like tylenol and ibuprofen (when able) to treat pain as multiple modalities can aid with pain treatment. -Narcotics are only used when pain is severe or there is breakthrough pain. -You are not expected to have a pain score of 0 after surgery, as we cannot prevent pain. A pain score of 3-4 that allows you to be functional, move, walk, and tolerate some activity is the goal. The pain will continue to improve over the days after surgery and is dependent on your surgery. -Due to [place] law, we are only able to give a certain amount of pain medication to treat post operative pain, and we only give additional narcotics on a patient by patient basis.  -For most laparoscopic surgery, studies have shown that the majority of patients only need 10-15 narcotic pills, and for open surgeries most patients only need 15-20.   -Having appropriate expectations of pain and knowledge of pain management with non narcotics is important as we do not want anyone to become addicted to narcotic pain medication.  -Using ice packs in the first 48 hours and heating pads after 48 hours, wearing an abdominal binder (when recommended), and using over the counter medications are all ways to help with pain management.   -Simple acts like meditation and mindfulness practices after surgery can also help with pain control and research has  proven the benefit of these practices.  Medication: Take tylenol and ibuprofen as needed for pain control, alternating every 4-6 hours.  Example:  Tylenol 1000mg  @ 6am, 12noon, 6pm, 46midnight (Do not exceed 4000mg  of tylenol a day). Ibuprofen 800mg  @ 9am, 3pm, 9pm, 3am (Do not exceed 3600mg  of ibuprofen a day).  Take Roxicodone for breakthrough pain every 4 hours.  Do not take the Roxicodone with your Vicodin.  Take Colace for constipation related to narcotic pain medication. If you  do not have a bowel movement in 2 days, take Miralax over the counter.  Drink plenty of water to also prevent constipation.   Contact Information: If you have questions or concerns, please call our office, [phone], Monday- Thursday 8AM-5PM and Friday 8AM-12Noon.  If it is after hours or on the weekend, please call [facility] Main Number, [phone], and ask to speak to the surgeon on call for Dr. Constance Haw at [facility].    Open Colectomy, Care After This sheet gives you information about how to care for yourself after your procedure. Your health care provider may also give you more specific instructions. If you have problems or questions, contact your health care provider. What can I expect after the procedure? After the procedure, it is common to have:  Pain in your abdomen, especially along your incision.  Tiredness. Your energy level will return to normal over the next several weeks.  Constipation.  Nausea.  Difficulty urinating. Follow these instructions at home: Activity  You may be able to return to most of your normal activities within 1-2 weeks, such as working, walking up stairs, and sexual activity.  Avoid activities that require a lot of energy for 4-6 weeks after surgery, such as running, climbing, and lifting heavy objects. Ask your health care provider what activities are safe for you.  Take rest breaks during the day as needed.  Do not drive for 1-2 weeks or until your health care provider says that it is safe.  Do not drive or use heavy machinery while taking prescription pain medicines.  Do not lift anything that is heavier than 10 lb (4.3 kg) until your health care provider says that it is safe. Incision care   Follow instructions from your health care provider about how to take care of your incision. Make sure you: ? Wash your hands with soap and water before you change your bandage (dressing). If soap and water are not available, use hand  sanitizer. ? Change your dressing as told by your health care provider. ? Leave stitches (sutures) or staples in place. These skin closures may need to stay in place for 2 weeks or longer.  Avoid wearing tight clothing around your incision.  Protect your incision area from the sun.  Check your incision area every day for signs of infection. Check for: ? More redness, swelling, or pain. ? More fluid or blood. ? Warmth. ? Pus or a bad smell. General instructions  Do not take baths, swim, or use a hot tub until your health care provider approves.  You may shower.  Take over-the-counter and prescription medicines, including stool softeners, only as told by your health care provider.  Eat a low-fat and low-fiber diet for the first 4 weeks after surgery.  Keep all follow-up visits as told by your health care provider. This is important. Contact a health care provider if:  You have more redness, swelling, or pain around your incision.  You have more fluid or blood coming from your incision.  Your incision feels warm to the touch.  You have pus or a bad smell coming from your incision.  You have a fever or chills.  You do not have a bowel movement 2-3 days after surgery.  You cannot eat or drink for 24 hours or more.  You have persistent nausea and vomiting.  You have abdominal pain that gets worse and does not get better with medicine. Get help right away if:  You have chest pain.  You have shortness of breath.  You have pain or swelling in your legs.  Your incision breaks open after your sutures or staples have been removed.  You have bleeding from the rectum. This information is not intended to replace advice given to you by your health care provider. Make sure you discuss any questions you have with your health care provider. Document Revised: 11/11/2017 Document Reviewed: 08/30/2016 Elsevier Patient Education  2020 [employer].

## 2020-02-07 NOTE — Care Management Important Message (Signed)
Important Message  Patient Details  Name: Tony Holt MRN: [id number] Date of Birth: 12-Jun-1926   Medicare Important Message Given:  Yes(copy given to patient after speaking with daughter)     [name] 02/07/2020, 10:45 AM

## 2020-02-08 DIAGNOSIS — M545 Low back pain: Secondary | ICD-10-CM | POA: Diagnosis not present

## 2020-02-08 DIAGNOSIS — Z79891 Long term (current) use of opiate analgesic: Secondary | ICD-10-CM | POA: Diagnosis not present

## 2020-02-08 DIAGNOSIS — Z7189 Other specified counseling: Secondary | ICD-10-CM | POA: Diagnosis not present

## 2020-02-08 DIAGNOSIS — C180 Malignant neoplasm of cecum: Secondary | ICD-10-CM | POA: Diagnosis not present

## 2020-02-11 LAB — TYPE AND SCREEN
ABO/RH(D): A POS
Antibody Screen: NEGATIVE
Unit division: 0
Unit division: 0

## 2020-02-11 LAB — BPAM RBC
Blood Product Expiration Date: 202103032359
Blood Product Expiration Date: 202103092359
Unit Type and Rh: 600
Unit Type and Rh: 6200

## 2020-02-14 ENCOUNTER — Encounter (HOSPITAL_COMMUNITY): Payer: Self-pay | Admitting: General Surgery

## 2020-02-14 ENCOUNTER — Telehealth: Payer: Self-pay

## 2020-02-14 NOTE — Telephone Encounter (Signed)
Diarrhea-3 times this am, very watery- fatigue-weakness. No appetite- drinking approximately 32 ounces of fluid daily. No pain.

## 2020-02-14 NOTE — Telephone Encounter (Signed)
Spoke with Dr.Bridges- they have stopped the Colace already- temperature yesterday 99.o, complains of fatigue and weakness.  98.4 today. She wants to Try BRAT diet- bananas and metamucil and applesauce today and if no better she will call office- denied coming to office today.

## 2020-02-15 LAB — SURGICAL PATHOLOGY

## 2020-02-19 ENCOUNTER — Other Ambulatory Visit (HOSPITAL_COMMUNITY)
Admission: RE | Admit: 2020-02-19 | Discharge: 2020-02-19 | Disposition: A | Discharge status: Home / Self Care | Payer: Medicare HMO | Source: Ambulatory Visit | Attending: General Surgery | Admitting: General Surgery

## 2020-02-19 ENCOUNTER — Encounter: Payer: Self-pay | Admitting: General Surgery

## 2020-02-19 ENCOUNTER — Other Ambulatory Visit: Payer: Self-pay

## 2020-02-19 ENCOUNTER — Ambulatory Visit (INDEPENDENT_AMBULATORY_CARE_PROVIDER_SITE_OTHER): Payer: Self-pay | Admitting: General Surgery

## 2020-02-19 VITALS — BP 134/76 | HR 87 | Temp 97.5°F | Resp 12 | Ht 68.0 in | Wt 127.0 lb

## 2020-02-19 DIAGNOSIS — R350 Frequency of micturition: Secondary | ICD-10-CM | POA: Insufficient documentation

## 2020-02-19 DIAGNOSIS — C180 Malignant neoplasm of cecum: Secondary | ICD-10-CM | POA: Insufficient documentation

## 2020-02-19 DIAGNOSIS — D630 Anemia in neoplastic disease: Secondary | ICD-10-CM | POA: Diagnosis not present

## 2020-02-19 DIAGNOSIS — X58XXXA Exposure to other specified factors, initial encounter: Secondary | ICD-10-CM | POA: Diagnosis not present

## 2020-02-19 DIAGNOSIS — T8141XA Infection following a procedure, superficial incisional surgical site, initial encounter: Secondary | ICD-10-CM | POA: Diagnosis not present

## 2020-02-19 DIAGNOSIS — E860 Dehydration: Secondary | ICD-10-CM

## 2020-02-19 LAB — URINALYSIS, ROUTINE W REFLEX MICROSCOPIC
Bacteria, UA: NONE SEEN
Bilirubin Urine: NEGATIVE
Glucose, UA: NEGATIVE mg/dL
Hgb urine dipstick: NEGATIVE
Ketones, ur: NEGATIVE mg/dL
Nitrite: NEGATIVE
Protein, ur: NEGATIVE mg/dL
Specific Gravity, Urine: 1.016 (ref 1.005–1.030)
pH: 6 (ref 5.0–8.0)

## 2020-02-19 LAB — BASIC METABOLIC PANEL
Anion gap: 11 (ref 5–15)
BUN: 28 mg/dL — ABNORMAL HIGH (ref 8–23)
CO2: 21 mmol/L — ABNORMAL LOW (ref 22–32)
Calcium: 9.2 mg/dL (ref 8.9–10.3)
Chloride: 102 mmol/L (ref 98–111)
Creatinine, Ser: 1.95 mg/dL — ABNORMAL HIGH (ref 0.61–1.24)
GFR calc Af Amer: 34 mL/min — ABNORMAL LOW (ref >60)
GFR calc non Af Amer: 29 mL/min — ABNORMAL LOW (ref >60)
Glucose, Bld: 97 mg/dL (ref 70–99)
Potassium: 5.5 mmol/L — ABNORMAL HIGH (ref 3.5–5.1)
Sodium: 134 mmol/L — ABNORMAL LOW (ref 135–145)

## 2020-02-19 LAB — CBC WITH DIFFERENTIAL/PLATELET
Abs Immature Granulocytes: 0.04 10*3/uL (ref 0.00–0.07)
Basophils Absolute: 0.1 10*3/uL (ref 0.0–0.1)
Basophils Relative: 1 %
Eosinophils Absolute: 0.3 10*3/uL (ref 0.0–0.5)
Eosinophils Relative: 3 %
HCT: 30.4 % — ABNORMAL LOW (ref 39.0–52.0)
Hemoglobin: 9 g/dL — ABNORMAL LOW (ref 13.0–17.0)
Immature Granulocytes: 0 %
Lymphocytes Relative: 7 %
Lymphs Abs: 0.7 10*3/uL (ref 0.7–4.0)
MCH: 25.1 pg — ABNORMAL LOW (ref 26.0–34.0)
MCHC: 29.6 g/dL — ABNORMAL LOW (ref 30.0–36.0)
MCV: 84.9 fL (ref 80.0–100.0)
Monocytes Absolute: 0.8 10*3/uL (ref 0.1–1.0)
Monocytes Relative: 7 %
Neutro Abs: 8.7 10*3/uL — ABNORMAL HIGH (ref 1.7–7.7)
Neutrophils Relative %: 82 %
Platelets: 430 10*3/uL — ABNORMAL HIGH (ref 150–400)
RBC: 3.58 MIL/uL — ABNORMAL LOW (ref 4.22–5.81)
RDW: 15.8 % — ABNORMAL HIGH (ref 11.5–15.5)
WBC: 10.7 10*3/uL — ABNORMAL HIGH (ref 4.0–10.5)
nRBC: 0 % (ref 0.0–0.2)

## 2020-02-19 MED ORDER — SULFAMETHOXAZOLE-TRIMETHOPRIM 800-160 MG PO TABS: 1.0000 | ORAL_TABLET | Freq: Two times a day (BID) | ORAL | 0 refills | Status: DC

## 2020-02-19 NOTE — Patient Instructions (Signed)
64 ounces of water a day. Ensure/ boost X 4 a day. Keep walking and moving. Take antibiotics for wound infection.

## 2020-02-19 NOTE — Progress Notes (Signed)
[facility] Note   HPI:  86 y.o. Male presents to clinic for post-op follow-up evaluation after his right hemicolectomy for colon cancer. He has been having some chronic nausea and has a poor appetite. He has not been drinking much and doing about 2 ensures a day. [name] says he refused PT but he has been moving around at home with his walker. He is having darker stools but is having stools daily.   Review of Systems:  Nausea Daily stools, darker  No chest pain or palpitations, no SOB Frequent urination/ clear yellow  All other review of systems: otherwise negative   Vital Signs:  BP 134/76   Pulse 87   Temp (!) 97.5 F (36.4 C) (Oral)   Resp 12   Ht 5' 8"  (1.727 m)   Wt 127 lb (57.6 kg)   SpO2 96%   BMI 19.31 kg/m    Physical Exam:  Physical Exam Vitals reviewed.  HENT:     Head: Normocephalic.     Nose: Nose normal.     Mouth/Throat:     Mouth: Mucous membranes are moist.  Cardiovascular:     Rate and Rhythm: Normal rate.  Pulmonary:     Effort: Pulmonary effort is normal.  Abdominal:     General: There is no distension.     Palpations: Abdomen is soft.     Tenderness: There is no abdominal tenderness.     Hernia: No hernia is present.     Comments: Incisions with staples, upper erythema and minor drainage around staples, staples removed, steri placed, concerned for cellulitis around the wound  Musculoskeletal:        General: No swelling.  Skin:    General: Skin is warm.  Neurological:     General: No focal deficit present.     Mental Status: He is alert.  Psychiatric:        Mood and Affect: Mood normal.        Behavior: Behavior normal.        Thought Content: Thought content normal.        Judgment: Judgment normal.    Pathology: FINAL MICROSCOPIC DIAGNOSIS:   A. COLON, RIGHT, COLECTOMY:  - Invasive moderately differentiated adenocarcinoma, 4.5 cm, involving  ileocecal valve and cecum  - Carcinoma focally invades into the serosal  surface  - Resection margins are negative for carcinoma  - Lymphovascular invasion is not identified  - Lymph nodes, negative for carcinoma (0/19)  - Sessile serrated polyp of appendix without cytologic dysplasia  - Separate tubular adenomas (4)  - See oncology table     ONCOLOGY TABLE:   COLON AND RECTUM: Resection, Including Transanal Disk Excision of  Rectal Neoplasms   Procedure: Right colon, colectomy  Tumor Site: Ileocecal valve and cecum  Tumor Size: 4.5 cm  Macroscopic Tumor Perforation: Not identified  Histologic Type: Adenocarcinoma  Histologic Grade: G2: Moderately differentiated  Tumor Extension: Tumor focally invades into the serosal surface  Margins: Uninvolved by tumor  Treatment Effect: No known presurgical therapy  Lymphovascular Invasion: Not identified  Perineural Invasion: Not identified  Tumor Deposits: Not identified  Regional Lymph Nodes:    Number of Lymph Nodes Involved: 0    Number of Lymph Nodes Examined: 19  Pathologic Stage Classification (pTNM, AJCC 8th Edition): pT4a, pN0  Ancillary Studies: MMR / MSI testing will be ordered.  Representative Tumor Block: A7  Comments: Sessile serrated polyp of appendix without cytologic  dysplasia; Separate tubular adenomas (4)  Laboratory  studies:  Results for SHINICHI, ANGUIANO (MRN [id number]) as of 02/19/2020 14:39  Ref. Range 02/19/2020 12:57  Sodium Latest Ref Range: 135 - 145 mmol/L 134 (L)  Potassium Latest Ref Range: 3.5 - 5.1 mmol/L 5.5 (H)  Chloride Latest Ref Range: 98 - 111 mmol/L 102  CO2 Latest Ref Range: 22 - 32 mmol/L 21 (L)  Glucose Latest Ref Range: 70 - 99 mg/dL 97  BUN Latest Ref Range: 8 - 23 mg/dL 28 (H)  Creatinine Latest Ref Range: 0.61 - 1.24 mg/dL 1.95 (H)  Calcium Latest Ref Range: 8.9 - 10.3 mg/dL 9.2  Anion gap Latest Ref Range: 5 - 15  11  GFR, Est Non African American Latest Ref Range: >60 mL/min 29 (L)  GFR, Est African American Latest Ref Range: >60 mL/min 34 (L)  WBC  Latest Ref Range: 4.0 - 10.5 K/uL 10.7 (H)  RBC Latest Ref Range: 4.22 - 5.81 MIL/uL 3.58 (L)  Hemoglobin Latest Ref Range: 13.0 - 17.0 g/dL 9.0 (L)  HCT Latest Ref Range: 39.0 - 52.0 % 30.4 (L)  MCV Latest Ref Range: 80.0 - 100.0 fL 84.9  MCH Latest Ref Range: 26.0 - 34.0 pg 25.1 (L)  MCHC Latest Ref Range: 30.0 - 36.0 g/dL 29.6 (L)  RDW Latest Ref Range: 11.5 - 15.5 % 15.8 (H)  Platelets Latest Ref Range: 150 - 400 K/uL 430 (H)  nRBC Latest Ref Range: 0.0 - 0.2 % 0.0  Neutrophils Latest Units: % 82  Lymphocytes Latest Units: % 7  Monocytes Relative Latest Units: % 7  Eosinophil Latest Units: % 3  Basophil Latest Units: % 1  Immature Granulocytes Latest Units: % 0  NEUT# Latest Ref Range: 1.7 - 7.7 K/uL 8.7 (H)  Lymphocyte # Latest Ref Range: 0.7 - 4.0 K/uL 0.7  Monocyte # Latest Ref Range: 0.1 - 1.0 K/uL 0.8  Eosinophils Absolute Latest Ref Range: 0.0 - 0.5 K/uL 0.3  Basophils Absolute Latest Ref Range: 0.0 - 0.1 K/uL 0.1  Abs Immature Granulocytes Latest Ref Range: 0.00 - 0.07 K/uL 0.04  URINALYSIS, ROUTINE W REFLEX MICROSCOPIC Unknown Rpt (A)  Appearance Latest Ref Range: CLEAR  HAZY (A)  Bilirubin Urine Latest Ref Range: NEGATIVE  NEGATIVE  Color, Urine Latest Ref Range: YELLOW  YELLOW  Glucose, UA Latest Ref Range: NEGATIVE mg/dL NEGATIVE  Hgb urine dipstick Latest Ref Range: NEGATIVE  NEGATIVE  Ketones, ur Latest Ref Range: NEGATIVE mg/dL NEGATIVE  Leukocytes,Ua Latest Ref Range: NEGATIVE  SMALL (A)  Nitrite Latest Ref Range: NEGATIVE  NEGATIVE  pH Latest Ref Range: 5.0 - 8.0  6.0  Protein Latest Ref Range: NEGATIVE mg/dL NEGATIVE  Specific Gravity, Urine Latest Ref Range: 1.005 - 1.030  1.016  Bacteria, UA Latest Ref Range: NONE SEEN  NONE SEEN  RBC / HPF Latest Ref Range: 0 - 5 RBC/hpf 0-5  Squamous Epithelial / LPF Latest Ref Range: 0 - 5  0-5  WBC, UA Latest Ref Range: 0 - 5 WBC/hpf 0-5     Assessment:  86 y.o. yo Male s/p right hemicolectomy for colon cancer.  He  is doing fair but is somewhat weak, some what dehydrated and has a poor appetite. His midline wound also has some degree of cellulitis.  Labs obtained and listed above after today's visit.   Plan:  64 ounces of water a day, dehydrated on labs from today, encourage intake, and called [name] to notify her of the results  Ensure/ boost X 4 a day, we may have to  add something like Megace if his appetite does not improve  Keep walking and moving. Take antibiotics for wound infection. CBC, BMP, UA and culture ordered and listed above  UA without obvious infection, will get culture, BMP with K elevated likely from dehydration, will monitor, has had elevated Cr in the past, H&H improving and no leukocytosis, will recheck labs next week and warned [name] to go to hospital with worsening dizziness, weakness, palpitations, chest pain, etc   -Needs to be referred to Urology for PSA elevation and prostate mass on CT/ Dr. Alyson Ingles had said he would see him as outpatient to determine if needs cysto -Referral to Oncology so they can discuss options, unlikely patient or daughter will want to pursue chemotherapy but want to know the options   Future Appointments  Date Time Provider [facility]  02/26/2020 11:15 AM Virl Cagey, MD RS-RS None  03/03/2020  1:00 PM Derek Jack, MD AP-ACAPA None  04/07/2020  2:00 PM McKenzie, Candee Furbish, MD AUR-AUR None    All of the above recommendations were discussed with the patient and patient's family, and all of patient's and family's questions were answered to their expressed satisfaction.  Curlene Labrum, MD [facility] [address] [place], [place] [address] [phone] (office)

## 2020-02-21 ENCOUNTER — Telehealth: Payer: Self-pay | Admitting: General Surgery

## 2020-02-21 LAB — URINE CULTURE: Culture: 80000 — AB

## 2020-02-21 MED ORDER — SULFAMETHOXAZOLE-TRIMETHOPRIM 800-160 MG PO TABS: 1.0000 | ORAL_TABLET | Freq: Two times a day (BID) | ORAL | 0 refills | Status: DC

## 2020-02-21 NOTE — Telephone Encounter (Signed)
[facility]  He is doing much better Tony Holt says he is feeling better overall.  His urine just came back with MRSA that is sensitive to Bactrim which I put him on for the cellulitis around the upper incision.  He is drinking more water and drinking more ensure.  Overall things are improving.   Appetite is getting better.   Will plan to repeat labs and urine to make sure things improving.   Curlene Labrum, MD [facility] [address] [place], [place] [address] [phone] (office)

## 2020-02-22 ENCOUNTER — Other Ambulatory Visit: Payer: Self-pay

## 2020-02-22 ENCOUNTER — Inpatient Hospital Stay (HOSPITAL_COMMUNITY)
Admission: EM | Admit: 2020-02-22 | Discharge: 2020-02-25 | DRG: 683 | Disposition: A | Discharge status: Home / Self Care | Payer: Medicare HMO | Attending: Internal Medicine | Admitting: Internal Medicine

## 2020-02-22 ENCOUNTER — Encounter (HOSPITAL_COMMUNITY): Payer: Self-pay

## 2020-02-22 ENCOUNTER — Emergency Department (HOSPITAL_COMMUNITY): Payer: Medicare HMO

## 2020-02-22 DIAGNOSIS — C61 Malignant neoplasm of prostate: Secondary | ICD-10-CM | POA: Diagnosis not present

## 2020-02-22 DIAGNOSIS — E872 Acidosis: Secondary | ICD-10-CM | POA: Diagnosis present

## 2020-02-22 DIAGNOSIS — C180 Malignant neoplasm of cecum: Secondary | ICD-10-CM | POA: Diagnosis not present

## 2020-02-22 DIAGNOSIS — H9190 Unspecified hearing loss, unspecified ear: Secondary | ICD-10-CM | POA: Diagnosis present

## 2020-02-22 DIAGNOSIS — I482 Chronic atrial fibrillation, unspecified: Secondary | ICD-10-CM | POA: Diagnosis not present

## 2020-02-22 DIAGNOSIS — I129 Hypertensive chronic kidney disease with stage 1 through stage 4 chronic kidney disease, or unspecified chronic kidney disease: Secondary | ICD-10-CM | POA: Diagnosis not present

## 2020-02-22 DIAGNOSIS — Z9049 Acquired absence of other specified parts of digestive tract: Secondary | ICD-10-CM

## 2020-02-22 DIAGNOSIS — Z79891 Long term (current) use of opiate analgesic: Secondary | ICD-10-CM | POA: Diagnosis not present

## 2020-02-22 DIAGNOSIS — D509 Iron deficiency anemia, unspecified: Secondary | ICD-10-CM | POA: Diagnosis present

## 2020-02-22 DIAGNOSIS — Z8249 Family history of ischemic heart disease and other diseases of the circulatory system: Secondary | ICD-10-CM

## 2020-02-22 DIAGNOSIS — L0390 Cellulitis, unspecified: Secondary | ICD-10-CM | POA: Diagnosis present

## 2020-02-22 DIAGNOSIS — Z79899 Other long term (current) drug therapy: Secondary | ICD-10-CM | POA: Diagnosis not present

## 2020-02-22 DIAGNOSIS — E875 Hyperkalemia: Secondary | ICD-10-CM | POA: Diagnosis present

## 2020-02-22 DIAGNOSIS — N179 Acute kidney failure, unspecified: Principal | ICD-10-CM | POA: Diagnosis present

## 2020-02-22 DIAGNOSIS — Z87891 Personal history of nicotine dependence: Secondary | ICD-10-CM | POA: Diagnosis not present

## 2020-02-22 DIAGNOSIS — N1832 Chronic kidney disease, stage 3b: Secondary | ICD-10-CM | POA: Diagnosis not present

## 2020-02-22 DIAGNOSIS — K219 Gastro-esophageal reflux disease without esophagitis: Secondary | ICD-10-CM | POA: Diagnosis present

## 2020-02-22 DIAGNOSIS — Z85038 Personal history of other malignant neoplasm of large intestine: Secondary | ICD-10-CM

## 2020-02-22 DIAGNOSIS — N189 Chronic kidney disease, unspecified: Secondary | ICD-10-CM | POA: Diagnosis present

## 2020-02-22 DIAGNOSIS — I10 Essential (primary) hypertension: Secondary | ICD-10-CM | POA: Diagnosis present

## 2020-02-22 DIAGNOSIS — N170 Acute kidney failure with tubular necrosis: Secondary | ICD-10-CM

## 2020-02-22 DIAGNOSIS — Z20822 Contact with and (suspected) exposure to covid-19: Secondary | ICD-10-CM | POA: Diagnosis present

## 2020-02-22 DIAGNOSIS — R531 Weakness: Secondary | ICD-10-CM | POA: Diagnosis not present

## 2020-02-22 DIAGNOSIS — T887XXA Unspecified adverse effect of drug or medicament, initial encounter: Secondary | ICD-10-CM

## 2020-02-22 DIAGNOSIS — E860 Dehydration: Secondary | ICD-10-CM | POA: Diagnosis present

## 2020-02-22 DIAGNOSIS — Z8546 Personal history of malignant neoplasm of prostate: Secondary | ICD-10-CM

## 2020-02-22 DIAGNOSIS — D500 Iron deficiency anemia secondary to blood loss (chronic): Secondary | ICD-10-CM | POA: Diagnosis present

## 2020-02-22 LAB — COMPREHENSIVE METABOLIC PANEL
ALT: 14 U/L (ref 0–44)
AST: 17 U/L (ref 15–41)
Albumin: 3.8 g/dL (ref 3.5–5.0)
Alkaline Phosphatase: 65 U/L (ref 38–126)
Anion gap: 9 (ref 5–15)
BUN: 41 mg/dL — ABNORMAL HIGH (ref 8–23)
CO2: 21 mmol/L — ABNORMAL LOW (ref 22–32)
Calcium: 9.1 mg/dL (ref 8.9–10.3)
Chloride: 104 mmol/L (ref 98–111)
Creatinine, Ser: 2.9 mg/dL — ABNORMAL HIGH (ref 0.61–1.24)
GFR calc Af Amer: 21 mL/min — ABNORMAL LOW (ref >60)
GFR calc non Af Amer: 18 mL/min — ABNORMAL LOW (ref >60)
Glucose, Bld: 134 mg/dL — ABNORMAL HIGH (ref 70–99)
Potassium: 6.9 mmol/L (ref 3.5–5.1)
Sodium: 134 mmol/L — ABNORMAL LOW (ref 135–145)
Total Bilirubin: 0.4 mg/dL (ref 0.3–1.2)
Total Protein: 7 g/dL (ref 6.5–8.1)

## 2020-02-22 LAB — CBC WITH DIFFERENTIAL/PLATELET
Abs Immature Granulocytes: 0.04 10*3/uL (ref 0.00–0.07)
Basophils Absolute: 0 10*3/uL (ref 0.0–0.1)
Basophils Relative: 0 %
Eosinophils Absolute: 0 10*3/uL (ref 0.0–0.5)
Eosinophils Relative: 0 %
HCT: 26.8 % — ABNORMAL LOW (ref 39.0–52.0)
Hemoglobin: 8.3 g/dL — ABNORMAL LOW (ref 13.0–17.0)
Immature Granulocytes: 1 %
Lymphocytes Relative: 7 %
Lymphs Abs: 0.6 10*3/uL — ABNORMAL LOW (ref 0.7–4.0)
MCH: 25.1 pg — ABNORMAL LOW (ref 26.0–34.0)
MCHC: 31 g/dL (ref 30.0–36.0)
MCV: 81 fL (ref 80.0–100.0)
Monocytes Absolute: 0.4 10*3/uL (ref 0.1–1.0)
Monocytes Relative: 5 %
Neutro Abs: 7.5 10*3/uL (ref 1.7–7.7)
Neutrophils Relative %: 87 %
Platelets: 417 10*3/uL — ABNORMAL HIGH (ref 150–400)
RBC: 3.31 MIL/uL — ABNORMAL LOW (ref 4.22–5.81)
RDW: 15.5 % (ref 11.5–15.5)
WBC: 8.6 10*3/uL (ref 4.0–10.5)
nRBC: 0 % (ref 0.0–0.2)

## 2020-02-22 LAB — URINALYSIS, ROUTINE W REFLEX MICROSCOPIC
Bilirubin Urine: NEGATIVE
Glucose, UA: NEGATIVE mg/dL
Hgb urine dipstick: NEGATIVE
Ketones, ur: NEGATIVE mg/dL
Leukocytes,Ua: NEGATIVE
Nitrite: NEGATIVE
Protein, ur: NEGATIVE mg/dL
Specific Gravity, Urine: 1.008 (ref 1.005–1.030)
pH: 6 (ref 5.0–8.0)

## 2020-02-22 LAB — CREATININE, URINE, RANDOM: Creatinine, Urine: 44.68 mg/dL

## 2020-02-22 LAB — SODIUM, URINE, RANDOM: Sodium, Ur: 58 mmol/L

## 2020-02-22 LAB — POTASSIUM: Potassium: 5.4 mmol/L — ABNORMAL HIGH (ref 3.5–5.1)

## 2020-02-22 MED ORDER — PANTOPRAZOLE SODIUM 40 MG PO TBEC
40.0000 mg | DELAYED_RELEASE_TABLET | Freq: Every day | ORAL | Status: DC
  Administered 2020-02-22 – 2020-02-24 (×3): 40 mg via ORAL
  Filled 2020-02-22 (×4): qty 1

## 2020-02-22 MED ORDER — DIPHENHYDRAMINE HCL 25 MG PO CAPS: 25.0000 mg | ORAL_CAPSULE | Freq: Every evening | ORAL | Status: DC | PRN

## 2020-02-22 MED ORDER — DIPHENHYDRAMINE HCL 25 MG PO CAPS
25.0000 mg | ORAL_CAPSULE | Freq: Every day | ORAL | Status: DC
  Administered 2020-02-22: 25 mg via ORAL
  Filled 2020-02-22: qty 1

## 2020-02-22 MED ORDER — ALBUTEROL SULFATE HFA 108 (90 BASE) MCG/ACT IN AERS
4.0000 | INHALATION_SPRAY | Freq: Once | RESPIRATORY_TRACT | Status: AC
  Administered 2020-02-22: 4 via RESPIRATORY_TRACT
  Filled 2020-02-22: qty 6.7

## 2020-02-22 MED ORDER — ENOXAPARIN SODIUM 40 MG/0.4ML ~~LOC~~ SOLN
40.0000 mg | SUBCUTANEOUS | Status: DC
  Administered 2020-02-22: 40 mg via SUBCUTANEOUS
  Filled 2020-02-22: qty 0.4

## 2020-02-22 MED ORDER — TAMSULOSIN HCL 0.4 MG PO CAPS
0.4000 mg | ORAL_CAPSULE | Freq: Every day | ORAL | Status: DC
  Administered 2020-02-22 – 2020-02-24 (×3): 0.4 mg via ORAL
  Filled 2020-02-22 (×4): qty 1

## 2020-02-22 MED ORDER — DOCUSATE SODIUM 100 MG PO CAPS
100.0000 mg | ORAL_CAPSULE | Freq: Two times a day (BID) | ORAL | Status: DC | PRN
  Administered 2020-02-23: 100 mg via ORAL
  Filled 2020-02-22: qty 1

## 2020-02-22 MED ORDER — SODIUM CHLORIDE 0.9 % IV BOLUS
1000.0000 mL | Freq: Once | INTRAVENOUS | Status: AC
  Administered 2020-02-22: 1000 mL via INTRAVENOUS

## 2020-02-22 MED ORDER — ONDANSETRON HCL 4 MG PO TABS: 4.0000 mg | ORAL_TABLET | Freq: Four times a day (QID) | ORAL | Status: DC | PRN

## 2020-02-22 MED ORDER — HYDROCODONE-ACETAMINOPHEN 5-325 MG PO TABS: 1.0000 | ORAL_TABLET | Freq: Two times a day (BID) | ORAL | Status: DC | PRN

## 2020-02-22 MED ORDER — ONDANSETRON HCL 4 MG/2ML IJ SOLN
4.0000 mg | Freq: Four times a day (QID) | INTRAMUSCULAR | Status: DC | PRN
  Administered 2020-02-23: 4 mg via INTRAVENOUS
  Filled 2020-02-22: qty 2

## 2020-02-22 MED ORDER — AMLODIPINE BESYLATE 5 MG PO TABS
5.0000 mg | ORAL_TABLET | Freq: Every day | ORAL | Status: DC
  Administered 2020-02-23 – 2020-02-24 (×2): 5 mg via ORAL
  Filled 2020-02-22 (×3): qty 1

## 2020-02-22 MED ORDER — CALCIUM GLUCONATE-NACL 1-0.675 GM/50ML-% IV SOLN
1.0000 g | Freq: Once | INTRAVENOUS | Status: AC
  Administered 2020-02-22: 1000 mg via INTRAVENOUS
  Filled 2020-02-22: qty 50

## 2020-02-22 MED ORDER — DIPHENHYDRAMINE-APAP (SLEEP) 25-500 MG PO TABS: 2.0000 | ORAL_TABLET | Freq: Every day | ORAL | Status: DC

## 2020-02-22 MED ORDER — DEXTROSE 50 % IV SOLN
1.0000 | Freq: Once | INTRAVENOUS | Status: AC
  Administered 2020-02-22: 50 mL via INTRAVENOUS
  Filled 2020-02-22: qty 50

## 2020-02-22 MED ORDER — ACETAMINOPHEN 500 MG PO TABS: 500.0000 mg | ORAL_TABLET | Freq: Every evening | ORAL | Status: DC | PRN

## 2020-02-22 MED ORDER — INSULIN ASPART 100 UNIT/ML IV SOLN
5.0000 [IU] | Freq: Once | INTRAVENOUS | Status: AC
  Administered 2020-02-22: 5 [IU] via INTRAVENOUS

## 2020-02-22 MED ORDER — SODIUM CHLORIDE 0.9 % IV SOLN: INTRAVENOUS | Status: DC

## 2020-02-22 MED ORDER — ONDANSETRON 4 MG PO TBDP: 4.0000 mg | ORAL_TABLET | Freq: Four times a day (QID) | ORAL | Status: DC | PRN

## 2020-02-22 MED ORDER — SODIUM ZIRCONIUM CYCLOSILICATE 5 G PO PACK
10.0000 g | PACK | Freq: Once | ORAL | Status: AC
  Administered 2020-02-22: 10 g via ORAL
  Filled 2020-02-22: qty 2

## 2020-02-22 MED ORDER — JUVEN PO PACK
1.0000 | PACK | Freq: Two times a day (BID) | ORAL | Status: DC
  Administered 2020-02-23 – 2020-02-24 (×4): 1 via ORAL
  Filled 2020-02-22 (×5): qty 1

## 2020-02-22 NOTE — ED Provider Notes (Signed)
[facility] Provider Note   CSN: [id number] Arrival date & time: 02/22/20  1449     History Chief Complaint  Patient presents with  . Weakness    Tony Holt is a 86 y.o. male.  Recent right hemicolectomy for colon cancer. Followed by Constance Haw with gen surg. On bactrim for UTI, cellulitis around incision site.  Also starting Bactrim, redness around incision site has gone away.  Daughter reports that patient has had increase generalized weakness, decreased appetite, has not been eating or drinking very much, has been sipping on some Gatorade.  HPI     Past Medical History:  Diagnosis Date  . BPH (benign prostatic hyperplasia)   . Colon cancer ([place]) 1999  . Dysrhythmia    new onset A Fib  . Essential hypertension   . GERD (gastroesophageal reflux disease)   . Hard of hearing     Patient Active Problem List   Diagnosis Date Noted  . Cecal cancer ([place])   . IDA (iron deficiency anemia) 05/17/2019  . Constipation 05/17/2019  . Bradycardia 05/28/2016  . Weakness 05/28/2016  . Acute renal failure ([place]) 05/28/2016  . Hyperkalemia 05/28/2016  . Nausea alone 03/24/2012  . Abdominal pain, epigastric 03/24/2012  . HTN (hypertension) 03/24/2012  . ANEMIA 09/16/2010  . HEMOCCULT POSITIVE STOOL 09/16/2010    Past Surgical History:  Procedure Laterality Date  . BIOPSY  12/28/2019   Procedure: BIOPSY;  Surgeon: Daneil Dolin, MD;  Location: [facility];  Service: Endoscopy;;  gastric, esophagus,cecal  . CATARACT EXTRACTION W/PHACO  01/18/2013   Procedure: CATARACT EXTRACTION PHACO AND INTRAOCULAR LENS PLACEMENT ([place]);  Surgeon: Tonny Branch, MD;  Location: [facility] ORS;  Service: Ophthalmology;  Laterality: Left;  CDE 17.11  . CATARACT EXTRACTION W/PHACO Right 02/05/2013   Procedure: CATARACT EXTRACTION PHACO AND INTRAOCULAR LENS PLACEMENT (I[facility]);  Surgeon: Tonny Branch, MD;  Location: [facility] ORS;  Service: Ophthalmology;  Laterality: Right;  CDE:19.49  . COLON SURGERY   1999   Sigmoid colon cancer, segmental resection by Drs. Smith/Bradford  . COLONOSCOPY  2002   Multiple adenomatous colon polyps  . COLONOSCOPY N/A 12/28/2019   Procedure: COLONOSCOPY;  Surgeon: Daneil Dolin, MD;  Location: [facility];  Service: Endoscopy;  Laterality: N/A;  . ESOPHAGOGASTRODUODENOSCOPY  03/25/2012   Dr. Gala Romney: Severe ulcerative/erosive reflux esophagitis.  Small hiatal hernia.  Abnormal gastric and duodenal mucosa, biopsy showed reactive changes and minimal chronic inflammation but no H. pylori.  . ESOPHAGOGASTRODUODENOSCOPY N/A 12/28/2019   Procedure: ESOPHAGOGASTRODUODENOSCOPY (EGD);  Surgeon: Daneil Dolin, MD;  Location: [facility];  Service: Endoscopy;  Laterality: N/A;  . HERNIA REPAIR  02/2011   Right inguinal hernia repair with mesh, Dr. Geroge Baseman  . HERNIA REPAIR     three total  . PARTIAL COLECTOMY Right 02/04/2020   Procedure: RIGHT HEMI-COLECTOMY;  Surgeon: Virl Cagey, MD;  Location: [facility] ORS;  Service: General;  Laterality: Right;  . POLYPECTOMY  12/28/2019   Procedure: POLYPECTOMY;  Surgeon: Daneil Dolin, MD;  Location: [facility];  Service: Endoscopy;;  rectal        Family History  Problem Relation Age of Onset  . Heart attack Father        Deceased, age 1s  . Colon cancer Neg Hx   . Liver disease Neg Hx     Social History   Tobacco Use  . Smoking status: Former Smoker    Types: Cigarettes  . Smokeless tobacco: Current User  Types: Snuff  Substance Use Topics  . Alcohol use: No    Alcohol/week: 0.0 standard drinks    Comment: previous heavy drinker at age 22-32  . Drug use: No    Home Medications Prior to Admission medications   Medication Sig Start Date End Date Taking? Authorizing Provider  amLODipine (NORVASC) 5 MG tablet  01/16/20   [provider]  diphenhydramine-acetaminophen (TYLENOL PM) 25-500 MG TABS tablet Take 2 tablets by mouth at bedtime.     [provider]  docusate sodium (COLACE)  100 MG capsule Take 1 capsule (100 mg total) by mouth 2 (two) times daily as needed for mild constipation. 02/07/20   Virl Cagey, MD  HYDROcodone-acetaminophen (NORCO/VICODIN) 5-325 MG tablet Take 1 tablet by mouth every 12 (twelve) hours as needed for moderate pain.  05/13/16   [provider]  nutrition supplement, JUVEN, (JUVEN) PACK Take 1 packet by mouth 2 (two) times daily between meals. 02/07/20 03/08/20  Virl Cagey, MD  omeprazole (PRILOSEC) 20 MG capsule Take 20 mg by mouth 2 (two) times daily. 05/09/16   [provider]  ondansetron (ZOFRAN-ODT) 4 MG disintegrating tablet Take 1 tablet (4 mg total) by mouth every 6 (six) hours as needed for nausea. 02/07/20   Virl Cagey, MD  Pediatric Multiple Vitamins (FLINTSTONES MULTIVITAMIN PO) Take 1 tablet by mouth daily.     [provider]  sulfamethoxazole-trimethoprim (BACTRIM DS) 800-160 MG tablet Take 1 tablet by mouth 2 (two) times daily for 7 days. 02/21/20 02/28/20  Virl Cagey, MD  Tamsulosin HCl (FLOMAX) 0.4 MG CAPS Take 1 capsule (0.4 mg total) by mouth daily. 03/27/12   Sinda Du, MD    Allergies    Patient has no known allergies.  Review of Systems   Review of Systems  Constitutional: Negative for chills and fever.  HENT: Negative for ear pain and sore throat.   Eyes: Negative for pain and visual disturbance.  Respiratory: Negative for cough and shortness of breath.   Cardiovascular: Negative for chest pain and palpitations.  Gastrointestinal: Negative for abdominal pain and vomiting.  Genitourinary: Negative for dysuria and hematuria.  Musculoskeletal: Negative for arthralgias and back pain.  Skin: Negative for color change and rash.  Neurological: Positive for weakness. Negative for seizures and syncope.  All other systems reviewed and are negative.   Physical Exam Updated Vital Signs BP 99/63 (BP Location: Right Arm)   Pulse 91   Temp 97.9 F (36.6 C) (Oral)    Resp (!) 22   Ht 5\' 8"  (1.727 m)   Wt 57.2 kg   SpO2 100%   BMI 19.16 kg/m   Physical Exam Vitals and nursing note reviewed.  Constitutional:      Appearance: He is well-developed.     Comments: Elderly and frail  HENT:     Head: Normocephalic and atraumatic.  Eyes:     Conjunctiva/sclera: Conjunctivae normal.  Cardiovascular:     Rate and Rhythm: Normal rate and regular rhythm.     Heart sounds: No murmur.  Pulmonary:     Effort: Pulmonary effort is normal. No respiratory distress.     Breath sounds: Normal breath sounds.  Abdominal:     Palpations: Abdomen is soft.     Tenderness: There is no abdominal tenderness.     Comments: Midline incision site is C/D/I, Steri-Strips in place, no dehiscence, no overlying erythema, no purulence  Musculoskeletal:        General: No deformity or  signs of injury.     Cervical back: Neck supple.  Skin:    General: Skin is warm and dry.     Capillary Refill: Capillary refill takes less than 2 seconds.  Neurological:     General: No focal deficit present.     Mental Status: He is alert.     ED Results / Procedures / Treatments   Labs (all labs ordered are listed, but only abnormal results are displayed) Labs Reviewed - No data to display  EKG None  Radiology No results found.  Procedures Procedures (including critical care time)  Medications Ordered in ED Medications - No data to display  ED Course  I have reviewed the triage vital signs and the nursing notes.  Pertinent labs & imaging results that were available during my care of the patient were reviewed by me and considered in my medical decision making (see chart for details).    MDM Rules/Calculators/A&P                      86 year old male presenting to ER with generalized weakness, recent hemicolectomy for colon cancer on Bactrim for surgical site infection and UTI.  Here patient was well-appearing with stable vital signs.  Work-up was concerning for acute  kidney injury and hyperkalemia.  Renal ultrasound negative for obstructive process and negative for any injuries related to recent surgery.  Reviewed case with Dr. Constance Haw his surgeon. She requests medicine admission for further management of his AKI and hyperkalemia. Suspect most likely related to recent bactrim use and dehydration. Discussed with Tony Holt who will admit. Provided calcium, insulin, IVF. Dr. Nehemiah Holt started on [place].   Final Clinical Impression(s) / ED Diagnoses Final diagnoses:  Hyperkalemia  Acute kidney injury ([place])  Dehydration  Medication side effect    Rx / DC Orders ED Discharge Orders    None       Lucrezia Starch, MD 02/22/20 1909

## 2020-02-22 NOTE — Progress Notes (Addendum)
[facility]  Post op over 2 weeks from open right hemicolectomy for colon cancer.  I saw him this week and took out his staples. He reported doing fair but was not taking in a lot of fluids or food.   I encouraged intake and got labs and urine due to his low output and retention symptoms.   He was put on bactrim for wound infection and then actually came back with urine culture MRSA.  I continued the bactrim and his daughter said he was drinking more yesterday.   Today she reported more weakness and having some low urine output.  I asked her to take him to the ED.    He has no complaints when I see him now. He says he feels fine. No chest pain or shortness of breath.   BP 125/68   Pulse 90   Temp 97.9 F (36.6 C) (Oral)   Resp 18   Ht 5\' 8"  (1.727 m)   Wt 57.2 kg   SpO2 97%   BMI 19.16 kg/m  NAD Normal work breathing Reg rate  Soft, nondistended, staple line superior with minor improving redness  MAE  CBC    Component Value Date/Time   WBC 8.6 02/22/2020 1525   RBC 3.31 (L) 02/22/2020 1525   HGB 8.3 (L) 02/22/2020 1525   HCT 26.8 (L) 02/22/2020 1525   PLT 417 (H) 02/22/2020 1525   MCV 81.0 02/22/2020 1525   MCH 25.1 (L) 02/22/2020 1525   MCHC 31.0 02/22/2020 1525   RDW 15.5 02/22/2020 1525   LYMPHSABS 0.6 (L) 02/22/2020 1525   MONOABS 0.4 02/22/2020 1525   EOSABS 0.0 02/22/2020 1525   BASOSABS 0.0 02/22/2020 1525    BMP Latest Ref Rng & Units 02/22/2020 02/19/2020 02/07/2020  Glucose 70 - 99 mg/dL 134(H) 97 107(H)  BUN 8 - 23 mg/dL 41(H) 28(H) 25(H)  Creatinine 0.61 - 1.24 mg/dL 2.90(H) 1.95(H) 1.51(H)  Sodium 135 - 145 mmol/L 134(L) 134(L) 136  Potassium 3.5 - 5.1 mmol/L 6.9(HH) 5.5(H) 4.4  Chloride 98 - 111 mmol/L 104 102 108  CO2 22 - 32 mmol/L 21(L) 21(L) 21(L)  Calcium 8.9 - 10.3 mg/dL 9.1 9.2 8.7(L)    Patient 2 weeks post op from a right hemicolectomy for colon cancer. He has been having poor intake and dehydration and also was on  bactrim. Unfortunately he has an acute kidney injury likely related to the bactrim but he is at risk for other etiology due to his surgery and known prostate cancer/ mass. He could have suffered a ureter injury in surgery or have obstruction of the urine in the bladder from his prostate mass. Urology is seeing him as outpatient.  [place] ordered now to evaluate kidney and ureter Dr. Roslynn Amble is shifting his K of 6.9 Asked for hospitalist to admit given medical issues and AKI so I am not missing anything  Midline infection is improving  Urine culture repeated/ unsure if the MRSA is real but he does have this prostate mass so unsure if this could change organisms  Appreciate everyone's assistance. Updated daughter, [name] at the bedside.  Will follow. Can have regular diet and ensures.   Curlene Labrum, MD [facility] [address] [place], [place] [address] [phone] (office)

## 2020-02-22 NOTE — H&P (Signed)
History and Physical  Tony Holt [id number] DOB: [birth year] DOA: 02/22/2020  Referring physician: Dr Roslynn Amble, ED physician PCP: [name], MD  Outpatient Specialists: Constance Haw (Gen Surg), Rourk (GI), Branch (Cards)  Patient Coming From: home  Chief Complaint: Weakness, fatigue  HPI: Tony Holt is a 86 y.o. male with a history of atrial fibrillation, hypertension, GERD, cecal adenocarcinoma 2 weeks status post right hemicolectomy with reanastomosis.  Patient was seen in follow-up with Dr. Constance Haw 3 days ago and placed on Bactrim due to concerns of UTI, as well as slight wound infection.  He was also instructed to increase his intake of oral food and liquids as his intake was minimal.  He may with increasing weakness, fatigue.  No palliating or provoking factors.  He still having oral intake.  Feels like his symptoms are worsening.  Note, he was recently diagnosed with atrial fibrillation and is not currently on anticoagulation as the patient is anemic.  He is supposed to follow-up with cardiology.  Emergency Department Course: The emergency department, he was found to have a potassium of 6.9.  His creatinine is 2.9 (baseline 1.5).  Ultrasound was done with no evidence of hydronephrosis.  UA still pending  Review of Systems:   Pt denies any fevers, chills, nausea, vomiting, diarrhea, constipation, abdominal pain, shortness of breath, dyspnea on exertion, orthopnea, cough, wheezing, palpitations, headache, vision changes, lightheadedness, dizziness, melena, rectal bleeding.  Review of systems are otherwise negative  Past Medical History:  Diagnosis Date  . BPH (benign prostatic hyperplasia)   . Colon cancer ([place]) 1999  . Dysrhythmia    new onset A Fib  . Essential hypertension   . GERD (gastroesophageal reflux disease)   . Hard of hearing    Past Surgical History:  Procedure Laterality Date  . BIOPSY  12/28/2019   Procedure: BIOPSY;  Surgeon: Daneil Dolin,  MD;  Location: [facility];  Service: Endoscopy;;  gastric, esophagus,cecal  . CATARACT EXTRACTION W/PHACO  01/18/2013   Procedure: CATARACT EXTRACTION PHACO AND INTRAOCULAR LENS PLACEMENT ([place]);  Surgeon: Tonny Branch, MD;  Location: [facility];  Service: Ophthalmology;  Laterality: Left;  CDE 17.11  . CATARACT EXTRACTION W/PHACO Right 02/05/2013   Procedure: CATARACT EXTRACTION PHACO AND INTRAOCULAR LENS PLACEMENT ([facility]);  Surgeon: Tonny Branch, MD;  Location: [facility];  Service: Ophthalmology;  Laterality: Right;  CDE:19.49  . COLON SURGERY  1999   Sigmoid colon cancer, segmental resection by Drs. Smith/Bradford  . COLONOSCOPY  2002   Multiple adenomatous colon polyps  . COLONOSCOPY N/A 12/28/2019   Procedure: COLONOSCOPY;  Surgeon: Daneil Dolin, MD;  Location: [facility];  Service: Endoscopy;  Laterality: N/A;  . ESOPHAGOGASTRODUODENOSCOPY  03/25/2012   Dr. Gala Romney: Severe ulcerative/erosive reflux esophagitis.  Small hiatal hernia.  Abnormal gastric and duodenal mucosa, biopsy showed reactive changes and minimal chronic inflammation but no H. pylori.  . ESOPHAGOGASTRODUODENOSCOPY N/A 12/28/2019   Procedure: ESOPHAGOGASTRODUODENOSCOPY (EGD);  Surgeon: Daneil Dolin, MD;  [facility];  Service: Endoscopy;  Laterality: N/A;  . HERNIA REPAIR  02/2011   Right inguinal hernia repair with mesh, Dr. Geroge Baseman  . HERNIA REPAIR     three total  . PARTIAL COLECTOMY Right 02/04/2020   Procedure: RIGHT HEMI-COLECTOMY;  Surgeon: Virl Cagey, MD;  Location: [facility];  Service: General;  Laterality: Right;  . POLYPECTOMY  12/28/2019   Procedure: POLYPECTOMY;  Surgeon: Daneil Dolin, MD;  [facility];  Service: Endoscopy;;  rectal  Social History:  reports that he has quit smoking. His smoking use included cigarettes. His smokeless tobacco use includes snuff. He reports that he does not drink alcohol or use drugs. Patient lives at home  No Known Allergies  Family History   Problem Relation Age of Onset  . Heart attack Father        Deceased, age 71s  . Colon cancer Neg Hx   . Liver disease Neg Hx       Prior to Admission medications   Medication Sig Start Date End Date Taking? Authorizing Provider  amLODipine (NORVASC) 5 MG tablet  01/16/20   [provider]  diphenhydramine-acetaminophen (TYLENOL PM) 25-500 MG TABS tablet Take 2 tablets by mouth at bedtime.     [provider]  docusate sodium (COLACE) 100 MG capsule Take 1 capsule (100 mg total) by mouth 2 (two) times daily as needed for mild constipation. 02/07/20   Virl Cagey, MD  HYDROcodone-acetaminophen (NORCO/VICODIN) 5-325 MG tablet Take 1 tablet by mouth every 12 (twelve) hours as needed for moderate pain.  05/13/16   [provider]  nutrition supplement, JUVEN, (JUVEN) PACK Take 1 packet by mouth 2 (two) times daily between meals. 02/07/20 03/08/20  Virl Cagey, MD  omeprazole (PRILOSEC) 20 MG capsule Take 20 mg by mouth 2 (two) times daily. 05/09/16   [provider]  ondansetron (ZOFRAN-ODT) 4 MG disintegrating tablet Take 1 tablet (4 mg total) by mouth every 6 (six) hours as needed for nausea. 02/07/20   Virl Cagey, MD  Pediatric Multiple Vitamins (FLINTSTONES MULTIVITAMIN PO) Take 1 tablet by mouth daily.     [provider]  sulfamethoxazole-trimethoprim (BACTRIM DS) 800-160 MG tablet Take 1 tablet by mouth 2 (two) times daily for 7 days. 02/21/20 02/28/20  Virl Cagey, MD  Tamsulosin HCl (FLOMAX) 0.4 MG CAPS Take 1 capsule (0.4 mg total) by mouth daily. 03/27/12   Sinda Du, MD    Physical Exam: BP (!) 112/59   Pulse 73   Temp 97.9 F (36.6 C) (Oral)   Resp (!) 24   Ht 5\' 8"  (1.727 m)   Wt 57.2 kg   SpO2 97%   BMI 19.16 kg/m   . General: Tony Holt male. Awake and alert and oriented x3. No acute cardiopulmonary distress.  Marland Kitchen HEENT: Normocephalic atraumatic.  Right and left ears normal in appearance.  Pupils equal, round,  reactive to light. Extraocular muscles are intact. Sclerae anicteric and noninjected.  Moist mucosal membranes. No mucosal lesions.  . Neck: Neck supple without lymphadenopathy. No carotid bruits. No masses palpated.  . Cardiovascular: Irregularly irregular rate with normal sounds. No murmurs, rubs, gallops auscultated. No JVD.  Marland Kitchen Respiratory: Good respiratory effort with no wheezes, rales, rhonchi. Lungs clear to auscultation bilaterally.  No accessory muscle use. . Abdomen: Soft, nontender, nondistended. Active bowel sounds. No masses or hepatosplenomegaly.  Abdominal incision clean, dry, intact.  He appears well-healing . Skin: No rashes, lesions, or ulcerations.  Dry, warm to touch. 2+ dorsalis pedis and radial pulses. . Musculoskeletal: No calf or leg pain. All major joints not erythematous nontender.  No upper or lower joint deformation.  Good ROM.  No contractures  . Psychiatric: Intact judgment and insight. Pleasant and cooperative. . Neurologic: No focal neurological deficits. Strength is 5/5 and symmetric in upper and lower extremities.  Cranial nerves II through XII are grossly intact.           Labs on Admission: I have personally reviewed  following labs and imaging studies  CBC: Recent Labs  Lab 02/19/20 1257 02/22/20 1525  WBC 10.7* 8.6  NEUTROABS 8.7* 7.5  HGB 9.0* 8.3*  HCT 30.4* 26.8*  MCV 84.9 81.0  PLT 430* [id number]*   Basic Metabolic Panel: Recent Labs  Lab 02/19/20 1257 02/22/20 1525  NA 134* 134*  K 5.5* 6.9*  CL 102 104  CO2 21* 21*  GLUCOSE 97 134*  BUN 28* 41*  CREATININE 1.95* 2.90*  CALCIUM 9.2 9.1   GFR: Estimated Creatinine Clearance: 13.4 mL/min (A) (by C-G formula based on SCr of 2.9 mg/dL (H)). Liver Function Tests: Recent Labs  Lab 02/22/20 1525  AST 17  ALT 14  ALKPHOS 65  BILITOT 0.4  PROT 7.0  ALBUMIN 3.8   No results for input(s): LIPASE, AMYLASE in the last 168 hours. No results for input(s): AMMONIA in the last 168  hours. Coagulation Profile: No results for input(s): INR, PROTIME in the last 168 hours. Cardiac Enzymes: No results for input(s): CKTOTAL, CKMB, CKMBINDEX, TROPONINI in the last 168 hours. BNP (last 3 results) No results for input(s): PROBNP in the last 8760 hours. HbA1C: No results for input(s): HGBA1C in the last 72 hours. CBG: No results for input(s): GLUCAP in the last 168 hours. Lipid Profile: No results for input(s): CHOL, HDL, LDLCALC, TRIG, CHOLHDL, LDLDIRECT in the last 72 hours. Thyroid Function Tests: No results for input(s): TSH, T4TOTAL, FREET4, T3FREE, THYROIDAB in the last 72 hours. Anemia Panel: No results for input(s): VITAMINB12, FOLATE, FERRITIN, TIBC, IRON, RETICCTPCT in the last 72 hours. Urine analysis:    Component Value Date/Time   COLORURINE YELLOW 02/19/2020 1257   APPEARANCEUR HAZY (A) 02/19/2020 1257   LABSPEC 1.016 02/19/2020 1257   PHURINE 6.0 02/19/2020 1257   GLUCOSEU NEGATIVE 02/19/2020 1257   HGBUR NEGATIVE 02/19/2020 1257   BILIRUBINUR NEGATIVE 02/19/2020 1257   KETONESUR NEGATIVE 02/19/2020 1257   PROTEINUR NEGATIVE 02/19/2020 1257   UROBILINOGEN 0.2 03/23/2012 2041   NITRITE NEGATIVE 02/19/2020 1257   LEUKOCYTESUR SMALL (A) 02/19/2020 1257   Sepsis Labs: @LABRCNTIP (procalcitonin:4,lacticidven:4) ) Recent Results (from the past 240 hour(s))  Culture, Urine     Status: Abnormal   Collection Time: 02/19/20 12:57 PM   Specimen: Urine, Clean Catch  Result Value Ref Range Status   Specimen Description   Final    URINE, CLEAN CATCH Performed at [facility], [address], [place], [place] [address]    Special Requests   Final    NONE Performed at [facility], [address], [place],  [address]    Culture (A)  Final    80,000 COLONIES/mL METHICILLIN RESISTANT STAPHYLOCOCCUS AUREUS   Report Status 02/21/2020 FINAL  Final   Organism ID, Bacteria METHICILLIN RESISTANT STAPHYLOCOCCUS AUREUS (A)  Final      Susceptibility    Methicillin resistant staphylococcus aureus - MIC*    CIPROFLOXACIN >=8 RESISTANT Resistant     GENTAMICIN <=0.5 SENSITIVE Sensitive     NITROFURANTOIN <=16 SENSITIVE Sensitive     OXACILLIN >=4 RESISTANT Resistant     TETRACYCLINE <=1 SENSITIVE Sensitive     VANCOMYCIN 1 SENSITIVE Sensitive     TRIMETH/SULFA <=10 SENSITIVE Sensitive     CLINDAMYCIN <=0.25 SENSITIVE Sensitive     RIFAMPIN <=0.5 SENSITIVE Sensitive     Inducible Clindamycin NEGATIVE Sensitive     * 80,000 COLONIES/mL METHICILLIN RESISTANT STAPHYLOCOCCUS AUREUS     Radiological Exams on Admission: US RENAL  Result Date: 02/22/2020 CLINICAL DATA:  Acute kidney injury.  EXAM: RENAL / URINARY TRACT ULTRASOUND COMPLETE COMPARISON:  01/17/2020 CT abdomen/pelvis. FINDINGS: Right Kidney: Renal measurements: 11.2 x 4.8 x 6.0 cm = volume: 169 mL. Normal renal parenchymal echogenicity and thickness. No hydronephrosis. Multiple simple appearing right renal cysts, largest 7.6 x 5.0 x 5.9 cm in the upper right kidney. Left Kidney: Renal measurements: 10.8 x 6.1 x 5.3 cm = volume: 182 mL. Normal renal parenchymal echogenicity and thickness. No hydronephrosis. Multiple simple appearing left renal cysts, largest 4.2 x 3.3 x 4.7 cm in the lateral lower left kidney. Bladder: Appears normal for degree of bladder distention. Ureteral jets demonstrated bilaterally in the bladder. Other: Enlarged prostate measuring approximately 6.7 x 4.2 x 5.2 cm. IMPRESSION: 1. No hydronephrosis. 2. Simple bilateral renal cysts. 3. Prostatomegaly. Please see recent 01/17/2020 CT report for further details regarding the prostate. Electronically Signed   By: Ilona Sorrel M.D.   On: 02/22/2020 17:31    EKG: Independently reviewed.  Atrial fibrillation VH.  No peaked T waves  Assessment/Plan: Principal Problem:   Hyperkalemia Active Problems:   HTN (hypertension)   Acute renal failure (HCC)   IDA (iron deficiency anemia)   Cecal cancer (HCC)   Prostate cancer  (HCC)   Atrial fibrillation, chronic ([place])    This patient was discussed with the ED physician, including pertinent vitals, physical exam findings, labs, and imaging.  We also discussed care given by the ED provider.  1. Hyperkalemia a. Patient on telemetry b. Given insulin, dextrose, calcium gluconate c. We will give the patient Lokelma d. Check potassium later on this evening and tomorrow morning 2. Acute renal failure (acute necrosis secondary to Bactrim) a. We will stop Bactrim b. IV fluids 3. Adenocarcinoma colon Cancer a. 2-week status post hemicolectomy and reanastomosis 4. Prostate cancer 5. Atrial fibrillation a. CHA2DS2-VASc score of 3 b. Not currently on anticoagulation -Per cards notes, no anticoagulation secondary to chronic anemia. c. Check TSH d. Has had ultrasounds beginning of February 6. Hypertension a. Continue home medicine 7. Iron deficiency anemia  DVT prophylaxis: Lovenox Consultants: General surgery following Code Status: Full code Family Communication: Daughter present during interview and exam Disposition Plan: Patient should be able to return home following stabilization of potassium   Truett Mainland, DO

## 2020-02-22 NOTE — ED Triage Notes (Addendum)
Patient brought to ED by daughter for weakness for past 2 days. Pt seen by Dr. Constance Haw Tuesday following tumor removal from colon x2 weeks ago per daughter. Redness to area noted.

## 2020-02-22 NOTE — ED Notes (Signed)
Patient denies pain and is resting comfortably.  

## 2020-02-23 ENCOUNTER — Other Ambulatory Visit: Payer: Self-pay

## 2020-02-23 DIAGNOSIS — K219 Gastro-esophageal reflux disease without esophagitis: Secondary | ICD-10-CM | POA: Diagnosis present

## 2020-02-23 DIAGNOSIS — N179 Acute kidney failure, unspecified: Secondary | ICD-10-CM | POA: Diagnosis present

## 2020-02-23 DIAGNOSIS — Z8249 Family history of ischemic heart disease and other diseases of the circulatory system: Secondary | ICD-10-CM | POA: Diagnosis not present

## 2020-02-23 DIAGNOSIS — I129 Hypertensive chronic kidney disease with stage 1 through stage 4 chronic kidney disease, or unspecified chronic kidney disease: Secondary | ICD-10-CM | POA: Diagnosis present

## 2020-02-23 DIAGNOSIS — E860 Dehydration: Secondary | ICD-10-CM | POA: Diagnosis present

## 2020-02-23 DIAGNOSIS — E875 Hyperkalemia: Secondary | ICD-10-CM | POA: Diagnosis present

## 2020-02-23 DIAGNOSIS — Z85038 Personal history of other malignant neoplasm of large intestine: Secondary | ICD-10-CM | POA: Diagnosis not present

## 2020-02-23 DIAGNOSIS — Z8546 Personal history of malignant neoplasm of prostate: Secondary | ICD-10-CM | POA: Diagnosis not present

## 2020-02-23 DIAGNOSIS — Z87891 Personal history of nicotine dependence: Secondary | ICD-10-CM | POA: Diagnosis not present

## 2020-02-23 DIAGNOSIS — Z79891 Long term (current) use of opiate analgesic: Secondary | ICD-10-CM | POA: Diagnosis not present

## 2020-02-23 DIAGNOSIS — D509 Iron deficiency anemia, unspecified: Secondary | ICD-10-CM | POA: Diagnosis present

## 2020-02-23 DIAGNOSIS — N1832 Chronic kidney disease, stage 3b: Secondary | ICD-10-CM | POA: Diagnosis present

## 2020-02-23 DIAGNOSIS — Z20822 Contact with and (suspected) exposure to covid-19: Secondary | ICD-10-CM | POA: Diagnosis present

## 2020-02-23 DIAGNOSIS — Z9049 Acquired absence of other specified parts of digestive tract: Secondary | ICD-10-CM | POA: Diagnosis not present

## 2020-02-23 DIAGNOSIS — E872 Acidosis: Secondary | ICD-10-CM | POA: Diagnosis present

## 2020-02-23 DIAGNOSIS — I482 Chronic atrial fibrillation, unspecified: Secondary | ICD-10-CM | POA: Diagnosis present

## 2020-02-23 DIAGNOSIS — Z79899 Other long term (current) drug therapy: Secondary | ICD-10-CM | POA: Diagnosis not present

## 2020-02-23 DIAGNOSIS — L0390 Cellulitis, unspecified: Secondary | ICD-10-CM | POA: Diagnosis present

## 2020-02-23 DIAGNOSIS — H9190 Unspecified hearing loss, unspecified ear: Secondary | ICD-10-CM | POA: Diagnosis present

## 2020-02-23 LAB — BASIC METABOLIC PANEL
Anion gap: 7 (ref 5–15)
BUN: 32 mg/dL — ABNORMAL HIGH (ref 8–23)
CO2: 21 mmol/L — ABNORMAL LOW (ref 22–32)
Calcium: 8.9 mg/dL (ref 8.9–10.3)
Chloride: 110 mmol/L (ref 98–111)
Creatinine, Ser: 2.33 mg/dL — ABNORMAL HIGH (ref 0.61–1.24)
GFR calc Af Amer: 27 mL/min — ABNORMAL LOW (ref >60)
GFR calc non Af Amer: 24 mL/min — ABNORMAL LOW (ref >60)
Glucose, Bld: 91 mg/dL (ref 70–99)
Potassium: 5.9 mmol/L — ABNORMAL HIGH (ref 3.5–5.1)
Sodium: 138 mmol/L (ref 135–145)

## 2020-02-23 LAB — RETICULOCYTES
Immature Retic Fract: 17.1 % — ABNORMAL HIGH (ref 2.3–15.9)
RBC.: 2.94 MIL/uL — ABNORMAL LOW (ref 4.22–5.81)
Retic Count, Absolute: 30.9 10*3/uL (ref 19.0–186.0)
Retic Ct Pct: 1.1 % (ref 0.4–3.1)

## 2020-02-23 LAB — TSH: TSH: 1.957 u[IU]/mL (ref 0.350–4.500)

## 2020-02-23 LAB — VITAMIN B12: Vitamin B-12: 247 pg/mL (ref 180–914)

## 2020-02-23 LAB — IRON AND TIBC
Iron: 14 ug/dL — ABNORMAL LOW (ref 45–182)
Saturation Ratios: 4 % — ABNORMAL LOW (ref 17.9–39.5)
TIBC: 358 ug/dL (ref 250–450)
UIBC: 344 ug/dL

## 2020-02-23 LAB — FOLATE: Folate: 23.5 ng/mL (ref >5.9)

## 2020-02-23 LAB — SARS CORONAVIRUS 2 (TAT 6-24 HRS): SARS Coronavirus 2: NEGATIVE

## 2020-02-23 LAB — FERRITIN: Ferritin: 19 ng/mL — ABNORMAL LOW (ref 24–336)

## 2020-02-23 MED ORDER — SODIUM ZIRCONIUM CYCLOSILICATE 10 G PO PACK
10.0000 g | PACK | Freq: Two times a day (BID) | ORAL | Status: DC
  Administered 2020-02-23 – 2020-02-24 (×3): 10 g via ORAL
  Filled 2020-02-23 (×3): qty 1

## 2020-02-23 MED ORDER — SODIUM BICARBONATE 650 MG PO TABS
650.0000 mg | ORAL_TABLET | Freq: Three times a day (TID) | ORAL | Status: DC
  Administered 2020-02-23 – 2020-02-24 (×6): 650 mg via ORAL
  Filled 2020-02-23 (×7): qty 1

## 2020-02-23 MED ORDER — ACETAMINOPHEN 500 MG PO TABS
500.0000 mg | ORAL_TABLET | Freq: Every day | ORAL | Status: DC
  Administered 2020-02-23 – 2020-02-24 (×2): 500 mg via ORAL
  Filled 2020-02-23 (×2): qty 1

## 2020-02-23 MED ORDER — SODIUM ZIRCONIUM CYCLOSILICATE 10 G PO PACK: 10.0000 g | PACK | Freq: Once | ORAL | Status: DC

## 2020-02-23 MED ORDER — HEPARIN SODIUM (PORCINE) 5000 UNIT/ML IJ SOLN
5000.0000 [IU] | Freq: Three times a day (TID) | INTRAMUSCULAR | Status: DC
  Administered 2020-02-23 – 2020-02-24 (×2): 5000 [IU] via SUBCUTANEOUS
  Filled 2020-02-23 (×2): qty 1

## 2020-02-23 MED ORDER — DIPHENHYDRAMINE HCL 25 MG PO CAPS
25.0000 mg | ORAL_CAPSULE | Freq: Every day | ORAL | Status: DC
  Administered 2020-02-23 – 2020-02-24 (×2): 25 mg via ORAL
  Filled 2020-02-23 (×2): qty 1

## 2020-02-23 MED ORDER — HEPARIN SODIUM (PORCINE) 5000 UNIT/ML IJ SOLN
5000.0000 [IU] | Freq: Three times a day (TID) | INTRAMUSCULAR | Status: DC
  Administered 2020-02-23: 5000 [IU] via SUBCUTANEOUS
  Filled 2020-02-23: qty 1

## 2020-02-23 NOTE — Progress Notes (Signed)
[facility] Progress Note     Subjective: Tolerating diet, no major complaints. K down some   Objective: Vital signs in last 24 hours: Temp:  [97.9 F (36.6 C)-98.5 F (36.9 C)] 98.3 F (36.8 C) (03/13 1300) Pulse Rate:  [35-117] 86 (03/13 1300) Resp:  [16-24] 20 (03/13 0836) BP: (99-129)/(54-72) 129/69 (03/13 1300) SpO2:  [91 %-100 %] 98 % (03/13 1300) Weight:  [57.2 kg-57.9 kg] 57.9 kg (03/12 2015) Last BM Date: (P) (2 days ago)  Intake/Output from previous day: 03/12 0701 - 03/13 0700 In: 1284.9 [P.O.:120; I.V.:1164.9] Out: 500 [Urine:500] Intake/Output this shift: Total I/O In: 766.6 [P.O.:120; I.V.:646.6] Out: -   General appearance: alert, cooperative and no distress Resp: normal work of breathing GI: soft, nondistended, midline c/d/i with minor erythema superior at the crease of umbilicus, some moisture in this area Extremities: extremities normal, atraumatic, no cyanosis or edema  Lab Results:  Recent Labs    02/22/20 1525  WBC 8.6  HGB 8.3*  HCT 26.8*  PLT 417*   BMET Recent Labs    02/22/20 1525 02/22/20 1525 02/22/20 1949 02/23/20 0642  NA 134*  --   --  138  K 6.9*   < > 5.4* 5.9*  CL 104  --   --  110  CO2 21*  --   --  21*  GLUCOSE 134*  --   --  91  BUN 41*  --   --  32*  CREATININE 2.90*  --   --  2.33*  CALCIUM 9.1  --   --  8.9   < > = values in this interval not displayed.   PT/INR No results for input(s): LABPROT, INR in the last 72 hours.  Studies/Results: US RENAL  Result Date: 02/22/2020 CLINICAL DATA:  Acute kidney injury. EXAM: RENAL / URINARY TRACT ULTRASOUND COMPLETE COMPARISON:  01/17/2020 CT abdomen/pelvis. FINDINGS: Right Kidney: Renal measurements: 11.2 x 4.8 x 6.0 cm = volume: 169 mL. Normal renal parenchymal echogenicity and thickness. No hydronephrosis. Multiple simple appearing right renal cysts, largest 7.6 x 5.0 x 5.9 cm in the upper right kidney. Left Kidney: Renal measurements: 10.8 x 6.1 x 5.3  cm = volume: 182 mL. Normal renal parenchymal echogenicity and thickness. No hydronephrosis. Multiple simple appearing left renal cysts, largest 4.2 x 3.3 x 4.7 cm in the lateral lower left kidney. Bladder: Appears normal for degree of bladder distention. Ureteral jets demonstrated bilaterally in the bladder. Other: Enlarged prostate measuring approximately 6.7 x 4.2 x 5.2 cm. IMPRESSION: 1. No hydronephrosis. 2. Simple bilateral renal cysts. 3. Prostatomegaly. Please see recent 01/17/2020 CT report for further details regarding the prostate. Electronically Signed   By: Ilona Sorrel M.D.   On: 02/22/2020 17:31     Assessment/Plan: [name] is a 86 yo almost 3 weeks out from a right hemicolectomy for colon cancer. Had some minor skin erythema and was placed on bactrim, then had MRSA come back in his urine.  AKI from dehydration and some bactrim effect. Doing better today. K down. Appreciate hospitalist help.  PRN for pain Needs to get up and move, walk so he does not get weak, discussed with RN Diet as tolerated Lokelma ordered for hyperkalemia  Urine Culture repeat  Has tylenol and bendadryl ordered for qhs   Discussed with Dr. Manuella Ghazi.   LOS: 0 days    Virl Cagey 02/23/2020

## 2020-02-23 NOTE — Plan of Care (Signed)
  Problem: Nutrition: Goal: Adequate nutrition will be maintained Outcome: Progressing   Problem: Pain Managment: Goal: General experience of comfort will improve Outcome: Progressing   Problem: Safety: Goal: Ability to remain free from injury will improve Outcome: Progressing   

## 2020-02-23 NOTE — Progress Notes (Signed)
Initial Nutrition Assessment  DOCUMENTATION CODES:   Not applicable  INTERVENTION:  Continue Ensure Enlive po BID, each supplement provides 350 kcal and 20 grams of protein (pt likes chocolate)  Continue Juven BID, each packet provides 95 calories, 2.5 grams of protein (collagen), and 9.8 grams of carbohydrate (3 grams sugar); also contains 7 grams of L-arginine and L-glutamine, 300 mg vitamin C, 15 mg vitamin E, 1.2 mcg vitamin B-12, 9.5 mg zinc, 200 mg calcium, and 1.5 g  Calcium Beta-hydroxy-Beta-methylbutyrate to support wound healing  Magic cup BID with meals, each supplement provides 290 kcal and 9 grams of protein    NUTRITION DIAGNOSIS:   Inadequate oral intake related to decreased appetite as evidenced by meal completion < 50%.   GOAL:   Patient will meet greater than or equal to 90% of their needs    MONITOR:   PO intake, Weight trends, Supplement acceptance, I & O's, Labs  REASON FOR ASSESSMENT:   Malnutrition Screening Tool    ASSESSMENT:  RD working remotely.  86 year old male with past medical history of atrial fibrillation, CKD stage III, HTN, GERD, cecal adenocarcinoma s/p right hemicolectomy with reanastomosis 2 weeks ago presented with increasing weakness and fatigue.  Patient admitted on 3/13 with hyperkalemia  Per notes: -seen in follow-up 3 days prior to admission, placed on Bactrim d/t concerns of UTI and slight wound infection -repeat UA clear, repeat culture pending -intrinsic AKI -Cr improving on IV fluid, adequate UOP -renal US without acute findings -ongoing hyperkalemia on lokelma  Patient was seen by this RD during last admission. Daughter who was at bedside reported that patient had been experiencing fatigue and poor intake over the past few months. Patient endorsed  consuming 2-3 chocolate Ensure daily to aid with decreased intake.   Patient is eating 20-50% x 2 documented meals this admission and provided Ensure BID per medication  review. Will order Magic Cup on lunch and dinner trays.  I/Os: +784 ml since admit UOP: 500 ml x 24 hrs  Current wt 127.38 lbs Weight history reviewed, 127-133 lbs over the past 3 months.  Medications reviewed and include: Juven, Protonix, Lokelma IVF: NaCl  Labs: BG 91, K 5.9 (H), BUN 32 (H), Cr 2.33 (H)  NUTRITION - FOCUSED PHYSICAL EXAM: Unable to complete at this time, RD working remotely.   Diet Order:   Diet Order            Diet regular Room service appropriate? Yes; Fluid consistency: Thin  Diet effective now              EDUCATION NEEDS:   No education needs have been identified at this time  Skin:  Skin Assessment: Skin Integrity Issues: Skin Integrity Issues:: Incisions Incisions: Closed;abdomen (2/22)  Last BM:  3/11  Height:   Ht Readings from Last 1 Encounters:  02/22/20 5\' 8"  (1.727 m)    Weight:   Wt Readings from Last 1 Encounters:  02/22/20 57.9 kg    Ideal Body Weight:     BMI:  Body mass index is 19.41 kg/m.  Estimated Nutritional Needs:   Kcal:  1900-2100  Protein:  95-105  Fluid:  >/= 1.6 L/day   [name], RD, LDN Clinical Nutrition After Hours/Weekend Pager # in [place]

## 2020-02-23 NOTE — Progress Notes (Signed)
PROGRESS NOTE    Tony Holt  [id number] DOB: [birth year] DOA: 02/22/2020 PCP: [name], MD   Brief Narrative:  Per HPI: Tony Holt is a 86 y.o. male with a history of atrial fibrillation, hypertension, GERD, cecal adenocarcinoma 2 weeks status post right hemicolectomy with reanastomosis.  Patient was seen in follow-up with Dr. Constance Haw 3 days ago and placed on Bactrim due to concerns of UTI, as well as slight wound infection.  He was also instructed to increase his intake of oral food and liquids as his intake was minimal.  He may with increasing weakness, fatigue.  No palliating or provoking factors.  He still having oral intake.  Feels like his symptoms are worsening.  Note, he was recently diagnosed with atrial fibrillation and is not currently on anticoagulation as the patient is anemic.  He is supposed to follow-up with cardiology.  3/13: Patient presented with some symptomatic AKI and hyperkalemia related to poor oral intake as well as recent Bactrim use.  FeNa reveals intrinsic AKI, and creatinine is improving on IV fluid.  He is maintaining adequate urine output, but will measure strict I's and O's.  Continue current plan.  Renal ultrasound without any acute findings.  Lokelma twice daily to help manage potassium better as well as sodium bicarbonate 3 times daily to help correct acidosis.  Follow a.m. labs.  Assessment & Plan:   Principal Problem:   Hyperkalemia Active Problems:   HTN (hypertension)   Acute renal failure (HCC)   IDA (iron deficiency anemia)   Cecal cancer (HCC)   Prostate cancer (HCC)   Atrial fibrillation, chronic (HCC)   Symptomatic, nonoliguric AKI on CKD stage IIIb with hyperkalemia and mild metabolic acidosis -Appears to have intrinsic AKI with FeNa of 1.9% -Renal ultrasound without any acute findings of obstruction -Continue current IV fluid and monitor strict I's and O's -Lokelma twice daily with ongoing hyperkalemia  noted -Sodium bicarbonate 3 times daily as ordered -Urine output of 500 mL documented since admission -Avoid nephrotoxic agents and consider nephrology consultation if not demonstrating significant improvement by a.m.  Recent MRSA UTI -Repeat UA appears clear, but repeat culture pending -Would recommend Macrobid for further treatment if indicated -He is currently asymptomatic  Recent hemicolectomy and reanastomosis in setting of colon adenocarcinoma -Per general surgery  History of prostate cancer -Prostatomegaly noted on renal ultrasound, but no signs of obstruction -Continue tamsulosin  Atrial fibrillation -Currently rate controlled -Not on anticoagulation due to chronic anemia  Hypertension-controlled -Continue amlodipine  Iron deficiency anemia -Hemoglobin slightly downtrending and will check CBC repeat in a.m. -No overt bleeding -Does not appear to be on supplementation and will check anemia panel and consider Feraheme while inpatient  DVT prophylaxis: Lovenox to heparin Code Status: Full Family Communication: Plan to call daughter Disposition Plan: Continue treatment with IV fluid and continue to correct potassium as well as acidosis.  Will consider nephrology evaluation if needed.  Continue following labs.   Consultants:   General surgery  Procedures:   None  Antimicrobials:   None   Subjective: Patient seen and evaluated today with no new acute complaints or concerns. No acute concerns or events noted overnight.  He states he has been having good urine output.  Objective: Vitals:   02/22/20 2015 02/23/20 0502 02/23/20 0505 02/23/20 0836  BP: 126/64 (!) 109/54 123/65 117/72  Pulse: (!) 107 (!) 35 92 82  Resp: 18 19 19 20   Temp: 98.5 F (36.9 C) 98 F (36.7 C) 98.5 F (  36.9 C) 98.1 F (36.7 C)  TempSrc: Oral Oral Oral Oral  SpO2: 96% 91% 98% 100%  Weight: 57.9 kg     Height: 5\' 8"  (1.727 m)       Intake/Output Summary (Last 24 hours) at  02/23/2020 0905 Last data filed at 02/23/2020 0753 Gross per 24 hour  Intake 1473.28 ml  Output 500 ml  Net 973.28 ml   Filed Weights   02/22/20 1459 02/22/20 2015  Weight: 57.2 kg 57.9 kg    Examination:  General exam: Appears calm and comfortable  Respiratory system: Clear to auscultation. Respiratory effort normal. Cardiovascular system: S1 & S2 heard, RRR. No JVD, murmurs, rubs, gallops or clicks. No pedal edema. Gastrointestinal system: Abdominal binder present. No organomegaly or masses felt. Normal bowel sounds heard. Central nervous system: Alert and oriented. No focal neurological deficits. Extremities: Symmetric 5 x 5 power. Skin: No rashes, lesions or ulcers Psychiatry: Judgement and insight appear normal. Mood & affect appropriate.     Data Reviewed: I have personally reviewed following labs and imaging studies  CBC: Recent Labs  Lab 02/19/20 1257 02/22/20 1525  WBC 10.7* 8.6  NEUTROABS 8.7* 7.5  HGB 9.0* 8.3*  HCT 30.4* 26.8*  MCV 84.9 81.0  PLT 430* [id number]*   Basic Metabolic Panel: Recent Labs  Lab 02/19/20 1257 02/22/20 1525 02/22/20 1949 02/23/20 0642  NA 134* 134*  --  138  K 5.5* 6.9* 5.4* 5.9*  CL 102 104  --  110  CO2 21* 21*  --  21*  GLUCOSE 97 134*  --  91  BUN 28* 41*  --  32*  CREATININE 1.95* 2.90*  --  2.33*  CALCIUM 9.2 9.1  --  8.9   GFR: Estimated Creatinine Clearance: 16.9 mL/min (A) (by C-G formula based on SCr of 2.33 mg/dL (H)). Liver Function Tests: Recent Labs  Lab 02/22/20 1525  AST 17  ALT 14  ALKPHOS 65  BILITOT 0.4  PROT 7.0  ALBUMIN 3.8   No results for input(s): LIPASE, AMYLASE in the last 168 hours. No results for input(s): AMMONIA in the last 168 hours. Coagulation Profile: No results for input(s): INR, PROTIME in the last 168 hours. Cardiac Enzymes: No results for input(s): CKTOTAL, CKMB, CKMBINDEX, TROPONINI in the last 168 hours. BNP (last 3 results) No results for input(s): PROBNP in the last 8760  hours. HbA1C: No results for input(s): HGBA1C in the last 72 hours. CBG: No results for input(s): GLUCAP in the last 168 hours. Lipid Profile: No results for input(s): CHOL, HDL, LDLCALC, TRIG, CHOLHDL, LDLDIRECT in the last 72 hours. Thyroid Function Tests: No results for input(s): TSH, T4TOTAL, FREET4, T3FREE, THYROIDAB in the last 72 hours. Anemia Panel: No results for input(s): VITAMINB12, FOLATE, FERRITIN, TIBC, IRON, RETICCTPCT in the last 72 hours. Sepsis Labs: No results for input(s): PROCALCITON, LATICACIDVEN in the last 168 hours.  Recent Results (from the past 240 hour(s))  Culture, Urine     Status: Abnormal   Collection Time: 02/19/20 12:57 PM   Specimen: Urine, Clean Catch  Result Value Ref Range Status   Specimen Description   Final    URINE, CLEAN CATCH Performed at [facility], [address] [place], [place] [address]    Special Requests   Final    NONE Performed at [facility], [address], [place], [place] [address]    Culture (A)  Final    80,000 COLONIES/mL METHICILLIN RESISTANT STAPHYLOCOCCUS AUREUS   Report Status 02/21/2020 FINAL  Final  Organism ID, Bacteria METHICILLIN RESISTANT STAPHYLOCOCCUS AUREUS (A)  Final      Susceptibility   Methicillin resistant staphylococcus aureus - MIC*    CIPROFLOXACIN >=8 RESISTANT Resistant     GENTAMICIN <=0.5 SENSITIVE Sensitive     NITROFURANTOIN <=16 SENSITIVE Sensitive     OXACILLIN >=4 RESISTANT Resistant     TETRACYCLINE <=1 SENSITIVE Sensitive     VANCOMYCIN 1 SENSITIVE Sensitive     TRIMETH/SULFA <=10 SENSITIVE Sensitive     CLINDAMYCIN <=0.25 SENSITIVE Sensitive     RIFAMPIN <=0.5 SENSITIVE Sensitive     Inducible Clindamycin NEGATIVE Sensitive     * 80,000 COLONIES/mL METHICILLIN RESISTANT STAPHYLOCOCCUS AUREUS  SARS CORONAVIRUS 2 (TAT 6-24 HRS) Nasopharyngeal Nasopharyngeal Swab     Status: None   Collection Time: 02/22/20  5:18 PM   Specimen: Nasopharyngeal Swab  Result Value Ref Range Status    SARS Coronavirus 2 NEGATIVE NEGATIVE Final    Comment: (NOTE) SARS-CoV-2 target nucleic acids are NOT DETECTED. The SARS-CoV-2 RNA is generally detectable in upper and lower respiratory specimens during the acute phase of infection. Negative results do not preclude SARS-CoV-2 infection, do not rule out co-infections with other pathogens, and should not be used as the sole basis for treatment or other patient management decisions. Negative results must be combined with clinical observations, patient history, and epidemiological information. The expected result is Negative. Fact Sheet for Patients: SugarRoll.be Fact Sheet for Healthcare Providers: https://www.woods-mathews.com/ This test is not yet approved or cleared by the [place] FDA and  has been authorized for detection and/or diagnosis of SARS-CoV-2 by FDA under an Emergency Use Authorization (EUA). This EUA will remain  in effect (meaning this test can be used) for the duration of the COVID-19 declaration under Section 56 4(b)(1) of the Act, 21 U.S.C. section 360bbb-3(b)(1), unless the authorization is terminated or revoked sooner. Performed at [facility], [place] [address], [place], [place] [address]          Radiology Studies: US RENAL  Result Date: 02/22/2020 CLINICAL DATA:  Acute kidney injury. EXAM: RENAL / URINARY TRACT ULTRASOUND COMPLETE COMPARISON:  01/17/2020 CT abdomen/pelvis. FINDINGS: Right Kidney: Renal measurements: 11.2 x 4.8 x 6.0 cm = volume: 169 mL. Normal renal parenchymal echogenicity and thickness. No hydronephrosis. Multiple simple appearing right renal cysts, largest 7.6 x 5.0 x 5.9 cm in the upper right kidney. Left Kidney: Renal measurements: 10.8 x 6.1 x 5.3 cm = volume: 182 mL. Normal renal parenchymal echogenicity and thickness. No hydronephrosis. Multiple simple appearing left renal cysts, largest 4.2 x 3.3 x 4.7 cm in the lateral lower left  kidney. Bladder: Appears normal for degree of bladder distention. Ureteral jets demonstrated bilaterally in the bladder. Other: Enlarged prostate measuring approximately 6.7 x 4.2 x 5.2 cm. IMPRESSION: 1. No hydronephrosis. 2. Simple bilateral renal cysts. 3. Prostatomegaly. Please see recent 01/17/2020 CT report for further details regarding the prostate. Electronically Signed   By: Ilona Sorrel M.D.   On: 02/22/2020 17:31        Scheduled Meds: . amLODipine  5 mg Oral Daily  . diphenhydrAMINE  25 mg Oral QHS  . heparin injection (subcutaneous)  5,000 Units Subcutaneous Q8H  . nutrition supplement (JUVEN)  1 packet Oral BID BM  . pantoprazole  40 mg Oral Daily  . sodium bicarbonate  650 mg Oral TID  . sodium zirconium cyclosilicate  10 g Oral BID  . tamsulosin  0.4 mg Oral Daily   Continuous Infusions: . sodium chloride  100 mL/hr at 02/23/20 0332     LOS: 0 days    Time spent: 35 minutes    Singleton Hickox Darleen Crocker, DO [facility]  If 7PM-7AM, please contact night-coverage [email or web address] 02/23/2020, 9:05 AM

## 2020-02-24 LAB — BASIC METABOLIC PANEL
Anion gap: 7 (ref 5–15)
BUN: 29 mg/dL — ABNORMAL HIGH (ref 8–23)
CO2: 18 mmol/L — ABNORMAL LOW (ref 22–32)
Calcium: 8.9 mg/dL (ref 8.9–10.3)
Chloride: 111 mmol/L (ref 98–111)
Creatinine, Ser: 1.8 mg/dL — ABNORMAL HIGH (ref 0.61–1.24)
GFR calc Af Amer: 37 mL/min — ABNORMAL LOW (ref >60)
GFR calc non Af Amer: 32 mL/min — ABNORMAL LOW (ref >60)
Glucose, Bld: 85 mg/dL (ref 70–99)
Potassium: 6 mmol/L — ABNORMAL HIGH (ref 3.5–5.1)
Sodium: 136 mmol/L (ref 135–145)

## 2020-02-24 LAB — PREPARE RBC (CROSSMATCH)

## 2020-02-24 LAB — CBC
HCT: 24.1 % — ABNORMAL LOW (ref 39.0–52.0)
Hemoglobin: 7 g/dL — ABNORMAL LOW (ref 13.0–17.0)
MCH: 24.1 pg — ABNORMAL LOW (ref 26.0–34.0)
MCHC: 29 g/dL — ABNORMAL LOW (ref 30.0–36.0)
MCV: 82.8 fL (ref 80.0–100.0)
Platelets: 384 10*3/uL (ref 150–400)
RBC: 2.91 MIL/uL — ABNORMAL LOW (ref 4.22–5.81)
RDW: 15.7 % — ABNORMAL HIGH (ref 11.5–15.5)
WBC: 6 10*3/uL (ref 4.0–10.5)
nRBC: 0 % (ref 0.0–0.2)

## 2020-02-24 LAB — URINE CULTURE: Culture: <10000 — AB

## 2020-02-24 LAB — HEMOGLOBIN AND HEMATOCRIT, BLOOD
HCT: 27.2 % — ABNORMAL LOW (ref 39.0–52.0)
Hemoglobin: 8.3 g/dL — ABNORMAL LOW (ref 13.0–17.0)

## 2020-02-24 MED ORDER — SODIUM POLYSTYRENE SULFONATE 15 GM/60ML PO SUSP
30.0000 g | Freq: Once | ORAL | Status: AC
  Administered 2020-02-24: 30 g via ORAL
  Filled 2020-02-24: qty 120

## 2020-02-24 MED ORDER — SODIUM CHLORIDE 0.9% IV SOLUTION: Freq: Once | INTRAVENOUS | Status: AC

## 2020-02-24 MED ORDER — SODIUM CHLORIDE 0.9 % IV SOLN
510.0000 mg | Freq: Once | INTRAVENOUS | Status: AC
  Administered 2020-02-24: 510 mg via INTRAVENOUS
  Filled 2020-02-24: qty 17

## 2020-02-24 NOTE — Progress Notes (Signed)
PROGRESS NOTE    Tony Holt  [id number] DOB: 1926-12-08 DOA: 02/22/2020 PCP: [name], MD   Brief Narrative:  Per HPI: Tony Chesebro Wilsonis a 86 y.o.malewith a history of atrial fibrillation, hypertension, GERD, cecaladenocarcinoma 2 weeks status post right hemicolectomy with reanastomosis. Patient was seen in follow-up with Dr. Constance Haw 3 days ago and placed on Bactrim due to concerns of UTI, as well as slight wound infection. He was also instructed to increase his intake of oral food and liquidsas his intake was minimal. He may with increasing weakness, fatigue. No palliating or provoking factors. He still having oral intake. Feels like his symptoms are worsening.  Note, he was recently diagnosed with atrial fibrillation and is not currently on anticoagulation as the patient is anemic. He is supposed to follow-up with cardiology.  3/13: Patient presented with some symptomatic AKI and hyperkalemia related to poor oral intake as well as recent Bactrim use.  FeNa reveals intrinsic AKI, and creatinine is improving on IV fluid.  He is maintaining adequate urine output, but will measure strict I's and O's.  Continue current plan.  Renal ultrasound without any acute findings.  Lokelma twice daily to help manage potassium better as well as sodium bicarbonate 3 times daily to help correct acidosis.  Follow a.m. labs.  3/14: Patient noted to have worsening hemoglobin levels with iron deficiency anemia no overt bleeding identified.  Plan for 1 unit PRBC transfusion today as discussed with general surgery.  Follow CBC.  Obtain peripheral smear.  Potassium level still elevated on account of acidosis and will try Kayexalate and follow repeat labs.  Serum creatinine as well as renal function overall appears to be improving.  Continue sodium bicarbonate.  Assessment & Plan:   Principal Problem:   Hyperkalemia Active Problems:   HTN (hypertension)   Acute renal failure (HCC)  IDA (iron deficiency anemia)   Cecal cancer (HCC)   Prostate cancer (HCC)   Atrial fibrillation, chronic (HCC)   AKI (acute kidney injury) (HCC)   Symptomatic, nonoliguric AKI on CKD stage IIIb with hyperkalemia and mild metabolic acidosis -Appears to have intrinsic AKI with FeNa of 1.9% -Renal ultrasound without any acute findings of obstruction -Hold further IV fluid on account of transfusion planned for today -Discontinue Lokelma and try Kayexalate -Sodium bicarbonate 3 times daily as ordered -Urine output of 975 noted in the last 24 hours -Avoid nephrotoxic agents and monitor with repeat labs a.m.  Symptomatic anemia-iron deficiency -Feraheme to be given today -Peripheral smear to check for hemolysis -Transfuse 1 unit PRBC and recheck CBC in a.m. -No signs of overt bleeding noted  Recent MRSA UTI -Repeat UA appears clear, but repeat culture pending -Would recommend Macrobid for further treatment if indicated -He is currently asymptomatic  Recent hemicolectomy and reanastomosis in setting of colon adenocarcinoma -Per general surgery  History of prostate cancer -Prostatomegaly noted on renal ultrasound, but no signs of obstruction -Continue tamsulosin  Atrial fibrillation -Currently rate controlled -Not on anticoagulation due to chronic anemia  Hypertension-controlled -Continue amlodipine   DVT prophylaxis:  Changed to SCDs for now  Code Status: Full Family Communication: Plan to call daughter Disposition Plan:  Continue correction of potassium as well as acidosis.  Hold further IV fluid and plan for 1 unit PRBC transfusion.  Follow labs in a.m.   Consultants:   General surgery  Procedures:   None  Antimicrobials:   None  Subjective: Patient seen and evaluated today with no new acute complaints or concerns. No acute  concerns or events noted overnight.  He still continues to feel somewhat weak.  Denies any overt bleeding.  Tolerating diet.  He  has had good urine output.  Objective: Vitals:   02/23/20 1038 02/23/20 1300 02/23/20 2103 02/24/20 0501  BP: 127/71 129/69 119/61 124/71  Pulse:  86 83 88  Resp:  19 19 18   Temp:  98.3 F (36.8 C) 98.6 F (37 C) 98 F (36.7 C)  TempSrc:  Oral Oral   SpO2:  98% 97% 99%  Weight:      Height:        Intake/Output Summary (Last 24 hours) at 02/24/2020 0926 Last data filed at 02/24/2020 0849 Gross per 24 hour  Intake 2463.97 ml  Output 1975 ml  Net 488.97 ml   Filed Weights   02/22/20 1459 02/22/20 2015  Weight: 57.2 kg 57.9 kg    Examination:  General exam: Appears calm and comfortable  Respiratory system: Clear to auscultation. Respiratory effort normal. Cardiovascular system: S1 & S2 heard, RRR. No JVD, murmurs, rubs, gallops or clicks. No pedal edema. Gastrointestinal system: Abdominal binder is present. No organomegaly or masses felt. Normal bowel sounds heard. Central nervous system: Alert and oriented. No focal neurological deficits. Extremities: Symmetric 5 x 5 power. Skin: No rashes, lesions or ulcers Psychiatry: Judgement and insight appear normal. Mood & affect appropriate.     Data Reviewed: I have personally reviewed following labs and imaging studies  CBC: Recent Labs  Lab 02/19/20 1257 02/22/20 1525 02/24/20 0618  WBC 10.7* 8.6 6.0  NEUTROABS 8.7* 7.5  --   HGB 9.0* 8.3* 7.0*  HCT 30.4* 26.8* 24.1*  MCV 84.9 81.0 82.8  PLT 430* 417* 0000000   Basic Metabolic Panel: Recent Labs  Lab 02/19/20 1257 02/22/20 1525 02/22/20 1949 02/23/20 0642 02/24/20 0618  NA 134* 134*  --  138 136  K 5.5* 6.9* 5.4* 5.9* 6.0*  CL 102 104  --  110 111  CO2 21* 21*  --  21* 18*  GLUCOSE 97 134*  --  91 85  BUN 28* 41*  --  32* 29*  CREATININE 1.95* 2.90*  --  2.33* 1.80*  CALCIUM 9.2 9.1  --  8.9 8.9   GFR: Estimated Creatinine Clearance: 21.9 mL/min (A) (by C-G formula based on SCr of 1.8 mg/dL (H)). Liver Function Tests: Recent Labs  Lab 02/22/20 1525    AST 17  ALT 14  ALKPHOS 65  BILITOT 0.4  PROT 7.0  ALBUMIN 3.8   No results for input(s): LIPASE, AMYLASE in the last 168 hours. No results for input(s): AMMONIA in the last 168 hours. Coagulation Profile: No results for input(s): INR, PROTIME in the last 168 hours. Cardiac Enzymes: No results for input(s): CKTOTAL, CKMB, CKMBINDEX, TROPONINI in the last 168 hours. BNP (last 3 results) No results for input(s): PROBNP in the last 8760 hours. HbA1C: No results for input(s): HGBA1C in the last 72 hours. CBG: No results for input(s): GLUCAP in the last 168 hours. Lipid Profile: No results for input(s): CHOL, HDL, LDLCALC, TRIG, CHOLHDL, LDLDIRECT in the last 72 hours. Thyroid Function Tests: Recent Labs    02/23/20 0755  TSH 1.957   Anemia Panel: Recent Labs    02/23/20 0643 02/23/20 1125  VITAMINB12 247  --   FOLATE  --  23.5  FERRITIN 19*  --   TIBC 358  --   IRON 14*  --   RETICCTPCT 1.1  --    Sepsis Labs: No results  for input(s): PROCALCITON, LATICACIDVEN in the last 168 hours.  Recent Results (from the past 240 hour(s))  Culture, Urine     Status: Abnormal   Collection Time: 02/19/20 12:57 PM   Specimen: Urine, Clean Catch  Result Value Ref Range Status   Specimen Description   Final    URINE, CLEAN CATCH Performed at [facility], [address], [place], [place] [address]    Special Requests   Final    NONE Performed at [facility], [address] [place], [place] [address]    Culture (A)  Final    80,000 COLONIES/mL METHICILLIN RESISTANT STAPHYLOCOCCUS AUREUS   Report Status 02/21/2020 FINAL  Final   Organism ID, Bacteria METHICILLIN RESISTANT STAPHYLOCOCCUS AUREUS (A)  Final      Susceptibility   Methicillin resistant staphylococcus aureus - MIC*    CIPROFLOXACIN >=8 RESISTANT Resistant     GENTAMICIN <=0.5 SENSITIVE Sensitive     NITROFURANTOIN <=16 SENSITIVE Sensitive     OXACILLIN >=4 RESISTANT Resistant     TETRACYCLINE <=1 SENSITIVE  Sensitive     VANCOMYCIN 1 SENSITIVE Sensitive     TRIMETH/SULFA <=10 SENSITIVE Sensitive     CLINDAMYCIN <=0.25 SENSITIVE Sensitive     RIFAMPIN <=0.5 SENSITIVE Sensitive     Inducible Clindamycin NEGATIVE Sensitive     * 80,000 COLONIES/mL METHICILLIN RESISTANT STAPHYLOCOCCUS AUREUS  SARS CORONAVIRUS 2 (TAT 6-24 HRS) Nasopharyngeal Nasopharyngeal Swab     Status: None   Collection Time: 02/22/20  5:18 PM   Specimen: Nasopharyngeal Swab  Result Value Ref Range Status   SARS Coronavirus 2 NEGATIVE NEGATIVE Final    Comment: (NOTE) SARS-CoV-2 target nucleic acids are NOT DETECTED. The SARS-CoV-2 RNA is generally detectable in upper and lower respiratory specimens during the acute phase of infection. Negative results do not preclude SARS-CoV-2 infection, do not rule out co-infections with other pathogens, and should not be used as the sole basis for treatment or other patient management decisions. Negative results must be combined with clinical observations, patient history, and epidemiological information. The expected result is Negative. Fact Sheet for Patients: SugarRoll.be Fact Sheet for Healthcare Providers: https://www.woods-mathews.com/ This test is not yet approved or cleared by the [place] FDA and  has been authorized for detection and/or diagnosis of SARS-CoV-2 by FDA under an Emergency Use Authorization (EUA). This EUA will remain  in effect (meaning this test can be used) for the duration of the COVID-19 declaration under Section 56 4(b)(1) of the Act, 21 U.S.C. section 360bbb-3(b)(1), unless the authorization is terminated or revoked sooner. Performed at [facility], [place] [address], [place], [place] [address]          Radiology Studies: US RENAL  Result Date: 02/22/2020 CLINICAL DATA:  Acute kidney injury. EXAM: RENAL / URINARY TRACT ULTRASOUND COMPLETE COMPARISON:  01/17/2020 CT abdomen/pelvis. FINDINGS:  Right Kidney: Renal measurements: 11.2 x 4.8 x 6.0 cm = volume: 169 mL. Normal renal parenchymal echogenicity and thickness. No hydronephrosis. Multiple simple appearing right renal cysts, largest 7.6 x 5.0 x 5.9 cm in the upper right kidney. Left Kidney: Renal measurements: 10.8 x 6.1 x 5.3 cm = volume: 182 mL. Normal renal parenchymal echogenicity and thickness. No hydronephrosis. Multiple simple appearing left renal cysts, largest 4.2 x 3.3 x 4.7 cm in the lateral lower left kidney. Bladder: Appears normal for degree of bladder distention. Ureteral jets demonstrated bilaterally in the bladder. Other: Enlarged prostate measuring approximately 6.7 x 4.2 x 5.2 cm. IMPRESSION: 1. No hydronephrosis. 2. Simple  bilateral renal cysts. 3. Prostatomegaly. Please see recent 01/17/2020 CT report for further details regarding the prostate. Electronically Signed   By: Ilona Sorrel M.D.   On: 02/22/2020 17:31        Scheduled Meds: . sodium chloride   Intravenous Once  . acetaminophen  500 mg Oral QHS   And  . diphenhydrAMINE  25 mg Oral QHS  . amLODipine  5 mg Oral Daily  . heparin injection (subcutaneous)  5,000 Units Subcutaneous Q8H  . nutrition supplement (JUVEN)  1 packet Oral BID BM  . pantoprazole  40 mg Oral Daily  . sodium bicarbonate  650 mg Oral TID  . sodium polystyrene  30 g Oral Once  . tamsulosin  0.4 mg Oral Daily   Continuous Infusions:   LOS: 1 day    Time spent: 35 minutes    Alsha Meland Darleen Crocker, DO [facility]  If 7PM-7AM, please contact night-coverage [email or web address] 02/24/2020, 9:26 AM

## 2020-02-24 NOTE — Progress Notes (Signed)
[facility]  Tolerating diet. K remains elevated, Cr coming down. Did not ambulate yesterday and has his walker.   BP 135/85   Pulse 84   Temp 97.9 F (36.6 C) (Oral)   Resp 20   Ht 5\' 8"  (1.727 m)   Wt 57.9 kg   SpO2 99%   BMI 19.41 kg/m  NAD Normal work breathing MAE  Kayexalate Diet as tolerated Blood today for low H&H  No overt signs of bleeding Ambulate patient in hall today each shift    Discussed with Dr. Manuella Ghazi.   Curlene Labrum, MD [facility] [address] [place], [place] [address] [phone] (office)

## 2020-02-25 LAB — BASIC METABOLIC PANEL
Anion gap: 8 (ref 5–15)
BUN: 28 mg/dL — ABNORMAL HIGH (ref 8–23)
CO2: 21 mmol/L — ABNORMAL LOW (ref 22–32)
Calcium: 9.1 mg/dL (ref 8.9–10.3)
Chloride: 110 mmol/L (ref 98–111)
Creatinine, Ser: 1.85 mg/dL — ABNORMAL HIGH (ref 0.61–1.24)
GFR calc Af Amer: 36 mL/min — ABNORMAL LOW (ref >60)
GFR calc non Af Amer: 31 mL/min — ABNORMAL LOW (ref >60)
Glucose, Bld: 83 mg/dL (ref 70–99)
Potassium: 4.9 mmol/L (ref 3.5–5.1)
Sodium: 139 mmol/L (ref 135–145)

## 2020-02-25 LAB — PATHOLOGIST SMEAR REVIEW

## 2020-02-25 LAB — CBC
HCT: 26.4 % — ABNORMAL LOW (ref 39.0–52.0)
Hemoglobin: 8.1 g/dL — ABNORMAL LOW (ref 13.0–17.0)
MCH: 25 pg — ABNORMAL LOW (ref 26.0–34.0)
MCHC: 30.7 g/dL (ref 30.0–36.0)
MCV: 81.5 fL (ref 80.0–100.0)
Platelets: 375 10*3/uL (ref 150–400)
RBC: 3.24 MIL/uL — ABNORMAL LOW (ref 4.22–5.81)
RDW: 15.8 % — ABNORMAL HIGH (ref 11.5–15.5)
WBC: 5.5 10*3/uL (ref 4.0–10.5)
nRBC: 0 % (ref 0.0–0.2)

## 2020-02-25 LAB — TYPE AND SCREEN
ABO/RH(D): A POS
Antibody Screen: NEGATIVE
Unit division: 0

## 2020-02-25 LAB — BPAM RBC
Blood Product Expiration Date: 202104012359
ISSUE DATE / TIME: 202103141205
Unit Type and Rh: 6200

## 2020-02-25 MED ORDER — AMLODIPINE BESYLATE 5 MG PO TABS: 5.0000 mg | ORAL_TABLET | Freq: Every day | ORAL | 0 refills | Status: DC

## 2020-02-25 MED ORDER — SODIUM POLYSTYRENE SULFONATE 15 GM/60ML PO SUSP
15.0000 g | Freq: Once | ORAL | Status: AC
  Administered 2020-02-25: 15 g via ORAL
  Filled 2020-02-25: qty 60

## 2020-02-25 MED ORDER — SODIUM BICARBONATE 650 MG PO TABS: 650.0000 mg | ORAL_TABLET | Freq: Every day | ORAL | 0 refills | Status: AC

## 2020-02-25 NOTE — Discharge Summary (Signed)
Physician Discharge Summary  Tony Holt [id number] DOB: 10-15-1926 DOA: 02/22/2020  PCP: Tony Andrea, MD  Admit date: 02/22/2020  Discharge date: 02/25/2020  Admitted From:Home  Disposition:  Home  Recommendations for Outpatient Follow-up:  1. Follow up with PCP in 1-2 weeks and will also need repeat iron studies and CBC in the near future 2. Follow-up with Dr. Constance Haw with repeat BMP in 1 week as scheduled for 3/23 3. Continue on sodium bicarbonate once a day for the next 7 days 4. Continue on iron supplementation with Flintstones chewables and continue stool softener  Home Health: None  Equipment/Devices: Has home walker  Discharge Condition: Stable  CODE STATUS: Full  Diet recommendation: Heart Healthy  Brief/Interim Summary: Per HPI: Tony Bossen Wilsonis a 86 y.o.malewith a history of atrial fibrillation, hypertension, GERD, cecaladenocarcinoma 2 weeks status post right hemicolectomy with reanastomosis. Patient was seen in follow-up with Dr. Constance Haw 3 days ago and placed on Bactrim due to concerns of UTI, as well as slight wound infection. He was also instructed to increase his intake of oral food and liquidsas his intake was minimal. He may with increasing weakness, fatigue. No palliating or provoking factors. He still having oral intake. Feels like his symptoms are worsening.  Note, he was recently diagnosed with atrial fibrillation and is not currently on anticoagulation as the patient is anemic. He is supposed to follow-up with cardiology.  3/13:Patient presented with some symptomatic AKI and hyperkalemia related to poor oral intake as well as recent Bactrim use. FeNa reveals intrinsic AKI, and creatinine is improving on IV fluid. He is maintaining adequate urine output, but will measure strict I's and O's. Continue current plan. Renal ultrasound without any acute findings. Lokelma twice daily to help manage potassium better as well as sodium  bicarbonate 3 times daily to help correct acidosis. Follow a.m. labs.  3/14: Patient noted to have worsening hemoglobin levels with iron deficiency anemia no overt bleeding identified.  Plan for 1 unit PRBC transfusion today as discussed with general surgery.  Follow CBC.  Obtain peripheral smear.  Potassium level still elevated on account of acidosis and will try Kayexalate and follow repeat labs.  Serum creatinine as well as renal function overall appears to be improving.  Continue sodium bicarbonate.  3/15: Patient is feeling much better after PRBC transfusion and denies any further weakness.  He has been able to ambulate with the assistance of his walker with no acute events throughout the course of this admission.  His creatinine remains improved at a level of 1.8 and his acidosis is also improving on sodium bicarbonate.  He will remain on this medication just once a day for another week with repeat BMP planned for 1 week.  Additionally, potassium levels have improved to 4.9 this morning with the use of Kayexalate.  He will be given 1 more dose of Kayexalate 15 g prior to discharge and again will have the repeat BMP done in 1 week.  Repeat urine cultures with insignificant growth noted and he has remained asymptomatic.  He maintains great urine output and is otherwise stable for discharge.  Discharge Diagnoses:  Principal Problem:   Hyperkalemia Active Problems:   HTN (hypertension)   Acute renal failure (HCC)   IDA (iron deficiency anemia)   Cecal cancer (HCC)   Prostate cancer (HCC)   Atrial fibrillation, chronic (HCC)   AKI (acute kidney injury) ([place])  Principal discharge diagnosis: Symptomatic nonoliguric AKI on CKD stage IIIb with associated hyperkalemia and mild metabolic acidosis  related to recent Bactrim use.  Symptomatic anemia with iron deficiency status post 1 unit PRBC transfusion.  Discharge Instructions  Discharge Instructions    Diet - low sodium heart healthy   Complete  by: As directed    Increase activity slowly   Complete by: As directed      Allergies as of 02/25/2020   No Known Allergies     Medication List    STOP taking these medications   sulfamethoxazole-trimethoprim 800-160 MG tablet Commonly known as: BACTRIM DS     TAKE these medications   amLODipine 5 MG tablet Commonly known as: NORVASC Take 1 tablet (5 mg total) by mouth daily.   diphenhydramine-acetaminophen 25-500 MG Tabs tablet Commonly known as: TYLENOL PM Take 2 tablets by mouth at bedtime.   docusate sodium 100 MG capsule Commonly known as: COLACE Take 1 capsule (100 mg total) by mouth 2 (two) times daily as needed for mild constipation.   FLINTSTONES MULTIVITAMIN PO Take 1 tablet by mouth daily.   HYDROcodone-acetaminophen 5-325 MG tablet Commonly known as: NORCO/VICODIN Take 1 tablet by mouth every 12 (twelve) hours as needed for moderate pain.   Ensure Take 237 mLs by mouth 3 (three) times daily between meals.   nutrition supplement (JUVEN) Pack Take 1 packet by mouth 2 (two) times daily between meals.   omeprazole 20 MG capsule Commonly known as: PRILOSEC Take 20 mg by mouth 2 (two) times daily.   ondansetron 4 MG disintegrating tablet Commonly known as: ZOFRAN-ODT Take 1 tablet (4 mg total) by mouth every 6 (six) hours as needed for nausea.   sodium bicarbonate 650 MG tablet Take 1 tablet (650 mg total) by mouth daily for 7 days.   tamsulosin 0.4 MG Caps capsule Commonly known as: FLOMAX Take 1 capsule (0.4 mg total) by mouth daily.      Follow-up Information    Virl Cagey, MD Follow up on 03/04/2020.   Specialty: General Surgery Why: Follow up to check on wound and see how intake is going. Get labs before coming to clinic (don't leave without getting labs, call clinic if they don't see orders).  If you do not have a time before end of the week call the office to get time. Contact information: [address] [facility]  [address] [phone]        Tony Andrea, MD Follow up in 2 week(s).   Specialty: Family Medicine Contact information: [address] [place] [address] [phone]          No Known Allergies  Consultations:  General surgery-Dr. Constance Haw   Procedures/Studies: US RENAL  Result Date: 02/22/2020 CLINICAL DATA:  Acute kidney injury. EXAM: RENAL / URINARY TRACT ULTRASOUND COMPLETE COMPARISON:  01/17/2020 CT abdomen/pelvis. FINDINGS: Right Kidney: Renal measurements: 11.2 x 4.8 x 6.0 cm = volume: 169 mL. Normal renal parenchymal echogenicity and thickness. No hydronephrosis. Multiple simple appearing right renal cysts, largest 7.6 x 5.0 x 5.9 cm in the upper right kidney. Left Kidney: Renal measurements: 10.8 x 6.1 x 5.3 cm = volume: 182 mL. Normal renal parenchymal echogenicity and thickness. No hydronephrosis. Multiple simple appearing left renal cysts, largest 4.2 x 3.3 x 4.7 cm in the lateral lower left kidney. Bladder: Appears normal for degree of bladder distention. Ureteral jets demonstrated bilaterally in the bladder. Other: Enlarged prostate measuring approximately 6.7 x 4.2 x 5.2 cm. IMPRESSION: 1. No hydronephrosis. 2. Simple bilateral renal cysts. 3. Prostatomegaly. Please see recent 01/17/2020 CT report for further details regarding  the prostate. Electronically Signed   By: Ilona Sorrel M.D.   On: 02/22/2020 17:31     Discharge Exam: Vitals:   02/24/20 2126 02/25/20 0607  BP: (!) 141/82 129/77  Pulse: 74 72  Resp: 20 20  Temp: 98.2 F (36.8 C) 98 F (36.7 C)  SpO2: 97% 98%   Vitals:   02/24/20 1225 02/24/20 1455 02/24/20 2126 02/25/20 0607  BP: 135/85 136/85 (!) 141/82 129/77  Pulse: 84 82 74 72  Resp: 20 20 20 20   Temp: 97.9 F (36.6 C) 98.6 F (37 C) 98.2 F (36.8 C) 98 F (36.7 C)  TempSrc: Oral Oral Oral Oral  SpO2: 99% 96% 97% 98%  Weight:      Height:        General: Pt is alert, awake, not in acute distress Cardiovascular: RRR, S1/S2  +, no rubs, no gallops Respiratory: CTA bilaterally, no wheezing, no rhonchi Abdominal: Soft, NT, ND, bowel sounds + Extremities: no edema, no cyanosis    The results of significant diagnostics from this hospitalization (including imaging, microbiology, ancillary and laboratory) are listed below for reference.     Microbiology: Recent Results (from the past 240 hour(s))  Culture, Urine     Status: Abnormal   Collection Time: 02/19/20 12:57 PM   Specimen: Urine, Clean Catch  Result Value Ref Range Status   Specimen Description   Final    URINE, CLEAN CATCH Performed at [facility], [address] [place], [place] [address]    Special Requests   Final    NONE Performed at [facility], [address] [place], [place] [address]    Culture (A)  Final    80,000 COLONIES/mL METHICILLIN RESISTANT STAPHYLOCOCCUS AUREUS   Report Status 02/21/2020 FINAL  Final   Organism ID, Bacteria METHICILLIN RESISTANT STAPHYLOCOCCUS AUREUS (A)  Final      Susceptibility   Methicillin resistant staphylococcus aureus - MIC*    CIPROFLOXACIN >=8 RESISTANT Resistant     GENTAMICIN <=0.5 SENSITIVE Sensitive     NITROFURANTOIN <=16 SENSITIVE Sensitive     OXACILLIN >=4 RESISTANT Resistant     TETRACYCLINE <=1 SENSITIVE Sensitive     VANCOMYCIN 1 SENSITIVE Sensitive     TRIMETH/SULFA <=10 SENSITIVE Sensitive     CLINDAMYCIN <=0.25 SENSITIVE Sensitive     RIFAMPIN <=0.5 SENSITIVE Sensitive     Inducible Clindamycin NEGATIVE Sensitive     * 80,000 COLONIES/mL METHICILLIN RESISTANT STAPHYLOCOCCUS AUREUS  SARS CORONAVIRUS 2 (TAT 6-24 HRS) Nasopharyngeal Nasopharyngeal Swab     Status: None   Collection Time: 02/22/20  5:18 PM   Specimen: Nasopharyngeal Swab  Result Value Ref Range Status   SARS Coronavirus 2 NEGATIVE NEGATIVE Final    Comment: (NOTE) SARS-CoV-2 target nucleic acids are NOT DETECTED. The SARS-CoV-2 RNA is generally detectable in upper and lower respiratory specimens during the acute  phase of infection. Negative results do not preclude SARS-CoV-2 infection, do not rule out co-infections with other pathogens, and should not be used as the sole basis for treatment or other patient management decisions. Negative results must be combined with clinical observations, patient history, and epidemiological information. The expected result is Negative. Fact Sheet for Patients: SugarRoll.be Fact Sheet for Healthcare Providers: https://www.woods-mathews.com/ This test is not yet approved or cleared by the [place] FDA and  has been authorized for detection and/or diagnosis of SARS-CoV-2 by FDA under an Emergency Use Authorization (EUA). This EUA will remain  in effect (meaning this test can be used) for the  duration of the COVID-19 declaration under Section 56 4(b)(1) of the Act, 21 U.S.C. section 360bbb-3(b)(1), unless the authorization is terminated or revoked sooner. Performed at [facility], [place] [address], [place], [place] [address]   Urine culture     Status: Abnormal   Collection Time: 02/22/20  7:30 PM   Specimen: Urine, Clean Catch  Result Value Ref Range Status   Specimen Description   Final    URINE, CLEAN CATCH Performed at [facility], [address], [place], [place] [address]    Special Requests   Final    NONE Performed at [facility], [address], [place], [place] [address]    Culture (A)  Final    <10,000 COLONIES/mL INSIGNIFICANT GROWTH Performed at [facility] [address], [place], [place] [address]    Report Status 02/24/2020 FINAL  Final     Labs: BNP (last 3 results) No results for input(s): BNP in the last 8760 hours. Basic Metabolic Panel: Recent Labs  Lab 02/19/20 1257 02/19/20 1257 02/22/20 1525 02/22/20 1949 02/23/20 0642 02/24/20 0618 02/25/20 0439  NA 134*  --  134*  --  138 136 139  K 5.5*   < > 6.9* 5.4* 5.9* 6.0* 4.9  CL 102  --  104  --  110 111 110  CO2  21*  --  21*  --  21* 18* 21*  GLUCOSE 97  --  134*  --  91 85 83  BUN 28*  --  41*  --  32* 29* 28*  CREATININE 1.95*  --  2.90*  --  2.33* 1.80* 1.85*  CALCIUM 9.2  --  9.1  --  8.9 8.9 9.1   < > = values in this interval not displayed.   Liver Function Tests: Recent Labs  Lab 02/22/20 1525  AST 17  ALT 14  ALKPHOS 65  BILITOT 0.4  PROT 7.0  ALBUMIN 3.8   No results for input(s): LIPASE, AMYLASE in the last 168 hours. No results for input(s): AMMONIA in the last 168 hours. CBC: Recent Labs  Lab 02/19/20 1257 02/22/20 1525 02/24/20 0618 02/24/20 2028 02/25/20 0439  WBC 10.7* 8.6 6.0  --  5.5  NEUTROABS 8.7* 7.5  --   --   --   HGB 9.0* 8.3* 7.0* 8.3* 8.1*  HCT 30.4* 26.8* 24.1* 27.2* 26.4*  MCV 84.9 81.0 82.8  --  81.5  PLT 430* 417* 384  --  375   Cardiac Enzymes: No results for input(s): CKTOTAL, CKMB, CKMBINDEX, TROPONINI in the last 168 hours. BNP: Invalid input(s): POCBNP CBG: No results for input(s): GLUCAP in the last 168 hours. D-Dimer No results for input(s): DDIMER in the last 72 hours. Hgb A1c No results for input(s): HGBA1C in the last 72 hours. Lipid Profile No results for input(s): CHOL, HDL, LDLCALC, TRIG, CHOLHDL, LDLDIRECT in the last 72 hours. Thyroid function studies Recent Labs    02/23/20 0755  TSH 1.957   Anemia work up Recent Labs    02/23/20 0643 02/23/20 1125  VITAMINB12 247  --   FOLATE  --  23.5  FERRITIN 19*  --   TIBC 358  --   IRON 14*  --   RETICCTPCT 1.1  --    Urinalysis    Component Value Date/Time   COLORURINE STRAW (A) 02/22/2020 1929   APPEARANCEUR CLEAR 02/22/2020 1929   LABSPEC 1.008 02/22/2020 1929   PHURINE 6.0 02/22/2020 1929   GLUCOSEU NEGATIVE 02/22/2020  [place] 02/22/2020 [place] NEGATIVE 02/22/2020 [place] NEGATIVE 02/22/2020 1929   PROTEINUR NEGATIVE 02/22/2020 1929   UROBILINOGEN 0.2 03/23/2012 2041   NITRITE NEGATIVE 02/22/2020 1929   LEUKOCYTESUR NEGATIVE  02/22/2020 1929   Sepsis Labs Invalid input(s): PROCALCITONIN,  WBC,  LACTICIDVEN Microbiology Recent Results (from the past 240 hour(s))  Culture, Urine     Status: Abnormal   Collection Time: 02/19/20 12:57 PM   Specimen: Urine, Clean Catch  Result Value Ref Range Status   Specimen Description   Final    URINE, CLEAN CATCH Performed at [facility], [address], [place], [place] [address]    Special Requests   Final    NONE Performed at [facility], [address] [place], [place] [address]    Culture (A)  Final    80,000 COLONIES/mL METHICILLIN RESISTANT STAPHYLOCOCCUS AUREUS   Report Status 02/21/2020 FINAL  Final   Organism ID, Bacteria METHICILLIN RESISTANT STAPHYLOCOCCUS AUREUS (A)  Final      Susceptibility   Methicillin resistant staphylococcus aureus - MIC*    CIPROFLOXACIN >=8 RESISTANT Resistant     GENTAMICIN <=0.5 SENSITIVE Sensitive     NITROFURANTOIN <=16 SENSITIVE Sensitive     OXACILLIN >=4 RESISTANT Resistant     TETRACYCLINE <=1 SENSITIVE Sensitive     VANCOMYCIN 1 SENSITIVE Sensitive     TRIMETH/SULFA <=10 SENSITIVE Sensitive     CLINDAMYCIN <=0.25 SENSITIVE Sensitive     RIFAMPIN <=0.5 SENSITIVE Sensitive     Inducible Clindamycin NEGATIVE Sensitive     * 80,000 COLONIES/mL METHICILLIN RESISTANT STAPHYLOCOCCUS AUREUS  SARS CORONAVIRUS 2 (TAT 6-24 HRS) Nasopharyngeal Nasopharyngeal Swab     Status: None   Collection Time: 02/22/20  5:18 PM   Specimen: Nasopharyngeal Swab  Result Value Ref Range Status   SARS Coronavirus 2 NEGATIVE NEGATIVE Final    Comment: (NOTE) SARS-CoV-2 target nucleic acids are NOT DETECTED. The SARS-CoV-2 RNA is generally detectable in upper and lower respiratory specimens during the acute phase of infection. Negative results do not preclude SARS-CoV-2 infection, do not rule out co-infections with other pathogens, and should not be used as the sole basis for treatment or other patient management decisions. Negative results  must be combined with clinical observations, patient history, and epidemiological information. The expected result is Negative. Fact Sheet for Patients: SugarRoll.be Fact Sheet for Healthcare Providers: https://www.woods-mathews.com/ This test is not yet approved or cleared by the [place] FDA and  has been authorized for detection and/or diagnosis of SARS-CoV-2 by FDA under an Emergency Use Authorization (EUA). This EUA will remain  in effect (meaning this test can be used) for the duration of the COVID-19 declaration under Section 56 4(b)(1) of the Act, 21 U.S.C. section 360bbb-3(b)(1), unless the authorization is terminated or revoked sooner. Performed at [facility], [place] [address], [place], [place] [address]   Urine culture     Status: Abnormal   Collection Time: 02/22/20  7:30 PM   Specimen: Urine, Clean Catch  Result Value Ref Range Status   Specimen Description   Final    URINE, CLEAN CATCH Performed at [facility], [address], [place], [place] [address]    Special Requests   Final    NONE Performed at [facility], [address], [place], [place] [address]    Culture (A)  Final    <10,000 COLONIES/mL INSIGNIFICANT GROWTH Performed at [facility] [address], [place], [place] [address]  Report Status 02/24/2020 FINAL  Final     Time coordinating discharge:35 minutes  SIGNED:   Rodena Goldmann, DO [facility] 02/25/2020, 9:07 AM  If 7PM-7AM, please contact night-coverage [email or web address]

## 2020-02-25 NOTE — Progress Notes (Signed)
Nsg Discharge Note  Admit Date:  02/22/2020 Discharge date: 02/25/2020   Tony Holt to be D/C'd Home per MD order.  AVS completed.  Patient's daughter  able to verbalize understanding.  Discharge Medication: Allergies as of 02/25/2020   No Known Allergies     Medication List    STOP taking these medications   sulfamethoxazole-trimethoprim 800-160 MG tablet Commonly known as: BACTRIM DS     TAKE these medications   amLODipine 5 MG tablet Commonly known as: NORVASC Take 1 tablet (5 mg total) by mouth daily.   diphenhydramine-acetaminophen 25-500 MG Tabs tablet Commonly known as: TYLENOL PM Take 2 tablets by mouth at bedtime.   docusate sodium 100 MG capsule Commonly known as: COLACE Take 1 capsule (100 mg total) by mouth 2 (two) times daily as needed for mild constipation.   FLINTSTONES MULTIVITAMIN PO Take 1 tablet by mouth daily.   HYDROcodone-acetaminophen 5-325 MG tablet Commonly known as: NORCO/VICODIN Take 1 tablet by mouth every 12 (twelve) hours as needed for moderate pain.   Ensure Take 237 mLs by mouth 3 (three) times daily between meals.   nutrition supplement (JUVEN) Pack Take 1 packet by mouth 2 (two) times daily between meals.   omeprazole 20 MG capsule Commonly known as: PRILOSEC Take 20 mg by mouth 2 (two) times daily.   ondansetron 4 MG disintegrating tablet Commonly known as: ZOFRAN-ODT Take 1 tablet (4 mg total) by mouth every 6 (six) hours as needed for nausea.   sodium bicarbonate 650 MG tablet Take 1 tablet (650 mg total) by mouth daily for 7 days.   tamsulosin 0.4 MG Caps capsule Commonly known as: FLOMAX Take 1 capsule (0.4 mg total) by mouth daily.       Discharge Assessment: Vitals:   02/24/20 2126 02/25/20 0607  BP: (!) 141/82 129/77  Pulse: 74 72  Resp: 20 20  Temp: 98.2 F (36.8 C) 98 F (36.7 C)  SpO2: 97% 98%   Skin clean, dry and intact without evidence of skin break down, no evidence of skin tears noted. IV  catheter discontinued intact. Site without signs and symptoms of complications - no redness or edema noted at insertion site, patient denies c/o pain - only slight tenderness at site.  Dressing with slight pressure applied.  D/c Instructions-Education: Discharge instructions given to patient's daughter with verbalized understanding. D/c education completed with patient's daughter  including follow up instructions, medication list, d/c activities limitations if indicated, with other d/c instructions as indicated by MD - patient's daughter able to verbalize understanding, all questions fully answered. Patient instructed to return to ED, call 911, or call MD for any changes in condition.  Patient escorted via [place], and D/C home via private auto.  Dane Bloch Loletha Grayer, RN 02/25/2020 10:19 AM

## 2020-02-26 ENCOUNTER — Ambulatory Visit: Payer: Self-pay | Admitting: General Surgery

## 2020-02-27 ENCOUNTER — Other Ambulatory Visit: Payer: Self-pay

## 2020-02-27 NOTE — Patient Outreach (Signed)
[facility] [place] [facility]) Care Management  02/27/2020  GURLEY SCHUYLER 1926/08/01 [id number]     Transition of Care Referral  Referral Date: 02/27/2020 Referral Source: [facility] Discharge Report Date of Discharge: 02/25/2020 Facility: [place]    Referral received. Transition of care calls being completed via EMMI-automated calls. RN CM will outreach patient for any red flags received.     Plan: RN CM will close case at this time.    Enzo Montgomery, RN,BSN,CCM [place] Management Telephonic Care Management Coordinator Direct Phone: [phone] Toll Free: [phone] Fax: [phone]

## 2020-02-29 ENCOUNTER — Other Ambulatory Visit: Payer: Self-pay

## 2020-02-29 ENCOUNTER — Encounter (HOSPITAL_COMMUNITY): Payer: Self-pay | Admitting: *Deleted

## 2020-03-03 ENCOUNTER — Other Ambulatory Visit: Payer: Self-pay

## 2020-03-03 ENCOUNTER — Inpatient Hospital Stay (HOSPITAL_COMMUNITY): Payer: Medicare HMO | Attending: Hematology | Admitting: Hematology

## 2020-03-03 ENCOUNTER — Encounter (HOSPITAL_COMMUNITY): Payer: Self-pay | Admitting: Hematology

## 2020-03-03 ENCOUNTER — Inpatient Hospital Stay (HOSPITAL_COMMUNITY): Payer: Medicare HMO

## 2020-03-03 ENCOUNTER — Other Ambulatory Visit (HOSPITAL_COMMUNITY)
Admission: RE | Admit: 2020-03-03 | Discharge: 2020-03-03 | Disposition: A | Discharge status: Home / Self Care | Payer: Medicare HMO | Source: Ambulatory Visit | Attending: General Surgery | Admitting: General Surgery

## 2020-03-03 DIAGNOSIS — E875 Hyperkalemia: Secondary | ICD-10-CM | POA: Insufficient documentation

## 2020-03-03 DIAGNOSIS — E611 Iron deficiency: Secondary | ICD-10-CM | POA: Diagnosis not present

## 2020-03-03 DIAGNOSIS — Z8249 Family history of ischemic heart disease and other diseases of the circulatory system: Secondary | ICD-10-CM | POA: Diagnosis not present

## 2020-03-03 DIAGNOSIS — Z87891 Personal history of nicotine dependence: Secondary | ICD-10-CM | POA: Insufficient documentation

## 2020-03-03 DIAGNOSIS — I4891 Unspecified atrial fibrillation: Secondary | ICD-10-CM | POA: Diagnosis not present

## 2020-03-03 DIAGNOSIS — C180 Malignant neoplasm of cecum: Secondary | ICD-10-CM

## 2020-03-03 DIAGNOSIS — N179 Acute kidney failure, unspecified: Secondary | ICD-10-CM | POA: Diagnosis not present

## 2020-03-03 DIAGNOSIS — Z833 Family history of diabetes mellitus: Secondary | ICD-10-CM | POA: Diagnosis not present

## 2020-03-03 DIAGNOSIS — N281 Cyst of kidney, acquired: Secondary | ICD-10-CM | POA: Diagnosis not present

## 2020-03-03 DIAGNOSIS — Z85038 Personal history of other malignant neoplasm of large intestine: Secondary | ICD-10-CM | POA: Insufficient documentation

## 2020-03-03 DIAGNOSIS — N401 Enlarged prostate with lower urinary tract symptoms: Secondary | ICD-10-CM | POA: Diagnosis not present

## 2020-03-03 DIAGNOSIS — Z79899 Other long term (current) drug therapy: Secondary | ICD-10-CM | POA: Insufficient documentation

## 2020-03-03 DIAGNOSIS — N3289 Other specified disorders of bladder: Secondary | ICD-10-CM | POA: Insufficient documentation

## 2020-03-03 DIAGNOSIS — N189 Chronic kidney disease, unspecified: Secondary | ICD-10-CM | POA: Diagnosis not present

## 2020-03-03 LAB — BASIC METABOLIC PANEL
Anion gap: 7 (ref 5–15)
BUN: 21 mg/dL (ref 8–23)
CO2: 24 mmol/L (ref 22–32)
Calcium: 9 mg/dL (ref 8.9–10.3)
Chloride: 107 mmol/L (ref 98–111)
Creatinine, Ser: 1.36 mg/dL — ABNORMAL HIGH (ref 0.61–1.24)
GFR calc Af Amer: 52 mL/min — ABNORMAL LOW (ref >60)
GFR calc non Af Amer: 45 mL/min — ABNORMAL LOW (ref >60)
Glucose, Bld: 105 mg/dL — ABNORMAL HIGH (ref 70–99)
Potassium: 4.1 mmol/L (ref 3.5–5.1)
Sodium: 138 mmol/L (ref 135–145)

## 2020-03-03 LAB — CBC WITH DIFFERENTIAL/PLATELET
Abs Immature Granulocytes: 0.04 10*3/uL (ref 0.00–0.07)
Basophils Absolute: 0.1 10*3/uL (ref 0.0–0.1)
Basophils Relative: 1 %
Eosinophils Absolute: 0.2 10*3/uL (ref 0.0–0.5)
Eosinophils Relative: 2 %
HCT: 33.9 % — ABNORMAL LOW (ref 39.0–52.0)
Hemoglobin: 10.1 g/dL — ABNORMAL LOW (ref 13.0–17.0)
Immature Granulocytes: 1 %
Lymphocytes Relative: 12 %
Lymphs Abs: 0.8 10*3/uL (ref 0.7–4.0)
MCH: 25.6 pg — ABNORMAL LOW (ref 26.0–34.0)
MCHC: 29.8 g/dL — ABNORMAL LOW (ref 30.0–36.0)
MCV: 86 fL (ref 80.0–100.0)
Monocytes Absolute: 0.5 10*3/uL (ref 0.1–1.0)
Monocytes Relative: 7 %
Neutro Abs: 5.2 10*3/uL (ref 1.7–7.7)
Neutrophils Relative %: 77 %
Platelets: 278 10*3/uL (ref 150–400)
RBC: 3.94 MIL/uL — ABNORMAL LOW (ref 4.22–5.81)
RDW: 18.9 % — ABNORMAL HIGH (ref 11.5–15.5)
WBC: 6.8 10*3/uL (ref 4.0–10.5)
nRBC: 0 % (ref 0.0–0.2)

## 2020-03-03 LAB — PSA: Prostatic Specific Antigen: 145.1 ng/mL — ABNORMAL HIGH (ref 0.00–4.00)

## 2020-03-03 NOTE — Assessment & Plan Note (Addendum)
1.  Stage IIb (PT4APN0) cecal cancer: -Colonoscopy for iron deficiency anemia on 12/28/2019 showed cecal mass suspicious for carcinoma. -CT CAP on 01/17/2020 showed irregular wall thickening involving the medial wall of the cecum extending into the terminal ileum.  No evidence of metastatic disease.  Asymmetric mass involving the right lobe of prostate gland with extension into the right seminal vesicle and bladder base suspicious for prostate cancer.  Stable biliary dilation and bilateral renal cysts.  Age indeterminate T12 compression deformity. -Right colectomy on 02/04/2020 shows tumor involving ileocecal valve and cecum, carcinoma focally invading into the serosal surface, grade 2, margins negative, negative for lymphovascular and perineural invasion, 0/19 lymph nodes involved, pT4a, PN0.  MMR is preserved.  4 separate tubular adenomas were seen. -I have reviewed the normal prognosis of stage II colon cancer.  We also reviewed normal follow-ups with CEA every 3 months and CT scan every 6 months during the first 2 years. -He had reportedly had colon cancer in 1999 and underwent surgery without need for any chemo or radiation. -We will check a CEA level today.  Based on the CT scan findings, I have also recommended checking a PSA. -He will come back in 1 month for follow-up.  2.  Iron deficiency state: -Labs on 02/23/2020 shows ferritin of 19 and percent saturation of 4.  B12 was 247.  Folic acid was normal. -I have recommended checking methylmalonic acid and copper levels. -Hemoglobin was 8.1 with normal white count and platelet count.  MCV is 81.5. -He is taking Flintstone iron tablets daily.  I have recommended Feraheme weekly x2 for faster recovery of his anemia and functional status. -He also appears to have some degree of CKD.  His creatinine since January ranged anywhere between 1.4 and 1.8.

## 2020-03-03 NOTE — Progress Notes (Signed)
[facility] NOTE  Patient Care Team: [name], MD as PCP - General (Family Medicine) Gala Romney Cristopher Estimable, MD as Consulting Physician (Gastroenterology) Donetta Potts, RN as Oncology Nurse Navigator (Oncology) Derek Jack, MD as Medical Oncologist (Oncology)  CHIEF COMPLAINTS/PURPOSE OF CONSULTATION:  Cecal adenocarcinoma.  HISTORY OF PRESENTING ILLNESS:  Tony Holt 86 y.o. male is seen in consultation today at the request of Dr. Constance Haw for further work-up and management of cecal adenocarcinoma.  He had a history of iron deficiency anemia for which he underwent EGD and colonoscopy on 12/28/2019 by Dr. Buford Dresser.  Colonoscopy showed cecal mass suspicious for cancer, biopsy confirmed adenocarcinoma.  He had a CT CAP on 01/17/2020 showed irregular wall thickening involving the medial wall of the cecum extending into the terminal ileum.  No evidence of metastatic disease.  Asymmetric mass involving right lobe of the prostate gland with extension into the right seminal vesicle, bladder base suspicious for prostate cancer.  Stable biliary dilation present.  Bilateral renal cysts present.  Age-indeterminate T12 compression deformity.  He was evaluated by Dr. Constance Haw and underwent right hemicolectomy on 02/04/2020.  Pathology was consistent with grade 2 adenocarcinoma involving ileocecal valve and cecum, focally invading into the serosal surface, margins negative.  Negative for lymphovascular and perineural invasion.  0/lymph nodes were positive.  Pathological staging is PT4APN0.  MMR was preserved.  4 separate tubular adenomas were also seen.  He lives at home by himself although his daughter or one of his family members checks him all the time.  He also had a history of colon cancer in 1999 for which he underwent surgery.  He quit driving about 1 and half year ago because of his vision.  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  He  quit smoking about 30 to 35 years ago.  He smoked 1 and half pack per day for few years.  Since then he has been dipping snuff.  No family history of malignancies.  He is taking Flintstone iron tablets daily.  MEDICAL HISTORY:  Past Medical History:  Diagnosis Date  . BPH (benign prostatic hyperplasia)   . Colon cancer ([place]) 1999  . Dysrhythmia    new onset A Fib  . Essential hypertension   . GERD (gastroesophageal reflux disease)   . Hard of hearing     SURGICAL HISTORY: Past Surgical History:  Procedure Laterality Date  . BIOPSY  12/28/2019   Procedure: BIOPSY;  Surgeon: Daneil Dolin, MD;  Location: [facility] ENDO SUITE;  Service: Endoscopy;;  gastric, esophagus,cecal  . CATARACT EXTRACTION W/PHACO  01/18/2013   Procedure: CATARACT EXTRACTION PHACO AND INTRAOCULAR LENS PLACEMENT ([place]);  Surgeon: Tonny Branch, MD;  Location: [facility] ORS;  Service: Ophthalmology;  Laterality: Left;  CDE 17.11  . CATARACT EXTRACTION W/PHACO Right 02/05/2013   Procedure: CATARACT EXTRACTION PHACO AND INTRAOCULAR LENS PLACEMENT ([facility]);  Surgeon: Tonny Branch, MD;  Location: [facility] ORS;  Service: Ophthalmology;  Laterality: Right;  CDE:19.49  . COLON SURGERY  1999   Sigmoid colon cancer, segmental resection by Drs. Smith/Bradford  . COLONOSCOPY  2002   Multiple adenomatous colon polyps  . COLONOSCOPY N/A 12/28/2019   Procedure: COLONOSCOPY;  Surgeon: Daneil Dolin, MD;  Location: [facility];  Service: Endoscopy;  Laterality: N/A;  . ESOPHAGOGASTRODUODENOSCOPY  03/25/2012   Dr. Gala Romney: Severe ulcerative/erosive reflux esophagitis.  Small hiatal hernia.  Abnormal gastric and duodenal mucosa, biopsy showed reactive changes and minimal chronic  inflammation but no H. pylori.  . ESOPHAGOGASTRODUODENOSCOPY N/A 12/28/2019   Procedure: ESOPHAGOGASTRODUODENOSCOPY (EGD);  Surgeon: Daneil Dolin, MD;  Location: [facility] SU[facility];  Service: Endoscopy;  Laterality: N/A;  . HERNIA REPAIR  02/2011   Right inguinal hernia repair with mesh, Dr.  Geroge Baseman  . HERNIA REPAIR     three total  . PARTIAL COLECTOMY Right 02/04/2020   Procedure: RIGHT HEMI-COLECTOMY;  Surgeon: Virl Cagey, MD;  Location: [facility] ORS;  Service: General;  Laterality: Right;  . POLYPECTOMY  12/28/2019   Procedure: POLYPECTOMY;  Surgeon: Daneil Dolin, MD;  Location: [facility];  Service: Endoscopy;;  rectal     SOCIAL HISTORY: Social History   Socioeconomic History  . Marital status: Widowed    Spouse name: Not on file  . Number of children: 5  . Years of education: Not on file  . Highest education level: Not on file  Occupational History  . Occupation: Retired Neurosurgeon: RETIRED  Tobacco Use  . Smoking status: Former Smoker    Packs/day: 1.00    Years: 50.00    Pack years: 50.00    Types: Cigarettes  . Smokeless tobacco: Current User    Types: Snuff  Substance and Sexual Activity  . Alcohol use: No    Alcohol/week: 0.0 standard drinks    Comment: previous heavy drinker at age [age]  . Drug use: No  . Sexual activity: Not on file  Other Topics Concern  . Not on file  Social History Narrative  . Not on file   Social Determinants of Health   Financial Resource Strain: Low Risk   . Difficulty of Paying Living Expenses: Not hard at all  Food Insecurity: No Food Insecurity  . Worried About Charity fundraiser in the Last Year: Never true  . Ran Out of Food in the Last Year: Never true  Transportation Needs: No Transportation Needs  . Lack of Transportation (Medical): No  . Lack of Transportation (Non-Medical): No  Physical Activity: Insufficiently Active  . Days of Exercise per Week: 3 days  . Minutes of Exercise per Session: 20 min  Stress: No Stress Concern Present  . Feeling of Stress : Only a little  Social Connections: Moderately Isolated  . Frequency of Communication with Friends and Family: More than three times a week  . Frequency of Social Gatherings with Friends and Family: More than three times a week  .  Attends Religious Services: Never  . Active Member of Clubs or Organizations: No  . Attends Archivist Meetings: Never  . Marital Status: Widowed  Intimate Partner Violence: Not At Risk  . Fear of Current or Ex-Partner: No  . Emotionally Abused: No  . Physically Abused: No  . Sexually Abused: No    FAMILY HISTORY: Family History  Problem Relation Age of Onset  . Heart attack Father        Deceased, age 50s  . Diabetes Brother   . Colon cancer Neg Hx   . Liver disease Neg Hx     ALLERGIES:  has No Known Allergies.  MEDICATIONS:  Current Outpatient Medications  Medication Sig Dispense Refill  . diphenhydramine-acetaminophen (TYLENOL PM) 25-500 MG TABS tablet Take 2 tablets by mouth at bedtime.     . docusate sodium (COLACE) 100 MG capsule Take 1 capsule (100 mg total) by mouth 2 (two) times daily as needed for mild constipation. (Patient not taking: Reported on 02/23/2020) 60 capsule 1  .  Ensure (ENSURE) Take 237 mLs by mouth 3 (three) times daily between meals.    Marland Kitchen HYDROcodone-acetaminophen (NORCO/VICODIN) 5-325 MG tablet Take 1 tablet by mouth every 12 (twelve) hours as needed for moderate pain.   0  . nutrition supplement, JUVEN, (JUVEN) PACK Take 1 packet by mouth 2 (two) times daily between meals. 60 packet 0  . omeprazole (PRILOSEC) 20 MG capsule Take 20 mg by mouth 2 (two) times daily.  11  . ondansetron (ZOFRAN-ODT) 4 MG disintegrating tablet Take 1 tablet (4 mg total) by mouth every 6 (six) hours as needed for nausea. (Patient not taking: Reported on 02/29/2020) 20 tablet 0  . Pediatric Multiple Vitamins (FLINTSTONES MULTIVITAMIN PO) Take 1 tablet by mouth daily.     . Probiotic Product (CULTURELLE PROBIOTICS PO) Take by mouth daily.    . sodium bicarbonate 650 MG tablet Take 1 tablet (650 mg total) by mouth daily for 7 days. 7 tablet 0  . Tamsulosin HCl (FLOMAX) 0.4 MG CAPS Take 1 capsule (0.4 mg total) by mouth daily. 30 capsule 12   No current  facility-administered medications for this visit.    REVIEW OF SYSTEMS:   Constitutional: Denies fevers, chills or abnormal night sweats Eyes: Denies blurriness of vision, double vision or watery eyes Ears, nose, mouth, throat, and face: Denies mucositis or sore throat Respiratory: Denies cough, dyspnea or wheezes Cardiovascular: Denies palpitation, chest discomfort or lower extremity swelling Gastrointestinal:  Denies nausea, heartburn or change in bowel habits Skin: Denies abnormal skin rashes Lymphatics: Denies new lymphadenopathy or easy bruising Neurological:Denies numbness, tingling or new weaknesses Behavioral/Psych: Mood is stable, no new changes  All other systems were reviewed with the patient and are negative.  PHYSICAL EXAMINATION: ECOG PERFORMANCE STATUS: 2 - Symptomatic, <50% confined to bed  Vitals:   03/03/20 1258  BP: 119/71  Pulse: 75  Resp: 18  Temp: (!) 97.5 F (36.4 C)  SpO2: 100%   Filed Weights   03/03/20 1258  Weight: 129 lb (58.5 kg)    GENERAL:alert, no distress and comfortable SKIN: skin color, texture, turgor are normal, no rashes or significant lesions EYES: normal, conjunctiva are pink and non-injected, sclera clear OROPHARYNX:no exudate, no erythema and lips, buccal mucosa, and tongue normal  NECK: supple, thyroid normal size, non-tender, without nodularity LYMPH:  no palpable lymphadenopathy in the cervical, axillary or inguinal LUNGS: clear to auscultation and percussion with normal breathing effort HEART: regular rate & rhythm and no murmurs and no lower extremity edema ABDOMEN:abdomen soft, non-tender and normal bowel sounds.  Midline scar is well-healed. Musculoskeletal:no cyanosis of digits and no clubbing  PSYCH: alert & oriented x 3 with fluent speech NEURO: no focal motor/sensory deficits  LABORATORY DATA:  I have reviewed the data as listed Lab Results  Component Value Date   WBC 5.5 02/25/2020   HGB 8.1 (L) 02/25/2020   HCT  26.4 (L) 02/25/2020   MCV 81.5 02/25/2020   PLT 375 02/25/2020     Chemistry      Component Value Date/Time   NA 139 02/25/2020 0439   K 4.9 02/25/2020 0439   CL 110 02/25/2020 0439   CO2 21 (L) 02/25/2020 0439   BUN 28 (H) 02/25/2020 0439   CREATININE 1.85 (H) 02/25/2020 0439      Component Value Date/Time   CALCIUM 9.1 02/25/2020 0439   ALKPHOS 65 02/22/2020 1525   AST 17 02/22/2020 1525   ALT 14 02/22/2020 1525   BILITOT 0.4 02/22/2020 1525  RADIOGRAPHIC STUDIES: I have personally reviewed the radiological images as listed and agreed with the findings in the report. US RENAL  Result Date: 02/22/2020 CLINICAL DATA:  Acute kidney injury. EXAM: RENAL / URINARY TRACT ULTRASOUND COMPLETE COMPARISON:  01/17/2020 CT abdomen/pelvis. FINDINGS: Right Kidney: Renal measurements: 11.2 x 4.8 x 6.0 cm = volume: 169 mL. Normal renal parenchymal echogenicity and thickness. No hydronephrosis. Multiple simple appearing right renal cysts, largest 7.6 x 5.0 x 5.9 cm in the upper right kidney. Left Kidney: Renal measurements: 10.8 x 6.1 x 5.3 cm = volume: 182 mL. Normal renal parenchymal echogenicity and thickness. No hydronephrosis. Multiple simple appearing left renal cysts, largest 4.2 x 3.3 x 4.7 cm in the lateral lower left kidney. Bladder: Appears normal for degree of bladder distention. Ureteral jets demonstrated bilaterally in the bladder. Other: Enlarged prostate measuring approximately 6.7 x 4.2 x 5.2 cm. IMPRESSION: 1. No hydronephrosis. 2. Simple bilateral renal cysts. 3. Prostatomegaly. Please see recent 01/17/2020 CT report for further details regarding the prostate. Electronically Signed   By: Ilona Sorrel M.D.   On: 02/22/2020 17:31    ASSESSMENT & PLAN:  Cecal cancer (HCC) 1.  Stage IIb (PT4APN0) cecal cancer: -Colonoscopy for iron deficiency anemia on 12/28/2019 showed cecal mass suspicious for carcinoma. -CT CAP on 01/17/2020 showed irregular wall thickening involving the medial  wall of the cecum extending into the terminal ileum.  No evidence of metastatic disease.  Asymmetric mass involving the right lobe of prostate gland with extension into the right seminal vesicle and bladder base suspicious for prostate cancer.  Stable biliary dilation and bilateral renal cysts.  Age indeterminate T12 compression deformity. -Right colectomy on 02/04/2020 shows tumor involving ileocecal valve and cecum, carcinoma focally invading into the serosal surface, grade 2, margins negative, negative for lymphovascular and perineural invasion, 0/19 lymph nodes involved, pT4a, PN0.  MMR is preserved.  4 separate tubular adenomas were seen. -I have reviewed the normal prognosis of stage II colon cancer.  We also reviewed normal follow-ups with CEA every 3 months and CT scan every 6 months during the first 2 years. -He had reportedly had colon cancer in 1999 and underwent surgery without need for any chemo or radiation. -We will check a CEA level today.  Based on the CT scan findings, I have also recommended checking a PSA. -He will come back in 1 month for follow-up.  2.  Iron deficiency state: -Labs on 02/23/2020 shows ferritin of 19 and percent saturation of 4.  B12 was 247.  Folic acid was normal. -I have recommended checking methylmalonic acid and copper levels. -Hemoglobin was 8.1 with normal white count and platelet count.  MCV is 81.5. -He is taking Flintstone iron tablets daily.  I have recommended Feraheme weekly x2 for faster recovery of his anemia and functional status. -He also appears to have some degree of CKD.  His creatinine since January ranged anywhere between 1.4 and 1.8.  Orders Placed This Encounter  Procedures  . CEA    Standing Status:   Future    Standing Expiration Date:   03/03/2021  . PSA    Standing Status:   Future    Standing Expiration Date:   03/03/2021  . CBC with Differential    Standing Status:   Future    Standing Expiration Date:   03/03/2021  .  Methylmalonic acid, serum    Standing Status:   Future    Standing Expiration Date:   03/03/2021  . Copper, serum  Standing Status:   Future    Standing Expiration Date:   03/03/2021    All questions were answered. The patient knows to call the clinic with any problems, questions or concerns.      Derek Jack, MD 03/03/2020 1:33 PM

## 2020-03-03 NOTE — Patient Instructions (Addendum)
[place] at [facility] Discharge Instructions  You were seen today by Dr. Delton Coombes. He went over your history, family history and how you've been feeling since your surgery. He checked your surgical site. You have a stage 2 colon cancer, you will not need any treatment at this time for this. Your blood counts and iron level are low. He will schedule you for 2 weekly doses of Feraheme. He will see you back in 1 month for labs and follow up.   Thank you for choosing [place] at [facility] to provide your oncology and hematology care.  To afford each patient quality time with our provider, please arrive at least 15 minutes before your scheduled appointment time.   If you have a lab appointment with the [place] please come in thru the  Main Entrance and check in at the main information desk  You need to re-schedule your appointment should you arrive 10 or more minutes late.  We strive to give you quality time with our providers, and arriving late affects you and other patients whose appointments are after yours.  Also, if you no show three or more times for appointments you may be dismissed from the clinic at the providers discretion.     Again, thank you for choosing [facility].  Our hope is that these requests will decrease the amount of time that you wait before being seen by our physicians.       _____________________________________________________________  Should you have questions after your visit to [facility], please contact our office at [phone] between the hours of 8:00 a.m. and 4:30 p.m.  Voicemails left after 4:00 p.m. will not be returned until the following business day.  For prescription refill requests, have your pharmacy contact our office and allow 72 hours.    Cancer Center Support Programs:   > Cancer Support Group  2nd Tuesday of the month 1pm-2pm, Journey Room

## 2020-03-04 ENCOUNTER — Ambulatory Visit (INDEPENDENT_AMBULATORY_CARE_PROVIDER_SITE_OTHER): Payer: Self-pay | Admitting: General Surgery

## 2020-03-04 ENCOUNTER — Encounter: Payer: Self-pay | Admitting: General Surgery

## 2020-03-04 ENCOUNTER — Other Ambulatory Visit: Payer: Self-pay

## 2020-03-04 VITALS — BP 129/76 | HR 74 | Temp 97.6°F | Resp 14 | Ht 68.0 in | Wt 129.0 lb

## 2020-03-04 DIAGNOSIS — N179 Acute kidney failure, unspecified: Secondary | ICD-10-CM

## 2020-03-04 DIAGNOSIS — C182 Malignant neoplasm of ascending colon: Secondary | ICD-10-CM

## 2020-03-04 LAB — CEA: CEA: 2.7 ng/mL (ref 0.0–4.7)

## 2020-03-04 NOTE — Patient Instructions (Signed)
Diet as tolerated. No heavy lifting until > 8 weeks after surgery.

## 2020-03-05 ENCOUNTER — Inpatient Hospital Stay (HOSPITAL_COMMUNITY): Payer: Medicare HMO

## 2020-03-05 ENCOUNTER — Encounter (HOSPITAL_COMMUNITY): Payer: Self-pay

## 2020-03-05 ENCOUNTER — Other Ambulatory Visit: Payer: Self-pay

## 2020-03-05 VITALS — BP 124/74 | HR 70 | Temp 97.1°F | Resp 18

## 2020-03-05 DIAGNOSIS — N189 Chronic kidney disease, unspecified: Secondary | ICD-10-CM | POA: Diagnosis not present

## 2020-03-05 DIAGNOSIS — D509 Iron deficiency anemia, unspecified: Secondary | ICD-10-CM

## 2020-03-05 DIAGNOSIS — Z79899 Other long term (current) drug therapy: Secondary | ICD-10-CM | POA: Diagnosis not present

## 2020-03-05 DIAGNOSIS — E611 Iron deficiency: Secondary | ICD-10-CM | POA: Diagnosis not present

## 2020-03-05 DIAGNOSIS — N281 Cyst of kidney, acquired: Secondary | ICD-10-CM | POA: Diagnosis not present

## 2020-03-05 DIAGNOSIS — N401 Enlarged prostate with lower urinary tract symptoms: Secondary | ICD-10-CM | POA: Diagnosis not present

## 2020-03-05 DIAGNOSIS — I4891 Unspecified atrial fibrillation: Secondary | ICD-10-CM | POA: Diagnosis not present

## 2020-03-05 DIAGNOSIS — N3289 Other specified disorders of bladder: Secondary | ICD-10-CM | POA: Diagnosis not present

## 2020-03-05 DIAGNOSIS — N179 Acute kidney failure, unspecified: Secondary | ICD-10-CM | POA: Diagnosis not present

## 2020-03-05 DIAGNOSIS — C180 Malignant neoplasm of cecum: Secondary | ICD-10-CM | POA: Diagnosis not present

## 2020-03-05 LAB — COPPER, SERUM: Copper: 97 ug/dL (ref 69–132)

## 2020-03-05 MED ORDER — SODIUM CHLORIDE 0.9 % IV SOLN: Freq: Once | INTRAVENOUS | Status: AC

## 2020-03-05 MED ORDER — SODIUM CHLORIDE 0.9 % IV SOLN
510.0000 mg | Freq: Once | INTRAVENOUS | Status: AC
  Administered 2020-03-05: 510 mg via INTRAVENOUS
  Filled 2020-03-05: qty 510

## 2020-03-05 NOTE — Patient Instructions (Signed)
[facility] at [facility]  Discharge Instructions:   _______________________________________________________________  Thank you for choosing  [facility] at [facility] to provide your oncology and hematology care.  To afford each patient quality time with our providers, please arrive at least 15 minutes before your scheduled appointment.  You need to re-schedule your appointment if you arrive 10 or more minutes late.  We strive to give you quality time with our providers, and arriving late affects you and other patients whose appointments are after yours.  Also, if you no show three or more times for appointments you may be dismissed from the clinic.  Again, thank you for choosing  Cancer Center at [facility]. Our hope is that these requests will allow you access to exceptional care and in a timely manner. _______________________________________________________________  If you have questions after your visit, please contact our office at [phone] between the hours of 8:30 a.m. and 5:00 p.m. Voicemails left after 4:30 p.m. will not be returned until the following business day. _______________________________________________________________  For prescription refill requests, have your pharmacy contact our office. _______________________________________________________________  Recommendations made by the consultant and any test results will be sent to your referring physician. _______________________________________________________________ 

## 2020-03-05 NOTE — Progress Notes (Signed)
[facility] Note   HPI:  86 y.o. Male presents to clinic for follow-up evaluation after being admitted with hyperkalemia and acute kidney injury from bactrim used for skin infection and MRSA UTI. He says he is feeling so much better. He is having regular BMs, tolerating a diet, has his appetite back, and is drinking better. He has no complaints.   Review of Systems:  No fever or chills Tolerating diet  All other review of systems: otherwise negative   Vital Signs:  BP 129/76   Pulse 74   Temp 97.6 F (36.4 C) (Oral)   Resp 14   Ht 5' 8"  (1.727 m)   Wt 129 lb (58.5 kg)   SpO2 93%   BMI 19.61 kg/m    Physical Exam:  Physical Exam Vitals reviewed.  HENT:     Head: Normocephalic.  Cardiovascular:     Rate and Rhythm: Normal rate.  Pulmonary:     Effort: Pulmonary effort is normal.  Abdominal:     General: There is no distension.     Palpations: Abdomen is soft.     Tenderness: There is no abdominal tenderness.     Comments: Healing midline, no erythema or drainage   Musculoskeletal:        General: Normal range of motion.  Skin:    General: Skin is warm.  Neurological:     General: No focal deficit present.     Mental Status: He is alert.  Psychiatric:        Mood and Affect: Mood normal.        Behavior: Behavior normal.     Results for MAYSEN, SUDOL (MRN [id number]) as of 03/05/2020 10:54  Ref. Range 03/03/2020 13:55  Sodium Latest Ref Range: 135 - 145 mmol/L 138  Potassium Latest Ref Range: 3.5 - 5.1 mmol/L 4.1  Chloride Latest Ref Range: 98 - 111 mmol/L 107  CO2 Latest Ref Range: 22 - 32 mmol/L 24  Glucose Latest Ref Range: 70 - 99 mg/dL 105 (H)  BUN Latest Ref Range: 8 - 23 mg/dL 21  Creatinine Latest Ref Range: 0.61 - 1.24 mg/dL 1.36 (H)  Calcium Latest Ref Range: 8.9 - 10.3 mg/dL 9.0  Anion gap Latest Ref Range: 5 - 15  7  GFR, Est Non African American Latest Ref Range: >60 mL/min 45 (L)  GFR, Est African American Latest Ref Range:  >60 mL/min 52 (L)  Copper Latest Ref Range: 69 - 132 ug/dL 97  WBC Latest Ref Range: 4.0 - 10.5 K/uL 6.8  RBC Latest Ref Range: 4.22 - 5.81 MIL/uL 3.94 (L)  Hemoglobin Latest Ref Range: 13.0 - 17.0 g/dL 10.1 (L)  HCT Latest Ref Range: 39.0 - 52.0 % 33.9 (L)  MCV Latest Ref Range: 80.0 - 100.0 fL 86.0  MCH Latest Ref Range: 26.0 - 34.0 pg 25.6 (L)  MCHC Latest Ref Range: 30.0 - 36.0 g/dL 29.8 (L)  RDW Latest Ref Range: 11.5 - 15.5 % 18.9 (H)  Platelets Latest Ref Range: 150 - 400 K/uL 278  nRBC Latest Ref Range: 0.0 - 0.2 % 0.0  Neutrophils Latest Units: % 77  Lymphocytes Latest Units: % 12  Monocytes Relative Latest Units: % 7  Eosinophil Latest Units: % 2  Basophil Latest Units: % 1  Immature Granulocytes Latest Units: % 1  NEUT# Latest Ref Range: 1.7 - 7.7 K/uL 5.2  Lymphocyte # Latest Ref Range: 0.7 - 4.0 K/uL 0.8  Monocyte # Latest Ref Range: 0.1 - 1.0  K/uL 0.5  Eosinophils Absolute Latest Ref Range: 0.0 - 0.5 K/uL 0.2  Basophils Absolute Latest Ref Range: 0.0 - 0.1 K/uL 0.1  Abs Immature Granulocytes Latest Ref Range: 0.00 - 0.07 K/uL 0.04  CEA Latest Ref Range: 0.0 - 4.7 ng/mL 2.7  Prostatic Specific Antigen Latest Ref Range: 0.00 - 4.00 ng/mL 145.10 (H)    Assessment:  86 y.o. yo Male s/p right hemicolectomy for colon cancer who was admitted with an acute kidney injury and hyperkalemia from likely dehydration and bactrim use. He is doing well now. He has met with Dr. Delton Coombes to discuss their options. He has an appointment with Dr. Alyson Ingles for his prostate cancer 4/26.  He says he is going to get some iron infusion given his prolonged anemia.  Creatinine back to baseline and K normal on labs yesterday.   Plan:  Diet as tolerated. No heavy lifting until > 8 weeks after surgery.  Follow up with me PRN  Future Appointments  Date Time Provider [place]  03/05/2020  1:30 PM [facility] CHAIR 1 [facility] None  03/12/2020  1:30 PM [facility] CHAIR 1 [facility] None   04/03/2020  3:00 PM [facility] LAB [facility] None  04/03/2020  4:05 PM Derek Jack, MD [facility] None  04/07/2020  2:00 PM McKenzie, Candee Furbish, MD [facility] None     All of the above recommendations were discussed with the patient and patient's family, and all of patient's and family's questions were answered to his/her/their expressed satisfaction.  Curlene Labrum, MD [facility] [address] [place], [place] [address] [phone] (office)

## 2020-03-05 NOTE — Progress Notes (Signed)
Patient presents today for Feraheme infusion. MAR reviewed. Vital signs stable. Patient has no complaints of any pain or changes since the last visit.   Feraheme given today per MD orders. Tolerated infusion without adverse affects. Vital signs stable. No complaints at this time. Discharged from clinic via wheel chair.  F/U with [facility] as scheduled.

## 2020-03-09 LAB — METHYLMALONIC ACID, SERUM: Methylmalonic Acid, Quantitative: 273 nmol/L (ref 0–378)

## 2020-03-11 DIAGNOSIS — C180 Malignant neoplasm of cecum: Secondary | ICD-10-CM | POA: Diagnosis not present

## 2020-03-11 DIAGNOSIS — M545 Low back pain: Secondary | ICD-10-CM | POA: Diagnosis not present

## 2020-03-11 DIAGNOSIS — Z79891 Long term (current) use of opiate analgesic: Secondary | ICD-10-CM | POA: Diagnosis not present

## 2020-03-12 ENCOUNTER — Inpatient Hospital Stay (HOSPITAL_COMMUNITY): Payer: Medicare HMO

## 2020-03-12 ENCOUNTER — Other Ambulatory Visit: Payer: Self-pay

## 2020-03-12 ENCOUNTER — Encounter (HOSPITAL_COMMUNITY): Payer: Self-pay

## 2020-03-12 VITALS — BP 124/62 | HR 72 | Temp 97.9°F | Resp 18

## 2020-03-12 DIAGNOSIS — N3289 Other specified disorders of bladder: Secondary | ICD-10-CM | POA: Diagnosis not present

## 2020-03-12 DIAGNOSIS — N189 Chronic kidney disease, unspecified: Secondary | ICD-10-CM | POA: Diagnosis not present

## 2020-03-12 DIAGNOSIS — E611 Iron deficiency: Secondary | ICD-10-CM | POA: Diagnosis not present

## 2020-03-12 DIAGNOSIS — C180 Malignant neoplasm of cecum: Secondary | ICD-10-CM | POA: Diagnosis not present

## 2020-03-12 DIAGNOSIS — N179 Acute kidney failure, unspecified: Secondary | ICD-10-CM | POA: Diagnosis not present

## 2020-03-12 DIAGNOSIS — N281 Cyst of kidney, acquired: Secondary | ICD-10-CM | POA: Diagnosis not present

## 2020-03-12 DIAGNOSIS — Z79899 Other long term (current) drug therapy: Secondary | ICD-10-CM | POA: Diagnosis not present

## 2020-03-12 DIAGNOSIS — I4891 Unspecified atrial fibrillation: Secondary | ICD-10-CM | POA: Diagnosis not present

## 2020-03-12 DIAGNOSIS — N401 Enlarged prostate with lower urinary tract symptoms: Secondary | ICD-10-CM | POA: Diagnosis not present

## 2020-03-12 DIAGNOSIS — D509 Iron deficiency anemia, unspecified: Secondary | ICD-10-CM

## 2020-03-12 MED ORDER — SODIUM CHLORIDE 0.9 % IV SOLN
510.0000 mg | Freq: Once | INTRAVENOUS | Status: AC
  Administered 2020-03-12: 510 mg via INTRAVENOUS
  Filled 2020-03-12: qty 510

## 2020-03-12 MED ORDER — SODIUM CHLORIDE 0.9 % IV SOLN: Freq: Once | INTRAVENOUS | Status: AC

## 2020-03-12 NOTE — Progress Notes (Signed)
Alanda Slim tolerated Feraheme infusion well without complaints or incident. Peripheral IV site checked with positive blood return noted prior to and after infusion. VSS upon discharge. Pt discharged via wheelchair in satisfactory condition accompanied by family member

## 2020-03-12 NOTE — Patient Instructions (Signed)
[facility] at [facility] Discharge Instructions  Received Feraheme infusion today. Follow-up as scheduled. Call clinic for any questions or concerns   Thank you for choosing [facility] at [facility] to provide your oncology and hematology care.  To afford each patient quality time with our provider, please arrive at least 15 minutes before your scheduled appointment time.   If you have a lab appointment with the Cancer Center please come in thru the Main Entrance and check in at the main information desk.  You need to re-schedule your appointment should you arrive 10 or more minutes late.  We strive to give you quality time with our providers, and arriving late affects you and other patients whose appointments are after yours.  Also, if you no show three or more times for appointments you may be dismissed from the clinic at the providers discretion.     Again, thank you for choosing [facility].  Our hope is that these requests will decrease the amount of time that you wait before being seen by our physicians.       _____________________________________________________________  Should you have questions after your visit to [facility], please contact our office at [phone] between the hours of 8:00 a.m. and 4:30 p.m.  Voicemails left after 4:00 p.m. will not be returned until the following business day.  For prescription refill requests, have your pharmacy contact our office and allow 72 hours.    Due to Covid, you will need to wear a mask upon entering the hospital. If you do not have a mask, a mask will be given to you at the Main Entrance upon arrival. For doctor visits, patients may have 1 support person with them. For treatment visits, patients can not have anyone with them due to social distancing guidelines and our immunocompromised population.     

## 2020-04-02 ENCOUNTER — Other Ambulatory Visit (HOSPITAL_COMMUNITY): Payer: Self-pay | Admitting: *Deleted

## 2020-04-02 DIAGNOSIS — C180 Malignant neoplasm of cecum: Secondary | ICD-10-CM

## 2020-04-02 DIAGNOSIS — D509 Iron deficiency anemia, unspecified: Secondary | ICD-10-CM

## 2020-04-03 ENCOUNTER — Other Ambulatory Visit: Payer: Self-pay

## 2020-04-03 ENCOUNTER — Inpatient Hospital Stay (HOSPITAL_COMMUNITY): Payer: Medicare HMO | Attending: Hematology

## 2020-04-03 ENCOUNTER — Inpatient Hospital Stay (HOSPITAL_BASED_OUTPATIENT_CLINIC_OR_DEPARTMENT_OTHER): Payer: Medicare HMO | Admitting: Hematology

## 2020-04-03 VITALS — BP 126/64 | HR 84 | Temp 96.9°F | Resp 18 | Wt 130.0 lb

## 2020-04-03 DIAGNOSIS — R972 Elevated prostate specific antigen [PSA]: Secondary | ICD-10-CM | POA: Insufficient documentation

## 2020-04-03 DIAGNOSIS — Z833 Family history of diabetes mellitus: Secondary | ICD-10-CM | POA: Insufficient documentation

## 2020-04-03 DIAGNOSIS — C61 Malignant neoplasm of prostate: Secondary | ICD-10-CM | POA: Insufficient documentation

## 2020-04-03 DIAGNOSIS — Z8249 Family history of ischemic heart disease and other diseases of the circulatory system: Secondary | ICD-10-CM | POA: Diagnosis not present

## 2020-04-03 DIAGNOSIS — N189 Chronic kidney disease, unspecified: Secondary | ICD-10-CM | POA: Diagnosis not present

## 2020-04-03 DIAGNOSIS — N281 Cyst of kidney, acquired: Secondary | ICD-10-CM | POA: Diagnosis not present

## 2020-04-03 DIAGNOSIS — Z87891 Personal history of nicotine dependence: Secondary | ICD-10-CM | POA: Insufficient documentation

## 2020-04-03 DIAGNOSIS — G8929 Other chronic pain: Secondary | ICD-10-CM | POA: Diagnosis not present

## 2020-04-03 DIAGNOSIS — D509 Iron deficiency anemia, unspecified: Secondary | ICD-10-CM

## 2020-04-03 DIAGNOSIS — M549 Dorsalgia, unspecified: Secondary | ICD-10-CM | POA: Insufficient documentation

## 2020-04-03 DIAGNOSIS — I4891 Unspecified atrial fibrillation: Secondary | ICD-10-CM | POA: Insufficient documentation

## 2020-04-03 DIAGNOSIS — C180 Malignant neoplasm of cecum: Secondary | ICD-10-CM

## 2020-04-03 DIAGNOSIS — Z79899 Other long term (current) drug therapy: Secondary | ICD-10-CM | POA: Diagnosis not present

## 2020-04-03 LAB — CBC WITH DIFFERENTIAL/PLATELET
Abs Immature Granulocytes: 0.01 10*3/uL (ref 0.00–0.07)
Basophils Absolute: 0 10*3/uL (ref 0.0–0.1)
Basophils Relative: 1 %
Eosinophils Absolute: 0.1 10*3/uL (ref 0.0–0.5)
Eosinophils Relative: 2 %
HCT: 38.7 % — ABNORMAL LOW (ref 39.0–52.0)
Hemoglobin: 11.9 g/dL — ABNORMAL LOW (ref 13.0–17.0)
Immature Granulocytes: 0 %
Lymphocytes Relative: 15 %
Lymphs Abs: 0.9 10*3/uL (ref 0.7–4.0)
MCH: 28.3 pg (ref 26.0–34.0)
MCHC: 30.7 g/dL (ref 30.0–36.0)
MCV: 92.1 fL (ref 80.0–100.0)
Monocytes Absolute: 0.5 10*3/uL (ref 0.1–1.0)
Monocytes Relative: 9 %
Neutro Abs: 4.4 10*3/uL (ref 1.7–7.7)
Neutrophils Relative %: 73 %
Platelets: 175 10*3/uL (ref 150–400)
RBC: 4.2 MIL/uL — ABNORMAL LOW (ref 4.22–5.81)
RDW: 23.2 % — ABNORMAL HIGH (ref 11.5–15.5)
WBC: 5.9 10*3/uL (ref 4.0–10.5)
nRBC: 0 % (ref 0.0–0.2)

## 2020-04-03 LAB — PSA: Prostatic Specific Antigen: 133.41 ng/mL — ABNORMAL HIGH (ref 0.00–4.00)

## 2020-04-03 NOTE — Patient Instructions (Signed)
[facility] at [facility] Discharge Instructions  You were seen today by Dr. Katragadda. He went over your recent lab results. He will see you back in 3 months for labs and follow up.   Thank you for choosing [facility] at [facility] to provide your oncology and hematology care.  To afford each patient quality time with our provider, please arrive at least 15 minutes before your scheduled appointment time.   If you have a lab appointment with the Cancer Center please come in thru the  Main Entrance and check in at the main information desk  You need to re-schedule your appointment should you arrive 10 or more minutes late.  We strive to give you quality time with our providers, and arriving late affects you and other patients whose appointments are after yours.  Also, if you no show three or more times for appointments you may be dismissed from the clinic at the providers discretion.     Again, thank you for choosing [facility].  Our hope is that these requests will decrease the amount of time that you wait before being seen by our physicians.       _____________________________________________________________  Should you have questions after your visit to [facility], please contact our office at [phone] between the hours of 8:00 a.m. and 4:30 p.m.  Voicemails left after 4:00 p.m. will not be returned until the following business day.  For prescription refill requests, have your pharmacy contact our office and allow 72 hours.    Cancer Center Support Programs:   > Cancer Support Group  2nd Tuesday of the month 1pm-2pm, Journey Room    

## 2020-04-04 LAB — CEA: CEA: 2.8 ng/mL (ref 0.0–4.7)

## 2020-04-06 LAB — COPPER, SERUM: Copper: 93 ug/dL (ref 69–132)

## 2020-04-07 ENCOUNTER — Other Ambulatory Visit: Payer: Self-pay

## 2020-04-07 ENCOUNTER — Ambulatory Visit: Payer: Medicare HMO | Admitting: Urology

## 2020-04-07 ENCOUNTER — Encounter: Payer: Self-pay | Admitting: Urology

## 2020-04-07 VITALS — BP 150/78 | HR 77 | Temp 97.7°F | Ht 68.0 in | Wt 128.0 lb

## 2020-04-07 DIAGNOSIS — C61 Malignant neoplasm of prostate: Secondary | ICD-10-CM | POA: Diagnosis not present

## 2020-04-07 LAB — POCT URINALYSIS DIPSTICK
Bilirubin, UA: NEGATIVE
Blood, UA: NEGATIVE
Glucose, UA: NEGATIVE
Ketones, UA: NEGATIVE
Leukocytes, UA: NEGATIVE
Nitrite, UA: NEGATIVE
Protein, UA: POSITIVE — AB
Spec Grav, UA: >=1.03 — AB (ref 1.010–1.025)
Urobilinogen, UA: NEGATIVE E.U./dL — AB
pH, UA: 5 (ref 5.0–8.0)

## 2020-04-07 MED ORDER — LEVOFLOXACIN 750 MG PO TABS: 750.0000 mg | ORAL_TABLET | Freq: Every day | ORAL | 0 refills | Status: DC

## 2020-04-07 NOTE — Patient Instructions (Signed)

## 2020-04-07 NOTE — Progress Notes (Signed)
Urological Symptom Review  Patient is experiencing the following symptoms: Frequent urination Hard to postpone urination Get up at night to urinate Leakage of urine   Review of Systems  Gastrointestinal (upper)  : Negative for upper GI symptoms  Gastrointestinal (lower) : Negative for lower GI symptoms  Constitutional : Negative for symptoms  Skin: Negative for skin symptoms  Eyes: Negative for eye symptoms  Ear/Nose/Throat : Negative for Ear/Nose/Throat symptoms  Hematologic/Lymphatic: Negative for Hematologic/Lymphatic symptoms  Cardiovascular : Negative for cardiovascular symptoms  Respiratory : Negative for respiratory symptoms  Endocrine: Negative for endocrine symptoms  Musculoskeletal: Negative for musculoskeletal symptoms  Neurological: Negative for neurological symptoms  Psychologic: Negative for psychiatric symptoms 

## 2020-04-07 NOTE — Progress Notes (Signed)
04/07/2020 2:57 PM   Tony Holt 1926/08/22 [id number]  Referring provider: [name], MD [address] [place],  [place] [address]  Elevated PSA  HPI: Mr Boice is a 86yo here for evaluation of elevated PSA. PSA 133. He recently underwent hemicolectomy for colon cancer. No LUTS. No hematuria. No bone pain   PMH: Past Medical History:  Diagnosis Date  . BPH (benign prostatic hyperplasia)   . Colon cancer ([place]) 1999  . Dysrhythmia    new onset A Fib  . Essential hypertension   . GERD (gastroesophageal reflux disease)   . Hard of hearing   . Prostate cancer [facility])     Surgical History: Past Surgical History:  Procedure Laterality Date  . BIOPSY  12/28/2019   Procedure: BIOPSY;  Surgeon: Daneil Dolin, MD;  Location: [facility];  Service: Endoscopy;;  gastric, esophagus,cecal  . CATARACT EXTRACTION W/PHACO  01/18/2013   Procedure: CATARACT EXTRACTION PHACO AND INTRAOCULAR LENS PLACEMENT ([place]);  Surgeon: Tonny Branch, MD;  Location: [facility] ORS;  Service: Ophthalmology;  Laterality: Left;  CDE 17.11  . CATARACT EXTRACTION W/PHACO Right 02/05/2013   Procedure: CATARACT EXTRACTION PHACO AND INTRAOCULAR LENS PLACEMENT (IOC);  Surgeon: Tonny Branch, MD;  Location: [facility] ORS;  Service: Ophthalmology;  Laterality: Right;  CDE:19.49  . COLON SURGERY  1999   Sigmoid colon cancer, segmental resection by Drs. Smith/Bradford  . COLONOSCOPY  2002   Multiple adenomatous colon polyps  . COLONOSCOPY N/A 12/28/2019   Procedure: COLONOSCOPY;  Surgeon: Daneil Dolin, MD;  Location: [facility];  Service: Endoscopy;  Laterality: N/A;  . ESOPHAGOGASTRODUODENOSCOPY  03/25/2012   Dr. Gala Romney: Severe ulcerative/erosive reflux esophagitis.  Small hiatal hernia.  Abnormal gastric and duodenal mucosa, biopsy showed reactive changes and minimal chronic inflammation but no H. pylori.  . ESOPHAGOGASTRODUODENOSCOPY N/A 12/28/2019   Procedure: ESOPHAGOGASTRODUODENOSCOPY (EGD);  Surgeon: Daneil Dolin,  MD;  Location: [facility];  Service: Endoscopy;  Laterality: N/A;  . HERNIA REPAIR  02/2011   Right inguinal hernia repair with mesh, Dr. Geroge Baseman  . HERNIA REPAIR     three total  . PARTIAL COLECTOMY Right 02/04/2020   Procedure: RIGHT HEMI-COLECTOMY;  Surgeon: Virl Cagey, MD;  Location: [facility] ORS;  Service: General;  Laterality: Right;  . POLYPECTOMY  12/28/2019   Procedure: POLYPECTOMY;  Surgeon: Daneil Dolin, MD;  Location: [facility];  Service: Endoscopy;;  rectal     Home Medications:  Allergies as of 04/07/2020   No Known Allergies     Medication List       Accurate as of April 07, 2020  2:57 PM. If you have any questions, ask your nurse or doctor.        CULTURELLE PROBIOTICS PO Take by mouth daily.   diphenhydramine-acetaminophen 25-500 MG Tabs tablet Commonly known as: TYLENOL PM Take 2 tablets by mouth at bedtime.   docusate sodium 100 MG capsule Commonly known as: COLACE Take 1 capsule (100 mg total) by mouth 2 (two) times daily as needed for mild constipation.   Ensure Take 237 mLs by mouth 3 (three) times daily between meals.   FLINTSTONES MULTIVITAMIN PO Take 1 tablet by mouth daily.   HYDROcodone-acetaminophen 5-325 MG tablet Commonly known as: NORCO/VICODIN Take 1 tablet by mouth every 12 (twelve) hours as needed for moderate pain.   omeprazole 20 MG capsule Commonly known as: PRILOSEC Take 20 mg by mouth 2 (two) times daily.   ondansetron 4 MG disintegrating tablet Commonly  known as: ZOFRAN-ODT Take 1 tablet (4 mg total) by mouth every 6 (six) hours as needed for nausea.   tamsulosin 0.4 MG Caps capsule Commonly known as: FLOMAX Take 1 capsule (0.4 mg total) by mouth daily.       Allergies: No Known Allergies  Family History: Family History  Problem Relation Age of Onset  . Heart attack Father        Deceased, age 41s  . Diabetes Brother   . Colon cancer Neg Hx   . Liver disease Neg Hx     Social History:  reports that  he has quit smoking. His smoking use included cigarettes. He has a 50.00 pack-year smoking history. His smokeless tobacco use includes snuff. He reports that he does not drink alcohol or use drugs.  ROS: All other review of systems were reviewed and are negative except what is noted above in HPI  Physical Exam: BP (!) 150/78   Pulse 77   Temp 97.7 F (36.5 C)   Ht 5\' 8"  (1.727 m)   Wt 128 lb (58.1 kg)   BMI 19.46 kg/m   Constitutional:  Alert and oriented, No acute distress. HEENT: [place] AT, moist mucus membranes.  Trachea midline, no masses. Cardiovascular: No clubbing, cyanosis, or edema. Respiratory: Normal respiratory effort, no increased work of breathing. GI: Abdomen is soft, nontender, nondistended, no abdominal masses GU: No CVA tenderness.  Lymph: No cervical or inguinal lymphadenopathy. Skin: No rashes, bruises or suspicious lesions. Neurologic: Grossly intact, no focal deficits, moving all 4 extremities. Psychiatric: Normal mood and affect.  Laboratory Data: Lab Results  Component Value Date   WBC 5.9 04/03/2020   HGB 11.9 (L) 04/03/2020   HCT 38.7 (L) 04/03/2020   MCV 92.1 04/03/2020   PLT 175 04/03/2020    Lab Results  Component Value Date   CREATININE 1.36 (H) 03/03/2020    No results found for: PSA  No results found for: TESTOSTERONE  Lab Results  Component Value Date   HGBA1C (H) 02/08/2011    5.7 (NOTE)                                                                       According to the ADA Clinical Practice Recommendations for 2011, when HbA1c is used as a screening test:   >=6.5%   Diagnostic of Diabetes Mellitus           (if abnormal result  is confirmed)  5.7-6.4%   Increased risk of developing Diabetes Mellitus  References:Diagnosis and Classification of Diabetes Mellitus,Diabetes [id number] 1):S62-S69 and Standards of Medical Care in         Diabetes - 2011,Diabetes Care,2011,34  (Suppl 1):S11-S61.    Urinalysis    Component Value  Date/Time   COLORURINE STRAW (A) 02/22/2020 1929   APPEARANCEUR CLEAR 02/22/2020 1929   LABSPEC 1.008 02/22/2020 1929   PHURINE 6.0 02/22/2020 [place] NEGATIVE 02/22/2020 [place] NEGATIVE 02/22/2020 1929   BILIRUBINUR neg 04/07/2020 [place] 02/22/2020 1929   PROTEINUR Positive (A) 04/07/2020 1426   PROTEINUR NEGATIVE 02/22/2020 1929   UROBILINOGEN negative (A) 04/07/2020 1426   UROBILINOGEN 0.2 03/23/2012 2041   NITRITE neg 04/07/2020 1426   NITRITE NEGATIVE 02/22/2020  1929   LEUKOCYTESUR Negative 04/07/2020 1426   LEUKOCYTESUR NEGATIVE 02/22/2020 1929    Lab Results  Component Value Date   BACTERIA NONE SEEN 02/19/2020    Pertinent Imaging:  Results for orders placed during the hospital encounter of 02/07/11  DG Abd 1 View   Narrative *RADIOLOGY REPORT*  Clinical Data: Abdominal pain.  ABDOMEN - 1 VIEW  Comparison: CT 02/07/2011.  Findings: Contrast from previous CT has progressed into the colon. There is contrast from the previous CT and urinary bladder.  There is no evidence of bowel obstruction.  No opaque calculi are seen. There is slight scoliosis convexity to the left.  IMPRESSION: No acute or active abdominal process is identified.  Original Report Authenticated By: Delane Ginger, M.D.   No results found for this or any previous visit. No results found for this or any previous visit. No results found for this or any previous visit. Results for orders placed during the hospital encounter of 02/22/20  US RENAL   Narrative CLINICAL DATA:  Acute kidney injury.  EXAM: RENAL / URINARY TRACT ULTRASOUND COMPLETE  COMPARISON:  01/17/2020 CT abdomen/pelvis.  FINDINGS: Right Kidney:  Renal measurements: 11.2 x 4.8 x 6.0 cm = volume: 169 mL. Normal renal parenchymal echogenicity and thickness. No hydronephrosis. Multiple simple appearing right renal cysts, largest 7.6 x 5.0 x 5.9 cm in the upper right kidney.  Left  Kidney:  Renal measurements: 10.8 x 6.1 x 5.3 cm = volume: 182 mL. Normal renal parenchymal echogenicity and thickness. No hydronephrosis. Multiple simple appearing left renal cysts, largest 4.2 x 3.3 x 4.7 cm in the lateral lower left kidney.  Bladder:  Appears normal for degree of bladder distention. Ureteral jets demonstrated bilaterally in the bladder.  Other:  Enlarged prostate measuring approximately 6.7 x 4.2 x 5.2 cm.  IMPRESSION: 1. No hydronephrosis. 2. Simple bilateral renal cysts. 3. Prostatomegaly. Please see recent 01/17/2020 CT report for further details regarding the prostate.   Electronically Signed   By: Ilona Sorrel M.D.   On: 02/22/2020 17:31    No results found for this or any previous visit. No results found for this or any previous visit. No results found for this or any previous visit.  Assessment & Plan:    1. Elevated PSa suspicion for Prostate cancer ([place]) Schedule for prostate biopsy -rx for levaquin given - POCT urinalysis dipstick   No follow-ups on file.  Nicolette Bang, MD  [facility] [place]

## 2020-04-10 ENCOUNTER — Other Ambulatory Visit: Payer: Self-pay

## 2020-04-12 NOTE — Assessment & Plan Note (Signed)
1.  Stage IIb (PT4APN0) cecal cancer: -Right hemicolectomy on 02/04/2020.  PT4APN0, MMR preserved.  0/19 lymph nodes involved.  Margins negative.  Grade 2. -He also had colon cancer in 1999 and underwent surgery without any need for chemotherapy. -We reviewed his labs.  CEA is within normal limits. -CT CAP on 01/17/2020 showed irregular wall thickening involving the medial wall of the cecum extending into the terminal ileum.  No evidence of metastatic disease.  Asymmetric mass involving the right lobe of the prostate gland with extension into the right seminal vesicle and bladder base suspicious for prostate cancer.  Stable biliary dilation and bilateral renal cysts.  Age-indeterminate T12 compression deformity. -We will plan to see him back with repeat CEA level in 3 months.  2.  Iron deficiency state: -He has normocytic anemia, combination from CKD and relative iron deficiency. -He received Feraheme on 03/05/2020 and 03/12/2020. -Hemoglobin on 04/03/2020 was 11.9.  I plan to repeat his ferritin, iron panel and CBC in 3 months.  3.  Most likely prostate cancer: His PSA was elevated at 133.4. -He has follow-up with Dr. McKenzie next week possible biopsy. 

## 2020-04-12 NOTE — Progress Notes (Signed)
Tony Holt, [place] [address]   CLINIC:  Medical Oncology/Hematology  PCP:  [name], MD [place] [address] [phone]   REASON FOR VISIT:  Follow-up for colon cancer and iron deficiency anemia.  PRIOR THERAPY: Surgical resection.   CURRENT THERAPY: Observation and intermittent Feraheme.  BRIEF ONCOLOGIC HISTORY:  Oncology History  Cecal cancer ([place])  02/04/2020 Initial Diagnosis   Cecal cancer ([place])   03/03/2020 Cancer Staging   Staging form: Colon and Rectum, AJCC 8th Edition - Clinical stage from 03/03/2020: Stage IIB (cT4a, cN0, cM0) - Signed by Derek Jack, MD on 03/03/2020     CANCER STAGING: Cancer Staging Cecal cancer [facility]) Staging form: Colon and Rectum, AJCC 8th Edition - Clinical stage from 03/03/2020: Stage IIB (cT4a, cN0, cM0) - Signed by Derek Jack, MD on 03/03/2020    INTERVAL HISTORY:  Tony Holt 86 y.o. male seen for follow-up of stage II cecal cancer and iron deficiency state.  Denies any new onset pains.  Appetite is 75%.  Energy levels are 50%.  Chronic pain in the back reported as 8 out of 10.  Received Feraheme on 03/12/2020.  No bleeding per rectum or melena.    REVIEW OF SYSTEMS:  Review of Systems  All other systems reviewed and are negative.    PAST MEDICAL/SURGICAL HISTORY:  Past Medical History:  Diagnosis Date  . BPH (benign prostatic hyperplasia)   . Colon cancer ([place]) 1999  . Dysrhythmia    new onset A Fib  . Essential hypertension   . GERD (gastroesophageal reflux disease)   . Hard of hearing   . Prostate cancer [facility])    Past Surgical History:  Procedure Laterality Date  . BIOPSY  12/28/2019   Procedure: BIOPSY;  Surgeon: Daneil Dolin, MD;  Location: [facility] ENDO SU[facility];  Service: Endoscopy;;  gastric, esophagus,cecal  . CATARACT EXTRACTION W/PHACO  01/18/2013   Procedure: CATARACT EXTRACTION PHACO AND INTRAOCULAR LENS PLACEMENT ([place]);   Surgeon: Tonny Branch, MD;  Location: [facility] ORS;  Service: Ophthalmology;  Laterality: Left;  CDE 17.11  . CATARACT EXTRACTION W/PHACO Right 02/05/2013   Procedure: CATARACT EXTRACTION PHACO AND INTRAOCULAR LENS PLACEMENT ([facility]);  Surgeon: Tonny Branch, MD;  Location: [facility] ORS;  Service: Ophthalmology;  Laterality: Right;  CDE:19.49  . COLON SURGERY  1999   Sigmoid colon cancer, segmental resection by Drs. Smith/Bradford  . COLONOSCOPY  2002   Multiple adenomatous colon polyps  . COLONOSCOPY N/A 12/28/2019   Procedure: COLONOSCOPY;  Surgeon: Daneil Dolin, MD;  Location: [facility] ENDO SUITE;  Service: Endoscopy;  Laterality: N/A;  . ESOPHAGOGASTRODUODENOSCOPY  03/25/2012   Dr. Gala Romney: Severe ulcerative/erosive reflux esophagitis.  Small hiatal hernia.  Abnormal gastric and duodenal mucosa, biopsy showed reactive changes and minimal chronic inflammation but no H. pylori.  . ESOPHAGOGASTRODUODENOSCOPY N/A 12/28/2019   Procedure: ESOPHAGOGASTRODUODENOSCOPY (EGD);  Surgeon: Daneil Dolin, MD;  Location: [facility] ENDO SUITE;  Service: Endoscopy;  Laterality: N/A;  . HERNIA REPAIR  02/2011   Right inguinal hernia repair with mesh, Dr. Geroge Baseman  . HERNIA REPAIR     three total  . PARTIAL COLECTOMY Right 02/04/2020   Procedure: RIGHT HEMI-COLECTOMY;  Surgeon: Virl Cagey, MD;  Location: [facility] ORS;  Service: General;  Laterality: Right;  . POLYPECTOMY  12/28/2019   Procedure: POLYPECTOMY;  Surgeon: Daneil Dolin, MD;  Location: [facility] ENDO SU[facility];  Service: Endoscopy;;  rectal      SOCIAL HISTORY:  Social History  Socioeconomic History  . Marital status: Widowed    Spouse name: Not on file  . Number of children: 5  . Years of education: Not on file  . Highest education level: Not on file  Occupational History  . Occupation: Retired Neurosurgeon: RETIRED  Tobacco Use  . Smoking status: Former Smoker    Packs/day: 1.00    Years: 50.00    Pack years: 50.00    Types: Cigarettes  . Smokeless tobacco:  Current User    Types: Snuff  Substance and Sexual Activity  . Alcohol use: No    Alcohol/week: 0.0 standard drinks    Comment: previous heavy drinker at age [age]  . Drug use: No  . Sexual activity: Not on file  Other Topics Concern  . Not on file  Social History Narrative  . Not on file   Social Determinants of Health   Financial Resource Strain: Low Risk   . Difficulty of Paying Living Expenses: Not hard at all  Food Insecurity: No Food Insecurity  . Worried About Charity fundraiser in the Last Year: Never true  . Ran Out of Food in the Last Year: Never true  Transportation Needs: No Transportation Needs  . Lack of Transportation (Medical): No  . Lack of Transportation (Non-Medical): No  Physical Activity: Insufficiently Active  . Days of Exercise per Week: 3 days  . Minutes of Exercise per Session: 20 min  Stress: No Stress Concern Present  . Feeling of Stress : Only a little  Social Connections: Moderately Isolated  . Frequency of Communication with Friends and Family: More than three times a week  . Frequency of Social Gatherings with Friends and Family: More than three times a week  . Attends Religious Services: Never  . Active Member of Clubs or Organizations: No  . Attends Archivist Meetings: Never  . Marital Status: Widowed  Intimate Partner Violence: Not At Risk  . Fear of Current or Ex-Partner: No  . Emotionally Abused: No  . Physically Abused: No  . Sexually Abused: No    FAMILY HISTORY:  Family History  Problem Relation Age of Onset  . Heart attack Father        Deceased, age 13s  . Diabetes Brother   . Colon cancer Neg Hx   . Liver disease Neg Hx     CURRENT MEDICATIONS:  Outpatient Encounter Medications as of 04/03/2020  Medication Sig  . diphenhydramine-acetaminophen (TYLENOL PM) 25-500 MG TABS tablet Take 2 tablets by mouth at bedtime.   . Ensure (ENSURE) Take 237 mLs by mouth 3 (three) times daily between meals.  Marland Kitchen omeprazole  (PRILOSEC) 20 MG capsule Take 20 mg by mouth 2 (two) times daily.  . Pediatric Multiple Vitamins (FLINTSTONES MULTIVITAMIN PO) Take 1 tablet by mouth daily.   . Probiotic Product (CULTURELLE PROBIOTICS PO) Take by mouth daily.  . Tamsulosin HCl (FLOMAX) 0.4 MG CAPS Take 1 capsule (0.4 mg total) by mouth daily.  Marland Kitchen docusate sodium (COLACE) 100 MG capsule Take 1 capsule (100 mg total) by mouth 2 (two) times daily as needed for mild constipation.  Marland Kitchen HYDROcodone-acetaminophen (NORCO/VICODIN) 5-325 MG tablet Take 1 tablet by mouth every 12 (twelve) hours as needed for moderate pain.   Marland Kitchen ondansetron (ZOFRAN-ODT) 4 MG disintegrating tablet Take 1 tablet (4 mg total) by mouth every 6 (six) hours as needed for nausea. (Patient not taking: Reported on 04/03/2020)   No facility-administered encounter medications on file as  of 04/03/2020.    ALLERGIES:  No Known Allergies   PHYSICAL EXAM:  ECOG Performance status: 2  Vitals:   04/03/20 1614  BP: 126/64  Pulse: 84  Resp: 18  Temp: (!) 96.9 F (36.1 C)  SpO2: 100%   Filed Weights   04/03/20 1614  Weight: 130 lb (59 kg)   Physical Exam Vitals reviewed.  Constitutional:      Appearance: Normal appearance.  Cardiovascular:     Heart sounds: Normal heart sounds.  Pulmonary:     Effort: Pulmonary effort is normal.     Breath sounds: Normal breath sounds.  Abdominal:     General: There is no distension.     Palpations: Abdomen is soft. There is no mass.  Neurological:     General: No focal deficit present.     Mental Status: He is alert and oriented to person, place, and time.  Psychiatric:        Mood and Affect: Mood normal.        Behavior: Behavior normal.      LABORATORY DATA:  I have reviewed the labs as listed.  CBC    Component Value Date/Time   WBC 5.9 04/03/2020 1547   RBC 4.20 (L) 04/03/2020 1547   HGB 11.9 (L) 04/03/2020 1547   HCT 38.7 (L) 04/03/2020 1547   PLT 175 04/03/2020 1547   MCV 92.1 04/03/2020 1547   MCH  28.3 04/03/2020 1547   MCHC 30.7 04/03/2020 1547   RDW 23.2 (H) 04/03/2020 1547   LYMPHSABS 0.9 04/03/2020 1547   MONOABS 0.5 04/03/2020 1547   EOSABS 0.1 04/03/2020 1547   BASOSABS 0.0 04/03/2020 1547   CMP Latest Ref Rng & Units 03/03/2020 02/25/2020 02/24/2020  Glucose 70 - 99 mg/dL 105(H) 83 85  BUN 8 - 23 mg/dL 21 28(H) 29(H)  Creatinine 0.61 - 1.24 mg/dL 1.36(H) 1.85(H) 1.80(H)  Sodium 135 - 145 mmol/L 138 139 136  Potassium 3.5 - 5.1 mmol/L 4.1 4.9 6.0(H)  Chloride 98 - 111 mmol/L 107 110 111  CO2 22 - 32 mmol/L 24 21(L) 18(L)  Calcium 8.9 - 10.3 mg/dL 9.0 9.1 8.9  Total Protein 6.5 - 8.1 g/dL - - -  Total Bilirubin 0.3 - 1.2 mg/dL - - -  Alkaline Phos 38 - 126 U/L - - -  AST 15 - 41 U/L - - -  ALT 0 - 44 U/L - - -    DIAGNOSTIC IMAGING:  I have independently reviewed the scans and discussed with the patient.  ASSESSMENT & PLAN:  Cecal cancer ([facility]) 1.  Stage IIb (PT4APN0) cecal cancer: -Right hemicolectomy on 02/04/2020.  PT4APN0, MMR preserved.  0/19 lymph nodes involved.  Margins negative.  Grade 2. -He also had colon cancer in 1999 and underwent surgery without any need for chemotherapy. -We reviewed his labs.  CEA is within normal limits. -CT CAP on 01/17/2020 showed irregular wall thickening involving the medial wall of the cecum extending into the terminal ileum.  No evidence of metastatic disease.  Asymmetric mass involving the right lobe of the prostate gland with extension into the right seminal vesicle and bladder base suspicious for prostate cancer.  Stable biliary dilation and bilateral renal cysts.  Age-indeterminate T12 compression deformity. -We will plan to see him back with repeat CEA level in 3 months.  2.  Iron deficiency state: -He has normocytic anemia, combination from CKD and relative iron deficiency. -He received Feraheme on 03/05/2020 and 03/12/2020. -Hemoglobin on 04/03/2020 was 11.9.  I plan to repeat his ferritin, iron panel and CBC in 3 months.  3.   Most likely prostate cancer: His PSA was elevated at 133.4. -He has follow-up with Dr. Alyson Ingles next week possible biopsy.     Orders placed this encounter:  Orders Placed This Encounter  Procedures  . CBC with Differential  . Comprehensive metabolic panel  . CEA  . PSA  . Iron and TIBC  . Ferritin      Derek Jack, MD [place] [phone]

## 2020-04-23 ENCOUNTER — Other Ambulatory Visit: Payer: Self-pay | Admitting: Urology

## 2020-04-23 ENCOUNTER — Other Ambulatory Visit: Payer: Self-pay

## 2020-04-23 ENCOUNTER — Ambulatory Visit (INDEPENDENT_AMBULATORY_CARE_PROVIDER_SITE_OTHER): Payer: Medicare HMO | Admitting: Urology

## 2020-04-23 ENCOUNTER — Ambulatory Visit (HOSPITAL_COMMUNITY)
Admission: RE | Admit: 2020-04-23 | Discharge: 2020-04-23 | Disposition: A | Discharge status: Home / Self Care | Payer: Medicare HMO | Source: Ambulatory Visit | Attending: Urology | Admitting: Urology

## 2020-04-23 DIAGNOSIS — C61 Malignant neoplasm of prostate: Secondary | ICD-10-CM

## 2020-04-23 DIAGNOSIS — R972 Elevated prostate specific antigen [PSA]: Secondary | ICD-10-CM | POA: Diagnosis not present

## 2020-04-23 MED ORDER — LIDOCAINE HCL (PF) 2 % IJ SOLN
INTRAMUSCULAR | Status: AC
  Administered 2020-04-23: 10 mL
  Filled 2020-04-23: qty 10

## 2020-04-23 MED ORDER — GENTAMICIN SULFATE 40 MG/ML IJ SOLN: 80.0000 mg | Freq: Once | INTRAMUSCULAR | Status: AC

## 2020-04-23 MED ORDER — GENTAMICIN SULFATE 40 MG/ML IJ SOLN
INTRAMUSCULAR | Status: AC
  Administered 2020-04-23: 13:00:00 80 mg via INTRAMUSCULAR
  Filled 2020-04-23: qty 2

## 2020-04-23 NOTE — Progress Notes (Signed)
Prostate Biopsy Procedure   Informed consent was obtained after discussing risks/benefits of the procedure.  A time out was performed to ensure correct patient identity.  Pre-Procedure: - Last PSA Level: No results found for: PSA - Gentamicin given prophylactically - Levaquin 500 mg administered PO -Transrectal Ultrasound performed revealing a 42 gm prostate -No significant hypoechoic or median lobe noted  Procedure: - Prostate block performed using 10 cc 1% lidocaine and biopsies taken from sextant areas, a total of 12 under ultrasound guidance.  Post-Procedure: - Patient tolerated the procedure well - He was counseled to seek immediate medical attention if experiences any severe pain, significant bleeding, or fevers - Return in one week to discuss biopsy results

## 2020-04-28 DIAGNOSIS — R972 Elevated prostate specific antigen [PSA]: Secondary | ICD-10-CM | POA: Insufficient documentation

## 2020-05-01 ENCOUNTER — Telehealth: Payer: Self-pay

## 2020-05-01 NOTE — Telephone Encounter (Signed)
-----   Message from Cleon Gustin, MD sent at 05/01/2020 10:38 AM EDT ----- Have him see me next week ----- Message ----- From: Dorisann Frames, RN Sent: 04/28/2020   9:08 AM EDT To: Cleon Gustin, MD  Biopsy results

## 2020-05-01 NOTE — Progress Notes (Signed)
Left message to return call for appt next Monday at 3:45

## 2020-05-02 NOTE — Telephone Encounter (Signed)
Lmtrc. No answer x2

## 2020-05-06 DIAGNOSIS — C187 Malignant neoplasm of sigmoid colon: Secondary | ICD-10-CM | POA: Diagnosis not present

## 2020-05-06 DIAGNOSIS — M545 Low back pain: Secondary | ICD-10-CM | POA: Diagnosis not present

## 2020-05-06 DIAGNOSIS — C61 Malignant neoplasm of prostate: Secondary | ICD-10-CM | POA: Diagnosis not present

## 2020-05-06 DIAGNOSIS — Z79891 Long term (current) use of opiate analgesic: Secondary | ICD-10-CM | POA: Diagnosis not present

## 2020-05-14 NOTE — Telephone Encounter (Signed)
Called pt. Both numbers listed. No answer. Mail box full. Pt has my chart- will notify via mychart prostate biopsy results appt created.

## 2020-06-04 ENCOUNTER — Encounter: Payer: Self-pay | Admitting: Urology

## 2020-06-04 ENCOUNTER — Other Ambulatory Visit: Payer: Self-pay

## 2020-06-04 ENCOUNTER — Ambulatory Visit (INDEPENDENT_AMBULATORY_CARE_PROVIDER_SITE_OTHER): Payer: Medicare HMO | Admitting: Urology

## 2020-06-04 VITALS — BP 165/84 | HR 73 | Temp 98.4°F | Ht 68.0 in | Wt 128.0 lb

## 2020-06-04 DIAGNOSIS — C61 Malignant neoplasm of prostate: Secondary | ICD-10-CM | POA: Diagnosis not present

## 2020-06-04 NOTE — Patient Instructions (Signed)
Prostate Cancer  The prostate is a male gland that helps make semen. Prostate cancer is when abnormal cells grow in this gland. Follow these instructions at home:  Take over-the-counter and prescription medicines only as told by your doctor.  Eat a healthy diet.  Get plenty of sleep.  Ask your doctor for help to find a support group for men with prostate cancer.  Keep all follow-up visits as told by your doctor. This is important.  If you have to go to the hospital, let your cancer doctor (oncologist) know.  Touch, hold, hug, and caress your partner to continue to show sexual feelings. Contact a doctor if:  You have trouble peeing (urinating).  You have blood in your pee (urine).  You have pain in your hips, back, or chest. Get help right away if:  You have weakness in your legs.  You lose feeling (have numbness) in your legs.  You cannot control your pee or your poop (stool).  You have trouble breathing.  You have sudden pain in your chest.  You have chills or a fever. Summary  The prostate is a male gland that helps make semen. Prostate cancer is when abnormal cells grow in this gland.  Ask your doctor for help to find a support group for men with prostate cancer.  Contact a doctor if you have problems peeing or have any new pain that you did not have before. This information is not intended to replace advice given to you by your health care provider. Make sure you discuss any questions you have with your health care provider. Document Revised: 11/11/2017 Document Reviewed: 08/09/2016 Elsevier Patient Education  2020 Elsevier Inc.  

## 2020-06-04 NOTE — Progress Notes (Signed)
06/04/2020 4:08 PM   Tony Holt [birth year] [id number]  Referring provider: [name], MD [address] [place],  [place] [address]  F/u prostate biopsy  HPI: Tony Holt is a 86yo here for followup after prostate biopsy. Prostate biopsy from 5/12 was Gleason 4+5=9 in 6/12 cores all on right side. PSA 133. No new bone pain. CT from 01/2020 showed no evidence of metastatic disease.  Marland Kitchen  PMH: Past Medical History:  Diagnosis Date  . BPH (benign prostatic hyperplasia)   . Colon cancer ([place]) 1999  . Dysrhythmia    new onset A Fib  . Essential hypertension   . GERD (gastroesophageal reflux disease)   . Hard of hearing   . Prostate cancer [facility])     Surgical History: Past Surgical History:  Procedure Laterality Date  . BIOPSY  12/28/2019   Procedure: BIOPSY;  Surgeon: Daneil Dolin, MD;  Location[facility];  Service: Endoscopy;;  gastric, esophagus,cecal  . CATARACT EXTRACTION W/PHACO  01/18/2013   Procedure: CATARACT EXTRACTION PHACO AND INTRAOCULAR LENS PLACEMENT ([place]);  Surgeon: Tonny Branch, MD;  Location: [facility] ORS;  Service: Ophthalmology;  Laterality: Left;  CDE 17.11  . CATARACT EXTRACTION W/PHACO Right 02/05/2013   Procedure: CATARACT EXTRACTION PHACO AND INTRAOCULAR LENS PLACEMENT ([facility]);  Surgeon: Tonny Branch, MD;  Location: [facility] ORS;  Service: Ophthalmology;  Laterality: Right;  CDE:19.49  . COLON SURGERY  1999   Sigmoid colon cancer, segmental resection by Drs. Smith/Bradford  . COLONOSCOPY  2002   Multiple adenomatous colon polyps  . COLONOSCOPY N/A 12/28/2019   Procedure: COLONOSCOPY;  Surgeon: Daneil Dolin, MD;  Location: [facility];  Service: Endoscopy;  Laterality: N/A;  . ESOPHAGOGASTRODUODENOSCOPY  03/25/2012   Dr. Gala Romney: Severe ulcerative/erosive reflux esophagitis.  Small hiatal hernia.  Abnormal gastric and duodenal mucosa, biopsy showed reactive changes and minimal chronic inflammation but no H. pylori.  . ESOPHAGOGASTRODUODENOSCOPY N/A 12/28/2019    Procedure: ESOPHAGOGASTRODUODENOSCOPY (EGD);  Surgeon: Daneil Dolin, MD;  Location: [facility];  Service: Endoscopy;  Laterality: N/A;  . HERNIA REPAIR  02/2011   Right inguinal hernia repair with mesh, Dr. Geroge Baseman  . HERNIA REPAIR     three total  . PARTIAL COLECTOMY Right 02/04/2020   Procedure: RIGHT HEMI-COLECTOMY;  Surgeon: Virl Cagey, MD;  Location: [facility] ORS;  Service: General;  Laterality: Right;  . POLYPECTOMY  12/28/2019   Procedure: POLYPECTOMY;  Surgeon: Daneil Dolin, MD;  Location: [facility];  Service: Endoscopy;;  rectal     Home Medications:  Allergies as of 06/04/2020   No Known Allergies     Medication List       Accurate as of June 04, 2020  4:08 PM. If you have any questions, ask your nurse or doctor.        CULTURELLE PROBIOTICS PO Take by mouth daily.   diphenhydramine-acetaminophen 25-500 MG Tabs tablet Commonly known as: TYLENOL PM Take 2 tablets by mouth at bedtime.   docusate sodium 100 MG capsule Commonly known as: COLACE Take 1 capsule (100 mg total) by mouth 2 (two) times daily as needed for mild constipation.   Ensure Take 237 mLs by mouth 3 (three) times daily between meals.   FLINTSTONES MULTIVITAMIN PO Take 1 tablet by mouth daily.   HYDROcodone-acetaminophen 5-325 MG tablet Commonly known as: NORCO/VICODIN Take 1 tablet by mouth every 12 (twelve) hours as needed for moderate pain.   levofloxacin 750 MG tablet Commonly known as: Levaquin Take 1  tablet (750 mg total) by mouth daily.   omeprazole 20 MG capsule Commonly known as: PRILOSEC Take 20 mg by mouth 2 (two) times daily.   ondansetron 4 MG disintegrating tablet Commonly known as: ZOFRAN-ODT Take 1 tablet (4 mg total) by mouth every 6 (six) hours as needed for nausea.   tamsulosin 0.4 MG Caps capsule Commonly known as: FLOMAX Take 1 capsule (0.4 mg total) by mouth daily.       Allergies: No Known Allergies  Family History: Family History  Problem  Relation Age of Onset  . Heart attack Father        Deceased, age 71s  . Diabetes Brother   . Colon cancer Neg Hx   . Liver disease Neg Hx     Social History:  reports that he has quit smoking. His smoking use included cigarettes. He has a 50.00 pack-year smoking history. His smokeless tobacco use includes snuff. He reports that he does not drink alcohol and does not use drugs.  ROS: All other review of systems were reviewed and are negative except what is noted above in HPI  Physical Exam: BP (!) 165/84   Pulse 73   Temp 98.4 F (36.9 C)   Ht 5\' 8"  (1.727 m)   Wt 128 lb (58.1 kg)   BMI 19.46 kg/m   Constitutional:  Alert and oriented, No acute distress. HEENT: [place] AT, moist mucus membranes.  Trachea midline, no masses. Cardiovascular: No clubbing, cyanosis, or edema. Respiratory: Normal respiratory effort, no increased work of breathing. GI: Abdomen is soft, nontender, nondistended, no abdominal masses GU: No CVA tenderness.  Lymph: No cervical or inguinal lymphadenopathy. Skin: No rashes, bruises or suspicious lesions. Neurologic: Grossly intact, no focal deficits, moving all 4 extremities. Psychiatric: Normal mood and affect.  Laboratory Data: Lab Results  Component Value Date   WBC 5.9 04/03/2020   HGB 11.9 (L) 04/03/2020   HCT 38.7 (L) 04/03/2020   MCV 92.1 04/03/2020   PLT 175 04/03/2020    Lab Results  Component Value Date   CREATININE 1.36 (H) 03/03/2020    No results found for: PSA  No results found for: TESTOSTERONE  Lab Results  Component Value Date   HGBA1C (H) 02/08/2011    5.7 (NOTE)                                                                       According to the ADA Clinical Practice Recommendations for 2011, when HbA1c is used as a screening test:   >=6.5%   Diagnostic of Diabetes Mellitus           (if abnormal result  is confirmed)  5.7-6.4%   Increased risk of developing Diabetes Mellitus  References:Diagnosis and Classification of  Diabetes Mellitus,Diabetes CZYS,0630,16(WFUXN 1):S62-S69 and Standards of Medical Care in         Diabetes - 2011,Diabetes Care,2011,34  (Suppl 1):S11-S61.    Urinalysis    Component Value Date/Time   COLORURINE STRAW (A) 02/22/2020 1929   APPEARANCEUR CLEAR 02/22/2020 1929   LABSPEC 1.008 02/22/2020 1929   PHURINE 6.0 02/22/2020 [place] NEGATIVE 02/22/2020 [place] NEGATIVE 02/22/2020 [place] neg 04/07/2020 [place] 02/22/2020 1929  PROTEINUR Positive (A) 04/07/2020 1426   PROTEINUR NEGATIVE 02/22/2020 1929   UROBILINOGEN negative (A) 04/07/2020 1426   UROBILINOGEN 0.2 03/23/2012 2041   NITRITE neg 04/07/2020 1426   NITRITE NEGATIVE 02/22/2020 1929   LEUKOCYTESUR Negative 04/07/2020 1426   LEUKOCYTESUR NEGATIVE 02/22/2020 1929    Lab Results  Component Value Date   BACTERIA NONE SEEN 02/19/2020    Pertinent Imaging: CT 01/17/2020: Images reviewed and discussed with patient and family. Results for orders placed during the hospital encounter of 02/07/11  DG Abd 1 View  Narrative *RADIOLOGY REPORT*  Clinical Data: Abdominal pain.  ABDOMEN - 1 VIEW  Comparison: CT 02/07/2011.  Findings: Contrast from previous CT has progressed into the colon. There is contrast from the previous CT and urinary bladder.  There is no evidence of bowel obstruction.  No opaque calculi are seen. There is slight scoliosis convexity to the left.  IMPRESSION: No acute or active abdominal process is identified.  Original Report Authenticated By: Delane Ginger, M.D.  No results found for this or any previous visit.  No results found for this or any previous visit.  No results found for this or any previous visit.  Results for orders placed during the hospital encounter of 02/22/20  US RENAL  Narrative CLINICAL DATA:  Acute kidney injury.  EXAM: RENAL / URINARY TRACT ULTRASOUND COMPLETE  COMPARISON:  01/17/2020 CT  abdomen/pelvis.  FINDINGS: Right Kidney:  Renal measurements: 11.2 x 4.8 x 6.0 cm = volume: 169 mL. Normal renal parenchymal echogenicity and thickness. No hydronephrosis. Multiple simple appearing right renal cysts, largest 7.6 x 5.0 x 5.9 cm in the upper right kidney.  Left Kidney:  Renal measurements: 10.8 x 6.1 x 5.3 cm = volume: 182 mL. Normal renal parenchymal echogenicity and thickness. No hydronephrosis. Multiple simple appearing left renal cysts, largest 4.2 x 3.3 x 4.7 cm in the lateral lower left kidney.  Bladder:  Appears normal for degree of bladder distention. Ureteral jets demonstrated bilaterally in the bladder.  Other:  Enlarged prostate measuring approximately 6.7 x 4.2 x 5.2 cm.  IMPRESSION: 1. No hydronephrosis. 2. Simple bilateral renal cysts. 3. Prostatomegaly. Please see recent 01/17/2020 CT report for further details regarding the prostate.   Electronically Signed By: Ilona Sorrel M.D. On: 02/22/2020 17:31  No results found for this or any previous visit.  No results found for this or any previous visit.  No results found for this or any previous visit.   Assessment & Plan:    1. Prostate cancer ([place]) -We discussed the management of high risk prostate cancer including surveillance, IMRT for localized cancer and ADT. I will obtain a bone scan prior to starting ADT. RTC 2 weeks   No follow-ups on file.  Nicolette Bang, MD  [facility] [place]

## 2020-06-04 NOTE — Progress Notes (Signed)

## 2020-06-05 ENCOUNTER — Other Ambulatory Visit (HOSPITAL_COMMUNITY)
Admission: RE | Admit: 2020-06-05 | Discharge: 2020-06-05 | Disposition: A | Discharge status: Home / Self Care | Payer: Medicare HMO | Source: Ambulatory Visit | Attending: Family Medicine | Admitting: Family Medicine

## 2020-06-05 DIAGNOSIS — C61 Malignant neoplasm of prostate: Secondary | ICD-10-CM | POA: Insufficient documentation

## 2020-06-05 LAB — BASIC METABOLIC PANEL
Anion gap: 10 (ref 5–15)
BUN: 23 mg/dL (ref 8–23)
CO2: 25 mmol/L (ref 22–32)
Calcium: 9.6 mg/dL (ref 8.9–10.3)
Chloride: 104 mmol/L (ref 98–111)
Creatinine, Ser: 1.31 mg/dL — ABNORMAL HIGH (ref 0.61–1.24)
GFR calc Af Amer: 55 mL/min — ABNORMAL LOW (ref >60)
GFR calc non Af Amer: 47 mL/min — ABNORMAL LOW (ref >60)
Glucose, Bld: 78 mg/dL (ref 70–99)
Potassium: 4.3 mmol/L (ref 3.5–5.1)
Sodium: 139 mmol/L (ref 135–145)

## 2020-06-12 ENCOUNTER — Encounter (HOSPITAL_COMMUNITY): Payer: Self-pay

## 2020-06-12 ENCOUNTER — Encounter (HOSPITAL_COMMUNITY)
Admission: RE | Admit: 2020-06-12 | Discharge: 2020-06-12 | Disposition: A | Discharge status: Home / Self Care | Payer: Medicare HMO | Source: Ambulatory Visit | Attending: Urology | Admitting: Urology

## 2020-06-12 ENCOUNTER — Other Ambulatory Visit: Payer: Self-pay

## 2020-06-12 DIAGNOSIS — C61 Malignant neoplasm of prostate: Secondary | ICD-10-CM | POA: Insufficient documentation

## 2020-06-12 MED ORDER — TECHNETIUM TC 99M MEDRONATE IV KIT
20.0000 | PACK | Freq: Once | INTRAVENOUS | Status: AC | PRN
  Administered 2020-06-12: 21.8 via INTRAVENOUS

## 2020-06-18 ENCOUNTER — Ambulatory Visit (INDEPENDENT_AMBULATORY_CARE_PROVIDER_SITE_OTHER): Payer: Medicare HMO | Admitting: Urology

## 2020-06-18 ENCOUNTER — Other Ambulatory Visit: Payer: Self-pay

## 2020-06-18 DIAGNOSIS — C61 Malignant neoplasm of prostate: Secondary | ICD-10-CM | POA: Diagnosis not present

## 2020-06-18 MED ORDER — DEGARELIX ACETATE(240 MG DOSE) 120 MG/VIAL ~~LOC~~ SOLR
240.0000 mg | Freq: Once | SUBCUTANEOUS | Status: AC
  Administered 2020-06-18: 240 mg via SUBCUTANEOUS

## 2020-06-18 NOTE — Progress Notes (Signed)
Firmagon Sub Q Injection  Due to Prostate Cancer patient is present today for a Firmagon Injection.   Medication: Mills Koller (Degarelix)  Dose: 240mg  Location: right & right upper abdomen Lot: [id number] Exp: 04/2022 Patient tolerated well, no complications were noted  Performed by: Dezmond Downie, LPN  Follow up: 1  Month OV with Lupron or [place]

## 2020-06-24 ENCOUNTER — Encounter: Payer: Self-pay | Admitting: Medical Oncology

## 2020-06-24 ENCOUNTER — Ambulatory Visit
Admission: RE | Admit: 2020-06-24 | Discharge: 2020-06-24 | Disposition: A | Discharge status: Home / Self Care | Payer: Medicare HMO | Source: Ambulatory Visit | Attending: Radiation Oncology | Admitting: Radiation Oncology

## 2020-06-24 ENCOUNTER — Encounter: Payer: Self-pay | Admitting: Radiation Oncology

## 2020-06-24 ENCOUNTER — Other Ambulatory Visit: Payer: Self-pay

## 2020-06-24 DIAGNOSIS — C61 Malignant neoplasm of prostate: Secondary | ICD-10-CM | POA: Diagnosis not present

## 2020-06-24 DIAGNOSIS — R972 Elevated prostate specific antigen [PSA]: Secondary | ICD-10-CM | POA: Diagnosis not present

## 2020-06-24 DIAGNOSIS — Z85038 Personal history of other malignant neoplasm of large intestine: Secondary | ICD-10-CM | POA: Diagnosis not present

## 2020-06-24 NOTE — Progress Notes (Signed)
Radiation Oncology         [phone] ________________________________  Initial outpatient Consultation  Name: Tony Holt MRN: [id number]  Date: 06/24/2020  DOB: 1926/09/13  RX:[id number], Tony Main, MD  Tony Holt, Tony Furbish, MD   REFERRING PHYSICIAN: Cleon Gustin, MD  DIAGNOSIS: 86 y.o. gentleman with Stage T4a adenocarcinoma of the prostate with Gleason score of 5+4, and PSA of 133.    ICD-10-CM   1. Prostate cancer [facility])  C61     HISTORY OF PRESENT ILLNESS: Tony Holt is a 86 y.o. male with a diagnosis of prostate cancer.  He has a history of colon cancer status post partial colectomy in 1999 without any further recurrence.  However, he was recently being worked up for iron deficiency anemia with a colonoscopy on 12/28/2019 with Dr. Blake Holt that revealed a cecal mass suspicious for carcinoma.  He had a CT A/P on 01/17/2020 for disease staging and this was without evidence of metastatic disease but demonstrated an asymmetric mass involving the right side of the prostate with extension into the right seminal vesicle and bladder base, suspicious for prostate cancer.  He proceeded with right partial colectomy on 02/04/2020 with final surgical pathology confirming pT4N0, invasive, moderately differentiated adenocarcinoma involving the ileocecal valve and cecum with negative margins (he stage II colon cancer).  He was seen in consultation with Dr. Delton Holt on 03/03/2020 who recommended proceeding in observation regarding the colon cancer and obtaining a PSA for further evaluation of the abnormal prostate findings on CT A/P.  PSA on 03/03/2020 was significantly elevated at 145.  This was repeated on 04/03/2020 and remained significantly elevated at 133.41.  He was referred for evaluation in urology with Dr. Alyson Holt on 04/07/2020 and proceeded to transrectal ultrasound with 12 biopsies of the prostate on 04/23/2020.  The prostate volume measured 42 cc.  Out of 12 core biopsies, all 6  right-sided cores were positive.  The maximum Gleason score was (5+4)=9, and this was seen in the right base and right mid prostate.  Additionally, Gleason 4+5 disease was noted in the right base lateral, right mid lateral, right apex and right apex lateral.  A bone scan was performed on 06/12/2020 to complete his disease staging and was without any evidence of osseous metastatic disease.    The patient reviewed the biopsy results with his urologist and he has kindly been referred today for discussion of potential radiation treatment options. He was started on ADT with Firmagon on 06/18/2020 and has a scheduled follow-up visit on 07/24/2020 for a 82-month Lupron injection.   PREVIOUS RADIATION THERAPY: No  PAST MEDICAL HISTORY:  Past Medical History:  Diagnosis Date  . BPH (benign prostatic hyperplasia)   . Colon cancer ([place]) 1999  . Dysrhythmia    new onset A Fib  . Essential hypertension   . GERD (gastroesophageal reflux disease)   . Hard of hearing   . Prostate cancer ([place])       PAST SURGICAL HISTORY: Past Surgical History:  Procedure Laterality Date  . BIOPSY  12/28/2019   Procedure: BIOPSY;  Surgeon: Daneil Dolin, MD;  [facility];  Service: Endoscopy;;  gastric, esophagus,cecal  . CATARACT EXTRACTION W/PHACO  01/18/2013   Procedure: CATARACT EXTRACTION PHACO AND INTRAOCULAR LENS PLACEMENT ([place]);  Surgeon: Tonny Branch, MD;  Location: [facility];  Service: Ophthalmology;  Laterality: Left;  CDE 17.11  . CATARACT EXTRACTION W/PHACO Right 02/05/2013   Procedure: CATARACT EXTRACTION PHACO AND INTRAOCULAR LENS PLACEMENT (I[facility]);  Surgeon:  Tonny Branch, MD;  Location: [facility];  Service: Ophthalmology;  Laterality: Right;  CDE:19.49  . COLON SURGERY  1999   Sigmoid colon cancer, segmental resection by Drs. Smith/[place]  . COLONOSCOPY  2002   Multiple adenomatous colon polyps  . COLONOSCOPY N/A 12/28/2019   Procedure: COLONOSCOPY;  Surgeon: Daneil Dolin, MD;  [facility];   Service: Endoscopy;  Laterality: N/A;  . ESOPHAGOGASTRODUODENOSCOPY  03/25/2012   Dr. Gala [place]: Severe ulcerative/erosive reflux esophagitis.  Small hiatal hernia.  Abnormal gastric and duodenal mucosa, biopsy showed reactive changes and minimal chronic inflammation but no H. pylori.  . ESOPHAGOGASTRODUODENOSCOPY N/A 12/28/2019   Procedure: ESOPHAGOGASTRODUODENOSCOPY (EGD);  Surgeon: Daneil Dolin, MD;  [facility];  Service: Endoscopy;  Laterality: N/A;  . HERNIA REPAIR  02/2011   Right inguinal hernia repair with mesh, Dr. Geroge Baseman  . HERNIA REPAIR     three total  . PARTIAL COLECTOMY Right 02/04/2020   Procedure: RIGHT HEMI-COLECTOMY;  Surgeon: Virl Cagey, MD;  Location: [facility];  Service: General;  Laterality: Right;  . POLYPECTOMY  12/28/2019   Procedure: POLYPECTOMY;  Surgeon: Daneil Dolin, MD;  [facility];  Service: Endoscopy;;  rectal     FAMILY HISTORY:  Family History  Problem Relation Age of Onset  . Heart attack Father        Deceased, age 54s  . Diabetes Brother   . Colon cancer Neg Hx   . Liver disease Neg Hx   . Breast cancer Neg Hx   . Prostate cancer Neg Hx   . Pancreatic cancer Neg Hx     SOCIAL HISTORY:  Social History   Socioeconomic History  . Marital status: Widowed    Spouse name: Not on file  . Number of children: 5  . Years of education: Not on file  . Highest education level: Not on file  Occupational History  . Occupation: Retired Neurosurgeon: RETIRED  Tobacco Use  . Smoking status: Former Smoker    Packs/day: 1.00    Years: 50.00    Pack years: 50.00    Types: Cigarettes    Quit date: 12/13/1989    Years since quitting: 30.5  . Smokeless tobacco: Current User    Types: Snuff  Vaping Use  . Vaping Use: Never used  Substance and Sexual Activity  . Alcohol use: No    Alcohol/week: 0.0 standard drinks    Comment: previous heavy drinker at age 65-32  . Drug use: No  . Sexual activity: Not Currently    Other Topics Concern  . Not on file  Social History Narrative  . Not on file   Social Determinants of Health   Financial Resource Strain: Low Risk   . Difficulty of Paying Living Expenses: Not hard at all  Food Insecurity: No Food Insecurity  . Worried About Charity fundraiser in the Last Year: Never true  . Ran Out of Food in the Last Year: Never true  Transportation Needs: No Transportation Needs  . Lack of Transportation (Medical): No  . Lack of Transportation (Non-Medical): No  Physical Activity: Insufficiently Active  . Days of Exercise per Week: 3 days  . Minutes of Exercise per Session: 20 min  Stress: No Stress Concern Present  . Feeling of Stress : Only a little  Social Connections: Socially Isolated  . Frequency of Communication with Friends and Family: More than three times a week  . Frequency of  Social Gatherings with Friends and Family: More than three times a week  . Attends Religious Services: Never  . Active Member of Clubs or Organizations: No  . Attends Archivist Meetings: Never  . Marital Status: Widowed  Intimate Partner Violence: Not At Risk  . Fear of Current or Ex-Partner: No  . Emotionally Abused: No  . Physically Abused: No  . Sexually Abused: No    ALLERGIES: Patient has no known allergies.  MEDICATIONS:  Current Outpatient Medications  Medication Sig Dispense Refill  . diphenhydramine-acetaminophen (TYLENOL PM) 25-500 MG TABS tablet Take 2 tablets by mouth at bedtime.     . docusate sodium (COLACE) 100 MG capsule Take 1 capsule (100 mg total) by mouth 2 (two) times daily as needed for mild constipation. 60 capsule 1  . Ensure (ENSURE) Take 237 mLs by mouth 3 (three) times daily between meals.    Marland Kitchen HYDROcodone-acetaminophen (NORCO/VICODIN) 5-325 MG tablet Take 1 tablet by mouth every 12 (twelve) hours as needed for moderate pain.   0  . omeprazole (PRILOSEC) 20 MG capsule Take 20 mg by mouth 2 (two) times daily.  11  . Pediatric  Multiple Vitamins (FLINTSTONES MULTIVITAMIN PO) Take 1 tablet by mouth daily.     . Probiotic Product (CULTURELLE PROBIOTICS PO) Take by mouth daily.    . Tamsulosin HCl (FLOMAX) 0.4 MG CAPS Take 1 capsule (0.4 mg total) by mouth daily. 30 capsule 12   No current facility-administered medications for this encounter.    REVIEW OF SYSTEMS:  On review of systems, the patient reports that he is doing well overall. He denies any chest pain, shortness of breath, cough, fevers, chills, night sweats, unintended weight changes. He denies any bowel disturbances, and denies abdominal pain, nausea or vomiting. He denies any new musculoskeletal or joint aches or pains. His IPSS was 12, indicating moderate urinary symptoms. His SHIM was 1, indicating he has severe erectile dysfunction. A complete review of systems is obtained and is otherwise negative.  PHYSICAL EXAM:  Wt Readings from Last 3 Encounters:  06/24/20 131 lb 9.6 oz (59.7 kg)  06/04/20 128 lb (58.1 kg)  04/07/20 128 lb (58.1 kg)   Temp Readings from Last 3 Encounters:  06/24/20 98 F (36.7 C) (Oral)  06/04/20 98.4 F (36.9 C)  04/23/20 98 F (36.7 C) (Oral)   BP Readings from Last 3 Encounters:  06/24/20 (!) 146/89  06/04/20 (!) 165/84  04/23/20 (!) 160/88   Pulse Readings from Last 3 Encounters:  06/24/20 71  06/04/20 73  04/23/20 76   Pain Assessment Pain Score: 0-No pain/10  In general this is a well appearing Caucasian male in no acute distress. He is alert and oriented x4 and appropriate throughout the examination. HEENT reveals that the patient is normocephalic, atraumatic. EOMs are intact. PERRLA. Skin is intact without any evidence of gross lesions. Cardiovascular exam reveals a regular rate and rhythm, no clicks rubs or murmurs are auscultated. Chest is clear to auscultation bilaterally. Lymphatic assessment is performed and does not reveal any adenopathy in the cervical, supraclavicular, axillary, or inguinal chains.  Abdomen has active bowel sounds in all quadrants and is intact. The abdomen is soft, non tender, non distended. Lower extremities are negative for pretibial pitting edema, deep calf tenderness, cyanosis or clubbing.   KPS = 90  100 - Normal; no complaints; no evidence of disease. 90   - Able to carry on normal activity; minor signs or symptoms of disease. 80   -  Normal activity with effort; some signs or symptoms of disease. 77   - Cares for self; unable to carry on normal activity or to do active work. 60   - Requires occasional assistance, but is able to care for most of his personal needs. 50   - Requires considerable assistance and frequent medical care. 8   - Disabled; requires special care and assistance. 79   - Severely disabled; hospital admission is indicated although death not imminent. 71   - Very sick; hospital admission necessary; active supportive treatment necessary. 10   - Moribund; fatal processes progressing rapidly. 0     - Dead  Karnofsky DA, Abelmann [place]ain[place], Craver LS and Burchenal [facility] [phone]) The use of the nitrogen mustards in the palliative treatment of carcinoma: with particular reference to bronchogenic carcinoma Cancer 1 634-56  LABORATORY DATA:  Lab Results  Component Value Date   WBC 5.9 04/03/2020   HGB 11.9 (L) 04/03/2020   HCT 38.7 (L) 04/03/2020   MCV 92.1 04/03/2020   PLT 175 04/03/2020   Lab Results  Component Value Date   NA 139 06/05/2020   K 4.3 06/05/2020   CL 104 06/05/2020   CO2 25 06/05/2020   Lab Results  Component Value Date   ALT 14 02/22/2020   AST 17 02/22/2020   ALKPHOS 65 02/22/2020   BILITOT 0.4 02/22/2020     RADIOGRAPHY: NM Bone Scan Whole Body  Result Date: 06/13/2020 CLINICAL DATA:  Prostate carcinoma EXAM: NUCLEAR MEDICINE WHOLE BODY BONE SCAN TECHNIQUE: Whole body anterior and posterior images were obtained approximately 3 hours after intravenous injection of radiopharmaceutical. RADIOPHARMACEUTICALS:  21.8 mCi  Technetium-62m MDP IV COMPARISON:  CT chest, abdomen, and pelvis January 17, 2020 FINDINGS: There is localized increased uptake at L1. CT shows localized osteoarthritic change in this area. Areas of increased uptake overlying the right thigh likely represents urine contamination. Elsewhere, the distribution of radiotracer uptake is unremarkable. There is thoracic levoscoliosis. Kidneys are noted in the flank positions bilaterally. IMPRESSION: Probable arthropathy at L1. Apparent urine contamination overlying right thigh. No findings present felt to represent bony metastatic disease. Electronically Signed   By: Lowella Grip III M.D.   On: 06/13/2020 09:44      IMPRESSION/PLAN:   1. 86 y.o. gentleman with Stage T4a adenocarcinoma of the prostate with Gleason Score of 5+4, and PSA of 133. We discussed the patient's workup and outlined the nature of prostate cancer in this setting. The patient's T stage, Gleason's score, and PSA put him into the high risk group. Accordingly, he is eligible for a variety of potential treatment options including lifelong ADT alone or ADT in combination with either an 8 week course of external beam radiation, or prostatectomy. Given the patient's advanced age, prostatectomy is not recommended.  However, given the patient's quality of life and excellent performance status despite his age, aggressive, curative treatment is felt to be a reasonable option. We discussed the available radiation techniques, and focused on the details and logistics and delivery. We discussed and outlined the risks, benefits, short and long-term effects associated with radiotherapy and compared and contrasted these with prostatectomy. We discussed the role of SpaceOAR in reducing the rectal toxicity associated with radiotherapy. We also detailed the role of ADT in the treatment of high risk prostate cancer and outlined the associated side effects that could be expected with this therapy.  He and his  daughter were encouraged to ask questions that were answered to their stated satisfaction.  At  the conclusion of our conversation, the patient is interested in moving forward with LT-ADT concurrent with an 8-week course of daily radiotherapy.  He and his daughter appear to have a good understanding of his disease and our treatment recommendations.  He received Mills Koller ADT on 06/18/2020 and has a scheduled follow-up visit with Dr. Alyson Holt on 07/24/2020 for a 15-month Lupron injection.  They understand the intentional delay of approximately 8 weeks after starting ADT prior to starting radiotherapy to allow for the radiosensitizing effects of this therapy.  He has already started ADT, so he would be ready to start daily radiation treatments in early September 2021 but prefers to have his treatment in [place] since this is closer to his home in [place].  We are happy to make a referral to radiation oncology at [facility] to discuss treatment closer to home.  We enjoyed meeting him and his daughter today and look forward to following his progress.  Of course, we would be more than happy to continue to participate in his care should he elect to have his daily radiation treatments in [place].   Nicholos Johns, PA-C    Tyler Pita, MD  [facility] Direct Dial: [phone]  Fax: [phone] [email or web address]  Skype  [email or web address]   This document serves as a record of services personally performed by Tyler Pita, MD and Freeman Caldron, PA-C. It was created on their behalf by Wilburn Mylar, a trained medical scribe. The creation of this record is based on the scribe's personal observations and the provider's statements to them. This document has been checked and approved by the attending provider.

## 2020-06-24 NOTE — Progress Notes (Signed)
Introduced myself to patient and his daughter, as the prostate nurse navigator and discussed my role. He started Firmagon 7/7, and here to discuss radiation options.  I gave them my business card and asked them to reach out to me with questions or concerns.

## 2020-06-24 NOTE — Progress Notes (Addendum)
GU Location of Tumor / Histology: prostatic adenocarcinoma  If Prostate Cancer, Gleason Score is (4 + 5) and PSA is (133). Prostate volume: 6.7x4.2x5.2 cm.  CT of the chest abdomen and pelvis on 01/17/2020 revealed irregular wall thickening involving the medical wall of the cecum extending into the terminal ileum without evidence of metastatic disease. Asymmetrical mass involving the right lobe of the prostate gland with extension into the right seminal vesicle, with the bladder base suspicious for prostate cancer.    Past/Anticipated interventions by urology, if any: prostate biopsy, bone scan (negative), firmagon 06/18/20, referral for consideration of radiotherapy  Past/Anticipated interventions by medical oncology, if any: no  Weight changes, if any: Patient reports he is working to gain weight. Patient supplements diet with Ensure.  Bowel/Bladder complaints, if any: IPSS 12. SHIM 1. Denies dysuria or hematuria. Reports urinary leakage requiring him to change his pad twice per day. Follows up with Dr. Worthy Keeler on July 28th reference colon ca.   Nausea/Vomiting, if any: no   Pain issues, if any:  denies  SAFETY ISSUES:  Prior radiation? denies  Pacemaker/ICD? Denies but does have afib  Possible current pregnancy? no, male patient  Is the patient on methotrexate? denies  Current Complaints / other details:  86 year old male. Widowed. Five children. Retired. HX of colon ca s/p right hemicolectomy no chemo in 1999. Followed by Dr. Delton Coombes for colon ca. Patient hard of hearing and has trouble seeing. Patient accompanied by daughter, penny. Patient lives independently but daughter penny stays with him during the day and cooks his meals.

## 2020-06-25 ENCOUNTER — Other Ambulatory Visit: Payer: Self-pay | Admitting: Urology

## 2020-06-25 DIAGNOSIS — C61 Malignant neoplasm of prostate: Secondary | ICD-10-CM

## 2020-06-26 ENCOUNTER — Telehealth: Payer: Self-pay | Admitting: *Deleted

## 2020-06-26 ENCOUNTER — Encounter: Payer: Self-pay | Admitting: Medical Oncology

## 2020-06-26 NOTE — Telephone Encounter (Signed)
Patient to have a consult with Dr. John Giovanni in [place] on 07/07/20 @ 12:30 pm, Megan @ [facility] to contact patient

## 2020-06-30 ENCOUNTER — Encounter: Payer: Self-pay | Admitting: Urology

## 2020-06-30 NOTE — Patient Instructions (Signed)
Prostate Cancer  The prostate is a male gland that helps make semen. Prostate cancer is when abnormal cells grow in this gland. Follow these instructions at home:  Take over-the-counter and prescription medicines only as told by your doctor.  Eat a healthy diet.  Get plenty of sleep.  Ask your doctor for help to find a support group for men with prostate cancer.  Keep all follow-up visits as told by your doctor. This is important.  If you have to go to the hospital, let your cancer doctor (oncologist) know.  Touch, hold, hug, and caress your partner to continue to show sexual feelings. Contact a doctor if:  You have trouble peeing (urinating).  You have blood in your pee (urine).  You have pain in your hips, back, or chest. Get help right away if:  You have weakness in your legs.  You lose feeling (have numbness) in your legs.  You cannot control your pee or your poop (stool).  You have trouble breathing.  You have sudden pain in your chest.  You have chills or a fever. Summary  The prostate is a male gland that helps make semen. Prostate cancer is when abnormal cells grow in this gland.  Ask your doctor for help to find a support group for men with prostate cancer.  Contact a doctor if you have problems peeing or have any new pain that you did not have before. This information is not intended to replace advice given to you by your health care provider. Make sure you discuss any questions you have with your health care provider. Document Revised: 11/11/2017 Document Reviewed: 08/09/2016 Elsevier Patient Education  2020 Elsevier Inc.  

## 2020-07-02 ENCOUNTER — Other Ambulatory Visit: Payer: Self-pay

## 2020-07-02 ENCOUNTER — Inpatient Hospital Stay (HOSPITAL_COMMUNITY): Payer: Medicare HMO | Attending: Hematology

## 2020-07-02 DIAGNOSIS — D509 Iron deficiency anemia, unspecified: Secondary | ICD-10-CM | POA: Diagnosis not present

## 2020-07-02 DIAGNOSIS — R630 Anorexia: Secondary | ICD-10-CM | POA: Diagnosis not present

## 2020-07-02 DIAGNOSIS — N189 Chronic kidney disease, unspecified: Secondary | ICD-10-CM | POA: Insufficient documentation

## 2020-07-02 DIAGNOSIS — C180 Malignant neoplasm of cecum: Secondary | ICD-10-CM

## 2020-07-02 DIAGNOSIS — Z8249 Family history of ischemic heart disease and other diseases of the circulatory system: Secondary | ICD-10-CM | POA: Insufficient documentation

## 2020-07-02 DIAGNOSIS — Z9049 Acquired absence of other specified parts of digestive tract: Secondary | ICD-10-CM | POA: Diagnosis not present

## 2020-07-02 DIAGNOSIS — N281 Cyst of kidney, acquired: Secondary | ICD-10-CM | POA: Diagnosis not present

## 2020-07-02 DIAGNOSIS — I4891 Unspecified atrial fibrillation: Secondary | ICD-10-CM | POA: Diagnosis not present

## 2020-07-02 DIAGNOSIS — R5383 Other fatigue: Secondary | ICD-10-CM | POA: Diagnosis not present

## 2020-07-02 DIAGNOSIS — Z79899 Other long term (current) drug therapy: Secondary | ICD-10-CM | POA: Diagnosis not present

## 2020-07-02 DIAGNOSIS — C61 Malignant neoplasm of prostate: Secondary | ICD-10-CM | POA: Diagnosis not present

## 2020-07-02 DIAGNOSIS — Z833 Family history of diabetes mellitus: Secondary | ICD-10-CM | POA: Insufficient documentation

## 2020-07-02 DIAGNOSIS — Z87891 Personal history of nicotine dependence: Secondary | ICD-10-CM | POA: Insufficient documentation

## 2020-07-02 LAB — COMPREHENSIVE METABOLIC PANEL
ALT: 15 U/L (ref 0–44)
AST: 18 U/L (ref 15–41)
Albumin: 4.5 g/dL (ref 3.5–5.0)
Alkaline Phosphatase: 60 U/L (ref 38–126)
Anion gap: 12 (ref 5–15)
BUN: 27 mg/dL — ABNORMAL HIGH (ref 8–23)
CO2: 24 mmol/L (ref 22–32)
Calcium: 9.7 mg/dL (ref 8.9–10.3)
Chloride: 104 mmol/L (ref 98–111)
Creatinine, Ser: 1.26 mg/dL — ABNORMAL HIGH (ref 0.61–1.24)
GFR calc Af Amer: 57 mL/min — ABNORMAL LOW (ref >60)
GFR calc non Af Amer: 50 mL/min — ABNORMAL LOW (ref >60)
Glucose, Bld: 98 mg/dL (ref 70–99)
Potassium: 4.6 mmol/L (ref 3.5–5.1)
Sodium: 140 mmol/L (ref 135–145)
Total Bilirubin: 0.6 mg/dL (ref 0.3–1.2)
Total Protein: 7.7 g/dL (ref 6.5–8.1)

## 2020-07-02 LAB — CBC WITH DIFFERENTIAL/PLATELET
Abs Immature Granulocytes: 0.03 10*3/uL (ref 0.00–0.07)
Basophils Absolute: 0 10*3/uL (ref 0.0–0.1)
Basophils Relative: 1 %
Eosinophils Absolute: 0.1 10*3/uL (ref 0.0–0.5)
Eosinophils Relative: 1 %
HCT: 43.1 % (ref 39.0–52.0)
Hemoglobin: 13.7 g/dL (ref 13.0–17.0)
Immature Granulocytes: 0 %
Lymphocytes Relative: 10 %
Lymphs Abs: 0.8 10*3/uL (ref 0.7–4.0)
MCH: 31.1 pg (ref 26.0–34.0)
MCHC: 31.8 g/dL (ref 30.0–36.0)
MCV: 97.7 fL (ref 80.0–100.0)
Monocytes Absolute: 0.6 10*3/uL (ref 0.1–1.0)
Monocytes Relative: 7 %
Neutro Abs: 6.6 10*3/uL (ref 1.7–7.7)
Neutrophils Relative %: 81 %
Platelets: 159 10*3/uL (ref 150–400)
RBC: 4.41 MIL/uL (ref 4.22–5.81)
RDW: 12.5 % (ref 11.5–15.5)
WBC: 8.2 10*3/uL (ref 4.0–10.5)
nRBC: 0 % (ref 0.0–0.2)

## 2020-07-02 LAB — IRON AND TIBC
Iron: 58 ug/dL (ref 45–182)
Saturation Ratios: 22 % (ref 17.9–39.5)
TIBC: 269 ug/dL (ref 250–450)
UIBC: 211 ug/dL

## 2020-07-02 LAB — FERRITIN: Ferritin: 304 ng/mL (ref 24–336)

## 2020-07-03 LAB — CEA: CEA: 3.3 ng/mL (ref 0.0–4.7)

## 2020-07-03 LAB — PSA: Prostatic Specific Antigen: 28.99 ng/mL — ABNORMAL HIGH (ref 0.00–4.00)

## 2020-07-04 DIAGNOSIS — C61 Malignant neoplasm of prostate: Secondary | ICD-10-CM | POA: Diagnosis not present

## 2020-07-04 DIAGNOSIS — M545 Low back pain: Secondary | ICD-10-CM | POA: Diagnosis not present

## 2020-07-04 DIAGNOSIS — Z79891 Long term (current) use of opiate analgesic: Secondary | ICD-10-CM | POA: Diagnosis not present

## 2020-07-04 DIAGNOSIS — I10 Essential (primary) hypertension: Secondary | ICD-10-CM | POA: Diagnosis not present

## 2020-07-07 DIAGNOSIS — Z9889 Other specified postprocedural states: Secondary | ICD-10-CM | POA: Diagnosis not present

## 2020-07-07 DIAGNOSIS — C61 Malignant neoplasm of prostate: Secondary | ICD-10-CM | POA: Diagnosis not present

## 2020-07-07 DIAGNOSIS — Z85038 Personal history of other malignant neoplasm of large intestine: Secondary | ICD-10-CM | POA: Diagnosis not present

## 2020-07-07 DIAGNOSIS — K219 Gastro-esophageal reflux disease without esophagitis: Secondary | ICD-10-CM | POA: Diagnosis not present

## 2020-07-09 ENCOUNTER — Inpatient Hospital Stay (HOSPITAL_COMMUNITY): Payer: Medicare HMO | Admitting: Hematology

## 2020-07-09 ENCOUNTER — Other Ambulatory Visit: Payer: Self-pay

## 2020-07-09 VITALS — BP 148/81 | HR 76 | Temp 98.2°F | Resp 18 | Wt 131.6 lb

## 2020-07-09 DIAGNOSIS — Z79899 Other long term (current) drug therapy: Secondary | ICD-10-CM | POA: Diagnosis not present

## 2020-07-09 DIAGNOSIS — I4891 Unspecified atrial fibrillation: Secondary | ICD-10-CM | POA: Diagnosis not present

## 2020-07-09 DIAGNOSIS — R630 Anorexia: Secondary | ICD-10-CM | POA: Diagnosis not present

## 2020-07-09 DIAGNOSIS — R5383 Other fatigue: Secondary | ICD-10-CM | POA: Diagnosis not present

## 2020-07-09 DIAGNOSIS — N189 Chronic kidney disease, unspecified: Secondary | ICD-10-CM | POA: Diagnosis not present

## 2020-07-09 DIAGNOSIS — C180 Malignant neoplasm of cecum: Secondary | ICD-10-CM | POA: Diagnosis not present

## 2020-07-09 DIAGNOSIS — D509 Iron deficiency anemia, unspecified: Secondary | ICD-10-CM | POA: Diagnosis not present

## 2020-07-09 DIAGNOSIS — N281 Cyst of kidney, acquired: Secondary | ICD-10-CM | POA: Diagnosis not present

## 2020-07-09 DIAGNOSIS — C61 Malignant neoplasm of prostate: Secondary | ICD-10-CM | POA: Diagnosis not present

## 2020-07-09 NOTE — Progress Notes (Signed)
Tony Holt, [place] [address]   CLINIC:  Medical Oncology/Hematology  PCP:  [name], MD [place] / [place] [address] [phone]   REASON FOR VISIT:  Follow-up for colon cancer and iron deficiency anemia  PRIOR THERAPY: Surgical resection  CURRENT THERAPY: Observation and intermittent Feraheme  BRIEF ONCOLOGIC HISTORY:  Oncology History  Cecal cancer ([place])  02/04/2020 Initial Diagnosis   Cecal cancer ([place])   03/03/2020 Cancer Staging   Staging form: Colon and Rectum, AJCC 8th Edition - Clinical stage from 03/03/2020: Stage IIB (cT4a, cN0, cM0) - Signed by Derek Jack, MD on 03/03/2020     CANCER STAGING: Cancer Staging Cecal cancer [facility]) Staging form: Colon and Rectum, AJCC 8th Edition - Clinical stage from 03/03/2020: Stage IIB (cT4a, cN0, cM0) - Signed by Derek Jack, MD on 03/03/2020   INTERVAL HISTORY:  [name], a 86 y.o. male, returns for routine follow-up of his colon cancer and iron deficiency anemia. Tony Holt was last seen on 04/03/2020.  Prostate Biopsy on 04/23/2020 revealed 6/12 positive biopsies. The maximum Gleason score was 5+4 (Grade Group 5) located in the right base and right mid prostate. Additionally, Gleason 4+5 disease was noted in the right base lateral, right mid lateral, right apex, and right apex lateral.   A bone scan was performed on 06/12/2020 revealing no evidence of osseous metastatic disease.   He was started on Firmagon with Dr. Alyson Ingles.  He fell recently while feeding the birds. He notes that he has some soreness, but that he didn't go to the doctor.    REVIEW OF SYSTEMS:  Review of Systems  Constitutional: Positive for appetite change (mildly decreased) and fatigue (mild).  HENT:  Negative.   Eyes: Negative.   Respiratory: Negative.   Cardiovascular: Negative.   Gastrointestinal: Negative.   Endocrine: Negative.   Genitourinary: Negative.      Musculoskeletal: Negative.   Skin: Negative.   Neurological: Negative.        Recent fall  Hematological: Negative.   Psychiatric/Behavioral: Negative.   All other systems reviewed and are negative.   PAST MEDICAL/SURGICAL HISTORY:  Past Medical History:  Diagnosis Date  . BPH (benign prostatic hyperplasia)   . Colon cancer ([place]) 1999  . Dysrhythmia    new onset A Fib  . Essential hypertension   . GERD (gastroesophageal reflux disease)   . Hard of hearing   . Prostate cancer [facility])    Past Surgical History:  Procedure Laterality Date  . BIOPSY  12/28/2019   Procedure: BIOPSY;  Surgeon: Daneil Dolin, MD;  Location: [facility] ENDO SUITE;  Service: Endoscopy;;  gastric, esophagus,cecal  . CATARACT EXTRACTION W/PHACO  01/18/2013   Procedure: CATARACT EXTRACTION PHACO AND INTRAOCULAR LENS PLACEMENT ([place]);  Surgeon: Tonny Branch, MD;  Location: [facility] ORS;  Service: Ophthalmology;  Laterality: Left;  CDE 17.11  . CATARACT EXTRACTION W/PHACO Right 02/05/2013   Procedure: CATARACT EXTRACTION PHACO AND INTRAOCULAR LENS PLACEMENT (IOC);  Surgeon: Tonny Branch, MD;  Location: [facility] ORS;  Service: Ophthalmology;  Laterality: Right;  CDE:19.49  . COLON SURGERY  1999   Sigmoid colon cancer, segmental resection by Drs. Smith/Bradford  . COLONOSCOPY  2002   Multiple adenomatous colon polyps  . COLONOSCOPY N/A 12/28/2019   Procedure: COLONOSCOPY;  Surgeon: Daneil Dolin, MD;  Location: [facility] ENDO SUITE;  Service: Endoscopy;  Laterality: N/A;  . ESOPHAGOGASTRODUODENOSCOPY  03/25/2012   Dr. Gala Romney: Severe ulcerative/erosive reflux esophagitis.  Small hiatal hernia.  Abnormal gastric and duodenal mucosa, biopsy showed reactive changes and minimal chronic inflammation but no H. pylori.  . ESOPHAGOGASTRODUODENOSCOPY N/A 12/28/2019   Procedure: ESOPHAGOGASTRODUODENOSCOPY (EGD);  Surgeon: Daneil Dolin, MD;  Location: [facility] ENDO SUITE;  Service: Endoscopy;  Laterality: N/A;  . HERNIA REPAIR  02/2011   Right inguinal hernia  repair with mesh, Dr. Geroge Baseman  . HERNIA REPAIR     three total  . PARTIAL COLECTOMY Right 02/04/2020   Procedure: RIGHT HEMI-COLECTOMY;  Surgeon: Virl Cagey, MD;  Location: [facility] ORS;  Service: General;  Laterality: Right;  . POLYPECTOMY  12/28/2019   Procedure: POLYPECTOMY;  Surgeon: Daneil Dolin, MD;  Location: [facility] ENDO SUITE;  Service: Endoscopy;;  rectal     SOCIAL HISTORY:  Social History   Socioeconomic History  . Marital status: Widowed    Spouse name: Not on file  . Number of children: 5  . Years of education: Not on file  . Highest education level: Not on file  Occupational History  . Occupation: Retired Neurosurgeon: RETIRED  Tobacco Use  . Smoking status: Former Smoker    Packs/day: 1.00    Years: 50.00    Pack years: 50.00    Types: Cigarettes    Quit date: 12/13/1989    Years since quitting: 30.5  . Smokeless tobacco: Current User    Types: Snuff  Vaping Use  . Vaping Use: Never used  Substance and Sexual Activity  . Alcohol use: No    Alcohol/week: 0.0 standard drinks    Comment: previous heavy drinker at age [age]  . Drug use: No  . Sexual activity: Not Currently  Other Topics Concern  . Not on file  Social History Narrative  . Not on file   Social Determinants of Health   Financial Resource Strain: Low Risk   . Difficulty of Paying Living Expenses: Not hard at all  Food Insecurity: No Food Insecurity  . Worried About Charity fundraiser in the Last Year: Never true  . Ran Out of Food in the Last Year: Never true  Transportation Needs: No Transportation Needs  . Lack of Transportation (Medical): No  . Lack of Transportation (Non-Medical): No  Physical Activity: Insufficiently Active  . Days of Exercise per Week: 3 days  . Minutes of Exercise per Session: 20 min  Stress: No Stress Concern Present  . Feeling of Stress : Only a little  Social Connections: Socially Isolated  . Frequency of Communication with Friends and Family:  More than three times a week  . Frequency of Social Gatherings with Friends and Family: More than three times a week  . Attends Religious Services: Never  . Active Member of Clubs or Organizations: No  . Attends Archivist Meetings: Never  . Marital Status: Widowed  Intimate Partner Violence: Not At Risk  . Fear of Current or Ex-Partner: No  . Emotionally Abused: No  . Physically Abused: No  . Sexually Abused: No    FAMILY HISTORY:  Family History  Problem Relation Age of Onset  . Heart attack Father        Deceased, age 36s  . Diabetes Brother   . Colon cancer Neg Hx   . Liver disease Neg Hx   . Breast cancer Neg Hx   . Prostate cancer Neg Hx   . Pancreatic cancer Neg Hx     CURRENT MEDICATIONS:  Current Outpatient Medications  Medication Sig Dispense Refill  . diphenhydramine-acetaminophen (  TYLENOL PM) 25-500 MG TABS tablet Take 2 tablets by mouth at bedtime.     . Ensure (ENSURE) Take 237 mLs by mouth 3 (three) times daily between meals.    Marland Kitchen omeprazole (PRILOSEC) 20 MG capsule Take 20 mg by mouth 2 (two) times daily.  11  . Pediatric Multiple Vitamins (FLINTSTONES MULTIVITAMIN PO) Take 1 tablet by mouth daily.     . Probiotic Product (CULTURELLE PROBIOTICS PO) Take by mouth daily.    . Tamsulosin HCl (FLOMAX) 0.4 MG CAPS Take 1 capsule (0.4 mg total) by mouth daily. 30 capsule 12  . docusate sodium (COLACE) 100 MG capsule Take 1 capsule (100 mg total) by mouth 2 (two) times daily as needed for mild constipation. (Patient not taking: Reported on 07/09/2020) 60 capsule 1  . HYDROcodone-acetaminophen (NORCO/VICODIN) 5-325 MG tablet Take 1 tablet by mouth every 12 (twelve) hours as needed for moderate pain.  (Patient not taking: Reported on 07/09/2020)  0   No current facility-administered medications for this visit.    ALLERGIES:  No Known Allergies  PHYSICAL EXAM:  Performance status (ECOG): 2 - Symptomatic, <50% confined to bed  Vitals:   07/09/20 1518  BP:  (!) 148/81  Pulse: 76  Resp: 18  Temp: 98.2 F (36.8 C)  SpO2: 96%   Wt Readings from Last 3 Encounters:  07/09/20 131 lb 9.6 oz (59.7 kg)  06/24/20 131 lb 9.6 oz (59.7 kg)  06/04/20 128 lb (58.1 kg)   Physical Exam Vitals reviewed.  Constitutional:      Appearance: Normal appearance.  HENT:     Mouth/Throat:     Mouth: Mucous membranes are moist.  Eyes:     Pupils: Pupils are equal, round, and reactive to light.  Cardiovascular:     Rate and Rhythm: Normal rate and regular rhythm.     Pulses: Normal pulses.     Heart sounds: Normal heart sounds.  Pulmonary:     Effort: Pulmonary effort is normal.     Breath sounds: Normal breath sounds.  Abdominal:     Palpations: Abdomen is soft. There is no mass.     Tenderness: There is no abdominal tenderness.  Musculoskeletal:     Right lower leg: No edema.     Left lower leg: No edema.  Neurological:     Mental Status: He is alert and oriented to person, place, and time.  Psychiatric:        Mood and Affect: Mood normal.        Behavior: Behavior normal.      LABORATORY DATA:  I have reviewed the labs as listed.  CBC Latest Ref Rng & Units 07/02/2020 04/03/2020 03/03/2020  WBC 4.0 - 10.5 K/uL 8.2 5.9 6.8  Hemoglobin 13.0 - 17.0 g/dL 13.7 11.9(L) 10.1(L)  Hematocrit 39 - 52 % 43.1 38.7(L) 33.9(L)  Platelets 150 - 400 K/uL 159 175 278   CMP Latest Ref Rng & Units 07/02/2020 06/05/2020 03/03/2020  Glucose 70 - 99 mg/dL 98 78 105(H)  BUN 8 - 23 mg/dL 27(H) 23 21  Creatinine 0.61 - 1.24 mg/dL 1.26(H) 1.31(H) 1.36(H)  Sodium 135 - 145 mmol/L 140 139 138  Potassium 3.5 - 5.1 mmol/L 4.6 4.3 4.1  Chloride 98 - 111 mmol/L 104 104 107  CO2 22 - 32 mmol/L _0 Calcium 8.9 - 10.3 mg/dL 9.7 9.6 9.0  Total Protein 6.5 - 8.1 g/dL 7.7 - -  Total Bilirubin 0.3 - 1.2 mg/dL 0.6 - -  Alkaline Phos 38 - 126 U/L 60 - -  AST 15 - 41 U/L 18 - -  ALT 0 - 44 U/L 15 - -    DIAGNOSTIC IMAGING:  I have independently reviewed the scans and  discussed with the patient. NM Bone Scan Whole Body  Result Date: 06/13/2020 CLINICAL DATA:  Prostate carcinoma EXAM: NUCLEAR MEDICINE WHOLE BODY BONE SCAN TECHNIQUE: Whole body anterior and posterior images were obtained approximately 3 hours after intravenous injection of radiopharmaceutical. RADIOPHARMACEUTICALS:  21.8 mCi Technetium-87mMDP IV COMPARISON:  CT chest, abdomen, and pelvis January 17, 2020 FINDINGS: There is localized increased uptake at L1. CT shows localized osteoarthritic change in this area. Areas of increased uptake overlying the right thigh likely represents urine contamination. Elsewhere, the distribution of radiotracer uptake is unremarkable. There is thoracic levoscoliosis. Kidneys are noted in the flank positions bilaterally. IMPRESSION: Probable arthropathy at L1. Apparent urine contamination overlying right thigh. No findings present felt to represent bony metastatic disease. Electronically Signed   By: WLowella GripIII M.D.   On: 06/13/2020 09:44     ASSESSMENT:  Cecal cancer (HCC) 1.  Stage IIb (PT4APN0) cecal cancer: -Right hemicolectomy on 02/04/2020.  PT4APN0, MMR preserved.  0/19 lymph nodes involved.  Margins negative.  Grade 2. -He also had colon cancer in 1999 and underwent surgery without any need for chemotherapy. -We reviewed his labs.  CEA is within normal limits. -CT CAP on 01/17/2020 showed irregular wall thickening involving the medial wall of the cecum extending into the terminal ileum.  No evidence of metastatic disease.  Asymmetric mass involving the right lobe of the prostate gland with extension into the right seminal vesicle and bladder base suspicious for prostate cancer.  Stable biliary dilation and bilateral renal cysts.  Age-indeterminate T12 compression deformity. -We will plan to see him back with repeat CEA level in 3 months.  2.  Iron deficiency state: -He has normocytic anemia, combination from CKD and relative iron deficiency. -Feraheme  on 03/12/2020.  3.  T4a adenocarcinoma the prostate: -Gleason score of 5+4, PSA of 133.  PLAN:  1.  Stage IIb (PT4APN0) cecal cancer: -I reviewed his labs.  CEA is 3.3.  LFTs are normal. -I have recommended follow-up in 3 months with a CT of the abdomen and pelvis and CEA level.  2.  T4a adenocarcinoma of the prostate: -He is currently receiving ADT.  PSA today is down to 28.9. -[place]  3.  Normocytic anemia: -Hemoglobin today is 13.7.  Ferritin is 304 and percent saturation is 22.  No parenteral iron therapy needed.  4.  CKD: -Creatinine stable around 1.26.    Orders placed this encounter:  No orders of the defined types were placed in this encounter.    SDerek Jack MD [phone]  I, AJacqualyn Posey am acting as a scribe for Dr. SSanda Linger  I, SDerek JackMD, have reviewed the above documentation for accuracy and completeness, and I agree with the above.

## 2020-07-09 NOTE — Patient Instructions (Signed)
[facility] at [facility] Discharge Instructions  You were seen today by Dr. Katragadda. He went over your recent results. Dr. Katragadda will see you back in for labs and follow up.   Thank you for choosing [facility] at [facility] to provide your oncology and hematology care.  To afford each patient quality time with our provider, please arrive at least 15 minutes before your scheduled appointment time.   If you have a lab appointment with the Cancer Center please come in thru the Main Entrance and check in at the main information desk  You need to re-schedule your appointment should you arrive 10 or more minutes late.  We strive to give you quality time with our providers, and arriving late affects you and other patients whose appointments are after yours.  Also, if you no show three or more times for appointments you may be dismissed from the clinic at the providers discretion.     Again, thank you for choosing [facility].  Our hope is that these requests will decrease the amount of time that you wait before being seen by our physicians.       _____________________________________________________________  Should you have questions after your visit to [facility], please contact our office at [phone] between the hours of 8:00 a.m. and 4:30 p.m.  Voicemails left after 4:00 p.m. will not be returned until the following business day.  For prescription refill requests, have your pharmacy contact our office and allow 72 hours.    Cancer Center Support Programs:   > Cancer Support Group  2nd Tuesday of the month 1pm-2pm, Journey Room    

## 2020-07-24 ENCOUNTER — Ambulatory Visit (INDEPENDENT_AMBULATORY_CARE_PROVIDER_SITE_OTHER): Payer: Medicare HMO | Admitting: Urology

## 2020-07-24 ENCOUNTER — Other Ambulatory Visit: Payer: Self-pay | Admitting: Urology

## 2020-07-24 ENCOUNTER — Other Ambulatory Visit: Payer: Self-pay

## 2020-07-24 VITALS — BP 122/74 | HR 98 | Temp 97.5°F

## 2020-07-24 DIAGNOSIS — C61 Malignant neoplasm of prostate: Secondary | ICD-10-CM

## 2020-07-24 MED ORDER — LEVOFLOXACIN 750 MG PO TABS: 750.0000 mg | ORAL_TABLET | Freq: Once | ORAL | 0 refills | Status: AC

## 2020-07-24 MED ORDER — LEUPROLIDE ACETATE (6 MONTH) 45 MG IM KIT
45.0000 mg | PACK | Freq: Once | INTRAMUSCULAR | Status: AC
  Administered 2020-07-24: 45 mg via INTRAMUSCULAR

## 2020-07-24 NOTE — Progress Notes (Signed)

## 2020-07-24 NOTE — Progress Notes (Signed)
07/24/2020 10:34 AM   Alanda Slim 21-Apr-1926 [id number]  Referring provider: [name], MD [address] [place],  [place] [address]  followup prostate cancer  HPI: Mr Boardley is a 86yo with high risk prostate cancer here for followup. He has not been scheduled for fiducial markers. He is due for ADT. NO bone pain. Mild fatigue   PMH: Past Medical History:  Diagnosis Date  . BPH (benign prostatic hyperplasia)   . Colon cancer ([place]) 1999  . Dysrhythmia    new onset A Fib  . Essential hypertension   . GERD (gastroesophageal reflux disease)   . Hard of hearing   . Prostate cancer [facility])     Surgical History: Past Surgical History:  Procedure Laterality Date  . BIOPSY  12/28/2019   Procedure: BIOPSY;  Surgeon: Daneil Dolin, MD;  Location: [facility];  Service: Endoscopy;;  gastric, esophagus,cecal  . CATARACT EXTRACTION W/PHACO  01/18/2013   Procedure: CATARACT EXTRACTION PHACO AND INTRAOCULAR LENS PLACEMENT ([place]);  Surgeon: Tonny Branch, MD;  Location: [facility] ORS;  Service: Ophthalmology;  Laterality: Left;  CDE 17.11  . CATARACT EXTRACTION W/PHACO Right 02/05/2013   Procedure: CATARACT EXTRACTION PHACO AND INTRAOCULAR LENS PLACEMENT ([facility]);  Surgeon: Tonny Branch, MD;  Location: [facility] ORS;  Service: Ophthalmology;  Laterality: Right;  CDE:19.49  . COLON SURGERY  1999   Sigmoid colon cancer, segmental resection by Drs. Smith/Bradford  . COLONOSCOPY  2002   Multiple adenomatous colon polyps  . COLONOSCOPY N/A 12/28/2019   Procedure: COLONOSCOPY;  Surgeon: Daneil Dolin, MD;  Location: [facility] ENDO SUITE;  Service: Endoscopy;  Laterality: N/A;  . ESOPHAGOGASTRODUODENOSCOPY  03/25/2012   Dr. Gala Romney: Severe ulcerative/erosive reflux esophagitis.  Small hiatal hernia.  Abnormal gastric and duodenal mucosa, biopsy showed reactive changes and minimal chronic inflammation but no H. pylori.  . ESOPHAGOGASTRODUODENOSCOPY N/A 12/28/2019   Procedure: ESOPHAGOGASTRODUODENOSCOPY (EGD);   Surgeon: Daneil Dolin, MD;  Location: [facility] ENDO SUITE;  Service: Endoscopy;  Laterality: N/A;  . HERNIA REPAIR  02/2011   Right inguinal hernia repair with mesh, Dr. Geroge Baseman  . HERNIA REPAIR     three total  . PARTIAL COLECTOMY Right 02/04/2020   Procedure: RIGHT HEMI-COLECTOMY;  Surgeon: Virl Cagey, MD;  Location: [facility] ORS;  Service: General;  Laterality: Right;  . POLYPECTOMY  12/28/2019   Procedure: POLYPECTOMY;  Surgeon: Daneil Dolin, MD;  Location: [facility]O [facility];  Service: Endoscopy;;  rectal     Home Medications:  Allergies as of 07/24/2020   No Known Allergies     Medication List       Accurate as of July 24, 2020 10:34 AM. If you have any questions, ask your nurse or doctor.        CULTURELLE PROBIOTICS PO Take by mouth daily.   diphenhydramine-acetaminophen 25-500 MG Tabs tablet Commonly known as: TYLENOL PM Take 2 tablets by mouth at bedtime.   docusate sodium 100 MG capsule Commonly known as: COLACE Take 1 capsule (100 mg total) by mouth 2 (two) times daily as needed for mild constipation.   Ensure Take 237 mLs by mouth 3 (three) times daily between meals.   FLINTSTONES MULTIVITAMIN PO Take 1 tablet by mouth daily.   HYDROcodone-acetaminophen 5-325 MG tablet Commonly known as: NORCO/VICODIN Take 1 tablet by mouth every 12 (twelve) hours as needed for moderate pain.   omeprazole 20 MG capsule Commonly known as: PRILOSEC Take 20 mg by mouth 2 (two) times daily.   tamsulosin 0.4  MG Caps capsule Commonly known as: FLOMAX Take 1 capsule (0.4 mg total) by mouth daily.       Allergies: No Known Allergies  Family History: Family History  Problem Relation Age of Onset  . Heart attack Father        Deceased, age 31s  . Diabetes Brother   . Colon cancer Neg Hx   . Liver disease Neg Hx   . Breast cancer Neg Hx   . Prostate cancer Neg Hx   . Pancreatic cancer Neg Hx     Social History:  reports that he quit smoking about 30 years ago. His  smoking use included cigarettes. He has a 50.00 pack-year smoking history. His smokeless tobacco use includes snuff. He reports that he does not drink alcohol and does not use drugs.  ROS: All other review of systems were reviewed and are negative except what is noted above in HPI  Physical Exam: BP 122/74   Pulse 98   Temp (!) 97.5 F (36.4 C)   Constitutional:  Alert and oriented, No acute distress. HEENT: [place] AT, moist mucus membranes.  Trachea midline, no masses. Cardiovascular: No clubbing, cyanosis, or edema. Respiratory: Normal respiratory effort, no increased work of breathing. GI: Abdomen is soft, nontender, nondistended, no abdominal masses GU: No CVA tenderness.  Lymph: No cervical or inguinal lymphadenopathy. Skin: No rashes, bruises or suspicious lesions. Neurologic: Grossly intact, no focal deficits, moving all 4 extremities. Psychiatric: Normal mood and affect.  Laboratory Data: Lab Results  Component Value Date   WBC 8.2 07/02/2020   HGB 13.7 07/02/2020   HCT 43.1 07/02/2020   MCV 97.7 07/02/2020   PLT 159 07/02/2020    Lab Results  Component Value Date   CREATININE 1.26 (H) 07/02/2020    No results found for: PSA  No results found for: TESTOSTERONE  Lab Results  Component Value Date   HGBA1C (H) 02/08/2011    5.7 (NOTE)                                                                       According to the ADA Clinical Practice Recommendations for 2011, when HbA1c is used as a screening test:   >=6.5%   Diagnostic of Diabetes Mellitus           (if abnormal result  is confirmed)  5.7-6.4%   Increased risk of developing Diabetes Mellitus  References:Diagnosis and Classification of Diabetes Mellitus,Diabetes ZOXW,9604,54(UJWJX 1):S62-S69 and Standards of Medical Care in         Diabetes - 2011,Diabetes Care,2011,34  (Suppl 1):S11-S61.    Urinalysis    Component Value Date/Time   COLORURINE STRAW (A) 02/22/2020 1929   APPEARANCEUR CLEAR 02/22/2020 1929    LABSPEC 1.008 02/22/2020 1929   PHURINE 6.0 02/22/2020 [place] NEGATIVE 02/22/2020 [place] NEGATIVE 02/22/2020 1929   BILIRUBINUR neg 04/07/2020 [place] 02/22/2020 1929   PROTEINUR Positive (A) 04/07/2020 1426   PROTEINUR NEGATIVE 02/22/2020 1929   UROBILINOGEN negative (A) 04/07/2020 1426   UROBILINOGEN 0.2 03/23/2012 2041   NITRITE neg 04/07/2020 1426   NITRITE NEGATIVE 02/22/2020 1929   LEUKOCYTESUR Negative 04/07/2020 1426   LEUKOCYTESUR NEGATIVE 02/22/2020 1929    Lab Results  Component Value Date   BACTERIA NONE SEEN 02/19/2020    Pertinent Imaging:  Results for orders placed during the hospital encounter of 02/07/11  DG Abd 1 View  Narrative *RADIOLOGY REPORT*  Clinical Data: Abdominal pain.  ABDOMEN - 1 VIEW  Comparison: CT 02/07/2011.  Findings: Contrast from previous CT has progressed into the colon. There is contrast from the previous CT and urinary bladder.  There is no evidence of bowel obstruction.  No opaque calculi are seen. There is slight scoliosis convexity to the left.  IMPRESSION: No acute or active abdominal process is identified.  Original Report Authenticated By: Delane Ginger, M.D.  No results found for this or any previous visit.  No results found for this or any previous visit.  No results found for this or any previous visit.  Results for orders placed during the hospital encounter of 02/22/20  US RENAL  Narrative CLINICAL DATA:  Acute kidney injury.  EXAM: RENAL / URINARY TRACT ULTRASOUND COMPLETE  COMPARISON:  01/17/2020 CT abdomen/pelvis.  FINDINGS: Right Kidney:  Renal measurements: 11.2 x 4.8 x 6.0 cm = volume: 169 mL. Normal renal parenchymal echogenicity and thickness. No hydronephrosis. Multiple simple appearing right renal cysts, largest 7.6 x 5.0 x 5.9 cm in the upper right kidney.  Left Kidney:  Renal measurements: 10.8 x 6.1 x 5.3 cm = volume: 182 mL. Normal renal  parenchymal echogenicity and thickness. No hydronephrosis. Multiple simple appearing left renal cysts, largest 4.2 x 3.3 x 4.7 cm in the lateral lower left kidney.  Bladder:  Appears normal for degree of bladder distention. Ureteral jets demonstrated bilaterally in the bladder.  Other:  Enlarged prostate measuring approximately 6.7 x 4.2 x 5.2 cm.  IMPRESSION: 1. No hydronephrosis. 2. Simple bilateral renal cysts. 3. Prostatomegaly. Please see recent 01/17/2020 CT report for further details regarding the prostate.   Electronically Signed By: Ilona Sorrel M.D. On: 02/22/2020 17:31  No results found for this or any previous visit.  No results found for this or any previous visit.  No results found for this or any previous visit.   Assessment & Plan:    1. Prostate cancer ([place]) -schedule for fiducial markers - Urinalysis, Routine w reflex microscopic - Leuprolide Acetate (6 Month) (LUPRON) injection 45 mg   No follow-ups on file.  Nicolette Bang, MD  [facility] [place]

## 2020-07-24 NOTE — Patient Instructions (Addendum)
   Appointment Time:12:15  Appointment Date:08/08/2020  Location: [name]   [place] placement  Stop all aspirin or blood thinners (aspirin, plavix, coumadin, warfarin, motrin, ibuprofen, advil, aleve, naproxen, naprosyn) for 7 days prior to the procedure.  If you have any questions about stopping these medications, please contact your primary care physician or cardiologist.  Having a light meal prior to the procedure is recommended.  If you are diabetic or have low blood sugar please bring a small snack or glucose tablet.  A Fleets enema is needed to be purchased over the counter at a local pharmacy and used 2 hours before you scheduled appointment.  This can be purchased over the counter at any pharmacy.  Antibiotics will be administered in the clinic at the time of the procedure and 1 tablet has been sent to your pharmacy. Please take the antibiotic as prescribed.    Please bring someone with you to the procedure to drive you home.   If you have any questions or concerns, please feel free to call the office at [phone] or send a Mychart message.    Thank you, [facility]

## 2020-07-28 DIAGNOSIS — D400 Neoplasm of uncertain behavior of prostate: Secondary | ICD-10-CM | POA: Diagnosis not present

## 2020-07-28 DIAGNOSIS — K219 Gastro-esophageal reflux disease without esophagitis: Secondary | ICD-10-CM | POA: Diagnosis not present

## 2020-07-28 DIAGNOSIS — Z85038 Personal history of other malignant neoplasm of large intestine: Secondary | ICD-10-CM | POA: Diagnosis not present

## 2020-07-28 DIAGNOSIS — Z9889 Other specified postprocedural states: Secondary | ICD-10-CM | POA: Diagnosis not present

## 2020-07-28 DIAGNOSIS — C61 Malignant neoplasm of prostate: Secondary | ICD-10-CM | POA: Diagnosis not present

## 2020-08-04 DIAGNOSIS — D400 Neoplasm of uncertain behavior of prostate: Secondary | ICD-10-CM | POA: Diagnosis not present

## 2020-08-04 DIAGNOSIS — K219 Gastro-esophageal reflux disease without esophagitis: Secondary | ICD-10-CM | POA: Diagnosis not present

## 2020-08-04 DIAGNOSIS — Z85038 Personal history of other malignant neoplasm of large intestine: Secondary | ICD-10-CM | POA: Diagnosis not present

## 2020-08-04 DIAGNOSIS — Z9889 Other specified postprocedural states: Secondary | ICD-10-CM | POA: Diagnosis not present

## 2020-08-04 DIAGNOSIS — C61 Malignant neoplasm of prostate: Secondary | ICD-10-CM | POA: Diagnosis not present

## 2020-08-05 DIAGNOSIS — C61 Malignant neoplasm of prostate: Secondary | ICD-10-CM | POA: Diagnosis not present

## 2020-08-05 DIAGNOSIS — Z9889 Other specified postprocedural states: Secondary | ICD-10-CM | POA: Diagnosis not present

## 2020-08-05 DIAGNOSIS — D400 Neoplasm of uncertain behavior of prostate: Secondary | ICD-10-CM | POA: Diagnosis not present

## 2020-08-05 DIAGNOSIS — Z85038 Personal history of other malignant neoplasm of large intestine: Secondary | ICD-10-CM | POA: Diagnosis not present

## 2020-08-05 DIAGNOSIS — K219 Gastro-esophageal reflux disease without esophagitis: Secondary | ICD-10-CM | POA: Diagnosis not present

## 2020-08-07 DIAGNOSIS — Z85038 Personal history of other malignant neoplasm of large intestine: Secondary | ICD-10-CM | POA: Diagnosis not present

## 2020-08-07 DIAGNOSIS — C61 Malignant neoplasm of prostate: Secondary | ICD-10-CM | POA: Diagnosis not present

## 2020-08-07 DIAGNOSIS — K219 Gastro-esophageal reflux disease without esophagitis: Secondary | ICD-10-CM | POA: Diagnosis not present

## 2020-08-07 DIAGNOSIS — D400 Neoplasm of uncertain behavior of prostate: Secondary | ICD-10-CM | POA: Diagnosis not present

## 2020-08-07 DIAGNOSIS — Z9889 Other specified postprocedural states: Secondary | ICD-10-CM | POA: Diagnosis not present

## 2020-08-08 ENCOUNTER — Other Ambulatory Visit: Payer: Medicare HMO | Admitting: Urology

## 2020-08-08 ENCOUNTER — Ambulatory Visit (HOSPITAL_COMMUNITY): Admission: RE | Admit: 2020-08-08 | Payer: Medicare HMO | Source: Ambulatory Visit

## 2020-08-08 ENCOUNTER — Telehealth: Payer: Self-pay

## 2020-08-08 ENCOUNTER — Encounter (HOSPITAL_COMMUNITY): Payer: Self-pay

## 2020-08-08 DIAGNOSIS — Z9889 Other specified postprocedural states: Secondary | ICD-10-CM | POA: Diagnosis not present

## 2020-08-08 DIAGNOSIS — K219 Gastro-esophageal reflux disease without esophagitis: Secondary | ICD-10-CM | POA: Diagnosis not present

## 2020-08-08 DIAGNOSIS — D400 Neoplasm of uncertain behavior of prostate: Secondary | ICD-10-CM | POA: Diagnosis not present

## 2020-08-08 DIAGNOSIS — C61 Malignant neoplasm of prostate: Secondary | ICD-10-CM | POA: Diagnosis not present

## 2020-08-08 DIAGNOSIS — Z85038 Personal history of other malignant neoplasm of large intestine: Secondary | ICD-10-CM | POA: Diagnosis not present

## 2020-08-08 NOTE — Telephone Encounter (Signed)
Pt missed gold seed marker placement today per [facility]. Per daughter she states [facility] stated pt did not need markers.  I called [facility] and spoke with Vicente Males in Radiology. Pt opted out of doing seed placement per  Vicente Males.

## 2020-08-11 DIAGNOSIS — D400 Neoplasm of uncertain behavior of prostate: Secondary | ICD-10-CM | POA: Diagnosis not present

## 2020-08-11 DIAGNOSIS — Z85038 Personal history of other malignant neoplasm of large intestine: Secondary | ICD-10-CM | POA: Diagnosis not present

## 2020-08-11 DIAGNOSIS — C61 Malignant neoplasm of prostate: Secondary | ICD-10-CM | POA: Diagnosis not present

## 2020-08-11 DIAGNOSIS — K219 Gastro-esophageal reflux disease without esophagitis: Secondary | ICD-10-CM | POA: Diagnosis not present

## 2020-08-11 DIAGNOSIS — Z9889 Other specified postprocedural states: Secondary | ICD-10-CM | POA: Diagnosis not present

## 2020-08-12 DIAGNOSIS — Z9889 Other specified postprocedural states: Secondary | ICD-10-CM | POA: Diagnosis not present

## 2020-08-12 DIAGNOSIS — C61 Malignant neoplasm of prostate: Secondary | ICD-10-CM | POA: Diagnosis not present

## 2020-08-12 DIAGNOSIS — K219 Gastro-esophageal reflux disease without esophagitis: Secondary | ICD-10-CM | POA: Diagnosis not present

## 2020-08-12 DIAGNOSIS — D400 Neoplasm of uncertain behavior of prostate: Secondary | ICD-10-CM | POA: Diagnosis not present

## 2020-08-12 DIAGNOSIS — Z85038 Personal history of other malignant neoplasm of large intestine: Secondary | ICD-10-CM | POA: Diagnosis not present

## 2020-08-13 DIAGNOSIS — K219 Gastro-esophageal reflux disease without esophagitis: Secondary | ICD-10-CM | POA: Diagnosis not present

## 2020-08-13 DIAGNOSIS — D400 Neoplasm of uncertain behavior of prostate: Secondary | ICD-10-CM | POA: Diagnosis not present

## 2020-08-13 DIAGNOSIS — C61 Malignant neoplasm of prostate: Secondary | ICD-10-CM | POA: Diagnosis not present

## 2020-08-13 DIAGNOSIS — Z9889 Other specified postprocedural states: Secondary | ICD-10-CM | POA: Diagnosis not present

## 2020-08-13 DIAGNOSIS — Z85038 Personal history of other malignant neoplasm of large intestine: Secondary | ICD-10-CM | POA: Diagnosis not present

## 2020-08-14 DIAGNOSIS — D400 Neoplasm of uncertain behavior of prostate: Secondary | ICD-10-CM | POA: Diagnosis not present

## 2020-08-14 DIAGNOSIS — C61 Malignant neoplasm of prostate: Secondary | ICD-10-CM | POA: Diagnosis not present

## 2020-08-14 DIAGNOSIS — Z9889 Other specified postprocedural states: Secondary | ICD-10-CM | POA: Diagnosis not present

## 2020-08-14 DIAGNOSIS — Z85038 Personal history of other malignant neoplasm of large intestine: Secondary | ICD-10-CM | POA: Diagnosis not present

## 2020-08-14 DIAGNOSIS — K219 Gastro-esophageal reflux disease without esophagitis: Secondary | ICD-10-CM | POA: Diagnosis not present

## 2020-08-15 DIAGNOSIS — Z85038 Personal history of other malignant neoplasm of large intestine: Secondary | ICD-10-CM | POA: Diagnosis not present

## 2020-08-15 DIAGNOSIS — C61 Malignant neoplasm of prostate: Secondary | ICD-10-CM | POA: Diagnosis not present

## 2020-08-15 DIAGNOSIS — Z9889 Other specified postprocedural states: Secondary | ICD-10-CM | POA: Diagnosis not present

## 2020-08-15 DIAGNOSIS — D400 Neoplasm of uncertain behavior of prostate: Secondary | ICD-10-CM | POA: Diagnosis not present

## 2020-08-15 DIAGNOSIS — K219 Gastro-esophageal reflux disease without esophagitis: Secondary | ICD-10-CM | POA: Diagnosis not present

## 2020-08-19 ENCOUNTER — Encounter: Payer: Self-pay | Admitting: Urology

## 2020-08-19 DIAGNOSIS — C61 Malignant neoplasm of prostate: Secondary | ICD-10-CM | POA: Diagnosis not present

## 2020-08-19 DIAGNOSIS — D400 Neoplasm of uncertain behavior of prostate: Secondary | ICD-10-CM | POA: Diagnosis not present

## 2020-08-19 DIAGNOSIS — Z85038 Personal history of other malignant neoplasm of large intestine: Secondary | ICD-10-CM | POA: Diagnosis not present

## 2020-08-19 DIAGNOSIS — K219 Gastro-esophageal reflux disease without esophagitis: Secondary | ICD-10-CM | POA: Diagnosis not present

## 2020-08-19 DIAGNOSIS — Z9889 Other specified postprocedural states: Secondary | ICD-10-CM | POA: Diagnosis not present

## 2020-08-20 DIAGNOSIS — C61 Malignant neoplasm of prostate: Secondary | ICD-10-CM | POA: Diagnosis not present

## 2020-08-20 DIAGNOSIS — Z85038 Personal history of other malignant neoplasm of large intestine: Secondary | ICD-10-CM | POA: Diagnosis not present

## 2020-08-20 DIAGNOSIS — K219 Gastro-esophageal reflux disease without esophagitis: Secondary | ICD-10-CM | POA: Diagnosis not present

## 2020-08-20 DIAGNOSIS — D400 Neoplasm of uncertain behavior of prostate: Secondary | ICD-10-CM | POA: Diagnosis not present

## 2020-08-20 DIAGNOSIS — Z9889 Other specified postprocedural states: Secondary | ICD-10-CM | POA: Diagnosis not present

## 2020-08-21 DIAGNOSIS — K219 Gastro-esophageal reflux disease without esophagitis: Secondary | ICD-10-CM | POA: Diagnosis not present

## 2020-08-21 DIAGNOSIS — Z9889 Other specified postprocedural states: Secondary | ICD-10-CM | POA: Diagnosis not present

## 2020-08-21 DIAGNOSIS — C61 Malignant neoplasm of prostate: Secondary | ICD-10-CM | POA: Diagnosis not present

## 2020-08-21 DIAGNOSIS — Z85038 Personal history of other malignant neoplasm of large intestine: Secondary | ICD-10-CM | POA: Diagnosis not present

## 2020-08-21 DIAGNOSIS — D400 Neoplasm of uncertain behavior of prostate: Secondary | ICD-10-CM | POA: Diagnosis not present

## 2020-08-22 ENCOUNTER — Ambulatory Visit: Payer: Medicare HMO | Admitting: Urology

## 2020-08-22 DIAGNOSIS — K219 Gastro-esophageal reflux disease without esophagitis: Secondary | ICD-10-CM | POA: Diagnosis not present

## 2020-08-22 DIAGNOSIS — D400 Neoplasm of uncertain behavior of prostate: Secondary | ICD-10-CM | POA: Diagnosis not present

## 2020-08-22 DIAGNOSIS — Z85038 Personal history of other malignant neoplasm of large intestine: Secondary | ICD-10-CM | POA: Diagnosis not present

## 2020-08-22 DIAGNOSIS — C61 Malignant neoplasm of prostate: Secondary | ICD-10-CM | POA: Diagnosis not present

## 2020-08-22 DIAGNOSIS — Z9889 Other specified postprocedural states: Secondary | ICD-10-CM | POA: Diagnosis not present

## 2020-08-25 DIAGNOSIS — Z85038 Personal history of other malignant neoplasm of large intestine: Secondary | ICD-10-CM | POA: Diagnosis not present

## 2020-08-25 DIAGNOSIS — K219 Gastro-esophageal reflux disease without esophagitis: Secondary | ICD-10-CM | POA: Diagnosis not present

## 2020-08-25 DIAGNOSIS — C61 Malignant neoplasm of prostate: Secondary | ICD-10-CM | POA: Diagnosis not present

## 2020-08-25 DIAGNOSIS — Z9889 Other specified postprocedural states: Secondary | ICD-10-CM | POA: Diagnosis not present

## 2020-08-25 DIAGNOSIS — D400 Neoplasm of uncertain behavior of prostate: Secondary | ICD-10-CM | POA: Diagnosis not present

## 2020-08-26 DIAGNOSIS — Z85038 Personal history of other malignant neoplasm of large intestine: Secondary | ICD-10-CM | POA: Diagnosis not present

## 2020-08-26 DIAGNOSIS — C61 Malignant neoplasm of prostate: Secondary | ICD-10-CM | POA: Diagnosis not present

## 2020-08-26 DIAGNOSIS — Z9889 Other specified postprocedural states: Secondary | ICD-10-CM | POA: Diagnosis not present

## 2020-08-26 DIAGNOSIS — R937 Abnormal findings on diagnostic imaging of other parts of musculoskeletal system: Secondary | ICD-10-CM | POA: Diagnosis not present

## 2020-08-26 DIAGNOSIS — K219 Gastro-esophageal reflux disease without esophagitis: Secondary | ICD-10-CM | POA: Diagnosis not present

## 2020-08-26 DIAGNOSIS — D400 Neoplasm of uncertain behavior of prostate: Secondary | ICD-10-CM | POA: Diagnosis not present

## 2020-08-26 DIAGNOSIS — M25551 Pain in right hip: Secondary | ICD-10-CM | POA: Diagnosis not present

## 2020-08-27 DIAGNOSIS — Z9889 Other specified postprocedural states: Secondary | ICD-10-CM | POA: Diagnosis not present

## 2020-08-27 DIAGNOSIS — D400 Neoplasm of uncertain behavior of prostate: Secondary | ICD-10-CM | POA: Diagnosis not present

## 2020-08-27 DIAGNOSIS — Z85038 Personal history of other malignant neoplasm of large intestine: Secondary | ICD-10-CM | POA: Diagnosis not present

## 2020-08-27 DIAGNOSIS — C61 Malignant neoplasm of prostate: Secondary | ICD-10-CM | POA: Diagnosis not present

## 2020-08-27 DIAGNOSIS — K219 Gastro-esophageal reflux disease without esophagitis: Secondary | ICD-10-CM | POA: Diagnosis not present

## 2020-08-28 DIAGNOSIS — Z9889 Other specified postprocedural states: Secondary | ICD-10-CM | POA: Diagnosis not present

## 2020-08-28 DIAGNOSIS — D400 Neoplasm of uncertain behavior of prostate: Secondary | ICD-10-CM | POA: Diagnosis not present

## 2020-08-28 DIAGNOSIS — C61 Malignant neoplasm of prostate: Secondary | ICD-10-CM | POA: Diagnosis not present

## 2020-08-28 DIAGNOSIS — K219 Gastro-esophageal reflux disease without esophagitis: Secondary | ICD-10-CM | POA: Diagnosis not present

## 2020-08-28 DIAGNOSIS — Z85038 Personal history of other malignant neoplasm of large intestine: Secondary | ICD-10-CM | POA: Diagnosis not present

## 2020-08-29 DIAGNOSIS — D400 Neoplasm of uncertain behavior of prostate: Secondary | ICD-10-CM | POA: Diagnosis not present

## 2020-08-29 DIAGNOSIS — Z85038 Personal history of other malignant neoplasm of large intestine: Secondary | ICD-10-CM | POA: Diagnosis not present

## 2020-08-29 DIAGNOSIS — K219 Gastro-esophageal reflux disease without esophagitis: Secondary | ICD-10-CM | POA: Diagnosis not present

## 2020-08-29 DIAGNOSIS — Z9889 Other specified postprocedural states: Secondary | ICD-10-CM | POA: Diagnosis not present

## 2020-08-29 DIAGNOSIS — C61 Malignant neoplasm of prostate: Secondary | ICD-10-CM | POA: Diagnosis not present

## 2020-09-01 ENCOUNTER — Other Ambulatory Visit: Payer: Self-pay

## 2020-09-01 ENCOUNTER — Encounter (HOSPITAL_COMMUNITY): Payer: Self-pay | Admitting: Emergency Medicine

## 2020-09-01 ENCOUNTER — Emergency Department (HOSPITAL_COMMUNITY): Payer: Medicare HMO

## 2020-09-01 DIAGNOSIS — I482 Chronic atrial fibrillation, unspecified: Secondary | ICD-10-CM | POA: Diagnosis not present

## 2020-09-01 DIAGNOSIS — H9190 Unspecified hearing loss, unspecified ear: Secondary | ICD-10-CM | POA: Diagnosis present

## 2020-09-01 DIAGNOSIS — F17290 Nicotine dependence, other tobacco product, uncomplicated: Secondary | ICD-10-CM | POA: Diagnosis not present

## 2020-09-01 DIAGNOSIS — I129 Hypertensive chronic kidney disease with stage 1 through stage 4 chronic kidney disease, or unspecified chronic kidney disease: Secondary | ICD-10-CM | POA: Diagnosis present

## 2020-09-01 DIAGNOSIS — R0789 Other chest pain: Secondary | ICD-10-CM | POA: Diagnosis not present

## 2020-09-01 DIAGNOSIS — J9809 Other diseases of bronchus, not elsewhere classified: Secondary | ICD-10-CM | POA: Diagnosis not present

## 2020-09-01 DIAGNOSIS — N1831 Chronic kidney disease, stage 3a: Secondary | ICD-10-CM | POA: Diagnosis not present

## 2020-09-01 DIAGNOSIS — J189 Pneumonia, unspecified organism: Secondary | ICD-10-CM | POA: Diagnosis not present

## 2020-09-01 DIAGNOSIS — Z833 Family history of diabetes mellitus: Secondary | ICD-10-CM | POA: Diagnosis not present

## 2020-09-01 DIAGNOSIS — D649 Anemia, unspecified: Secondary | ICD-10-CM | POA: Diagnosis present

## 2020-09-01 DIAGNOSIS — N179 Acute kidney failure, unspecified: Secondary | ICD-10-CM | POA: Diagnosis not present

## 2020-09-01 DIAGNOSIS — K219 Gastro-esophageal reflux disease without esophagitis: Secondary | ICD-10-CM | POA: Diagnosis present

## 2020-09-01 DIAGNOSIS — N400 Enlarged prostate without lower urinary tract symptoms: Secondary | ICD-10-CM | POA: Diagnosis present

## 2020-09-01 DIAGNOSIS — J181 Lobar pneumonia, unspecified organism: Secondary | ICD-10-CM | POA: Diagnosis not present

## 2020-09-01 DIAGNOSIS — D400 Neoplasm of uncertain behavior of prostate: Secondary | ICD-10-CM | POA: Diagnosis not present

## 2020-09-01 DIAGNOSIS — S32591D Other specified fracture of right pubis, subsequent encounter for fracture with routine healing: Secondary | ICD-10-CM | POA: Diagnosis not present

## 2020-09-01 DIAGNOSIS — Z9889 Other specified postprocedural states: Secondary | ICD-10-CM | POA: Diagnosis not present

## 2020-09-01 DIAGNOSIS — Z8249 Family history of ischemic heart disease and other diseases of the circulatory system: Secondary | ICD-10-CM

## 2020-09-01 DIAGNOSIS — W010XXD Fall on same level from slipping, tripping and stumbling without subsequent striking against object, subsequent encounter: Secondary | ICD-10-CM | POA: Diagnosis present

## 2020-09-01 DIAGNOSIS — J9601 Acute respiratory failure with hypoxia: Secondary | ICD-10-CM | POA: Diagnosis present

## 2020-09-01 DIAGNOSIS — R0902 Hypoxemia: Secondary | ICD-10-CM | POA: Diagnosis not present

## 2020-09-01 DIAGNOSIS — R0602 Shortness of breath: Secondary | ICD-10-CM | POA: Diagnosis not present

## 2020-09-01 DIAGNOSIS — C61 Malignant neoplasm of prostate: Secondary | ICD-10-CM | POA: Diagnosis not present

## 2020-09-01 DIAGNOSIS — R0781 Pleurodynia: Secondary | ICD-10-CM | POA: Diagnosis not present

## 2020-09-01 DIAGNOSIS — Z20822 Contact with and (suspected) exposure to covid-19: Secondary | ICD-10-CM | POA: Diagnosis present

## 2020-09-01 DIAGNOSIS — Z85038 Personal history of other malignant neoplasm of large intestine: Secondary | ICD-10-CM | POA: Diagnosis not present

## 2020-09-01 DIAGNOSIS — R7989 Other specified abnormal findings of blood chemistry: Secondary | ICD-10-CM | POA: Diagnosis not present

## 2020-09-01 NOTE — ED Triage Notes (Signed)
Patient fell outside last week, tripping over rock, had x-rays done outpatient in [place], [place] resulting in right crack femur, but today is having right ribcage pain and flu-like symptoms.

## 2020-09-02 ENCOUNTER — Emergency Department (HOSPITAL_COMMUNITY): Payer: Medicare HMO

## 2020-09-02 ENCOUNTER — Other Ambulatory Visit: Payer: Self-pay

## 2020-09-02 ENCOUNTER — Inpatient Hospital Stay (HOSPITAL_COMMUNITY)
Admission: EM | Admit: 2020-09-02 | Discharge: 2020-09-04 | DRG: 193 | Disposition: A | Discharge status: Home / Self Care | Payer: Medicare HMO | Attending: Internal Medicine | Admitting: Internal Medicine

## 2020-09-02 DIAGNOSIS — R0602 Shortness of breath: Secondary | ICD-10-CM | POA: Diagnosis not present

## 2020-09-02 DIAGNOSIS — I129 Hypertensive chronic kidney disease with stage 1 through stage 4 chronic kidney disease, or unspecified chronic kidney disease: Secondary | ICD-10-CM | POA: Diagnosis present

## 2020-09-02 DIAGNOSIS — J9601 Acute respiratory failure with hypoxia: Secondary | ICD-10-CM

## 2020-09-02 DIAGNOSIS — R7989 Other specified abnormal findings of blood chemistry: Secondary | ICD-10-CM | POA: Diagnosis not present

## 2020-09-02 DIAGNOSIS — C61 Malignant neoplasm of prostate: Secondary | ICD-10-CM | POA: Diagnosis present

## 2020-09-02 DIAGNOSIS — J181 Lobar pneumonia, unspecified organism: Secondary | ICD-10-CM | POA: Diagnosis present

## 2020-09-02 DIAGNOSIS — I482 Chronic atrial fibrillation, unspecified: Secondary | ICD-10-CM | POA: Diagnosis present

## 2020-09-02 DIAGNOSIS — D649 Anemia, unspecified: Secondary | ICD-10-CM | POA: Diagnosis present

## 2020-09-02 DIAGNOSIS — J9691 Respiratory failure, unspecified with hypoxia: Secondary | ICD-10-CM | POA: Diagnosis present

## 2020-09-02 DIAGNOSIS — R0789 Other chest pain: Secondary | ICD-10-CM

## 2020-09-02 DIAGNOSIS — Z8249 Family history of ischemic heart disease and other diseases of the circulatory system: Secondary | ICD-10-CM | POA: Diagnosis not present

## 2020-09-02 DIAGNOSIS — W010XXD Fall on same level from slipping, tripping and stumbling without subsequent striking against object, subsequent encounter: Secondary | ICD-10-CM | POA: Diagnosis present

## 2020-09-02 DIAGNOSIS — Z833 Family history of diabetes mellitus: Secondary | ICD-10-CM | POA: Diagnosis not present

## 2020-09-02 DIAGNOSIS — N179 Acute kidney failure, unspecified: Secondary | ICD-10-CM | POA: Diagnosis present

## 2020-09-02 DIAGNOSIS — J9809 Other diseases of bronchus, not elsewhere classified: Secondary | ICD-10-CM | POA: Diagnosis not present

## 2020-09-02 DIAGNOSIS — N400 Enlarged prostate without lower urinary tract symptoms: Secondary | ICD-10-CM | POA: Diagnosis present

## 2020-09-02 DIAGNOSIS — Z20822 Contact with and (suspected) exposure to covid-19: Secondary | ICD-10-CM | POA: Diagnosis present

## 2020-09-02 DIAGNOSIS — S32591D Other specified fracture of right pubis, subsequent encounter for fracture with routine healing: Secondary | ICD-10-CM | POA: Diagnosis not present

## 2020-09-02 DIAGNOSIS — J189 Pneumonia, unspecified organism: Secondary | ICD-10-CM

## 2020-09-02 DIAGNOSIS — H9190 Unspecified hearing loss, unspecified ear: Secondary | ICD-10-CM | POA: Diagnosis present

## 2020-09-02 DIAGNOSIS — F17290 Nicotine dependence, other tobacco product, uncomplicated: Secondary | ICD-10-CM | POA: Diagnosis present

## 2020-09-02 DIAGNOSIS — K219 Gastro-esophageal reflux disease without esophagitis: Secondary | ICD-10-CM | POA: Diagnosis present

## 2020-09-02 DIAGNOSIS — N1831 Chronic kidney disease, stage 3a: Secondary | ICD-10-CM | POA: Diagnosis present

## 2020-09-02 DIAGNOSIS — R0902 Hypoxemia: Secondary | ICD-10-CM

## 2020-09-02 DIAGNOSIS — Z85038 Personal history of other malignant neoplasm of large intestine: Secondary | ICD-10-CM | POA: Diagnosis not present

## 2020-09-02 LAB — CBC WITH DIFFERENTIAL/PLATELET
Abs Immature Granulocytes: 0.12 10*3/uL — ABNORMAL HIGH (ref 0.00–0.07)
Abs Immature Granulocytes: 0.11 10*3/uL — ABNORMAL HIGH (ref 0.00–0.07)
Basophils Absolute: 0 10*3/uL (ref 0.0–0.1)
Basophils Absolute: 0 10*3/uL (ref 0.0–0.1)
Basophils Relative: 0 %
Basophils Relative: 0 %
Eosinophils Absolute: 0 10*3/uL (ref 0.0–0.5)
Eosinophils Absolute: 0.1 10*3/uL (ref 0.0–0.5)
Eosinophils Relative: 0 %
Eosinophils Relative: 1 %
HCT: 33.2 % — ABNORMAL LOW (ref 39.0–52.0)
HCT: 30.8 % — ABNORMAL LOW (ref 39.0–52.0)
Hemoglobin: 10.1 g/dL — ABNORMAL LOW (ref 13.0–17.0)
Hemoglobin: 9.2 g/dL — ABNORMAL LOW (ref 13.0–17.0)
Immature Granulocytes: 2 %
Immature Granulocytes: 2 %
Lymphocytes Relative: 4 %
Lymphocytes Relative: 7 %
Lymphs Abs: 0.3 10*3/uL — ABNORMAL LOW (ref 0.7–4.0)
Lymphs Abs: 0.5 10*3/uL — ABNORMAL LOW (ref 0.7–4.0)
MCH: 31.7 pg (ref 26.0–34.0)
MCH: 31.2 pg (ref 26.0–34.0)
MCHC: 30.4 g/dL (ref 30.0–36.0)
MCHC: 29.9 g/dL — ABNORMAL LOW (ref 30.0–36.0)
MCV: 104.1 fL — ABNORMAL HIGH (ref 80.0–100.0)
MCV: 104.4 fL — ABNORMAL HIGH (ref 80.0–100.0)
Monocytes Absolute: 0.8 10*3/uL (ref 0.1–1.0)
Monocytes Absolute: 0.8 10*3/uL (ref 0.1–1.0)
Monocytes Relative: 10 %
Monocytes Relative: 12 %
Neutro Abs: 6.7 10*3/uL (ref 1.7–7.7)
Neutro Abs: 5.4 10*3/uL (ref 1.7–7.7)
Neutrophils Relative %: 84 %
Neutrophils Relative %: 78 %
Platelets: 212 10*3/uL (ref 150–400)
Platelets: 229 10*3/uL (ref 150–400)
RBC: 3.19 MIL/uL — ABNORMAL LOW (ref 4.22–5.81)
RBC: 2.95 MIL/uL — ABNORMAL LOW (ref 4.22–5.81)
RDW: 13.7 % (ref 11.5–15.5)
RDW: 13.8 % (ref 11.5–15.5)
WBC: 8 10*3/uL (ref 4.0–10.5)
WBC: 6.9 10*3/uL (ref 4.0–10.5)
nRBC: 0 % (ref 0.0–0.2)
nRBC: 0 % (ref 0.0–0.2)

## 2020-09-02 LAB — BASIC METABOLIC PANEL
Anion gap: 12 (ref 5–15)
BUN: 84 mg/dL — ABNORMAL HIGH (ref 8–23)
CO2: 16 mmol/L — ABNORMAL LOW (ref 22–32)
Calcium: 9.5 mg/dL (ref 8.9–10.3)
Chloride: 109 mmol/L (ref 98–111)
Creatinine, Ser: 2.81 mg/dL — ABNORMAL HIGH (ref 0.61–1.24)
GFR calc Af Amer: 22 mL/min — ABNORMAL LOW (ref >60)
GFR calc non Af Amer: 19 mL/min — ABNORMAL LOW (ref >60)
Glucose, Bld: 108 mg/dL — ABNORMAL HIGH (ref 70–99)
Potassium: 4.8 mmol/L (ref 3.5–5.1)
Sodium: 137 mmol/L (ref 135–145)

## 2020-09-02 LAB — COMPREHENSIVE METABOLIC PANEL
ALT: 29 U/L (ref 0–44)
AST: 18 U/L (ref 15–41)
Albumin: 3.2 g/dL — ABNORMAL LOW (ref 3.5–5.0)
Alkaline Phosphatase: 81 U/L (ref 38–126)
Anion gap: 9 (ref 5–15)
BUN: 81 mg/dL — ABNORMAL HIGH (ref 8–23)
CO2: 18 mmol/L — ABNORMAL LOW (ref 22–32)
Calcium: 9.1 mg/dL (ref 8.9–10.3)
Chloride: 112 mmol/L — ABNORMAL HIGH (ref 98–111)
Creatinine, Ser: 2.46 mg/dL — ABNORMAL HIGH (ref 0.61–1.24)
GFR calc Af Amer: 26 mL/min — ABNORMAL LOW (ref >60)
GFR calc non Af Amer: 22 mL/min — ABNORMAL LOW (ref >60)
Glucose, Bld: 97 mg/dL (ref 70–99)
Potassium: 4.5 mmol/L (ref 3.5–5.1)
Sodium: 139 mmol/L (ref 135–145)
Total Bilirubin: 0.7 mg/dL (ref 0.3–1.2)
Total Protein: 6.4 g/dL — ABNORMAL LOW (ref 6.5–8.1)

## 2020-09-02 LAB — SARS CORONAVIRUS 2 BY RT PCR (HOSPITAL ORDER, PERFORMED IN ~~LOC~~ HOSPITAL LAB): SARS Coronavirus 2: NEGATIVE

## 2020-09-02 LAB — MAGNESIUM: Magnesium: 2 mg/dL (ref 1.7–2.4)

## 2020-09-02 MED ORDER — ACETAMINOPHEN 325 MG PO TABS: 650.0000 mg | ORAL_TABLET | Freq: Four times a day (QID) | ORAL | Status: DC | PRN

## 2020-09-02 MED ORDER — PANTOPRAZOLE SODIUM 40 MG PO TBEC
40.0000 mg | DELAYED_RELEASE_TABLET | Freq: Every day | ORAL | Status: DC
  Administered 2020-09-02 – 2020-09-04 (×3): 40 mg via ORAL
  Filled 2020-09-02 (×3): qty 1

## 2020-09-02 MED ORDER — INFLUENZA VAC A&B SA ADJ QUAD 0.5 ML IM PRSY: 0.5000 mL | PREFILLED_SYRINGE | INTRAMUSCULAR | Status: DC

## 2020-09-02 MED ORDER — SODIUM CHLORIDE 0.9 % IV SOLN
1.0000 g | INTRAVENOUS | Status: DC
  Administered 2020-09-02 – 2020-09-03 (×2): 1 g via INTRAVENOUS
  Filled 2020-09-02 (×2): qty 10

## 2020-09-02 MED ORDER — ALBUTEROL SULFATE (2.5 MG/3ML) 0.083% IN NEBU: 2.5000 mg | INHALATION_SOLUTION | Freq: Four times a day (QID) | RESPIRATORY_TRACT | Status: DC | PRN

## 2020-09-02 MED ORDER — SODIUM CHLORIDE 0.9 % IV BOLUS
500.0000 mL | Freq: Once | INTRAVENOUS | Status: AC
  Administered 2020-09-02: 500 mL via INTRAVENOUS

## 2020-09-02 MED ORDER — HEPARIN SODIUM (PORCINE) 5000 UNIT/ML IJ SOLN
5000.0000 [IU] | Freq: Three times a day (TID) | INTRAMUSCULAR | Status: DC
  Administered 2020-09-02 – 2020-09-04 (×7): 5000 [IU] via SUBCUTANEOUS
  Filled 2020-09-02 (×8): qty 1

## 2020-09-02 MED ORDER — POLYETHYLENE GLYCOL 3350 17 G PO PACK: 17.0000 g | PACK | Freq: Every day | ORAL | Status: DC | PRN

## 2020-09-02 MED ORDER — ONDANSETRON HCL 4 MG/2ML IJ SOLN
4.0000 mg | Freq: Four times a day (QID) | INTRAMUSCULAR | Status: DC | PRN
  Administered 2020-09-02: 4 mg via INTRAVENOUS
  Filled 2020-09-02: qty 2

## 2020-09-02 MED ORDER — ACETAMINOPHEN 650 MG RE SUPP: 650.0000 mg | Freq: Four times a day (QID) | RECTAL | Status: DC | PRN

## 2020-09-02 MED ORDER — ONDANSETRON HCL 4 MG PO TABS: 4.0000 mg | ORAL_TABLET | Freq: Four times a day (QID) | ORAL | Status: DC | PRN

## 2020-09-02 MED ORDER — DIPHENHYDRAMINE-APAP (SLEEP) 25-500 MG PO TABS: 2.0000 | ORAL_TABLET | Freq: Every day | ORAL | Status: DC

## 2020-09-02 MED ORDER — HYDROCODONE-ACETAMINOPHEN 5-325 MG PO TABS
1.0000 | ORAL_TABLET | Freq: Two times a day (BID) | ORAL | Status: DC | PRN
  Administered 2020-09-02: 1 via ORAL
  Filled 2020-09-02: qty 1

## 2020-09-02 MED ORDER — ACETAMINOPHEN 325 MG PO TABS
650.0000 mg | ORAL_TABLET | Freq: Once | ORAL | Status: AC
  Administered 2020-09-02: 650 mg via ORAL
  Filled 2020-09-02: qty 2

## 2020-09-02 MED ORDER — SODIUM CHLORIDE 0.9 % IV SOLN
500.0000 mg | INTRAVENOUS | Status: DC
  Administered 2020-09-02 – 2020-09-04 (×3): 500 mg via INTRAVENOUS
  Filled 2020-09-02 (×3): qty 500

## 2020-09-02 MED ORDER — HYDROCODONE-ACETAMINOPHEN 5-325 MG PO TABS
1.0000 | ORAL_TABLET | Freq: Once | ORAL | Status: AC
  Administered 2020-09-02: 1 via ORAL
  Filled 2020-09-02: qty 1

## 2020-09-02 MED ORDER — TAMSULOSIN HCL 0.4 MG PO CAPS
0.4000 mg | ORAL_CAPSULE | Freq: Every day | ORAL | Status: DC
  Administered 2020-09-02 – 2020-09-04 (×3): 0.4 mg via ORAL
  Filled 2020-09-02 (×3): qty 1

## 2020-09-02 MED ORDER — SODIUM CHLORIDE 0.9 % IV SOLN
1.0000 g | Freq: Once | INTRAVENOUS | Status: AC
  Administered 2020-09-02: 1 g via INTRAVENOUS
  Filled 2020-09-02: qty 10

## 2020-09-02 MED ORDER — MORPHINE SULFATE (PF) 2 MG/ML IV SOLN
2.0000 mg | INTRAVENOUS | Status: DC | PRN
  Administered 2020-09-02 – 2020-09-04 (×6): 2 mg via INTRAVENOUS
  Filled 2020-09-02 (×6): qty 1

## 2020-09-02 MED ORDER — DIPHENHYDRAMINE HCL 25 MG PO CAPS
50.0000 mg | ORAL_CAPSULE | Freq: Every day | ORAL | Status: DC
  Administered 2020-09-02 – 2020-09-03 (×2): 50 mg via ORAL
  Filled 2020-09-02 (×2): qty 2

## 2020-09-02 MED ORDER — SODIUM CHLORIDE 0.9 % IV SOLN: INTRAVENOUS | Status: DC

## 2020-09-02 MED ORDER — ACETAMINOPHEN 500 MG PO TABS
1000.0000 mg | ORAL_TABLET | Freq: Every day | ORAL | Status: DC
  Administered 2020-09-02 – 2020-09-03 (×2): 1000 mg via ORAL
  Filled 2020-09-02 (×2): qty 2

## 2020-09-02 NOTE — ED Provider Notes (Signed)
[place] Provider Note   CSN: [id number] Arrival date & time: 09/01/20  1546     History Chief Complaint  Patient presents with  . Fall    Tony Holt is a 86 y.o. male.  HPI     This is a 86 year old male with a history of colon cancer and prostate cancer, atrial fibrillation, hypertension who presents with recent fall and right-sided chest pain.  Patient reports he fell approximately 1 week ago.  He reports tripping and falling on his right side.  He sustained a right inferior pubic ramus fracture.  He is weightbearing as tolerated.  However over the last day he is reported increasing right-sided chest pain.  It is worse with breathing.  He reports some shortness of breath.  No fevers or cough.  He is not a smoker and no history of COPD.  No known sick contacts or Covid exposures.  He has been fully vaccinated against COVID-19.  Denies any exertional symptoms.  Denies nausea, vomiting, abdominal pain.  Does report frequent loose stools.  Past Medical History:  Diagnosis Date  . BPH (benign prostatic hyperplasia)   . Colon cancer ([place]) 1999  . Dysrhythmia    new onset A Fib  . Essential hypertension   . GERD (gastroesophageal reflux disease)   . Hard of hearing   . Prostate cancer [facility])     Patient Active Problem List   Diagnosis Date Noted  . Elevated PSA 04/28/2020  . AKI (acute kidney injury) ([place]) 02/23/2020  . Prostate cancer ([place]) 02/22/2020  . Atrial fibrillation, chronic ([place]) 02/22/2020  . Cecal cancer ([place])   . IDA (iron deficiency anemia) 05/17/2019  . Constipation 05/17/2019  . Bradycardia 05/28/2016  . Weakness 05/28/2016  . Acute renal failure ([place]) 05/28/2016  . Hyperkalemia 05/28/2016  . Nausea alone 03/24/2012  . Abdominal pain, epigastric 03/24/2012  . HTN (hypertension) 03/24/2012  . ANEMIA 09/16/2010  . HEMOCCULT POSITIVE STOOL 09/16/2010    Past Surgical History:  Procedure Laterality Date  . BIOPSY  12/28/2019     Procedure: BIOPSY;  Surgeon: Daneil Dolin, MD;  Location: [facility] SU[facility];  Service: Endoscopy;;  gastric, esophagus,cecal  . CATARACT EXTRACTION W/PHACO  01/18/2013   Procedure: CATARACT EXTRACTION PHACO AND INTRAOCULAR LENS PLACEMENT ([place]);  Surgeon: Tonny Branch, MD;  Location: [facility] ORS;  Service: Ophthalmology;  Laterality: Left;  CDE 17.11  . CATARACT EXTRACTION W/PHACO Right 02/05/2013   Procedure: CATARACT EXTRACTION PHACO AND INTRAOCULAR LENS PLACEMENT (I[facility]);  Surgeon: Tonny Branch, MD;  Location: [facility] ORS;  Service: Ophthalmology;  Laterality: Right;  CDE:19.49  . COLON SURGERY  1999   Sigmoid colon cancer, segmental resection by Drs. Smith/Bradford  . COLONOSCOPY  2002   Multiple adenomatous colon polyps  . COLONOSCOPY N/A 12/28/2019   Procedure: COLONOSCOPY;  Surgeon: Daneil Dolin, MD;  Location: [facility];  Service: Endoscopy;  Laterality: N/A;  . ESOPHAGOGASTRODUODENOSCOPY  03/25/2012   Dr. Gala Romney: Severe ulcerative/erosive reflux esophagitis.  Small hiatal hernia.  Abnormal gastric and duodenal mucosa, biopsy showed reactive changes and minimal chronic inflammation but no H. pylori.  . ESOPHAGOGASTRODUODENOSCOPY N/A 12/28/2019   Procedure: ESOPHAGOGASTRODUODENOSCOPY (EGD);  Surgeon: Daneil Dolin, MD;  Location: [facility];  Service: Endoscopy;  Laterality: N/A;  . HERNIA REPAIR  02/2011   Right inguinal hernia repair with mesh, Dr. Geroge Baseman  . HERNIA REPAIR     three total  . PARTIAL COLECTOMY Right 02/04/2020   Procedure: RIGHT HEMI-COLECTOMY;  Surgeon:  Virl Cagey, MD;  Location: [facility] ORS;  Service: General;  Laterality: Right;  . POLYPECTOMY  12/28/2019   Procedure: POLYPECTOMY;  Surgeon: Daneil Dolin, MD;  Location: [facility];  Service: Endoscopy;;  rectal        Family History  Problem Relation Age of Onset  . Heart attack Father        Deceased, age 74s  . Diabetes Brother   . Colon cancer Neg Hx   . Liver disease Neg Hx   . Breast cancer Neg Hx   .  Prostate cancer Neg Hx   . Pancreatic cancer Neg Hx     Social History   Tobacco Use  . Smoking status: Former Smoker    Packs/day: 1.00    Years: 50.00    Pack years: 50.00    Types: Cigarettes    Quit date: 12/13/1989    Years since quitting: 30.7  . Smokeless tobacco: Current User    Types: Snuff  Vaping Use  . Vaping Use: Never used  Substance Use Topics  . Alcohol use: No    Alcohol/week: 0.0 standard drinks    Comment: previous heavy drinker at age 69-30  . Drug use: No    Home Medications Prior to Admission medications   Medication Sig Start Date End Date Taking? Authorizing Provider  diphenhydramine-acetaminophen (TYLENOL PM) 25-500 MG TABS tablet Take 2 tablets by mouth at bedtime.     [provider]  docusate sodium (COLACE) 100 MG capsule Take 1 capsule (100 mg total) by mouth 2 (two) times daily as needed for mild constipation. 02/07/20   Virl Cagey, MD  Ensure (ENSURE) Take 237 mLs by mouth 3 (three) times daily between meals.    [provider]  HYDROcodone-acetaminophen (NORCO/VICODIN) 5-325 MG tablet Take 1 tablet by mouth every 12 (twelve) hours as needed for moderate pain.  05/13/16   [provider]  omeprazole (PRILOSEC) 20 MG capsule Take 20 mg by mouth 2 (two) times daily. 05/09/16   [provider]  Pediatric Multiple Vitamins (FLINTSTONES MULTIVITAMIN PO) Take 1 tablet by mouth daily.     [provider]  Probiotic Product (CULTURELLE PROBIOTICS PO) Take by mouth daily.    [provider]  Tamsulosin HCl (FLOMAX) 0.4 MG CAPS Take 1 capsule (0.4 mg total) by mouth daily. 03/27/12   Sinda Du, MD    Allergies    Patient has no known allergies.  Review of Systems   Review of Systems  Constitutional: Negative for fever.  Respiratory: Positive for cough and shortness of breath.   Cardiovascular: Positive for chest pain. Negative for leg swelling.  Gastrointestinal: Positive for diarrhea.  Negative for abdominal pain, nausea and vomiting.  Genitourinary: Negative for dysuria.  Neurological: Negative for light-headedness.  All other systems reviewed and are negative.   Physical Exam Updated Vital Signs BP 118/64   Pulse 98   Temp 97.9 F (36.6 C) (Oral)   Resp 18   Ht 1.74 m (5' 8.5")   Wt 59.9 kg   SpO2 95%   BMI 19.78 kg/m   Physical Exam Vitals and nursing note reviewed.  Constitutional:      Appearance: He is well-developed. He is not ill-appearing.     Comments: Elderly, nontoxic-appearing  HENT:     Head: Normocephalic and atraumatic.     Mouth/Throat:     Mouth: Mucous membranes are moist.  Eyes:     Pupils: Pupils are equal, round, and reactive  to light.  Cardiovascular:     Rate and Rhythm: Normal rate and regular rhythm.     Heart sounds: Normal heart sounds. No murmur heard.   Pulmonary:     Effort: Pulmonary effort is normal. No respiratory distress.     Breath sounds: Rhonchi present. No wheezing.     Comments: Right-sided chest wall tenderness to palpation, no overlying skin changes or crepitus noted Chest:     Chest wall: Tenderness present.  Abdominal:     General: Bowel sounds are normal. There is no distension.     Palpations: Abdomen is soft.     Tenderness: There is no abdominal tenderness. There is no rebound.  Musculoskeletal:        General: No deformity.     Cervical back: Neck supple.     Right lower leg: No edema.     Left lower leg: No edema.  Lymphadenopathy:     Cervical: No cervical adenopathy.  Skin:    General: Skin is warm and dry.  Neurological:     Mental Status: He is alert and oriented to person, place, and time.  Psychiatric:        Mood and Affect: Mood normal.     ED Results / Procedures / Treatments   Labs (all labs ordered are listed, but only abnormal results are displayed) Labs Reviewed  CBC WITH DIFFERENTIAL/PLATELET - Abnormal; Notable for the following components:      Result Value   RBC  3.19 (*)    Hemoglobin 10.1 (*)    HCT 33.2 (*)    MCV 104.1 (*)    Lymphs Abs 0.3 (*)    Abs Immature Granulocytes 0.12 (*)    All other components within normal limits  BASIC METABOLIC PANEL - Abnormal; Notable for the following components:   CO2 16 (*)    Glucose, Bld 108 (*)    BUN 84 (*)    Creatinine, Ser 2.81 (*)    GFR calc non Af Amer 19 (*)    GFR calc Af Amer 22 (*)    All other components within normal limits  SARS CORONAVIRUS 2 BY RT PCR [facility] ORDER, [place] [facility])    EKG EKG Interpretation  Date/Time:  Tuesday September 02 2020 00:43:50 EDT Ventricular Rate:  93 PR Interval:    QRS Duration: 79 QT Interval:  351 QTC Calculation: 437 R Axis:   18 Text Interpretation: Atrial fibrillation Minimal ST depression, lateral leads Confirmed by Thayer Jew [phone]) on 09/02/2020 1:40:15 AM   Radiology DG Ribs Unilateral W/Chest Right  Result Date: 09/01/2020 CLINICAL DATA:  Pain status post fall EXAM: RIGHT RIBS AND CHEST - 3+ VIEW COMPARISON:  June 21, 2019 FINDINGS: There are new airspace opacities in the right upper lung zone. There is no pneumothorax. No large pleural effusion. There is stable blunting of the costophrenic angles bilaterally. There is a nodular density in the left lower lung zone. The heart size is stable. Aortic calcifications are noted. Again noted is a compression fracture of the L1 vertebral body. There are few air-fluid levels in the abdomen, especially on the left. The liver shadow appears enlarged. IMPRESSION: 1. No acute displaced rib fracture. 2. New airspace opacities in the right upper lung zone may represent developing pneumonia or aspiration. 3. Nodular density in the left lower lung zone. A 4-6 week follow-up two-view chest x-ray is recommended to confirm resolution of this finding. 4. Air-fluid levels in the left mid abdomen  may represent an ileus or developing small bowel obstruction. Electronically Signed   By:  Constance Holster M.D.   On: 09/01/2020 17:50    Procedures .Critical Care Performed by: Merryl Hacker, MD Authorized by: Merryl Hacker, MD   Critical care provider statement:    Critical care time (minutes):  35   Critical care was time spent personally by me on the following activities:  Discussions with consultants, evaluation of patient's response to treatment, examination of patient, ordering and performing treatments and interventions, ordering and review of laboratory studies, ordering and review of radiographic studies, pulse oximetry, re-evaluation of patient's condition, obtaining history from patient or surrogate and review of old charts   (including critical care time)  Medications Ordered in ED Medications  cefTRIAXone (ROCEPHIN) 1 g in sodium chloride 0.9 % 100 mL IVPB (has no administration in time range)  azithromycin (ZITHROMAX) 500 mg in sodium chloride 0.9 % 250 mL IVPB (has no administration in time range)  sodium chloride 0.9 % bolus 500 mL (has no administration in time range)  acetaminophen (TYLENOL) tablet 650 mg (650 mg Oral Given 09/02/20 0115)    ED Course  I have reviewed the triage vital signs and the nursing notes.  Pertinent labs & imaging results that were available during my care of the patient were reviewed by me and considered in my medical decision making (see chart for details).    MDM Rules/Calculators/A&P                           Patient presents with chest discomfort.  Reports fall 1 week ago.  Has an inferior pubic ramus fracture.  He is overall nontoxic appearing.  Initial O2 sats noted to be in the low 90s.  He is not in any respiratory distress.  Remote history of smoking but no history of COPD.  He is afebrile.  Considerations include but not limited to, rib fracture, pneumonia, pneumothorax.  Less likely ACS or PE.  Patient x-ray reviewed.  Concerning for possible early infiltrate.  He also has a nodule in the left lung which  needs follow-up imaging.  COVID-19 testing was sent given current pandemic.  This is negative.  Lab work obtained and no significant leukocytosis.  He does have an AKI with a creatinine greater than 2.  Baseline is close to 1.5.  He was given 500 cc of fluid.  Recent echocardiogram with normal EF.  EKG shows no evidence of acute ischemia or arrhythmia.  Patient ultimately desaturated requiring 2 L of nasal cannula.  With minimal exertion, desats into the 70s.  Given his AKI, he cannot get a contrasted scan of his chest.  He was covered with Rocephin and azithromycin for possible community-acquired pneumonia.  Given other chest x-ray findings, full CT scan would be reasonable to evaluate this nodule as well as any other potential process contributing to his hypoxia.  CT noncontrast ordered.  Will plan for admission to the hospitalist.  Tony Holt was evaluated in Emergency Department on 09/02/2020 for the symptoms described in the history of present illness. He was evaluated in the context of the global COVID-19 pandemic, which necessitated consideration that the patient might be at risk for infection with the SARS-CoV-2 virus that causes COVID-19. Institutional protocols and algorithms that pertain to the evaluation of patients at risk for COVID-19 are in a state of rapid change based on information released by regulatory bodies including the CDC and  federal and state organizations. These policies and algorithms were followed during the patient's care in the ED.  Final Clinical Impression(s) / ED Diagnoses Final diagnoses:  Community acquired pneumonia, unspecified laterality  Hypoxia  Atypical chest pain    Rx / DC Orders ED Discharge Orders    None       Santo Zahradnik, Barbette Hair, MD 09/02/20 [phone]

## 2020-09-02 NOTE — TOC Progression Note (Signed)
Transition of Care [facility]) - Progression Note    Patient Details  Name: Tony Holt MRN: [id number] Date of Birth: 14-May-1926  Transition of [facility]) CM/SW Contact  Natasha Bence, LCSW Phone Number: 09/02/2020, 1:24 PM  Clinical Narrative:    CSW received order for [facility]. CSW contacted patient's family to inquire about agreeableness to [facility]. Family declined HH. Family reported that they walk patient regularly and help him with any other physical needs. TOC to follow.        Expected Discharge Plan and Services                                                 Social Determinants of Health (SDOH) Interventions    Readmission Risk Interventions Readmission Risk Prevention Plan 02/07/2020  Transportation Screening Complete  PCP or Specialist Appt within 3-5 Days Not Complete  Not Complete comments office not accepting voicemails. states they will reopen tomorrow at 10.  [place] or Home Care Consult Complete  Social Work Consult for [place] Planning/Counseling Complete  Palliative Care Screening Not Applicable  Medication Review [employer]) Complete  Some recent data might be hidden

## 2020-09-02 NOTE — ED Notes (Signed)
PT at bedside.

## 2020-09-02 NOTE — ED Notes (Signed)
Pt placed on 2lpm due to o2 being 88% on room air, edp notified.

## 2020-09-02 NOTE — Plan of Care (Signed)
°  Problem: Acute Rehab PT Goals(only PT should resolve) Goal: Pt Will Go Supine/Side To Sit Outcome: Progressing Flowsheets (Taken 09/02/2020 1505) Pt will go Supine/Side to Sit: with supervision Goal: Patient Will Transfer Sit To/From Stand Outcome: Progressing Flowsheets (Taken 09/02/2020 1505) Patient will transfer sit to/from stand:  with min guard assist  with minimal assist Goal: Pt Will Transfer Bed To Chair/Chair To Bed Outcome: Progressing Flowsheets (Taken 09/02/2020 1505) Pt will Transfer Bed to Chair/Chair to Bed:  min guard assist  with min assist Goal: Pt Will Ambulate Outcome: Progressing Flowsheets (Taken 09/02/2020 1505) Pt will Ambulate:  50 feet  with minimal assist  with rolling walker   3:05 PM, 09/02/20 Lonell Grandchild, MPT Physical Therapist with [facility] [phone] office [phone] mobile phone

## 2020-09-02 NOTE — Evaluation (Signed)
Physical Therapy Evaluation Patient Details Name: Tony Holt MRN: [id number] DOB: 06-Feb-1926 Today's Date: 09/02/2020   History of Present Illness  Tony Holt  is a 86 y.o. male,with history of cancer, GERD, HTN, BPH, and dysrhythmia, presents to the ER with a chief complaint of flu like symptoms. Patient requests that I obtain history from daughter. Daughter reports that patient had a fall ten days ago. He had an inferior pubic ramus fracture. After that he was feeling sore all over, but did not complain of chest pain. 5 days ago he started to experience right sided chest pain that was worse with deep inspiration. The pain became progressively worse over five days. He reported that he was feeling flu like symptoms. Daughter reports that he never had a fever. She thought that he may have pneumonia, since he has had it before, and brought him in to the ED. Patient reports that he has had associated nausea, palpitations, loose stools. He reports no other symptoms.    Clinical Impression  Patient functioning near baseline for functional mobility and gait, limited mostly due to SpO2 dropping from 93% to 75% while on room air, had to put back on 2 LPM O2 to maintain above 85%, patient demonstrates good return for transferring to/from commode in bathroom, demonstrates slow labored cadence without loss of balance during ambulation and put back to bed after therapy his daughter present in room.  Patient will benefit from continued physical therapy in hospital and recommended venue below to increase strength, balance, endurance for safe ADLs and gait.     Follow Up Recommendations Home health PT;Supervision for mobility/OOB;Supervision - Intermittent    Equipment Recommendations  None recommended by PT    Recommendations for Other Services       Precautions / Restrictions Precautions Precautions: Fall Restrictions Weight Bearing Restrictions: No      Mobility  Bed Mobility Overal bed  mobility: Needs Assistance Bed Mobility: Supine to Sit;Sit to Supine     Supine to sit: Min guard;Min assist Sit to supine: Min guard;Min assist   General bed mobility comments: increased time, labored movement  Transfers Overall transfer level: Needs assistance   Transfers: Sit to/from Stand;Stand Pivot Transfers Sit to Stand: Min guard;Min assist Stand pivot transfers: Min guard;Min assist       General transfer comment: increased time, labored movement  Ambulation/Gait Ambulation/Gait assistance: Min assist;Mod assist Gait Distance (Feet): 20 Feet Assistive device: Rolling walker (2 wheeled) Gait Pattern/deviations: Decreased step length - right;Decreased step length - left;Decreased stride length Gait velocity: decreased   General Gait Details: slow labored unsteady cadence without loss of balance, limited secondary to fatigue and SpO2 dropping from 93% to 75% while on room air  Stairs            Wheelchair Mobility    Modified Rankin (Stroke Patients Only)       Balance Overall balance assessment: Needs assistance Sitting-balance support: Feet supported;No upper extremity supported Sitting balance-Leahy Scale: Fair Sitting balance - Comments: fair/good seated at EOB   Standing balance support: During functional activity;Bilateral upper extremity supported Standing balance-Leahy Scale: Fair Standing balance comment: using RW                             Pertinent Vitals/Pain Pain Assessment: Faces Faces Pain Scale: Hurts whole lot Pain Location: right side of rib cage Pain Descriptors / Indicators: Sore;Grimacing;Guarding Pain Intervention(s): Limited activity within patient's tolerance;Monitored during session;Repositioned  Home Living Family/patient expects to be discharged to:: Private residence Living Arrangements: Children Available Help at Discharge: Family;Available 24 hours/day Type of Home: House Home Access: Ramped  entrance     Home Layout: One level Home Equipment: Cane - single point;Shower seat;Bedside commode;Walker - 2 wheels      Prior Function Level of Independence: Needs assistance   Gait / Transfers Assistance Needed: Household ambulator using RW  ADL's / Homemaking Assistance Needed: assisted by family        Hand Dominance        Extremity/Trunk Assessment   Upper Extremity Assessment Upper Extremity Assessment: Generalized weakness    Lower Extremity Assessment Lower Extremity Assessment: Generalized weakness;RLE deficits/detail;LLE deficits/detail RLE Deficits / Details: grossly 3+/5 RLE Sensation: WNL RLE Coordination: WNL LLE Deficits / Details: grossly 4+/5 LLE Sensation: WNL LLE Coordination: WNL    Cervical / Trunk Assessment Cervical / Trunk Assessment: Normal  Communication   Communication: HOH  Cognition Arousal/Alertness: Awake/alert Behavior During Therapy: Impulsive;WFL for tasks assessed/performed Overall Cognitive Status: Within Functional Limits for tasks assessed                                        General Comments      Exercises     Assessment/Plan    PT Assessment Patient needs continued PT services  PT Problem List Decreased strength;Decreased activity tolerance;Decreased balance;Decreased mobility;Cardiopulmonary status limiting activity       PT Treatment Interventions Balance training;Gait training;Stair training;Functional mobility training;Therapeutic activities;Therapeutic exercise;Patient/family education    PT Goals (Current goals can be found in the Care Plan section)  Acute Rehab PT Goals Patient Stated Goal: return home with family to assist PT Goal Formulation: With patient/family Time For Goal Achievement: 09/09/20 Potential to Achieve Goals: Good    Frequency Min 3X/week   Barriers to discharge        Co-evaluation               AM-PAC PT "6 Clicks" Mobility  Outcome Measure Help  needed turning from your back to your side while in a flat bed without using bedrails?: None Help needed moving from lying on your back to sitting on the side of a flat bed without using bedrails?: A Little Help needed moving to and from a bed to a chair (including a wheelchair)?: A Little Help needed standing up from a chair using your arms (e.g., wheelchair or bedside chair)?: A Little Help needed to walk in hospital room?: A Lot Help needed climbing 3-5 steps with a railing? : A Lot 6 Click Score: 17    End of Session Equipment Utilized During Treatment: Oxygen Activity Tolerance: Patient tolerated treatment well;Patient limited by fatigue Patient left: in bed;with call bell/phone within reach;with family/visitor present Nurse Communication: Mobility status PT Visit Diagnosis: Unsteadiness on feet (R26.81);Other abnormalities of gait and mobility (R26.89);Muscle weakness (generalized) (M62.81)    Time: 1202-1230 PT Time Calculation (min) (ACUTE ONLY): 28 min   Charges:   PT Evaluation $PT Eval Moderate Complexity: 1 Mod PT Treatments $Therapeutic Activity: 23-37 mins        3:03 PM, 09/02/20 Lonell Grandchild, MPT Physical Therapist with [facility] 336 [phone] office [phone] mobile phone

## 2020-09-02 NOTE — Progress Notes (Signed)
[place] OF CARE NOTE   09/02/2020 3:04 PM  Tony Holt was seen and examined.  The H&P by the admitting provider, orders, imaging was reviewed.  Please see new orders.  Will continue to follow.   Impression and plan  1. Acute respiratory failure with hypoxia - secondary to CAP - continue IV antibiotics, supplemental oxygen and supportive care.  DC telemetry, transfer to med surg.   2. AKI - prerenal - treating with IV fluids, follow renal function panel.   Vitals:   09/02/20 1331 09/02/20 1431  BP: 124/71 133/86  Pulse: 93 95  Resp: 12 20  Temp: 97.9 F (36.6 C) (!) 97.5 F (36.4 C)  SpO2: 93% 90%    Results for orders placed or performed during the hospital encounter of 09/02/20  SARS Coronavirus 2 by RT PCR (hospital order, performed in [place] hospital lab) Nasopharyngeal Nasopharyngeal Swab   Specimen: Nasopharyngeal Swab  Result Value Ref Range   SARS Coronavirus 2 NEGATIVE NEGATIVE  CBC with Differential  Result Value Ref Range   WBC 8.0 4.0 - 10.5 K/uL   RBC 3.19 (L) 4.22 - 5.81 MIL/uL   Hemoglobin 10.1 (L) 13.0 - 17.0 g/dL   HCT 33.2 (L) 39 - 52 %   MCV 104.1 (H) 80.0 - 100.0 fL   MCH 31.7 26.0 - 34.0 pg   MCHC 30.4 30.0 - 36.0 g/dL   RDW 13.7 11.5 - 15.5 %   Platelets 212 150 - 400 K/uL   nRBC 0.0 0.0 - 0.2 %   Neutrophils Relative % 84 %   Neutro Abs 6.7 1.7 - 7.7 K/uL   Lymphocytes Relative 4 %   Lymphs Abs 0.3 (L) 0.7 - 4.0 K/uL   Monocytes Relative 10 %   Monocytes Absolute 0.8 0 - 1 K/uL   Eosinophils Relative 0 %   Eosinophils Absolute 0.0 0 - 0 K/uL   Basophils Relative 0 %   Basophils Absolute 0.0 0 - 0 K/uL   Immature Granulocytes 2 %   Abs Immature Granulocytes 0.12 (H) 0.00 - 0.07 K/uL  Basic metabolic panel  Result Value Ref Range   Sodium 137 135 - 145 mmol/L   Potassium 4.8 3.5 - 5.1 mmol/L   Chloride 109 98 - 111 mmol/L   CO2 16 (L) 22 - 32 mmol/L   Glucose, Bld 108 (H) 70 - 99 mg/dL   BUN 84 (H) 8 - 23 mg/dL   Creatinine,  Ser 2.81 (H) 0.61 - 1.24 mg/dL   Calcium 9.5 8.9 - 10.3 mg/dL   GFR calc non Af Amer 19 (L) >60 mL/min   GFR calc Af Amer 22 (L) >60 mL/min   Anion gap 12 5 - 15  Comprehensive metabolic panel  Result Value Ref Range   Sodium 139 135 - 145 mmol/L   Potassium 4.5 3.5 - 5.1 mmol/L   Chloride 112 (H) 98 - 111 mmol/L   CO2 18 (L) 22 - 32 mmol/L   Glucose, Bld 97 70 - 99 mg/dL   BUN 81 (H) 8 - 23 mg/dL   Creatinine, Ser 2.46 (H) 0.61 - 1.24 mg/dL   Calcium 9.1 8.9 - 10.3 mg/dL   Total Protein 6.4 (L) 6.5 - 8.1 g/dL   Albumin 3.2 (L) 3.5 - 5.0 g/dL   AST 18 15 - 41 U/L   ALT 29 0 - 44 U/L   Alkaline Phosphatase 81 38 - 126 U/L   Total Bilirubin 0.7 0.3 - 1.2 mg/dL  GFR calc non Af Amer 22 (L) >60 mL/min   GFR calc Af Amer 26 (L) >60 mL/min   Anion gap 9 5 - 15  Magnesium  Result Value Ref Range   Magnesium 2.0 1.7 - 2.4 mg/dL  CBC WITH DIFFERENTIAL  Result Value Ref Range   WBC 6.9 4.0 - 10.5 K/uL   RBC 2.95 (L) 4.22 - 5.81 MIL/uL   Hemoglobin 9.2 (L) 13.0 - 17.0 g/dL   HCT 30.8 (L) 39 - 52 %   MCV 104.4 (H) 80.0 - 100.0 fL   MCH 31.2 26.0 - 34.0 pg   MCHC 29.9 (L) 30.0 - 36.0 g/dL   RDW 13.8 11.5 - 15.5 %   Platelets 229 150 - 400 K/uL   nRBC 0.0 0.0 - 0.2 %   Neutrophils Relative % 78 %   Neutro Abs 5.4 1.7 - 7.7 K/uL   Lymphocytes Relative 7 %   Lymphs Abs 0.5 (L) 0.7 - 4.0 K/uL   Monocytes Relative 12 %   Monocytes Absolute 0.8 0 - 1 K/uL   Eosinophils Relative 1 %   Eosinophils Absolute 0.1 0 - 0 K/uL   Basophils Relative 0 %   Basophils Absolute 0.0 0 - 0 K/uL   Immature Granulocytes 2 %   Abs Immature Granulocytes 0.11 (H) 0.00 - 0.07 K/uL   C. Wynetta Emery, MD [facility]   09/02/2020 12:04 AM How to contact the [facility] Attending or Consulting provider [place] or covering provider during after hours [place], for this patient?  1. Check the care team in [facility] and look for a) attending/consulting [facility] provider listed and b) the [facility] team listed 2. Log into  [email or web address] and use [place]'s universal password to access. If you do not have the password, please contact the hospital operator. 3. Locate the [facility] provider you are looking for under [facility] and page to a number that you can be directly reached. 4. If you still have difficulty reaching the provider, please page the [facility] (Director on Call) for the Hospitalists listed on amion for assistance.

## 2020-09-02 NOTE — H&P (Signed)
[facility] H&P    Patient Demographics:    Tony Holt, is a 86 y.o. male  MRN: [id number]  DOB - October 06, 1926  Admit Date - 09/02/2020  Referring MD/NP/PA: Dina Rich  Outpatient Primary MD for the patient is [name], MD  Patient coming from: Home  Chief complaint- flu like symptoms   HPI:    Tony Holt  is a 86 y.o. male,with history of cancer, GERD, HTN, BPH, and dysrhythmia, presents to the ER with a chief complaint of flu like symptoms. Patient requests that I obtain history from daughter. Daughter reports that patient had a fall ten days ago. He had an inferior pubic ramus fracture. After that he was feeling sore all over, but did not complain of chest pain. 5 days ago he started to experience right sided chest pain that was worse with deep inspiration. The pain became progressively worse over five days. He reported that he was feeling flu like symptoms. Daughter reports that he never had a fever. She thought that he may have pneumonia, since he has had it before, and brought him in to the ED. Patient reports that he has had associated nausea, palpitations, loose stools. He reports no other symptoms.   IN the ED T 97.9, HR 89, R 15, BP 102/72 down to 89% - and reported lower with ambulation WBC 8.0 BUN/Cr 84/2.81 Covid negative CT chest shows right upper lobe pneumonia XR ribs = no acute displaced rib fx. New airspace opacities in the right upper lung indicating developing pneumonia. Air fluid levels in abdomen could indicate SBO     Review of systems:    In addition to the HPI above,  Review of Systems  Constitutional: Positive for chills and malaise/fatigue. Negative for fever.  HENT: Positive for hearing loss. Negative for ear discharge, ear pain, sinus pain, sore throat and tinnitus.   Eyes: Positive for blurred vision. Negative for double vision and photophobia.  Respiratory: Positive for  cough and shortness of breath. Negative for hemoptysis, sputum production and wheezing.   Cardiovascular: Positive for chest pain (chest wall pain) and palpitations. Negative for orthopnea and leg swelling.  Gastrointestinal: Positive for nausea. Negative for abdominal pain, constipation, diarrhea and vomiting.  Genitourinary: Negative for dysuria, frequency and urgency.  Musculoskeletal: Positive for back pain, falls, joint pain and myalgias.  Skin: Negative for rash.  Neurological: Positive for weakness.    All other systems reviewed and are negative.    Past History of the following :    Past Medical History:  Diagnosis Date  . BPH (benign prostatic hyperplasia)   . Colon cancer ([place]) 1999  . Dysrhythmia    new onset A Fib  . Essential hypertension   . GERD (gastroesophageal reflux disease)   . Hard of hearing   . Prostate cancer [facility])       Past Surgical History:  Procedure Laterality Date  . BIOPSY  12/28/2019   Procedure: BIOPSY;  Surgeon: Daneil Dolin, MD;  Location: [facility] ENDO SUITE;  Service: Endoscopy;;  gastric, esophagus,cecal  . CATARACT EXTRACTION  [facility]  01/18/2013   Procedure: CATARACT EXTRACTION PHACO AND INTRAOCULAR LENS PLACEMENT (IOC);  Surgeon: Tonny Branch, MD;  Location: [facility] ORS;  Service: Ophthalmology;  Laterality: Left;  CDE 17.11  . CATARACT EXTRACTION W/PHACO Right 02/05/2013   Procedure: CATARACT EXTRACTION PHACO AND INTRAOCULAR LENS PLACEMENT (IOC);  Surgeon: Tonny Branch, MD;  Location: [facility] ORS;  Service: Ophthalmology;  Laterality: Right;  CDE:19.49  . COLON SURGERY  1999   Sigmoid colon cancer, segmental resection by Drs. Smith/[place]  . COLONOSCOPY  2002   Multiple adenomatous colon polyps  . COLONOSCOPY N/A 12/28/2019   Procedure: COLONOSCOPY;  Surgeon: Daneil Dolin, MD;  Location: [facility] END[facility] SUITE;  Service: Endoscopy;  Laterality: N/A;  . ESOPHAGOGASTRODUODENOSCOPY  03/25/2012   Dr. Gala Romney: Severe ulcerative/erosive reflux esophagitis.  Small  hiatal hernia.  Abnormal gastric and duodenal mucosa, biopsy showed reactive changes and minimal chronic inflammation but no H. pylori.  . ESOPHAGOGASTRODUODENOSCOPY N/A 12/28/2019   Procedure: ESOPHAGOGASTRODUODENOSCOPY (EGD);  Surgeon: Daneil Dolin, MD;  Location: [facility];  Service: Endoscopy;  Laterality: N/A;  . HERNIA REPAIR  02/2011   Right inguinal hernia repair with mesh, Dr. Geroge Baseman  . HERNIA REPAIR     three total  . PARTIAL COLECTOMY Right 02/04/2020   Procedure: RIGHT HEMI-COLECTOMY;  Surgeon: Virl Cagey, MD;  Location: [facility] ORS;  Service: General;  Laterality: Right;  . POLYPECTOMY  12/28/2019   Procedure: POLYPECTOMY;  Surgeon: Daneil Dolin, MD;  Location: [facility];  Service: Endoscopy;;  rectal       Social History:      Social History   Tobacco Use  . Smoking status: Former Smoker    Packs/day: 1.00    Years: 50.00    Pack years: 50.00    Types: Cigarettes    Quit date: 12/13/1989    Years since quitting: 30.7  . Smokeless tobacco: Current User    Types: Snuff  Substance Use Topics  . Alcohol use: No    Alcohol/week: 0.0 standard drinks    Comment: previous heavy drinker at age [age]       Family History :     Family History  Problem Relation Age of Onset  . Heart attack Father        Deceased, age 74s  . Diabetes Brother   . Colon cancer Neg Hx   . Liver disease Neg Hx   . Breast cancer Neg Hx   . Prostate cancer Neg Hx   . Pancreatic cancer Neg Hx       Home Medications:   Prior to Admission medications   Medication Sig Start Date End Date Taking? Authorizing Provider  diphenhydramine-acetaminophen (TYLENOL PM) 25-500 MG TABS tablet Take 2 tablets by mouth at bedtime.     [provider]  docusate sodium (COLACE) 100 MG capsule Take 1 capsule (100 mg total) by mouth 2 (two) times daily as needed for mild constipation. 02/07/20   Virl Cagey, MD  Ensure (ENSURE) Take 237 mLs by mouth 3 (three) times daily  between meals.    [provider]  HYDROcodone-acetaminophen (NORCO/VICODIN) 5-325 MG tablet Take 1 tablet by mouth every 12 (twelve) hours as needed for moderate pain.  05/13/16   [provider]  omeprazole (PRILOSEC) 20 MG capsule Take 20 mg by mouth 2 (two) times daily. 05/09/16   [provider]  Pediatric Multiple Vitamins (FLINTSTONES MULTIVITAMIN PO) Take 1 tablet by mouth daily.     [provider]  Probiotic Product (CULTURELLE PROBIOTICS PO) Take by mouth daily.    [provider]  Tamsulosin HCl (FLOMAX) 0.4 MG CAPS Take 1 capsule (0.4 mg total) by mouth daily. 03/27/12   Sinda Du, MD     Allergies:    No Known Allergies   Physical Exam:   Vitals  Blood pressure 102/72, pulse 98, temperature 97.9 F (36.6 C), temperature source Oral, resp. rate 15, height 5' 8.5" (1.74 m), weight 59.9 kg, SpO2 95 %.  1.  General: Resting comfortably in bed  2. Psychiatric: Cooperative and pleasant Mood and behavior are normal for situation  3. Neurologic: CN II-XII grossly intact No focal deficits on limited exam Moves all 4 extremities voluntarily Generalized weakness  4. HEENMT:  Head is atraumatic, normocephalic Very hard of hearing Sclera are clear Neck is cachectic Trachea is midline  5. Respiratory : Lungs with diminished breath sounds in the lower lung fields BL No wheezes, crackles, or rhonchi  6. Cardiovascular : HRRR No murmurs rubs or gallops No peripheral edema  7. Gastrointestinal:  Abdomen is soft, non distended and non tender to palpation  8. Skin:  No acute lesions on limited skin exam  9.Musculoskeletal:  No acute deformity Tenderness over right hip/proximal femur    Data Review:    CBC Recent Labs  Lab 09/02/20 0200  WBC 8.0  HGB 10.1*  HCT 33.2*  PLT 212  MCV 104.1*  MCH 31.7  MCHC 30.4  RDW 13.7  LYMPHSABS 0.3*  MONOABS 0.8  EOSABS 0.0  BASOSABS 0.0    ------------------------------------------------------------------------------------------------------------------  Results for orders placed or performed during the hospital encounter of 09/02/20 (from the past 48 hour(s))  SARS Coronavirus 2 by RT PCR (hospital order, performed in [facility] hospital lab) Nasopharyngeal Nasopharyngeal Swab     Status: None   Collection Time: 09/02/20  1:00 AM   Specimen: Nasopharyngeal Swab  Result Value Ref Range   SARS Coronavirus 2 NEGATIVE NEGATIVE    Comment: (NOTE) SARS-CoV-2 target nucleic acids are NOT DETECTED.  The SARS-CoV-2 RNA is generally detectable in upper and lower respiratory specimens during the acute phase of infection. The lowest concentration of SARS-CoV-2 viral copies this assay can detect is 250 copies / mL. A negative result does not preclude SARS-CoV-2 infection and should not be used as the sole basis for treatment or other patient management decisions.  A negative result may occur with improper specimen collection / handling, submission of specimen other than nasopharyngeal swab, presence of viral mutation(s) within the areas targeted by this assay, and inadequate number of viral copies (<250 copies / mL). A negative result must be combined with clinical observations, patient history, and epidemiological information.  Fact Sheet for Patients:   StrictlyIdeas.no  Fact Sheet for Healthcare Providers: BankingDealers.co.za  This test is not yet approved or  cleared by the [place] FDA and has been authorized for detection and/or diagnosis of SARS-CoV-2 by FDA under an Emergency Use Authorization (EUA).  This EUA will remain in effect (meaning this test can be used) for the duration of the COVID-19 declaration under Section 564(b)(1) of the Act, 21 U.S.C. section 360bbb-3(b)(1), unless the authorization is terminated or revoked sooner.  Performed at [facility], [address] [place], [place] [address]   CBC with Differential     Status: Abnormal   Collection Time: 09/02/20  2:00 AM  Result Value Ref Range   WBC 8.0 4.0 - 10.5 K/uL   RBC 3.19 (L) 4.22 - 5.81  MIL/uL   Hemoglobin 10.1 (L) 13.0 - 17.0 g/dL   HCT 33.2 (L) 39 - 52 %   MCV 104.1 (H) 80.0 - 100.0 fL   MCH 31.7 26.0 - 34.0 pg   MCHC 30.4 30.0 - 36.0 g/dL   RDW 13.7 11.5 - 15.5 %   Platelets 212 150 - 400 K/uL   nRBC 0.0 0.0 - 0.2 %   Neutrophils Relative % 84 %   Neutro Abs 6.7 1.7 - 7.7 K/uL   Lymphocytes Relative 4 %   Lymphs Abs 0.3 (L) 0.7 - 4.0 K/uL   Monocytes Relative 10 %   Monocytes Absolute 0.8 0 - 1 K/uL   Eosinophils Relative 0 %   Eosinophils Absolute 0.0 0 - 0 K/uL   Basophils Relative 0 %   Basophils Absolute 0.0 0 - 0 K/uL   Immature Granulocytes 2 %   Abs Immature Granulocytes 0.12 (H) 0.00 - 0.07 K/uL    Comment: Performed at [facility], [address], [place], [place] [address]  Basic metabolic panel     Status: Abnormal   Collection Time: 09/02/20  2:00 AM  Result Value Ref Range   Sodium 137 135 - 145 mmol/L   Potassium 4.8 3.5 - 5.1 mmol/L   Chloride 109 98 - 111 mmol/L   CO2 16 (L) 22 - 32 mmol/L   Glucose, Bld 108 (H) 70 - 99 mg/dL    Comment: Glucose reference range applies only to samples taken after fasting for at least 8 hours.   BUN 84 (H) 8 - 23 mg/dL   Creatinine, Ser 2.81 (H) 0.61 - 1.24 mg/dL   Calcium 9.5 8.9 - 10.3 mg/dL   GFR calc non Af Amer 19 (L) >60 mL/min   GFR calc Af Amer 22 (L) >60 mL/min   Anion gap 12 5 - 15    Comment: Performed at [facility], [address], [place], [place] [address]    Chemistries  Recent Labs  Lab 09/02/20 0200  NA 137  K 4.8  CL 109  CO2 16*  GLUCOSE 108*  BUN 84*  CREATININE 2.81*  CALCIUM 9.5    ------------------------------------------------------------------------------------------------------------------  ------------------------------------------------------------------------------------------------------------------ GFR: Estimated Creatinine Clearance: 14.5 mL/min (A) (by C-G formula based on SCr of 2.81 mg/dL (H)). Liver Function Tests: No results for input(s): AST, ALT, ALKPHOS, BILITOT, PROT, ALBUMIN in the last 168 hours. No results for input(s): LIPASE, AMYLASE in the last 168 hours. No results for input(s): AMMONIA in the last 168 hours. Coagulation Profile: No results for input(s): INR, PROTIME in the last 168 hours. Cardiac Enzymes: No results for input(s): CKTOTAL, CKMB, CKMBINDEX, TROPONINI in the last 168 hours. BNP (last 3 results) No results for input(s): PROBNP in the last 8760 hours. HbA1C: No results for input(s): HGBA1C in the last 72 hours. CBG: No results for input(s): GLUCAP in the last 168 hours. Lipid Profile: No results for input(s): CHOL, HDL, LDLCALC, TRIG, CHOLHDL, LDLDIRECT in the last 72 hours. Thyroid Function Tests: No results for input(s): TSH, T4TOTAL, FREET4, T3FREE, THYROIDAB in the last 72 hours. Anemia Panel: No results for input(s): VITAMINB12, FOLATE, FERRITIN, TIBC, IRON, RETICCTPCT in the last 72 hours.  --------------------------------------------------------------------------------------------------------------- Urine analysis:    Component Value Date/Time   COLORURINE STRAW (A) 02/22/2020 1929   APPEARANCEUR CLEAR 02/22/2020 1929   LABSPEC 1.008 02/22/2020 1929   PHURINE 6.0 02/22/2020 [place] NEGATIVE 02/22/2020 [place] NEGATIVE 02/22/2020 1929   BILIRUBINUR neg 04/07/2020 1426  KETONESUR NEGATIVE 02/22/2020 1929   PROTEINUR Positive (A) 04/07/2020 1426   PROTEINUR NEGATIVE 02/22/2020 1929   UROBILINOGEN negative (A) 04/07/2020 1426   UROBILINOGEN 0.2 03/23/2012 2041   NITRITE neg 04/07/2020 1426    NITRITE NEGATIVE 02/22/2020 1929   LEUKOCYTESUR Negative 04/07/2020 1426   LEUKOCYTESUR NEGATIVE 02/22/2020 1929      Imaging Results:    DG Ribs Unilateral W/Chest Right  Result Date: 09/01/2020 CLINICAL DATA:  Pain status post fall EXAM: RIGHT RIBS AND CHEST - 3+ VIEW COMPARISON:  June 21, 2019 FINDINGS: There are new airspace opacities in the right upper lung zone. There is no pneumothorax. No large pleural effusion. There is stable blunting of the costophrenic angles bilaterally. There is a nodular density in the left lower lung zone. The heart size is stable. Aortic calcifications are noted. Again noted is a compression fracture of the L1 vertebral body. There are few air-fluid levels in the abdomen, especially on the left. The liver shadow appears enlarged. IMPRESSION: 1. No acute displaced rib fracture. 2. New airspace opacities in the right upper lung zone may represent developing pneumonia or aspiration. 3. Nodular density in the left lower lung zone. A 4-6 week follow-up two-view chest x-ray is recommended to confirm resolution of this finding. 4. Air-fluid levels in the left mid abdomen may represent an ileus or developing small bowel obstruction. Electronically Signed   By: Constance Holster M.D.   On: 09/01/2020 17:50   CT Chest Wo Contrast  Result Date: 09/02/2020 CLINICAL DATA:  Chest pain, dyspnea EXAM: CT CHEST WITHOUT CONTRAST TECHNIQUE: Multidetector CT imaging of the chest was performed following the standard protocol without IV contrast. COMPARISON:  01/17/2020 FINDINGS: Cardiovascular: There is mild global cardiomegaly again identified with biatrial enlargement. Extensive coronary artery calcification. Trace pericardial effusion is stable since prior examination. Central pulmonary arteries are enlarged in keeping with changes of pulmonary arterial hypertension. Moderate atherosclerotic calcification is seen within the thoracic aorta without evidence of aneurysm. Mediastinum/Nodes:  No pathologic adenopathy. Small hiatal hernia. Thyroid unremarkable. Lungs/Pleura: There has developed focal consolidation within the posterior segment of the right upper lobe. Additionally, there has developed diffuse bronchial wall thickening, most severe within the right upper lobe, in keeping with airway inflammation. Together, the findings are in compatible with changes of bronchopneumonia. No pneumothorax or pleural effusion. Mild bibasilar atelectasis. No central obstructing mass. Upper Abdomen: Partially calcified cyst within the interpolar region of the left kidney is partially visualized on this examination but appears unchanged. No acute abnormality. Musculoskeletal: Osseous structures are age-appropriate. IMPRESSION: Changes of bronchopneumonia with focal consolidation within the right upper lobe. Extensive coronary artery calcification. Pulmonary artery hypertension. Aortic Atherosclerosis (ICD10-I70.0). Electronically Signed   By: Fidela Salisbury MD   On: 09/02/2020 04:19     Assessment & Plan:    Active Problems:   Respiratory failure with hypoxia (HCC)   1. resp failure with hypoxia 1. Saturations down to 80s 2. 2/2 CAP 3. Continue O2 supplementation, wean off as tolerated 4. Monitor on tele 2. CAP 1. Right upper lobe pneumonia 2. Started on CAP antibiotics 3. Covid negative 4. Continue to monitor 3. AKI 1. Baseline Cr 1.3 2. Today 2.8 3. 2/2 poor PO intake 4. 527ml bolus in ED 5. Continue IVFs 4.    DVT Prophylaxis-   heparin - SCDs   AM Labs Ordered, also please review Full Orders  Family Communication: Admission, patients condition and plan of care including tests being ordered have been discussed with the patient  and daughter who indicate understanding and agree with the plan and Code Status.  Admission status: Inpatient :The appropriate admission status for this patient is INPATIENT. Inpatient status is judged to be reasonable and necessary in order to provide  the required intensity of service to ensure the patient's safety. The patient's presenting symptoms, physical exam findings, and initial radiographic and laboratory data in the context of their chronic comorbidities is felt to place them at high risk for further clinical deterioration. Furthermore, it is not anticipated that the patient will be medically stable for discharge from the hospital within 2 midnights of admission. The following factors support the admission status of inpatient.     The patient's presenting symptoms include Dyspnea, rib pain The worrisome physical exam findings include hypoxia down to the 80s The initial radiographic and laboratory data are worrisome because of Changes of bronchopneumonia with focal consolidation within the right upper lobe. The chronic co-morbidities include GERD, HTN, BPH       * I certify that at the point of admission it is my clinical judgment that the patient will require inpatient hospital care spanning beyond 2 midnights from the point of admission due to high intensity of service, high risk for further deterioration and high frequency of surveillance required.*  Time spent in minutes : [place]

## 2020-09-03 DIAGNOSIS — N1831 Chronic kidney disease, stage 3a: Secondary | ICD-10-CM

## 2020-09-03 DIAGNOSIS — J181 Lobar pneumonia, unspecified organism: Secondary | ICD-10-CM

## 2020-09-03 DIAGNOSIS — N179 Acute kidney failure, unspecified: Secondary | ICD-10-CM

## 2020-09-03 DIAGNOSIS — J9601 Acute respiratory failure with hypoxia: Secondary | ICD-10-CM

## 2020-09-03 LAB — BASIC METABOLIC PANEL
Anion gap: 12 (ref 5–15)
BUN: 60 mg/dL — ABNORMAL HIGH (ref 8–23)
CO2: 15 mmol/L — ABNORMAL LOW (ref 22–32)
Calcium: 9.2 mg/dL (ref 8.9–10.3)
Chloride: 113 mmol/L — ABNORMAL HIGH (ref 98–111)
Creatinine, Ser: 1.72 mg/dL — ABNORMAL HIGH (ref 0.61–1.24)
GFR calc Af Amer: 39 mL/min — ABNORMAL LOW (ref >60)
GFR calc non Af Amer: 34 mL/min — ABNORMAL LOW (ref >60)
Glucose, Bld: 82 mg/dL (ref 70–99)
Potassium: 4.3 mmol/L (ref 3.5–5.1)
Sodium: 140 mmol/L (ref 135–145)

## 2020-09-03 LAB — CBC
HCT: 29.6 % — ABNORMAL LOW (ref 39.0–52.0)
Hemoglobin: 8.9 g/dL — ABNORMAL LOW (ref 13.0–17.0)
MCH: 31.3 pg (ref 26.0–34.0)
MCHC: 30.1 g/dL (ref 30.0–36.0)
MCV: 104.2 fL — ABNORMAL HIGH (ref 80.0–100.0)
Platelets: 212 10*3/uL (ref 150–400)
RBC: 2.84 MIL/uL — ABNORMAL LOW (ref 4.22–5.81)
RDW: 13.7 % (ref 11.5–15.5)
WBC: 5.4 10*3/uL (ref 4.0–10.5)
nRBC: 0 % (ref 0.0–0.2)

## 2020-09-03 LAB — D-DIMER, QUANTITATIVE: D-Dimer, Quant: 2.39 ug/mL-FEU — ABNORMAL HIGH (ref 0.00–0.50)

## 2020-09-03 MED ORDER — ALUM & MAG HYDROXIDE-SIMETH 200-200-20 MG/5ML PO SUSP: 30.0000 mL | ORAL | Status: DC | PRN

## 2020-09-03 NOTE — Plan of Care (Signed)

## 2020-09-03 NOTE — Progress Notes (Signed)
PROGRESS NOTE  Tony Holt:[id number] DOB: 02/21/1926 DOA: 09/02/2020 PCP: [name], MD  Brief History:  86 y.o. male,with history of cancer, GERD, HTN, BPH, and dysrhythmia, presents to the ER with a chief complaint of flu like symptoms. Daughter reports that patient had a fall ten days ago. He had an inferior pubic ramus fracture on 08/22/20. After that he was feeling sore all over, but did not complain of chest pain. 5 days ago he started to experience right sided chest pain that was worse with deep inspiration. The pain became progressively worse over five days. He reported that he was feeling flu like symptoms. Daughter reports that he never had a fever. She thought that he may have pneumonia, since he has had it before, and brought him in to the ED. Patient reports that he has had associated nausea, palpitations, loose stools. He reports no other symptoms.   IN the ED T 97.9, HR 89, R 15, BP 102/72 down to 89% - and reported lower with ambulation WBC 8.0 BUN/Cr 84/2.81 Covid negative CT chest shows right upper lobe consolidation with diffuse bronchial wall thickening   Assessment/Plan: Acute respiratory failure with hypoxia -secondary to pneumonia -stable on 2L -wean for saturation >92%  Lobar pneumonia -09/02/20 ct chest as discussed above -continue ceftriaxone and azithro -covid negative  Acute on chronic renal failure--CKD3a -baseline creatinine 1.3-1.5 -serum creatinine peaked 2.81 -improved with IVF  Prostate cancer -follows Dr. Kandis Nab Lupron -also receiving XRT in [place] -continue tamulosin  GERD -continue PPI  Deconditioning -PT-->HHPT  Chronic Atrial fibrillation -Currently rate controlled -Not on anticoagulation due to chronic anemia  Cecal adenocarcinoma -s/p R-hemicolectomy with re-anastomosis 02/2020   Status is: Inpatient  Remains inpatient appropriate because:IV treatments appropriate due to intensity of illness or  inability to take PO   Dispo: The patient is from: Home              Anticipated d/c is to: Home              Anticipated d/c date is: 1 day              Patient currently is not medically stable to d/c.        Family Communication:   Daughter updated 9/22 at bedside  Consultants:  none  Code Status:  FULL   DVT Prophylaxis:  [place] Heparin /    Procedures: As Listed in Progress Note Above  Antibiotics: Ceftriaxone 9/21>> azithro 9/21>>     Subjective: Patient is starting to feel better but remains weak.  Oral intake remains substandard.  Denies f/c, cp, n/v/d, abd pain  Objective: Vitals:   09/03/20 0500 09/03/20 0623 09/03/20 1402 09/03/20 1519  BP:  110/68 98/62   Pulse:  84 86   Resp:  19 18   Temp:  98.7 F (37.1 C) 97.8 F (36.6 C)   TempSrc:   Oral   SpO2:  97% (!) 88% 98%  Weight: 60.5 kg     Height:        Intake/Output Summary (Last 24 hours) at 09/03/2020 1802 Last data filed at 09/03/2020 0900 Gross per 24 hour  Intake 350 ml  Output 850 ml  Net -500 ml   Weight change: 0.625 kg Exam:   General:  Pt is alert, follows commands appropriately, not in acute distress  HEENT: No icterus, No thrush, No neck mass, [place]/AT  Cardiovascular: RRR, S1/S2, no rubs, no gallops  Respiratory: bibasilar rales, no wheeze  Abdomen: Soft/+BS, non tender, non distended, no guarding  Extremities: No edema, No lymphangitis, No petechiae, No rashes, no synovitis   Data Reviewed: I have personally reviewed following labs and imaging studies Basic Metabolic Panel: Recent Labs  Lab 09/02/20 0200 09/02/20 0618 09/03/20 0552  NA 137 139 140  K 4.8 4.5 4.3  CL 109 112* 113*  CO2 16* 18* 15*  GLUCOSE 108* 97 82  BUN 84* 81* 60*  CREATININE 2.81* 2.46* 1.72*  CALCIUM 9.5 9.1 9.2  MG  --  2.0  --    Liver Function Tests: Recent Labs  Lab 09/02/20 0618  AST 18  ALT 29  ALKPHOS 81  BILITOT 0.7  PROT 6.4*  ALBUMIN 3.2*   No results for input(s):  LIPASE, AMYLASE in the last 168 hours. No results for input(s): AMMONIA in the last 168 hours. Coagulation Profile: No results for input(s): INR, PROTIME in the last 168 hours. CBC: Recent Labs  Lab 09/02/20 0200 09/02/20 0618 09/03/20 0552  WBC 8.0 6.9 5.4  NEUTROABS 6.7 5.4  --   HGB 10.1* 9.2* 8.9*  HCT 33.2* 30.8* 29.6*  MCV 104.1* 104.4* 104.2*  PLT 212 229 212   Cardiac Enzymes: No results for input(s): CKTOTAL, CKMB, CKMBINDEX, TROPONINI in the last 168 hours. BNP: Invalid input(s): POCBNP CBG: No results for input(s): GLUCAP in the last 168 hours. HbA1C: No results for input(s): HGBA1C in the last 72 hours. Urine analysis:    Component Value Date/Time   COLORURINE STRAW (A) 02/22/2020 1929   APPEARANCEUR CLEAR 02/22/2020 1929   LABSPEC 1.008 02/22/2020 1929   PHURINE 6.0 02/22/2020 [place] NEGATIVE 02/22/2020 [place] NEGATIVE 02/22/2020 1929   BILIRUBINUR neg 04/07/2020 [place] 02/22/2020 1929   PROTEINUR Positive (A) 04/07/2020 1426   PROTEINUR NEGATIVE 02/22/2020 1929   UROBILINOGEN negative (A) 04/07/2020 1426   UROBILINOGEN 0.2 03/23/2012 2041   NITRITE neg 04/07/2020 1426   NITRITE NEGATIVE 02/22/2020 1929   LEUKOCYTESUR Negative 04/07/2020 1426   LEUKOCYTESUR NEGATIVE 02/22/2020 1929   Sepsis Labs: @LABRCNTIP (procalcitonin:4,lacticidven:4) ) Recent Results (from the past 240 hour(s))  SARS Coronavirus 2 by RT PCR (hospital order, performed in [place] hospital lab) Nasopharyngeal Nasopharyngeal Swab     Status: None   Collection Time: 09/02/20  1:00 AM   Specimen: Nasopharyngeal Swab  Result Value Ref Range Status   SARS Coronavirus 2 NEGATIVE NEGATIVE Final    Comment: (NOTE) SARS-CoV-2 target nucleic acids are NOT DETECTED.  The SARS-CoV-2 RNA is generally detectable in upper and lower respiratory specimens during the acute phase of infection. The lowest concentration of SARS-CoV-2 viral copies this assay can  detect is 250 copies / mL. A negative result does not preclude SARS-CoV-2 infection and should not be used as the sole basis for treatment or other patient management decisions.  A negative result may occur with improper specimen collection / handling, submission of specimen other than nasopharyngeal swab, presence of viral mutation(s) within the areas targeted by this assay, and inadequate number of viral copies (<250 copies / mL). A negative result must be combined with clinical observations, patient history, and epidemiological information.  Fact Sheet for Patients:   StrictlyIdeas.no  Fact Sheet for Healthcare Providers: BankingDealers.co.za  This test is not yet approved or  cleared by the [place] FDA and has been authorized for detection and/or diagnosis of SARS-CoV-2 by FDA under an Emergency Use Authorization (EUA).  This  EUA will remain in effect (meaning this test can be used) for the duration of the COVID-19 declaration under Section 564(b)(1) of the Act, 21 U.S.C. section 360bbb-3(b)(1), unless the authorization is terminated or revoked sooner.  Performed at [facility], [address], [place], [place] [address]      Scheduled Meds: . diphenhydrAMINE  50 mg Oral QHS   And  . acetaminophen  1,000 mg Oral QHS  . heparin  5,000 Units Subcutaneous Q8H  . influenza vaccine adjuvanted  0.5 mL Intramuscular Tomorrow-1000  . pantoprazole  40 mg Oral Daily  . tamsulosin  0.4 mg Oral Daily   Continuous Infusions: . sodium chloride 60 mL/hr at 09/02/20 1816  . azithromycin 500 mg (09/03/20 0251)  . cefTRIAXone (ROCEPHIN)  IV 1 g (09/02/20 2149)    Procedures/Studies: DG Ribs Unilateral W/Chest Right  Result Date: 09/01/2020 CLINICAL DATA:  Pain status post fall EXAM: RIGHT RIBS AND CHEST - 3+ VIEW COMPARISON:  June 21, 2019 FINDINGS: There are new airspace opacities in the right upper lung zone. There is no  pneumothorax. No large pleural effusion. There is stable blunting of the costophrenic angles bilaterally. There is a nodular density in the left lower lung zone. The heart size is stable. Aortic calcifications are noted. Again noted is a compression fracture of the L1 vertebral body. There are few air-fluid levels in the abdomen, especially on the left. The liver shadow appears enlarged. IMPRESSION: 1. No acute displaced rib fracture. 2. New airspace opacities in the right upper lung zone may represent developing pneumonia or aspiration. 3. Nodular density in the left lower lung zone. A 4-6 week follow-up two-view chest x-ray is recommended to confirm resolution of this finding. 4. Air-fluid levels in the left mid abdomen may represent an ileus or developing small bowel obstruction. Electronically Signed   By: Constance Holster M.D.   On: 09/01/2020 17:50   CT Chest Wo Contrast  Result Date: 09/02/2020 CLINICAL DATA:  Chest pain, dyspnea EXAM: CT CHEST WITHOUT CONTRAST TECHNIQUE: Multidetector CT imaging of the chest was performed following the standard protocol without IV contrast. COMPARISON:  01/17/2020 FINDINGS: Cardiovascular: There is mild global cardiomegaly again identified with biatrial enlargement. Extensive coronary artery calcification. Trace pericardial effusion is stable since prior examination. Central pulmonary arteries are enlarged in keeping with changes of pulmonary arterial hypertension. Moderate atherosclerotic calcification is seen within the thoracic aorta without evidence of aneurysm. Mediastinum/Nodes: No pathologic adenopathy. Small hiatal hernia. Thyroid unremarkable. Lungs/Pleura: There has developed focal consolidation within the posterior segment of the right upper lobe. Additionally, there has developed diffuse bronchial wall thickening, most severe within the right upper lobe, in keeping with airway inflammation. Together, the findings are in compatible with changes of  bronchopneumonia. No pneumothorax or pleural effusion. Mild bibasilar atelectasis. No central obstructing mass. Upper Abdomen: Partially calcified cyst within the interpolar region of the left kidney is partially visualized on this examination but appears unchanged. No acute abnormality. Musculoskeletal: Osseous structures are age-appropriate. IMPRESSION: Changes of bronchopneumonia with focal consolidation within the right upper lobe. Extensive coronary artery calcification. Pulmonary artery hypertension. Aortic Atherosclerosis (ICD10-I70.0). Electronically Signed   By: Fidela Salisbury MD   On: 09/02/2020 04:19    Orson Eva, DO  [facility]  If 7PM-7AM, please contact night-coverage [email or web address] Password [email or web address] 09/03/2020, 6:02 PM   LOS: 1 day

## 2020-09-03 NOTE — Progress Notes (Signed)
Physical Therapy Treatment Patient Details Name: Tony Holt MRN: [id number] DOB: 1926-10-29 Today's Date: 09/03/2020    History of Present Illness Tony Holt  is a 86 y.o. male,with history of cancer, GERD, HTN, BPH, and dysrhythmia, presents to the ER with a chief complaint of flu like symptoms. Patient requests that I obtain history from daughter. Daughter reports that patient had a fall ten days ago. He had an inferior pubic ramus fracture. After that he was feeling sore all over, but did not complain of chest pain. 5 days ago he started to experience right sided chest pain that was worse with deep inspiration. The pain became progressively worse over five days. He reported that he was feeling flu like symptoms. Daughter reports that he never had a fever. She thought that he may have pneumonia, since he has had it before, and brought him in to the ED. Patient reports that he has had associated nausea, palpitations, loose stools. He reports no other symptoms.    PT Comments    Patient demonstrate slightly increased endurance for taking steps with good return for maintain standing balance using RW, limited mostly due to mild SOB with SpO2 staying under 88% throughout visit while on 2 LPM O2.  Patient tolerated sitting up in chair after therapy - RN notified.  Patient will benefit from continued physical therapy in hospital and recommended venue below to increase strength, balance, endurance for safe ADLs and gait.    Follow Up Recommendations  Home health PT;Supervision for mobility/OOB;Supervision - Intermittent     Equipment Recommendations  None recommended by PT    Recommendations for Other Services       Precautions / Restrictions Precautions Precautions: Fall Restrictions Weight Bearing Restrictions: No    Mobility  Bed Mobility Overal bed mobility: Needs Assistance Bed Mobility: Supine to Sit     Supine to sit: Min assist;Min guard     General bed mobility  comments: increased time, required assistance to move RLE  Transfers Overall transfer level: Needs assistance Equipment used: Rolling walker (2 wheeled) Transfers: Sit to/from [facility] Sit to Stand: Min guard Stand pivot transfers: Min guard;Min assist       General transfer comment: slightly labored movement  Ambulation/Gait Ambulation/Gait assistance: Min assist Gait Distance (Feet): 25 Feet Assistive device: Rolling walker (2 wheeled) Gait Pattern/deviations: Decreased step length - right;Decreased step length - left;Decreased stride length Gait velocity: decreased   General Gait Details: slightly labored slow cadence without loss of balance, limited secondary to fatigue and SpO2 dropping from 88% to 80% while on 2 LPM O2   Stairs             Wheelchair Mobility    Modified Rankin (Stroke Patients Only)       Balance Overall balance assessment: Needs assistance Sitting-balance support: Feet supported;No upper extremity supported Sitting balance-Leahy Scale: Good Sitting balance - Comments: seated at EOB   Standing balance support: During functional activity;Bilateral upper extremity supported Standing balance-Leahy Scale: Fair Standing balance comment: using RW                            Cognition Arousal/Alertness: Awake/alert Behavior During Therapy: WFL for tasks assessed/performed Overall Cognitive Status: Within Functional Limits for tasks assessed                                 General Comments: Very  [facility]      Exercises General Exercises - Lower Extremity Long Arc Quad: Seated;AROM;Strengthening;Both;10 reps Hip Flexion/Marching: Seated;AROM;Strengthening;Both;10 reps Toe Raises: Seated;AROM;Strengthening;Both;10 reps Heel Raises: Seated;AROM;Strengthening;Both;10 reps    General Comments        Pertinent Vitals/Pain Pain Assessment: No/denies pain    Home Living                       Prior Function            PT Goals (current goals can now be found in the care plan section) Acute Rehab PT Goals Patient Stated Goal: return home with family to assist PT Goal Formulation: With patient/family Time For Goal Achievement: 09/09/20 Potential to Achieve Goals: Good Progress towards PT goals: Progressing toward goals    Frequency    Min 3X/week      PT Plan Current plan remains appropriate    Co-evaluation              AM-PAC PT "6 Clicks" Mobility   Outcome Measure  Help needed turning from your back to your side while in a flat bed without using bedrails?: None Help needed moving from lying on your back to sitting on the side of a flat bed without using bedrails?: A Little Help needed moving to and from a bed to a chair (including a wheelchair)?: A Little Help needed standing up from a chair using your arms (e.g., wheelchair or bedside chair)?: A Little Help needed to walk in hospital room?: A Lot Help needed climbing 3-5 steps with a railing? : A Lot 6 Click Score: 17    End of Session Equipment Utilized During Treatment: Oxygen Activity Tolerance: Patient tolerated treatment well;Patient limited by fatigue Patient left: in chair;with call bell/phone within reach;with chair alarm set Nurse Communication: Mobility status PT Visit Diagnosis: Unsteadiness on feet (R26.81);Other abnormalities of gait and mobility (R26.89);Muscle weakness (generalized) (M62.81)     Time: 9983-3825 PT Time Calculation (min) (ACUTE ONLY): 28 min  Charges:  $Gait Training: 8-22 mins $Therapeutic Exercise: 8-22 mins                     12:08 PM, 09/03/20 Lonell Grandchild, MPT Physical Therapist with [facility] 336 [phone] office [phone] mobile phone

## 2020-09-04 ENCOUNTER — Encounter (HOSPITAL_COMMUNITY): Payer: Self-pay | Admitting: Family Medicine

## 2020-09-04 ENCOUNTER — Inpatient Hospital Stay (HOSPITAL_COMMUNITY): Payer: Medicare HMO

## 2020-09-04 LAB — CBC
HCT: 30 % — ABNORMAL LOW (ref 39.0–52.0)
Hemoglobin: 9.1 g/dL — ABNORMAL LOW (ref 13.0–17.0)
MCH: 32.3 pg (ref 26.0–34.0)
MCHC: 30.3 g/dL (ref 30.0–36.0)
MCV: 106.4 fL — ABNORMAL HIGH (ref 80.0–100.0)
Platelets: 175 10*3/uL (ref 150–400)
RBC: 2.82 MIL/uL — ABNORMAL LOW (ref 4.22–5.81)
RDW: 13.6 % (ref 11.5–15.5)
WBC: 7.2 10*3/uL (ref 4.0–10.5)
nRBC: 0 % (ref 0.0–0.2)

## 2020-09-04 LAB — BASIC METABOLIC PANEL
Anion gap: 10 (ref 5–15)
BUN: 43 mg/dL — ABNORMAL HIGH (ref 8–23)
CO2: 17 mmol/L — ABNORMAL LOW (ref 22–32)
Calcium: 9 mg/dL (ref 8.9–10.3)
Chloride: 112 mmol/L — ABNORMAL HIGH (ref 98–111)
Creatinine, Ser: 1.41 mg/dL — ABNORMAL HIGH (ref 0.61–1.24)
GFR calc Af Amer: 50 mL/min — ABNORMAL LOW (ref >60)
GFR calc non Af Amer: 43 mL/min — ABNORMAL LOW (ref >60)
Glucose, Bld: 95 mg/dL (ref 70–99)
Potassium: 4.8 mmol/L (ref 3.5–5.1)
Sodium: 139 mmol/L (ref 135–145)

## 2020-09-04 LAB — MRSA PCR SCREENING: MRSA by PCR: POSITIVE — AB

## 2020-09-04 MED ORDER — CEFDINIR 300 MG PO CAPS: 300.0000 mg | ORAL_CAPSULE | Freq: Two times a day (BID) | ORAL | 0 refills | Status: DC

## 2020-09-04 MED ORDER — ASPIRIN 81 MG PO CAPS: 81.0000 mg | ORAL_CAPSULE | Freq: Every day | ORAL | Status: AC

## 2020-09-04 MED ORDER — AZITHROMYCIN 250 MG PO TABS: 500.0000 mg | ORAL_TABLET | Freq: Every day | ORAL | Status: DC

## 2020-09-04 MED ORDER — AZITHROMYCIN 500 MG PO TABS
500.0000 mg | ORAL_TABLET | Freq: Every day | ORAL | 0 refills | Status: DC
Start: 2020-09-04 — End: 2020-10-01

## 2020-09-04 MED ORDER — CEFDINIR 300 MG PO CAPS: 300.0000 mg | ORAL_CAPSULE | Freq: Two times a day (BID) | ORAL | Status: DC

## 2020-09-04 MED ORDER — IOHEXOL 350 MG/ML SOLN
60.0000 mL | Freq: Once | INTRAVENOUS | Status: AC | PRN
  Administered 2020-09-04: 60 mL via INTRAVENOUS

## 2020-09-04 NOTE — Progress Notes (Signed)
Physical Therapy Treatment Patient Details Name: Tony Holt MRN: [id number] DOB: 07/20/1926 Today's Date: 09/04/2020    History of Present Illness Tony Holt  is a 86 y.o. male,with history of cancer, GERD, HTN, BPH, and dysrhythmia, presents to the ER with a chief complaint of flu like symptoms. Patient requests that I obtain history from daughter. Daughter reports that patient had a fall ten days ago. He had an inferior pubic ramus fracture. After that he was feeling sore all over, but did not complain of chest pain. 5 days ago he started to experience right sided chest pain that was worse with deep inspiration. The pain became progressively worse over five days. He reported that he was feeling flu like symptoms. Daughter reports that he never had a fever. She thought that he may have pneumonia, since he has had it before, and brought him in to the ED. Patient reports that he has had associated nausea, palpitations, loose stools. He reports no other symptoms.    PT Comments    Patient demonstrates increased endurance/distance for ambulation in room/hallway, limited mostly due to SpO2 dropping from 98% to 80% while on room air, once seated SpO2 increased to 95% within 5-10 seconds - RN notified.  Patient taken to procedure in wheelchair by nursing staff after therapy.  Patient will benefit from continued physical therapy in hospital and recommended venue below to increase strength, balance, endurance for safe ADLs and gait.    Follow Up Recommendations  Home health PT;Supervision for mobility/OOB;Supervision - Intermittent     Equipment Recommendations  None recommended by PT    Recommendations for Other Services       Precautions / Restrictions Precautions Precautions: Fall Restrictions Weight Bearing Restrictions: No    Mobility  Bed Mobility Overal bed mobility: Needs Assistance Bed Mobility: Supine to Sit     Supine to sit: Supervision     General bed mobility  comments: requires less assistance for sitting up at bedside  Transfers Overall transfer level: Needs assistance Equipment used: Rolling walker (2 wheeled) Transfers: Sit to/from [facility] Sit to Stand: Supervision Stand pivot transfers: Min guard       General transfer comment: increased BLE strength for completing sit to stands  Ambulation/Gait Ambulation/Gait assistance: Min guard;Min assist Gait Distance (Feet): 65 Feet Assistive device: Rolling walker (2 wheeled) Gait Pattern/deviations: Decreased step length - right;Decreased step length - left;Decreased stride length Gait velocity: decreased   General Gait Details: demonstrates increased endurance/distance for ambulation with slow labored cadence without loss of balance, on room air with SpO2 dropping from 98% to 80%, limited secondary to fatigue and mild SOB   Stairs             Wheelchair Mobility    Modified Rankin (Stroke Patients Only)       Balance Overall balance assessment: Needs assistance Sitting-balance support: Feet supported;No upper extremity supported Sitting balance-Leahy Scale: Good Sitting balance - Comments: seated at EOB   Standing balance support: During functional activity;Bilateral upper extremity supported Standing balance-Leahy Scale: Fair Standing balance comment: using RW                            Cognition Arousal/Alertness: Awake/alert Behavior During Therapy: WFL for tasks assessed/performed Overall Cognitive Status: Within Functional Limits for tasks assessed  General Comments: Very [employer] data [employer] Exercises - Lower Extremity Long Arc Quad: Seated;AROM;Strengthening;Both;10 reps Hip Flexion/Marching: Seated;AROM;Strengthening;Both;10 reps Toe Raises: Seated;AROM;Strengthening;Both;10 reps Heel Raises: Seated;AROM;Strengthening;Both;10 reps    General Comments        Pertinent  Vitals/Pain Pain Assessment: No/denies pain    Home Living                      Prior Function            PT Goals (current goals can now be found in the care plan section) Acute Rehab PT Goals Patient Stated Goal: return home with family to assist PT Goal Formulation: With patient/family Time For Goal Achievement: 09/09/20 Potential to Achieve Goals: Good Progress towards PT goals: Progressing toward goals    Frequency    Min 3X/week      PT Plan Current plan remains appropriate    Co-evaluation              AM-PAC PT "6 Clicks" Mobility   Outcome Measure  Help needed turning from your back to your side while in a flat bed without using bedrails?: None Help needed moving from lying on your back to sitting on the side of a flat bed without using bedrails?: None Help needed moving to and from a bed to a chair (including a wheelchair)?: A Little Help needed standing up from a chair using your arms (e.g., wheelchair or bedside chair)?: A Little Help needed to walk in hospital room?: A Little Help needed climbing 3-5 steps with a railing? : A Lot 6 Click Score: 19    End of Session Equipment Utilized During Treatment: Oxygen Activity Tolerance: Patient tolerated treatment well;Patient limited by fatigue Patient left: in chair;with call bell/phone within reach (left in wheelchair to take to a procedure by nursing staff) Nurse Communication: Mobility status PT Visit Diagnosis: Unsteadiness on feet (R26.81);Other abnormalities of gait and mobility (R26.89);Muscle weakness (generalized) (M62.81)     Time: 1834-3735 PT Time Calculation (min) (ACUTE ONLY): 22 min  Charges:  $Gait Training: 8-22 mins $Therapeutic Exercise: 8-22 mins                     1:43 PM, 09/04/20 Lonell Grandchild, MPT Physical Therapist with [facility] [phone] office [phone] mobile phone

## 2020-09-04 NOTE — Discharge Summary (Signed)
Physician Discharge Summary  Tony Holt GYB:[id number] DOB: August 16, 1926 DOA: 09/02/2020  PCP: [name], MD  Admit date: 09/02/2020 Discharge date: 09/04/2020  Admitted From: Home Disposition:  Home  Recommendations for Outpatient Follow-up:  1. Follow up with PCP in 1-2 weeks 2. Please obtain BMP/CBC in one week   Home Health: [facility] Equipment/Devices: [facility]  Discharge Condition: Stable CODE STATUS: FULL Diet recommendation: Heart Healthy    Brief/Interim Summary: 86 y.o.male,with history of cancer, GERD, HTN, BPH, and dysrhythmia, presents to the ER with a chief complaint of flu like symptoms. Daughter reports that patient had a fall ten days ago. He had an inferior pubic ramus fracture on 08/22/20. After that he was feeling sore all over, but did not complain of chest pain. 5 days ago he started to experience right sided chest pain that was worse with deep inspiration. The pain became progressively worse over five days. He reported that he was feeling flu like symptoms. Daughter reports that he never had a fever. She thought that he may have pneumonia, since he has had it before, and brought him in to the ED. Patient reports that he has had associated nausea, palpitations, loose stools. He reports no other symptoms.   IN the ED T 97.9, HR 89, R 15, BP 102/72 down to 89% - and reported lower with ambulation WBC 8.0 BUN/Cr 84/2.81 Covid negative CT chest shows right upper lobe consolidation with diffuse bronchial wall thickening    Discharge Diagnoses:  Acute respiratory failure with hypoxia -secondary to pneumonia -stable on 2L -wean for saturation >92% -weaned to RA -ambulatory pulseox on day of d/c did not show desaturation <88%   Lobar pneumonia -09/02/20 ct chest as discussed above -continue ceftriaxone and azithro -covid negative -d/c home with cefdinir and azithro x 4 more days -CTA chest--no PE; Persistent airspace opacity and consolidation in the  posterior segment right upper lobe  Acute on chronic renal failure--CKD3a -baseline creatinine 1.3-1.5 -serum creatinine peaked 2.81 -improved with IVF -serum creatinine 1.41 on day of d/c  Prostate cancer -follows Dr. Kandis Nab Lupron -also receiving XRT in [place] -continue tamulosin  GERD -continue PPI  Deconditioning -PT-->HHPT  Chronic Atrial fibrillation -Currently rate controlled -Not on anticoagulation due to chronic anemia -discussed risks/benefits/alternatives of AC vs ASA-->daughter agrees to starting ASA 81 mg daily  Cecal adenocarcinoma -s/p R-hemicolectomy with re-anastomosis 02/2020   Discharge Instructions   Allergies as of 09/04/2020   No Known Allergies     Medication List    TAKE these medications   Aspirin 81 MG Caps Take 81 mg by mouth daily.   azithromycin 500 MG tablet Commonly known as: ZITHROMAX Take 1 tablet (500 mg total) by mouth daily.   cefdinir 300 MG capsule Commonly known as: OMNICEF Take 1 capsule (300 mg total) by mouth every 12 (twelve) hours.   CULTURELLE PROBIOTICS PO Take 1 tablet by mouth daily.   diphenhydramine-acetaminophen 25-500 MG Tabs tablet Commonly known as: TYLENOL PM Take 2 tablets by mouth at bedtime.   docusate sodium 100 MG capsule Commonly known as: COLACE Take 1 capsule (100 mg total) by mouth 2 (two) times daily as needed for mild constipation.   Ensure Take 237 mLs by mouth 2 (two) times daily between meals.   FLINTSTONES MULTIVITAMIN PO Take 1 tablet by mouth daily.   HYDROcodone-acetaminophen 5-325 MG tablet Commonly known as: NORCO/VICODIN Take 1 tablet by mouth every 12 (twelve) hours as needed for moderate pain.   omeprazole 20 MG capsule Commonly known  as: PRILOSEC Take 20 mg by mouth 2 (two) times daily.   tamsulosin 0.4 MG Caps capsule Commonly known as: FLOMAX Take 1 capsule (0.4 mg total) by mouth daily.       No Known  Allergies  Consultations:  none   Procedures/Studies: DG Ribs Unilateral W/Chest Right  Result Date: 09/01/2020 CLINICAL DATA:  Pain status post fall EXAM: RIGHT RIBS AND CHEST - 3+ VIEW COMPARISON:  June 21, 2019 FINDINGS: There are new airspace opacities in the right upper lung zone. There is no pneumothorax. No large pleural effusion. There is stable blunting of the costophrenic angles bilaterally. There is a nodular density in the left lower lung zone. The heart size is stable. Aortic calcifications are noted. Again noted is a compression fracture of the L1 vertebral body. There are few air-fluid levels in the abdomen, especially on the left. The liver shadow appears enlarged. IMPRESSION: 1. No acute displaced rib fracture. 2. New airspace opacities in the right upper lung zone may represent developing pneumonia or aspiration. 3. Nodular density in the left lower lung zone. A 4-6 week follow-up two-view chest x-ray is recommended to confirm resolution of this finding. 4. Air-fluid levels in the left mid abdomen may represent an ileus or developing small bowel obstruction. Electronically Signed   By: Constance Holster M.D.   On: 09/01/2020 17:50   CT Chest Wo Contrast  Result Date: 09/02/2020 CLINICAL DATA:  Chest pain, dyspnea EXAM: CT CHEST WITHOUT CONTRAST TECHNIQUE: Multidetector CT imaging of the chest was performed following the standard protocol without IV contrast. COMPARISON:  01/17/2020 FINDINGS: Cardiovascular: There is mild global cardiomegaly again identified with biatrial enlargement. Extensive coronary artery calcification. Trace pericardial effusion is stable since prior examination. Central pulmonary arteries are enlarged in keeping with changes of pulmonary arterial hypertension. Moderate atherosclerotic calcification is seen within the thoracic aorta without evidence of aneurysm. Mediastinum/Nodes: No pathologic adenopathy. Small hiatal hernia. Thyroid unremarkable. Lungs/Pleura:  There has developed focal consolidation within the posterior segment of the right upper lobe. Additionally, there has developed diffuse bronchial wall thickening, most severe within the right upper lobe, in keeping with airway inflammation. Together, the findings are in compatible with changes of bronchopneumonia. No pneumothorax or pleural effusion. Mild bibasilar atelectasis. No central obstructing mass. Upper Abdomen: Partially calcified cyst within the interpolar region of the left kidney is partially visualized on this examination but appears unchanged. No acute abnormality. Musculoskeletal: Osseous structures are age-appropriate. IMPRESSION: Changes of bronchopneumonia with focal consolidation within the right upper lobe. Extensive coronary artery calcification. Pulmonary artery hypertension. Aortic Atherosclerosis (ICD10-I70.0). Electronically Signed   By: Fidela Salisbury MD   On: 09/02/2020 04:19   CT ANGIO CHEST PE W OR WO CONTRAST  Result Date: 09/04/2020 CLINICAL DATA:  Shortness of breath with elevated D-dimer EXAM: CT ANGIOGRAPHY CHEST WITH CONTRAST TECHNIQUE: Multidetector CT imaging of the chest was performed using the standard protocol during bolus administration of intravenous contrast. Multiplanar CT image reconstructions and MIPs were obtained to evaluate the vascular anatomy. CONTRAST:  30mL OMNIPAQUE IOHEXOL 350 MG/ML SOLN COMPARISON:  Chest CT September 02, 2020 FINDINGS: Cardiovascular: There is no demonstrable pulmonary embolus. No thoracic aortic aneurysm or dissection. There are scattered foci of calcification in the visualized great vessels. There is aortic atherosclerosis. There are scattered foci of coronary artery calcification. There is a small pericardial effusion. The pericardium is not appreciably thickened. Mediastinum/Nodes: No evident thyroid lesions. There are scattered subcentimeter mediastinal lymph nodes which do not meet size criteria for pathologic  significance. No  adenopathy evident by size criteria. Small hiatal hernia again noted. Lungs/Pleura: There is airspace opacity with a degree of consolidation in the posterior segment of the right upper lobe, stable. There are apparent free-flowing pleural effusions, new compared to 2 days prior. There is persistent bibasilar atelectatic change. No new airspace consolidation evident. No pneumothorax. Upper Abdomen: There is reflux of contrast into the inferior vena cava and hepatic veins. There is upper abdominal aortic atherosclerosis. Visualized upper abdominal structures otherwise appear unremarkable. Musculoskeletal: There are foci of degenerative change in the thoracic spine. No blastic or lytic bone lesions. No evident chest wall lesions. Review of the MIP images confirms the above findings. IMPRESSION: 1. No evident pulmonary embolus. No thoracic aortic aneurysm or dissection. Foci of aortic atherosclerosis noted as well as foci of great vessel and coronary artery calcification. 2.  Small pericardial effusion. 3. Persistent airspace opacity and consolidation in the posterior segment right upper lobe. New fairly small pleural effusions bilaterally. Stable bibasilar atelectasis. 4.  No evident adenopathy. 5. Reflux of contrast into the inferior vena cava and hepatic veins may be indicative of a degree of increase in right heart pressure. 6.  Small hiatal hernia. Aortic Atherosclerosis (ICD10-I70.0). Electronically Signed   By: Lowella Grip III M.D.   On: 09/04/2020 11:22        Discharge Exam: Vitals:   09/03/20 2100 09/04/20 0629  BP: 131/84 110/71  Pulse: 90 87  Resp: 18 16  Temp: 99.3 F (37.4 C) 97.9 F (36.6 C)  SpO2: 95% 97%   Vitals:   09/03/20 1519 09/03/20 2100 09/04/20 0500 09/04/20 0629  BP:  131/84  110/71  Pulse:  90  87  Resp:  18  16  Temp:  99.3 F (37.4 C)  97.9 F (36.6 C)  TempSrc:      SpO2: 98% 95%  97%  Weight:   62.4 kg   Height:        General: Pt is alert, awake, not  in acute distress Cardiovascular: IRRR, S1/S2 +, no rubs, no gallops Respiratory: bilateral rales.  Diminished breath sounds. No wheeze Abdominal: Soft, NT, ND, bowel sounds + Extremities: no edema, no cyanosis   The results of significant diagnostics from this hospitalization (including imaging, microbiology, ancillary and laboratory) are listed below for reference.    Significant Diagnostic Studies: DG Ribs Unilateral W/Chest Right  Result Date: 09/01/2020 CLINICAL DATA:  Pain status post fall EXAM: RIGHT RIBS AND CHEST - 3+ VIEW COMPARISON:  June 21, 2019 FINDINGS: There are new airspace opacities in the right upper lung zone. There is no pneumothorax. No large pleural effusion. There is stable blunting of the costophrenic angles bilaterally. There is a nodular density in the left lower lung zone. The heart size is stable. Aortic calcifications are noted. Again noted is a compression fracture of the L1 vertebral body. There are few air-fluid levels in the abdomen, especially on the left. The liver shadow appears enlarged. IMPRESSION: 1. No acute displaced rib fracture. 2. New airspace opacities in the right upper lung zone may represent developing pneumonia or aspiration. 3. Nodular density in the left lower lung zone. A 4-6 week follow-up two-view chest x-ray is recommended to confirm resolution of this finding. 4. Air-fluid levels in the left mid abdomen may represent an ileus or developing small bowel obstruction. Electronically Signed   By: Constance Holster M.D.   On: 09/01/2020 17:50   CT Chest Wo Contrast  Result Date: 09/02/2020 CLINICAL DATA:  Chest pain, dyspnea EXAM: CT CHEST WITHOUT CONTRAST TECHNIQUE: Multidetector CT imaging of the chest was performed following the standard protocol without IV contrast. COMPARISON:  01/17/2020 FINDINGS: Cardiovascular: There is mild global cardiomegaly again identified with biatrial enlargement. Extensive coronary artery calcification. Trace  pericardial effusion is stable since prior examination. Central pulmonary arteries are enlarged in keeping with changes of pulmonary arterial hypertension. Moderate atherosclerotic calcification is seen within the thoracic aorta without evidence of aneurysm. Mediastinum/Nodes: No pathologic adenopathy. Small hiatal hernia. Thyroid unremarkable. Lungs/Pleura: There has developed focal consolidation within the posterior segment of the right upper lobe. Additionally, there has developed diffuse bronchial wall thickening, most severe within the right upper lobe, in keeping with airway inflammation. Together, the findings are in compatible with changes of bronchopneumonia. No pneumothorax or pleural effusion. Mild bibasilar atelectasis. No central obstructing mass. Upper Abdomen: Partially calcified cyst within the interpolar region of the left kidney is partially visualized on this examination but appears unchanged. No acute abnormality. Musculoskeletal: Osseous structures are age-appropriate. IMPRESSION: Changes of bronchopneumonia with focal consolidation within the right upper lobe. Extensive coronary artery calcification. Pulmonary artery hypertension. Aortic Atherosclerosis (ICD10-I70.0). Electronically Signed   By: Fidela Salisbury MD   On: 09/02/2020 04:19   CT ANGIO CHEST PE W OR WO CONTRAST  Result Date: 09/04/2020 CLINICAL DATA:  Shortness of breath with elevated D-dimer EXAM: CT ANGIOGRAPHY CHEST WITH CONTRAST TECHNIQUE: Multidetector CT imaging of the chest was performed using the standard protocol during bolus administration of intravenous contrast. Multiplanar CT image reconstructions and MIPs were obtained to evaluate the vascular anatomy. CONTRAST:  40mL OMNIPAQUE IOHEXOL 350 MG/ML SOLN COMPARISON:  Chest CT September 02, 2020 FINDINGS: Cardiovascular: There is no demonstrable pulmonary embolus. No thoracic aortic aneurysm or dissection. There are scattered foci of calcification in the visualized great  vessels. There is aortic atherosclerosis. There are scattered foci of coronary artery calcification. There is a small pericardial effusion. The pericardium is not appreciably thickened. Mediastinum/Nodes: No evident thyroid lesions. There are scattered subcentimeter mediastinal lymph nodes which do not meet size criteria for pathologic significance. No adenopathy evident by size criteria. Small hiatal hernia again noted. Lungs/Pleura: There is airspace opacity with a degree of consolidation in the posterior segment of the right upper lobe, stable. There are apparent free-flowing pleural effusions, new compared to 2 days prior. There is persistent bibasilar atelectatic change. No new airspace consolidation evident. No pneumothorax. Upper Abdomen: There is reflux of contrast into the inferior vena cava and hepatic veins. There is upper abdominal aortic atherosclerosis. Visualized upper abdominal structures otherwise appear unremarkable. Musculoskeletal: There are foci of degenerative change in the thoracic spine. No blastic or lytic bone lesions. No evident chest wall lesions. Review of the MIP images confirms the above findings. IMPRESSION: 1. No evident pulmonary embolus. No thoracic aortic aneurysm or dissection. Foci of aortic atherosclerosis noted as well as foci of great vessel and coronary artery calcification. 2.  Small pericardial effusion. 3. Persistent airspace opacity and consolidation in the posterior segment right upper lobe. New fairly small pleural effusions bilaterally. Stable bibasilar atelectasis. 4.  No evident adenopathy. 5. Reflux of contrast into the inferior vena cava and hepatic veins may be indicative of a degree of increase in right heart pressure. 6.  Small hiatal hernia. Aortic Atherosclerosis (ICD10-I70.0). Electronically Signed   By: Lowella Grip III M.D.   On: 09/04/2020 11:22     Microbiology: Recent Results (from the past 240 hour(s))  SARS Coronavirus 2 by RT PCR (hospital  order, performed in [facility] hospital lab) Nasopharyngeal Nasopharyngeal Swab     Status: None   Collection Time: 09/02/20  1:00 AM   Specimen: Nasopharyngeal Swab  Result Value Ref Range Status   SARS Coronavirus 2 NEGATIVE NEGATIVE Final    Comment: (NOTE) SARS-CoV-2 target nucleic acids are NOT DETECTED.  The SARS-CoV-2 RNA is generally detectable in upper and lower respiratory specimens during the acute phase of infection. The lowest concentration of SARS-CoV-2 viral copies this assay can detect is 250 copies / mL. A negative result does not preclude SARS-CoV-2 infection and should not be used as the sole basis for treatment or other patient management decisions.  A negative result may occur with improper specimen collection / handling, submission of specimen other than nasopharyngeal swab, presence of viral mutation(s) within the areas targeted by this assay, and inadequate number of viral copies (<250 copies / mL). A negative result must be combined with clinical observations, patient history, and epidemiological information.  Fact Sheet for Patients:   StrictlyIdeas.no  Fact Sheet for Healthcare Providers: BankingDealers.co.za  This test is not yet approved or  cleared by the [place] FDA and has been authorized for detection and/or diagnosis of SARS-CoV-2 by FDA under an Emergency Use Authorization (EUA).  This EUA will remain in effect (meaning this test can be used) for the duration of the COVID-19 declaration under Section 564(b)(1) of the Act, 21 U.S.C. section 360bbb-3(b)(1), unless the authorization is terminated or revoked sooner.  Performed at [facility], [address] [place], [place] [address]      Labs: Basic Metabolic Panel: Recent Labs  Lab 09/02/20 0200 09/02/20 0200 09/02/20 0618 09/02/20 0618 09/03/20 0552 09/04/20 0847  NA 137  --  139  --  140 139  K 4.8   < > 4.5   < > 4.3 4.8  CL 109   --  112*  --  113* 112*  CO2 16*  --  18*  --  15* 17*  GLUCOSE 108*  --  97  --  82 95  BUN 84*  --  81*  --  60* 43*  CREATININE 2.81*  --  2.46*  --  1.72* 1.41*  CALCIUM 9.5  --  9.1  --  9.2 9.0  MG  --   --  2.0  --   --   --    < > = values in this interval not displayed.   Liver Function Tests: Recent Labs  Lab 09/02/20 0618  AST 18  ALT 29  ALKPHOS 81  BILITOT 0.7  PROT 6.4*  ALBUMIN 3.2*   No results for input(s): LIPASE, AMYLASE in the last 168 hours. No results for input(s): AMMONIA in the last 168 hours. CBC: Recent Labs  Lab 09/02/20 0200 09/02/20 0618 09/03/20 0552 09/04/20 0847  WBC 8.0 6.9 5.4 7.2  NEUTROABS 6.7 5.4  --   --   HGB 10.1* 9.2* 8.9* 9.1*  HCT 33.2* 30.8* 29.6* 30.0*  MCV 104.1* 104.4* 104.2* 106.4*  PLT 212 229 212 175   Cardiac Enzymes: No results for input(s): CKTOTAL, CKMB, CKMBINDEX, TROPONINI in the last 168 hours. BNP: Invalid input(s): POCBNP CBG: No results for input(s): GLUCAP in the last 168 hours.  Time coordinating discharge:  36 minutes  Signed:  Orson Eva, DO [facility] Pager: [phone] 09/04/2020, 1:53 PM

## 2020-09-04 NOTE — Plan of Care (Signed)

## 2020-09-04 NOTE — Care Management Important Message (Signed)
Important Message  Patient Details  Name: Tony Holt MRN: [id number] Date of Birth: Aug 19, 1926   Medicare Important Message Given:  Yes     [name] 09/04/2020, 12:29 PM

## 2020-09-04 NOTE — Progress Notes (Signed)
Nsg Discharge Note  Admit Date:  09/02/2020 Discharge date: 09/04/2020   TASHAUN OBEY to be D/C'd Home per MD order.  AVS completed.  Copy for chart, and copy for patient signed, and dated. Patient/caregiver able to verbalize understanding.  Discharge Medication: Allergies as of 09/04/2020   No Known Allergies     Medication List    TAKE these medications   Aspirin 81 MG Caps Take 81 mg by mouth daily.   azithromycin 500 MG tablet Commonly known as: ZITHROMAX Take 1 tablet (500 mg total) by mouth daily.   cefdinir 300 MG capsule Commonly known as: OMNICEF Take 1 capsule (300 mg total) by mouth every 12 (twelve) hours.   CULTURELLE PROBIOTICS PO Take 1 tablet by mouth daily.   diphenhydramine-acetaminophen 25-500 MG Tabs tablet Commonly known as: TYLENOL PM Take 2 tablets by mouth at bedtime.   docusate sodium 100 MG capsule Commonly known as: COLACE Take 1 capsule (100 mg total) by mouth 2 (two) times daily as needed for mild constipation.   Ensure Take 237 mLs by mouth 2 (two) times daily between meals.   FLINTSTONES MULTIVITAMIN PO Take 1 tablet by mouth daily.   HYDROcodone-acetaminophen 5-325 MG tablet Commonly known as: NORCO/VICODIN Take 1 tablet by mouth every 12 (twelve) hours as needed for moderate pain.   omeprazole 20 MG capsule Commonly known as: PRILOSEC Take 20 mg by mouth 2 (two) times daily.   tamsulosin 0.4 MG Caps capsule Commonly known as: FLOMAX Take 1 capsule (0.4 mg total) by mouth daily.       Discharge Assessment: Vitals:   09/03/20 2100 09/04/20 0629  BP: 131/84 110/71  Pulse: 90 87  Resp: 18 16  Temp: 99.3 F (37.4 C) 97.9 F (36.6 C)  SpO2: 95% 97%   Skin clean, dry and intact without evidence of skin break down, no evidence of skin tears noted. IV catheter discontinued intact. Site without signs and symptoms of complications - no redness or edema noted at insertion site, patient denies c/o pain - only slight tenderness  at site.  Dressing with slight pressure applied.  D/c Instructions-Education: Discharge instructions given to patient/family with verbalized understanding. D/c education completed with patient/family including follow up instructions, medication list, d/c activities limitations if indicated, with other d/c instructions as indicated by MD - patient able to verbalize understanding, all questions fully answered. Patient instructed to return to ED, call 911, or call MD for any changes in condition.  Patient escorted via [place], and D/C home via private auto.  Dorcas Mcmurray, LPN 9/93/5701 7:79 PM

## 2020-09-04 NOTE — Plan of Care (Signed)
  Problem: Education: Goal: Knowledge of General Education information will improve Description: Including pain rating scale, medication(s)/side effects and non-pharmacologic comfort measures 09/04/2020 2038 by Santa Lighter, RN Outcome: Adequate for Discharge 09/04/2020 0918 by Santa Lighter, RN Outcome: Progressing   Problem: Health Behavior/Discharge Planning: Goal: Ability to manage health-related needs will improve 09/04/2020 2038 by Santa Lighter, RN Outcome: Adequate for Discharge 09/04/2020 0918 by Santa Lighter, RN Outcome: Progressing   Problem: Clinical Measurements: Goal: Ability to maintain clinical measurements within normal limits will improve 09/04/2020 2038 by Santa Lighter, RN Outcome: Adequate for Discharge 09/04/2020 0918 by Santa Lighter, RN Outcome: Progressing Goal: Will remain free from infection Outcome: Adequate for Discharge Goal: Diagnostic test results will improve Outcome: Adequate for Discharge Goal: Respiratory complications will improve Outcome: Adequate for Discharge Goal: Cardiovascular complication will be avoided Outcome: Adequate for Discharge   Problem: Activity: Goal: Risk for activity intolerance will decrease Outcome: Adequate for Discharge   Problem: Nutrition: Goal: Adequate nutrition will be maintained Outcome: Adequate for Discharge   Problem: Coping: Goal: Level of anxiety will decrease Outcome: Adequate for Discharge   Problem: Elimination: Goal: Will not experience complications related to bowel motility Outcome: Adequate for Discharge Goal: Will not experience complications related to urinary retention Outcome: Adequate for Discharge   Problem: Pain Managment: Goal: General experience of comfort will improve Outcome: Adequate for Discharge   Problem: Skin Integrity: Goal: Risk for impaired skin integrity will decrease Outcome: Adequate for Discharge   Problem: Safety: Goal: Ability to remain free from injury  will improve Outcome: Adequate for Discharge

## 2020-09-04 NOTE — Progress Notes (Signed)
CRITICAL VALUE ALERT  Critical Value:  MRSA positive PCR  Date & Time Notied:  09/04/2020 at 1423  Provider Notified: Dr. Shanon Brow Tat  Orders Received/Actions taken: Notified doctor and he said that he would not be sending new orders for that home with patient.

## 2020-09-04 NOTE — TOC Transition Note (Signed)
Transition of Care [facility]) - CM/SW Discharge Note   Patient Details  Name: Tony Holt MRN: [id number] Date of Birth: 1926-03-16  Transition of Care [facility]) CM/SW Contact:  Brytney Somes, Chauncey Reading, RN Phone Number: 09/04/2020, 12:49 PM   Clinical Narrative:  Patient discharging home today. Does not qualify for home oxygen. Discussed home health with patient and daughter. Patient adamantly refuses home health.  He reports his daughter and son can assist him at home. He has a RW and BSC at home. Daughter reports she will be staying with patient.   Updated daughter that PCP can order home health if they change their mind.     Final next level of care: Home/Self Care Barriers to Discharge: No Barriers Identified

## 2020-09-05 DIAGNOSIS — C61 Malignant neoplasm of prostate: Secondary | ICD-10-CM | POA: Diagnosis not present

## 2020-09-05 DIAGNOSIS — K219 Gastro-esophageal reflux disease without esophagitis: Secondary | ICD-10-CM | POA: Diagnosis not present

## 2020-09-05 DIAGNOSIS — D400 Neoplasm of uncertain behavior of prostate: Secondary | ICD-10-CM | POA: Diagnosis not present

## 2020-09-05 DIAGNOSIS — Z9889 Other specified postprocedural states: Secondary | ICD-10-CM | POA: Diagnosis not present

## 2020-09-05 DIAGNOSIS — Z85038 Personal history of other malignant neoplasm of large intestine: Secondary | ICD-10-CM | POA: Diagnosis not present

## 2020-09-08 ENCOUNTER — Other Ambulatory Visit: Payer: Self-pay

## 2020-09-08 DIAGNOSIS — K219 Gastro-esophageal reflux disease without esophagitis: Secondary | ICD-10-CM | POA: Diagnosis not present

## 2020-09-08 DIAGNOSIS — D400 Neoplasm of uncertain behavior of prostate: Secondary | ICD-10-CM | POA: Diagnosis not present

## 2020-09-08 DIAGNOSIS — C61 Malignant neoplasm of prostate: Secondary | ICD-10-CM | POA: Diagnosis not present

## 2020-09-08 DIAGNOSIS — Z9889 Other specified postprocedural states: Secondary | ICD-10-CM | POA: Diagnosis not present

## 2020-09-08 DIAGNOSIS — Z85038 Personal history of other malignant neoplasm of large intestine: Secondary | ICD-10-CM | POA: Diagnosis not present

## 2020-09-08 NOTE — Patient Outreach (Signed)
[facility]) Care Management  09/08/2020  Tony Holt February 18, 1926 [id number]     Transition of Care Referral  Referral Date: 09/08/2020 Referral Source: [employer] Discharge report Date of Discharge: 09/04/2020 Facility: [place]: [facility]    Referral received. Transition of care calls being completed via EMMI-automated calls. RN CM will outreach patient for any red flags received.     Plan: RN CM will close case at this time.    Enzo [place], RN,BSN,CCM [place] Management Telephonic Care Management Coordinator Direct Phone: [phone] Toll Free: [phone] Fax: [phone]

## 2020-09-09 DIAGNOSIS — D400 Neoplasm of uncertain behavior of prostate: Secondary | ICD-10-CM | POA: Diagnosis not present

## 2020-09-09 DIAGNOSIS — Z9889 Other specified postprocedural states: Secondary | ICD-10-CM | POA: Diagnosis not present

## 2020-09-09 DIAGNOSIS — K219 Gastro-esophageal reflux disease without esophagitis: Secondary | ICD-10-CM | POA: Diagnosis not present

## 2020-09-09 DIAGNOSIS — C61 Malignant neoplasm of prostate: Secondary | ICD-10-CM | POA: Diagnosis not present

## 2020-09-09 DIAGNOSIS — Z85038 Personal history of other malignant neoplasm of large intestine: Secondary | ICD-10-CM | POA: Diagnosis not present

## 2020-09-10 ENCOUNTER — Other Ambulatory Visit: Payer: Self-pay

## 2020-09-10 DIAGNOSIS — C61 Malignant neoplasm of prostate: Secondary | ICD-10-CM | POA: Diagnosis not present

## 2020-09-10 DIAGNOSIS — Z85038 Personal history of other malignant neoplasm of large intestine: Secondary | ICD-10-CM | POA: Diagnosis not present

## 2020-09-10 DIAGNOSIS — D400 Neoplasm of uncertain behavior of prostate: Secondary | ICD-10-CM | POA: Diagnosis not present

## 2020-09-10 DIAGNOSIS — Z9889 Other specified postprocedural states: Secondary | ICD-10-CM | POA: Diagnosis not present

## 2020-09-10 DIAGNOSIS — K219 Gastro-esophageal reflux disease without esophagitis: Secondary | ICD-10-CM | POA: Diagnosis not present

## 2020-09-10 NOTE — Patient Outreach (Signed)
[facility]) Care Management  09/10/2020  DARRIC PLANTE 1926-08-24 [id number]    EMMI-General Discharge RED ON EMMI ALERT Day # 4 Date: 09/09/2020 Red Alert Reason: "Lost interest in things? Yes Sad/hopeless/anxious/empty? Yes"   Outreach attempt # 1 to patient. No answer. RN CM left HIPAA compliant voicemail message along with contact info.       Plan: RN CM will make outreach attempt to patient within 3-4 business days. RN CM unable to send letter as no address on file.   Enzo [place], RN,BSN,CCM [place] Management Telephonic Care Management Coordinator Direct Phone: [phone] Toll Free: [phone] Fax: [phone]

## 2020-09-11 ENCOUNTER — Other Ambulatory Visit: Payer: Self-pay

## 2020-09-11 ENCOUNTER — Ambulatory Visit: Payer: Self-pay

## 2020-09-11 DIAGNOSIS — Z9889 Other specified postprocedural states: Secondary | ICD-10-CM | POA: Diagnosis not present

## 2020-09-11 DIAGNOSIS — Z85038 Personal history of other malignant neoplasm of large intestine: Secondary | ICD-10-CM | POA: Diagnosis not present

## 2020-09-11 DIAGNOSIS — C61 Malignant neoplasm of prostate: Secondary | ICD-10-CM | POA: Diagnosis not present

## 2020-09-11 DIAGNOSIS — K219 Gastro-esophageal reflux disease without esophagitis: Secondary | ICD-10-CM | POA: Diagnosis not present

## 2020-09-11 DIAGNOSIS — D400 Neoplasm of uncertain behavior of prostate: Secondary | ICD-10-CM | POA: Diagnosis not present

## 2020-09-11 NOTE — Patient Outreach (Signed)
[place] [facility]) Care Management  09/11/2020  Tony Holt 1926-10-21 [id number]    EMMI-General Discharge RED ON EMMI ALERT Day # 1 Date: 09/09/2020 Red Alert Reason: "Lost interest in things? Yes Sad/hopeless/anxious/empty? Yes"   Outreach attempt #2 to patient. Spoke with patient's daughter(Penny_DPR on file). She reports that patient is recovering an d getting better. Reviewed and addressed red alerts with caregiver. She reports that a few days ago patient was tearful when talking with her about all that he has been through and d his spirits seemed a little down. His mood/affect has since hanged and gotten better. Patient has good family support system to assist him as needed. Daughter has made MD follow up appt. She confirms patient has all his meds in the home and no issues or concerns regarding them. She denies any RN CM needs or concerns at this time. Patient has completed automated post discharge EMMI calls.      Plan: RN CM will close case at this time.  Enzo Montgomery, RN,BSN,CCM [place] Management Telephonic Care Management Coordinator Direct Phone: [phone] Toll Free: [phone] Fax: [phone]

## 2020-09-12 DIAGNOSIS — C61 Malignant neoplasm of prostate: Secondary | ICD-10-CM | POA: Diagnosis not present

## 2020-09-12 DIAGNOSIS — Z9889 Other specified postprocedural states: Secondary | ICD-10-CM | POA: Diagnosis not present

## 2020-09-12 DIAGNOSIS — K219 Gastro-esophageal reflux disease without esophagitis: Secondary | ICD-10-CM | POA: Diagnosis not present

## 2020-09-12 DIAGNOSIS — D400 Neoplasm of uncertain behavior of prostate: Secondary | ICD-10-CM | POA: Diagnosis not present

## 2020-09-12 DIAGNOSIS — Z85038 Personal history of other malignant neoplasm of large intestine: Secondary | ICD-10-CM | POA: Diagnosis not present

## 2020-09-15 DIAGNOSIS — C61 Malignant neoplasm of prostate: Secondary | ICD-10-CM | POA: Diagnosis not present

## 2020-09-15 DIAGNOSIS — Z85038 Personal history of other malignant neoplasm of large intestine: Secondary | ICD-10-CM | POA: Diagnosis not present

## 2020-09-15 DIAGNOSIS — Z9889 Other specified postprocedural states: Secondary | ICD-10-CM | POA: Diagnosis not present

## 2020-09-15 DIAGNOSIS — K219 Gastro-esophageal reflux disease without esophagitis: Secondary | ICD-10-CM | POA: Diagnosis not present

## 2020-09-15 DIAGNOSIS — D400 Neoplasm of uncertain behavior of prostate: Secondary | ICD-10-CM | POA: Diagnosis not present

## 2020-09-16 ENCOUNTER — Ambulatory Visit (INDEPENDENT_AMBULATORY_CARE_PROVIDER_SITE_OTHER): Payer: Medicare HMO

## 2020-09-16 ENCOUNTER — Other Ambulatory Visit: Payer: Self-pay | Admitting: Urology

## 2020-09-16 ENCOUNTER — Other Ambulatory Visit: Payer: Self-pay

## 2020-09-16 DIAGNOSIS — C61 Malignant neoplasm of prostate: Secondary | ICD-10-CM

## 2020-09-16 DIAGNOSIS — D400 Neoplasm of uncertain behavior of prostate: Secondary | ICD-10-CM | POA: Diagnosis not present

## 2020-09-16 DIAGNOSIS — K219 Gastro-esophageal reflux disease without esophagitis: Secondary | ICD-10-CM | POA: Diagnosis not present

## 2020-09-16 DIAGNOSIS — Z85038 Personal history of other malignant neoplasm of large intestine: Secondary | ICD-10-CM | POA: Diagnosis not present

## 2020-09-16 DIAGNOSIS — Z9889 Other specified postprocedural states: Secondary | ICD-10-CM | POA: Diagnosis not present

## 2020-09-16 LAB — MICROSCOPIC EXAMINATION
Bacteria, UA: NONE SEEN
Epithelial Cells (non renal): NONE SEEN /hpf (ref 0–10)
Renal Epithel, UA: NONE SEEN /hpf
WBC, UA: NONE SEEN /hpf (ref 0–5)

## 2020-09-16 LAB — URINALYSIS, ROUTINE W REFLEX MICROSCOPIC
Bilirubin, UA: NEGATIVE
Glucose, UA: NEGATIVE
Ketones, UA: NEGATIVE
Leukocytes,UA: NEGATIVE
Nitrite, UA: NEGATIVE
Specific Gravity, UA: 1.025 (ref 1.005–1.030)
Urobilinogen, Ur: 0.2 mg/dL (ref 0.2–1.0)
pH, UA: 5 (ref 5.0–7.5)

## 2020-09-16 LAB — BLADDER SCAN AMB NON-IMAGING: Scan Result: 25

## 2020-09-16 MED ORDER — SILODOSIN 8 MG PO CAPS: 8.0000 mg | ORAL_CAPSULE | Freq: Every day | ORAL | 3 refills | Status: DC

## 2020-09-17 ENCOUNTER — Telehealth: Payer: Self-pay

## 2020-09-17 DIAGNOSIS — C61 Malignant neoplasm of prostate: Secondary | ICD-10-CM | POA: Diagnosis not present

## 2020-09-17 DIAGNOSIS — Z85038 Personal history of other malignant neoplasm of large intestine: Secondary | ICD-10-CM | POA: Diagnosis not present

## 2020-09-17 DIAGNOSIS — Z9889 Other specified postprocedural states: Secondary | ICD-10-CM | POA: Diagnosis not present

## 2020-09-17 DIAGNOSIS — D400 Neoplasm of uncertain behavior of prostate: Secondary | ICD-10-CM | POA: Diagnosis not present

## 2020-09-17 DIAGNOSIS — K219 Gastro-esophageal reflux disease without esophagitis: Secondary | ICD-10-CM | POA: Diagnosis not present

## 2020-09-17 NOTE — Telephone Encounter (Signed)
Pt called and stated rapaflo was not covered by his insurance. A PA was started on [email or web address] today.

## 2020-09-18 DIAGNOSIS — K219 Gastro-esophageal reflux disease without esophagitis: Secondary | ICD-10-CM | POA: Diagnosis not present

## 2020-09-18 DIAGNOSIS — C61 Malignant neoplasm of prostate: Secondary | ICD-10-CM | POA: Diagnosis not present

## 2020-09-18 DIAGNOSIS — D400 Neoplasm of uncertain behavior of prostate: Secondary | ICD-10-CM | POA: Diagnosis not present

## 2020-09-18 DIAGNOSIS — Z85038 Personal history of other malignant neoplasm of large intestine: Secondary | ICD-10-CM | POA: Diagnosis not present

## 2020-09-18 DIAGNOSIS — Z9889 Other specified postprocedural states: Secondary | ICD-10-CM | POA: Diagnosis not present

## 2020-09-19 ENCOUNTER — Other Ambulatory Visit: Payer: Self-pay | Admitting: Urology

## 2020-09-19 DIAGNOSIS — K219 Gastro-esophageal reflux disease without esophagitis: Secondary | ICD-10-CM | POA: Diagnosis not present

## 2020-09-19 DIAGNOSIS — Z85038 Personal history of other malignant neoplasm of large intestine: Secondary | ICD-10-CM | POA: Diagnosis not present

## 2020-09-19 DIAGNOSIS — Z9889 Other specified postprocedural states: Secondary | ICD-10-CM | POA: Diagnosis not present

## 2020-09-19 DIAGNOSIS — C61 Malignant neoplasm of prostate: Secondary | ICD-10-CM | POA: Diagnosis not present

## 2020-09-19 DIAGNOSIS — D400 Neoplasm of uncertain behavior of prostate: Secondary | ICD-10-CM | POA: Diagnosis not present

## 2020-09-19 MED ORDER — ALFUZOSIN HCL ER 10 MG PO TB24: 10.0000 mg | ORAL_TABLET | Freq: Every day | ORAL | 11 refills | Status: DC

## 2020-09-19 NOTE — Progress Notes (Signed)
Pt here today for PVR and UA due to nocturia  PVR=32ml

## 2020-10-01 ENCOUNTER — Ambulatory Visit (HOSPITAL_COMMUNITY)
Admission: RE | Admit: 2020-10-01 | Discharge: 2020-10-01 | Disposition: A | Discharge status: Home / Self Care | Payer: Medicare HMO | Source: Ambulatory Visit | Attending: Nurse Practitioner | Admitting: Nurse Practitioner

## 2020-10-01 ENCOUNTER — Other Ambulatory Visit (HOSPITAL_COMMUNITY): Payer: Self-pay | Admitting: Nurse Practitioner

## 2020-10-01 ENCOUNTER — Ambulatory Visit: Payer: Self-pay

## 2020-10-01 ENCOUNTER — Encounter: Payer: Self-pay | Admitting: Orthopaedic Surgery

## 2020-10-01 ENCOUNTER — Other Ambulatory Visit: Payer: Self-pay

## 2020-10-01 ENCOUNTER — Ambulatory Visit (INDEPENDENT_AMBULATORY_CARE_PROVIDER_SITE_OTHER): Payer: Medicare HMO | Admitting: Orthopaedic Surgery

## 2020-10-01 ENCOUNTER — Other Ambulatory Visit (HOSPITAL_COMMUNITY)
Admission: RE | Admit: 2020-10-01 | Discharge: 2020-10-01 | Disposition: A | Discharge status: Home / Self Care | Payer: Medicare HMO | Source: Ambulatory Visit | Attending: Nurse Practitioner | Admitting: Nurse Practitioner

## 2020-10-01 DIAGNOSIS — J811 Chronic pulmonary edema: Secondary | ICD-10-CM | POA: Diagnosis not present

## 2020-10-01 DIAGNOSIS — G8929 Other chronic pain: Secondary | ICD-10-CM | POA: Diagnosis not present

## 2020-10-01 DIAGNOSIS — S329XXA Fracture of unspecified parts of lumbosacral spine and pelvis, initial encounter for closed fracture: Secondary | ICD-10-CM

## 2020-10-01 DIAGNOSIS — S32591G Other specified fracture of right pubis, subsequent encounter for fracture with delayed healing: Secondary | ICD-10-CM | POA: Diagnosis not present

## 2020-10-01 DIAGNOSIS — M1611 Unilateral primary osteoarthritis, right hip: Secondary | ICD-10-CM

## 2020-10-01 DIAGNOSIS — J189 Pneumonia, unspecified organism: Secondary | ICD-10-CM | POA: Insufficient documentation

## 2020-10-01 DIAGNOSIS — J90 Pleural effusion, not elsewhere classified: Secondary | ICD-10-CM | POA: Diagnosis not present

## 2020-10-01 DIAGNOSIS — M1612 Unilateral primary osteoarthritis, left hip: Secondary | ICD-10-CM | POA: Diagnosis not present

## 2020-10-01 DIAGNOSIS — M545 Low back pain, unspecified: Secondary | ICD-10-CM | POA: Diagnosis not present

## 2020-10-01 DIAGNOSIS — Z79891 Long term (current) use of opiate analgesic: Secondary | ICD-10-CM | POA: Diagnosis not present

## 2020-10-01 DIAGNOSIS — R0602 Shortness of breath: Secondary | ICD-10-CM | POA: Diagnosis not present

## 2020-10-01 LAB — BRAIN NATRIURETIC PEPTIDE: B Natriuretic Peptide: 475 pg/mL — ABNORMAL HIGH (ref 0.0–100.0)

## 2020-10-01 LAB — CBC
HCT: 34.5 % — ABNORMAL LOW (ref 39.0–52.0)
Hemoglobin: 10.5 g/dL — ABNORMAL LOW (ref 13.0–17.0)
MCH: 32.2 pg (ref 26.0–34.0)
MCHC: 30.4 g/dL (ref 30.0–36.0)
MCV: 105.8 fL — ABNORMAL HIGH (ref 80.0–100.0)
Platelets: 149 10*3/uL — ABNORMAL LOW (ref 150–400)
RBC: 3.26 MIL/uL — ABNORMAL LOW (ref 4.22–5.81)
RDW: 15.2 % (ref 11.5–15.5)
WBC: 5.9 10*3/uL (ref 4.0–10.5)
nRBC: 0 % (ref 0.0–0.2)

## 2020-10-01 LAB — COMPREHENSIVE METABOLIC PANEL
ALT: <5 U/L (ref 0–44)
AST: 11 U/L — ABNORMAL LOW (ref 15–41)
Albumin: 3.8 g/dL (ref 3.5–5.0)
Alkaline Phosphatase: 62 U/L (ref 38–126)
Anion gap: 10 (ref 5–15)
BUN: 14 mg/dL (ref 8–23)
CO2: 25 mmol/L (ref 22–32)
Calcium: 9.4 mg/dL (ref 8.9–10.3)
Chloride: 106 mmol/L (ref 98–111)
Creatinine, Ser: 1.27 mg/dL — ABNORMAL HIGH (ref 0.61–1.24)
GFR, Estimated: 49 mL/min — ABNORMAL LOW (ref >60)
Glucose, Bld: 100 mg/dL — ABNORMAL HIGH (ref 70–99)
Potassium: 4.3 mmol/L (ref 3.5–5.1)
Sodium: 141 mmol/L (ref 135–145)
Total Bilirubin: 0.5 mg/dL (ref 0.3–1.2)
Total Protein: 6.9 g/dL (ref 6.5–8.1)

## 2020-10-01 NOTE — Progress Notes (Signed)
Office Visit Note   Patient: Tony Holt           Date of Birth: Dec 21, 1925           MRN: [id number] Visit Date: 10/01/2020              Requested by: [name], MD [address] [place],  [place] [address] PCP: [name], MD   Assessment & Plan: Visit Diagnoses:  1. Unilateral primary osteoarthritis, right hip   2. Unilateral primary osteoarthritis, left hip   3. Chronic bilateral low back pain without sciatica   4. Pelvis fracture, right, closed, initial encounter [facility])     Plan: Tony Holt is 86 years old and accompanied by his daughter.  Tony Holt fell approximately 6 weeks ago when follow-up films demonstrated a nondisplaced fracture of the inferior pubic rami.  His daughter relates that Tony Holt is actually improving and able to bear a little bit more weight but still having some pain in the area of his right hip and thigh.  Films today demonstrate a fracture of the superior pubic rami at the pubic junction and a probable nondisplaced fracture of the inferior pubic rami.  There is also a possibility of a nondisplaced fracture of the left inferior pubic rami.  Tony Holt is having minimal discomfort on the left.  I think Tony Holt will continue to improve although slowly.  Tony Holt just finished a course of chemotherapy 2 weeks ago for prostate cancer.  Tony Holt can bear weight to tolerance using his walker and like to check him back 1 more time in a month Follow-Up Instructions: Return in about 1 month (around 11/01/2020).   Orders:  Orders Placed This Encounter  Procedures  . XR HIP UNILAT W OR W/O PELVIS 1V RIGHT  . DG Lumbar Spine 1 View   No orders of the defined types were placed in this encounter.     Procedures: No procedures performed   Clinical Data: No additional findings.   Subjective: Chief Complaint  Patient presents with  . Right Knee - Pain  . Left Knee - Pain  Tony Holt is accompanied by his daughter and here for follow-up evaluation of a pelvic fracture.  Tony Holt fell  about 6 weeks ago with follow-up films demonstrating probable nondisplaced fracture of the right inferior pubic rami.  Tony Holt has been using a walker and daughter relates Tony Holt is feeling better.  Tony Holt does have a history of prostate cancer and finished chemotherapy 2 weeks ago.  Tony Holt is having difficulty with frequency of urination and that has been a problem with his pelvic pain and weightbearing but definitely "better".  Tony Holt does take hydrocodone for any pain per his primary care physician. Then the last several days she has had some trouble with shortness of breath and is in the midst of an evaluation by his primary care physician  HPI  Review of Systems   Objective: Vital Signs: There were no vitals taken for this visit.  Physical Exam Constitutional:      Appearance: Tony Holt is well-developed.  Eyes:     Pupils: Pupils are equal, round, and reactive to light.  Skin:    General: Skin is warm and dry.  Neurological:     Mental Status: Tony Holt is alert and oriented to person, place, and time.  Psychiatric:        Behavior: Behavior normal.     Ortho Exam evaluated in a wheelchair.  Difficult to understand with the mask and his voice was  quiet.  Seemed a little short of breath which is being evaluated by his primary care physician.  Tony Holt is scheduled to have chest x-ray later today.  Tony Holt did have some discomfort with internal/external rotation of his right hip.  No palpable tenderness about the lateral aspect of his hip or thigh.  No knee pain.  No percussible tenderness of his lumbar spine  Specialty Comments:  No specialty comments available.  Imaging: XR HIP UNILAT W OR W/O PELVIS 1V RIGHT  Result Date: 10/01/2020 A single AP the pelvis was obtained demonstrating a fracture of the superior pubic rami at the pubic junction.  There may be a nondisplaced fracture at the inferior pubic rami.  Also a possibility of a nondisplaced fracture of the left inferior pubic rami.  No pathology about the right hip.   Diffuse bony demineralization.  Some degenerative changes on the limited view of the AP spine in the AP projection    PMFS History: Patient Active Problem List   Diagnosis Date Noted  . Pelvis fracture, right, closed, initial encounter ([place]) 10/01/2020  . Lobar pneumonia ([place]) 09/03/2020  . Acute respiratory failure with hypoxia ([place]) 09/03/2020  . Acute renal failure superimposed on stage 3a chronic kidney disease ([place]) 09/03/2020  . Respiratory failure with hypoxia ([place]) 09/02/2020  . Community acquired pneumonia   . Elevated PSA 04/28/2020  . AKI (acute kidney injury) ([place]) 02/23/2020  . Prostate cancer ([place]) 02/22/2020  . Atrial fibrillation, chronic ([place]) 02/22/2020  . Cecal cancer ([place])   . IDA (iron deficiency anemia) 05/17/2019  . Constipation 05/17/2019  . Bradycardia 05/28/2016  . Weakness 05/28/2016  . Acute renal failure ([place]) 05/28/2016  . Hyperkalemia 05/28/2016  . Nausea alone 03/24/2012  . Abdominal pain, epigastric 03/24/2012  . HTN (hypertension) 03/24/2012  . ANEMIA 09/16/2010  . HEMOCCULT POSITIVE STOOL 09/16/2010   Past Medical History:  Diagnosis Date  . BPH (benign prostatic hyperplasia)   . Colon cancer ([place]) 1999  . Dysrhythmia    new onset A Fib  . Essential hypertension   . GERD (gastroesophageal reflux disease)   . Hard of hearing   . Prostate cancer [facility])     Family History  Problem Relation Age of Onset  . Heart attack Father        Deceased, age 35s  . Diabetes Brother   . Colon cancer Neg Hx   . Liver disease Neg Hx   . Breast cancer Neg Hx   . Prostate cancer Neg Hx   . Pancreatic cancer Neg Hx     Past Surgical History:  Procedure Laterality Date  . BIOPSY  12/28/2019   Procedure: BIOPSY;  Surgeon: Daneil Dolin, MD;  Location: [facility] ENDO SUITE;  Service: Endoscopy;;  gastric, esophagus,cecal  . CATARACT EXTRACTION W/PHACO  01/18/2013   Procedure: CATARACT EXTRACTION PHACO AND INTRAOCULAR LENS PLACEMENT ([place]);  Surgeon: Tonny Branch, MD;  Location: [facility] ORS;  Service: Ophthalmology;  Laterality: Left;  CDE 17.11  . CATARACT EXTRACTION W/PHACO Right 02/05/2013   Procedure: CATARACT EXTRACTION PHACO AND INTRAOCULAR LENS PLACEMENT (I[facility]);  Surgeon: Tonny Branch, MD;  Location: [facility] ORS;  Service: Ophthalmology;  Laterality: Right;  CDE:19.49  . COLON SURGERY  1999   Sigmoid colon cancer, segmental resection by Drs. Smith/Bradford  . COLONOSCOPY  2002   Multiple adenomatous colon polyps  . COLONOSCOPY N/A 12/28/2019   Procedure: COLONOSCOPY;  Surgeon: Daneil Dolin, MD;  Location: [facility] END[facility] SU[facility];  Service: Endoscopy;  Laterality: N/A;  .  ESOPHAGOGASTRODUODENOSCOPY  03/25/2012   Dr. Gala Romney: Severe ulcerative/erosive reflux esophagitis.  Small hiatal hernia.  Abnormal gastric and duodenal mucosa, biopsy showed reactive changes and minimal chronic inflammation but no H. pylori.  . ESOPHAGOGASTRODUODENOSCOPY N/A 12/28/2019   Procedure: ESOPHAGOGASTRODUODENOSCOPY (EGD);  Surgeon: Daneil Dolin, MD;  Location: [facility] SUITE;  Service: Endoscopy;  Laterality: N/A;  . HERNIA REPAIR  02/2011   Right inguinal hernia repair with mesh, Dr. Geroge Baseman  . HERNIA REPAIR     three total  . PARTIAL COLECTOMY Right 02/04/2020   Procedure: RIGHT HEMI-COLECTOMY;  Surgeon: Virl Cagey, MD;  Location: [facility] ORS;  Service: General;  Laterality: Right;  . POLYPECTOMY  12/28/2019   Procedure: POLYPECTOMY;  Surgeon: Daneil Dolin, MD;  Location: [facility];  Service: Endoscopy;;  rectal    Social History   Occupational History  . Occupation: Retired Neurosurgeon: RETIRED  Tobacco Use  . Smoking status: Former Smoker    Packs/day: 1.00    Years: 50.00    Pack years: 50.00    Types: Cigarettes    Quit date: 12/13/1989    Years since quitting: 30.8  . Smokeless tobacco: Current User    Types: Snuff  Vaping Use  . Vaping Use: Never used  Substance and Sexual Activity  . Alcohol use: No    Alcohol/week: 0.0 standard drinks     Comment: previous heavy drinker at age [age]  . Drug use: No  . Sexual activity: Not Currently     Garald Balding, MD   Note - This record has been created using [employer].  Chart creation errors have been sought, but may not always  have been located. Such creation errors do not reflect on  the standard of medical care.

## 2020-10-03 DIAGNOSIS — J189 Pneumonia, unspecified organism: Secondary | ICD-10-CM | POA: Diagnosis not present

## 2020-10-06 DIAGNOSIS — J189 Pneumonia, unspecified organism: Secondary | ICD-10-CM | POA: Diagnosis not present

## 2020-10-09 ENCOUNTER — Inpatient Hospital Stay (HOSPITAL_COMMUNITY): Payer: Medicare HMO | Attending: Hematology

## 2020-10-09 ENCOUNTER — Ambulatory Visit (HOSPITAL_COMMUNITY)
Admission: RE | Admit: 2020-10-09 | Discharge: 2020-10-09 | Disposition: A | Discharge status: Home / Self Care | Payer: Medicare HMO | Source: Ambulatory Visit | Attending: Hematology | Admitting: Hematology

## 2020-10-09 ENCOUNTER — Other Ambulatory Visit: Payer: Self-pay

## 2020-10-09 DIAGNOSIS — S32592A Other specified fracture of left pubis, initial encounter for closed fracture: Secondary | ICD-10-CM | POA: Diagnosis not present

## 2020-10-09 DIAGNOSIS — K838 Other specified diseases of biliary tract: Secondary | ICD-10-CM | POA: Diagnosis not present

## 2020-10-09 DIAGNOSIS — C180 Malignant neoplasm of cecum: Secondary | ICD-10-CM

## 2020-10-09 DIAGNOSIS — S32511A Fracture of superior rim of right pubis, initial encounter for closed fracture: Secondary | ICD-10-CM | POA: Diagnosis not present

## 2020-10-09 DIAGNOSIS — S32591A Other specified fracture of right pubis, initial encounter for closed fracture: Secondary | ICD-10-CM | POA: Diagnosis not present

## 2020-10-09 LAB — CBC WITH DIFFERENTIAL/PLATELET
Abs Immature Granulocytes: 0.03 10*3/uL (ref 0.00–0.07)
Basophils Absolute: 0 10*3/uL (ref 0.0–0.1)
Basophils Relative: 1 %
Eosinophils Absolute: 0.1 10*3/uL (ref 0.0–0.5)
Eosinophils Relative: 2 %
HCT: 37 % — ABNORMAL LOW (ref 39.0–52.0)
Hemoglobin: 11.5 g/dL — ABNORMAL LOW (ref 13.0–17.0)
Immature Granulocytes: 0 %
Lymphocytes Relative: 7 %
Lymphs Abs: 0.5 10*3/uL — ABNORMAL LOW (ref 0.7–4.0)
MCH: 32.2 pg (ref 26.0–34.0)
MCHC: 31.1 g/dL (ref 30.0–36.0)
MCV: 103.6 fL — ABNORMAL HIGH (ref 80.0–100.0)
Monocytes Absolute: 0.5 10*3/uL (ref 0.1–1.0)
Monocytes Relative: 7 %
Neutro Abs: 5.9 10*3/uL (ref 1.7–7.7)
Neutrophils Relative %: 83 %
Platelets: 191 10*3/uL (ref 150–400)
RBC: 3.57 MIL/uL — ABNORMAL LOW (ref 4.22–5.81)
RDW: 14 % (ref 11.5–15.5)
WBC: 7.2 10*3/uL (ref 4.0–10.5)
nRBC: 0 % (ref 0.0–0.2)

## 2020-10-09 LAB — COMPREHENSIVE METABOLIC PANEL
ALT: 17 U/L (ref 0–44)
AST: 17 U/L (ref 15–41)
Albumin: 3.7 g/dL (ref 3.5–5.0)
Alkaline Phosphatase: 67 U/L (ref 38–126)
Anion gap: 9 (ref 5–15)
BUN: 17 mg/dL (ref 8–23)
CO2: 27 mmol/L (ref 22–32)
Calcium: 9.3 mg/dL (ref 8.9–10.3)
Chloride: 103 mmol/L (ref 98–111)
Creatinine, Ser: 1.33 mg/dL — ABNORMAL HIGH (ref 0.61–1.24)
GFR, Estimated: 50 mL/min — ABNORMAL LOW (ref >60)
Glucose, Bld: 101 mg/dL — ABNORMAL HIGH (ref 70–99)
Potassium: 4.1 mmol/L (ref 3.5–5.1)
Sodium: 139 mmol/L (ref 135–145)
Total Bilirubin: 0.7 mg/dL (ref 0.3–1.2)
Total Protein: 6.6 g/dL (ref 6.5–8.1)

## 2020-10-09 MED ORDER — IOHEXOL 300 MG/ML  SOLN
100.0000 mL | Freq: Once | INTRAMUSCULAR | Status: AC | PRN
  Administered 2020-10-09: 100 mL via INTRAVENOUS

## 2020-10-10 DIAGNOSIS — J189 Pneumonia, unspecified organism: Secondary | ICD-10-CM | POA: Diagnosis not present

## 2020-10-10 LAB — CEA: CEA: 2.1 ng/mL (ref 0.0–4.7)

## 2020-10-15 ENCOUNTER — Ambulatory Visit (INDEPENDENT_AMBULATORY_CARE_PROVIDER_SITE_OTHER): Payer: Medicare HMO | Admitting: Urology

## 2020-10-15 ENCOUNTER — Encounter: Payer: Self-pay | Admitting: Urology

## 2020-10-15 ENCOUNTER — Other Ambulatory Visit: Payer: Self-pay

## 2020-10-15 DIAGNOSIS — N401 Enlarged prostate with lower urinary tract symptoms: Secondary | ICD-10-CM | POA: Diagnosis not present

## 2020-10-15 DIAGNOSIS — R351 Nocturia: Secondary | ICD-10-CM

## 2020-10-15 DIAGNOSIS — C61 Malignant neoplasm of prostate: Secondary | ICD-10-CM

## 2020-10-15 DIAGNOSIS — N400 Enlarged prostate without lower urinary tract symptoms: Secondary | ICD-10-CM | POA: Insufficient documentation

## 2020-10-15 DIAGNOSIS — N138 Other obstructive and reflux uropathy: Secondary | ICD-10-CM

## 2020-10-15 LAB — URINALYSIS, ROUTINE W REFLEX MICROSCOPIC
Bilirubin, UA: NEGATIVE
Glucose, UA: NEGATIVE
Ketones, UA: NEGATIVE
Leukocytes,UA: NEGATIVE
Nitrite, UA: NEGATIVE
Protein,UA: NEGATIVE
Specific Gravity, UA: 1.025 (ref 1.005–1.030)
Urobilinogen, Ur: 0.2 mg/dL (ref 0.2–1.0)
pH, UA: 5.5 (ref 5.0–7.5)

## 2020-10-15 LAB — MICROSCOPIC EXAMINATION
Bacteria, UA: NONE SEEN
Epithelial Cells (non renal): NONE SEEN /hpf (ref 0–10)
Renal Epithel, UA: NONE SEEN /hpf
WBC, UA: NONE SEEN /hpf (ref 0–5)

## 2020-10-15 MED ORDER — ALFUZOSIN HCL ER 10 MG PO TB24: 10.0000 mg | ORAL_TABLET | Freq: Every day | ORAL | 3 refills | Status: DC

## 2020-10-15 MED ORDER — MIRABEGRON ER 25 MG PO TB24: 25.0000 mg | ORAL_TABLET | Freq: Every day | ORAL | 0 refills | Status: DC

## 2020-10-15 NOTE — Patient Instructions (Signed)

## 2020-10-15 NOTE — Progress Notes (Signed)
10/15/2020 10:51 AM   Tony Holt July 28, 1926 [id number]  Referring provider: [name], MD [address] [place],  [place] [address]  followup prostate cancer and BPH  HPI: Tony Holt is a 86yo here for followup for prostate cancer. No recent PSA He has mild hot flashes and worsening fatigue on ADT. He is undergoing radiation therapy. Since starting radiation he has noted worsening urinary frequency, urgency and nocturia 8-10. We started uroxatral which improved the urinary stream. Nocturia 5-6x. Urinary urgency and frequency are unchanged on uroxatral   PMH: Past Medical History:  Diagnosis Date  . BPH (benign prostatic hyperplasia)   . Colon cancer ([place]) 1999  . Dysrhythmia    new onset A Fib  . Essential hypertension   . GERD (gastroesophageal reflux disease)   . Hard of hearing   . Prostate cancer [facility])     Surgical History: Past Surgical History:  Procedure Laterality Date  . BIOPSY  12/28/2019   Procedure: BIOPSY;  Surgeon: Daneil Dolin, MD;  Location[facility];  Service: Endoscopy;;  gastric, esophagus,cecal  . CATARACT EXTRACTION W/PHACO  01/18/2013   Procedure: CATARACT EXTRACTION PHACO AND INTRAOCULAR LENS PLACEMENT ([place]);  Surgeon: Tonny Branch, MD;  Location: [facility] ORS;  Service: Ophthalmology;  Laterality: Left;  CDE 17.11  . CATARACT EXTRACTION W/PHACO Right 02/05/2013   Procedure: CATARACT EXTRACTION PHACO AND INTRAOCULAR LENS PLACEMENT ([facility]);  Surgeon: Tonny Branch, MD;  Location: [facility] ORS;  Service: Ophthalmology;  Laterality: Right;  CDE:19.49  . COLON SURGERY  1999   Sigmoid colon cancer, segmental resection by Drs. Smith/Bradford  . COLONOSCOPY  2002   Multiple adenomatous colon polyps  . COLONOSCOPY N/A 12/28/2019   Procedure: COLONOSCOPY;  Surgeon: Daneil Dolin, MD;  Location: [facility];  Service: Endoscopy;  Laterality: N/A;  . ESOPHAGOGASTRODUODENOSCOPY  03/25/2012   Dr. Gala Romney: Severe ulcerative/erosive reflux esophagitis.  Small  hiatal hernia.  Abnormal gastric and duodenal mucosa, biopsy showed reactive changes and minimal chronic inflammation but no H. pylori.  . ESOPHAGOGASTRODUODENOSCOPY N/A 12/28/2019   Procedure: ESOPHAGOGASTRODUODENOSCOPY (EGD);  Surgeon: Daneil Dolin, MD;  Location: [facility];  Service: Endoscopy;  Laterality: N/A;  . HERNIA REPAIR  02/2011   Right inguinal hernia repair with mesh, Dr. Geroge Baseman  . HERNIA REPAIR     three total  . PARTIAL COLECTOMY Right 02/04/2020   Procedure: RIGHT HEMI-COLECTOMY;  Surgeon: Virl Cagey, MD;  Location: [facility] ORS;  Service: General;  Laterality: Right;  . POLYPECTOMY  12/28/2019   Procedure: POLYPECTOMY;  Surgeon: Daneil Dolin, MD;  Location: [facility];  Service: Endoscopy;;  rectal     Home Medications:  Allergies as of 10/15/2020   No Known Allergies     Medication List       Accurate as of October 15, 2020 10:51 AM. If you have any questions, ask your nurse or doctor.        STOP taking these medications   silodosin 8 MG Caps capsule Commonly known as: RAPAFLO Stopped by: Nicolette Bang, MD   tamsulosin 0.4 MG Caps capsule Commonly known as: FLOMAX Stopped by: Nicolette Bang, MD     TAKE these medications   alfuzosin 10 MG 24 hr tablet Commonly known as: UROXATRAL Take 1 tablet (10 mg total) by mouth daily with breakfast.   Aspirin 81 MG Caps Take 81 mg by mouth daily.   CULTURELLE PROBIOTICS PO Take 1 tablet by mouth daily.   diphenhydramine-acetaminophen 25-500 MG Tabs  tablet Commonly known as: TYLENOL PM Take 2 tablets by mouth at bedtime.   docusate sodium 100 MG capsule Commonly known as: COLACE Take 1 capsule (100 mg total) by mouth 2 (two) times daily as needed for mild constipation.   Ensure Take 237 mLs by mouth 2 (two) times daily between meals.   FLINTSTONES MULTIVITAMIN PO Take 1 tablet by mouth daily.   furosemide 20 MG tablet Commonly known as: LASIX   HYDROcodone-acetaminophen 7.5-325 MG  tablet Commonly known as: [place] What changed: Another medication with the same name was removed. Continue taking this medication, and follow the directions you see here. Changed by: Nicolette Bang, MD   mirabegron ER 25 MG Tb24 tablet Commonly known as: MYRBETRIQ Take 1 tablet (25 mg total) by mouth daily. Started by: Nicolette Bang, MD   omeprazole 20 MG capsule Commonly known as: PRILOSEC Take 20 mg by mouth 2 (two) times daily.   ondansetron 4 MG disintegrating tablet Commonly known as: ZOFRAN-ODT       Allergies: No Known Allergies  Family History: Family History  Problem Relation Age of Onset  . Heart attack Father        Deceased, age 32s  . Diabetes Brother   . Colon cancer Neg Hx   . Liver disease Neg Hx   . Breast cancer Neg Hx   . Prostate cancer Neg Hx   . Pancreatic cancer Neg Hx     Social History:  reports that he quit smoking about 30 years ago. His smoking use included cigarettes. He has a 50.00 pack-year smoking history. His smokeless tobacco use includes snuff. He reports that he does not drink alcohol and does not use drugs.  ROS: All other review of systems were reviewed and are negative except what is noted above in HPI  Physical Exam: There were no vitals taken for this visit.  Constitutional:  Alert and oriented, No acute distress. HEENT: [place] AT, moist mucus membranes.  Trachea midline, no masses. Cardiovascular: No clubbing, cyanosis, or edema. Respiratory: Normal respiratory effort, no increased work of breathing. GI: Abdomen is soft, nontender, nondistended, no abdominal masses GU: No CVA tenderness.  Lymph: No cervical or inguinal lymphadenopathy. Skin: No rashes, bruises or suspicious lesions. Neurologic: Grossly intact, no focal deficits, moving all 4 extremities. Psychiatric: Normal mood and affect.  Laboratory Data: Lab Results  Component Value Date   WBC 7.2 10/09/2020   HGB 11.5 (L) 10/09/2020   HCT 37.0 (L) 10/09/2020    MCV 103.6 (H) 10/09/2020   PLT 191 10/09/2020    Lab Results  Component Value Date   CREATININE 1.33 (H) 10/09/2020    No results found for: PSA  No results found for: TESTOSTERONE  Lab Results  Component Value Date   HGBA1C (H) 02/08/2011    5.7 (NOTE)                                                                       According to the ADA Clinical Practice Recommendations for 2011, when HbA1c is used as a screening test:   >=6.5%   Diagnostic of Diabetes Mellitus           (if abnormal result  is confirmed)  5.7-6.4%   Increased risk  of developing Diabetes Mellitus  References:Diagnosis and Classification of Diabetes Mellitus,Diabetes VFIE,3329,51(OACZY 1):S62-S69 and Standards of Medical Care in         Diabetes - 2011,Diabetes SAYT,0160,10  (Suppl 1):S11-S61.    Urinalysis    Component Value Date/Time   COLORURINE STRAW (A) 02/22/2020 1929   APPEARANCEUR Clear 09/16/2020 1531   LABSPEC 1.008 02/22/2020 1929   PHURINE 6.0 02/22/2020 1929   GLUCOSEU Negative 09/16/2020 1531   HGBUR NEGATIVE 02/22/2020 1929   BILIRUBINUR Negative 09/16/2020 1531   KETONESUR NEGATIVE 02/22/2020 1929   PROTEINUR 1+ (A) 09/16/2020 1531   PROTEINUR NEGATIVE 02/22/2020 1929   UROBILINOGEN negative (A) 04/07/2020 1426   UROBILINOGEN 0.2 03/23/2012 2041   NITRITE Negative 09/16/2020 1531   NITRITE NEGATIVE 02/22/2020 1929   LEUKOCYTESUR Negative 09/16/2020 1531   LEUKOCYTESUR NEGATIVE 02/22/2020 1929    Lab Results  Component Value Date   LABMICR See below: 09/16/2020   WBCUA None seen 09/16/2020   LABEPIT None seen 09/16/2020   MUCUS Present 09/16/2020   BACTERIA None seen 09/16/2020    Pertinent Imaging:  Results for orders placed during the hospital encounter of 02/07/11  DG Abd 1 View  Narrative *RADIOLOGY REPORT*  Clinical Data: Abdominal pain.  ABDOMEN - 1 VIEW  Comparison: CT 02/07/2011.  Findings: Contrast from previous CT has progressed into the colon. There is  contrast from the previous CT and urinary bladder.  There is no evidence of bowel obstruction.  No opaque calculi are seen. There is slight scoliosis convexity to the left.  IMPRESSION: No acute or active abdominal process is identified.  Original Report Authenticated By: Delane Ginger, M.D.  No results found for this or any previous visit.  No results found for this or any previous visit.  No results found for this or any previous visit.  Results for orders placed during the hospital encounter of 02/22/20  US RENAL  Narrative CLINICAL DATA:  Acute kidney injury.  EXAM: RENAL / URINARY TRACT ULTRASOUND COMPLETE  COMPARISON:  01/17/2020 CT abdomen/pelvis.  FINDINGS: Right Kidney:  Renal measurements: 11.2 x 4.8 x 6.0 cm = volume: 169 mL. Normal renal parenchymal echogenicity and thickness. No hydronephrosis. Multiple simple appearing right renal cysts, largest 7.6 x 5.0 x 5.9 cm in the upper right kidney.  Left Kidney:  Renal measurements: 10.8 x 6.1 x 5.3 cm = volume: 182 mL. Normal renal parenchymal echogenicity and thickness. No hydronephrosis. Multiple simple appearing left renal cysts, largest 4.2 x 3.3 x 4.7 cm in the lateral lower left kidney.  Bladder:  Appears normal for degree of bladder distention. Ureteral jets demonstrated bilaterally in the bladder.  Other:  Enlarged prostate measuring approximately 6.7 x 4.2 x 5.2 cm.  IMPRESSION: 1. No hydronephrosis. 2. Simple bilateral renal cysts. 3. Prostatomegaly. Please see recent 01/17/2020 CT report for further details regarding the prostate.   Electronically Signed By: Ilona Sorrel M.D. On: 02/22/2020 17:31  No results found for this or any previous visit.  No results found for this or any previous visit.  No results found for this or any previous visit.   Assessment & Plan:    1. Prostate cancer ([place]) -PSA today, RTC 3 months with PSA - BLADDER SCAN AMB NON-IMAGING - Urinalysis, Routine w  reflex microscopic - PSA  2. Benign prostatic hyperplasia with urinary obstruction -Continue uroxatral  3. Nocturia We will add mirabegron 25mg  daily. RTC 4-6 weeks   No follow-ups on file.  Nicolette Bang, MD  [facility] [place]

## 2020-10-15 NOTE — Progress Notes (Signed)
Bladder Scan Patient can void: 60 ml Performed By: Lexey Fletes,LPN  Urological Symptom Review  Patient is experiencing the following symptoms: Frequent urination Hard to postpone urination Get up at night to urinate Leakage of urine Trouble starting stream Have to strain to urinate   Review of Systems  Gastrointestinal (upper)  : Negative for upper GI symptoms  Gastrointestinal (lower) : Negative for lower GI symptoms  Constitutional : Negative for symptoms  Skin: Negative for skin symptoms  Eyes: Negative for eye symptoms  Ear/Nose/Throat : Negative for Ear/Nose/Throat symptoms  Hematologic/Lymphatic: Negative for Hematologic/Lymphatic symptoms  Cardiovascular : Negative for cardiovascular symptoms  Respiratory : Shortness of breath  Endocrine: Negative for endocrine symptoms  Musculoskeletal: Joint pain  Neurological: Negative for neurological symptoms  Psychologic: Negative for psychiatric symptoms

## 2020-10-16 ENCOUNTER — Inpatient Hospital Stay (HOSPITAL_COMMUNITY): Payer: Medicare HMO | Attending: Hematology | Admitting: Hematology

## 2020-10-16 VITALS — BP 129/71 | HR 79 | Temp 96.9°F | Resp 18 | Wt 130.2 lb

## 2020-10-16 DIAGNOSIS — K838 Other specified diseases of biliary tract: Secondary | ICD-10-CM | POA: Diagnosis not present

## 2020-10-16 DIAGNOSIS — C180 Malignant neoplasm of cecum: Secondary | ICD-10-CM

## 2020-10-16 DIAGNOSIS — M8580 Other specified disorders of bone density and structure, unspecified site: Secondary | ICD-10-CM | POA: Insufficient documentation

## 2020-10-16 DIAGNOSIS — Z8249 Family history of ischemic heart disease and other diseases of the circulatory system: Secondary | ICD-10-CM | POA: Insufficient documentation

## 2020-10-16 DIAGNOSIS — D631 Anemia in chronic kidney disease: Secondary | ICD-10-CM | POA: Insufficient documentation

## 2020-10-16 DIAGNOSIS — R0602 Shortness of breath: Secondary | ICD-10-CM | POA: Diagnosis not present

## 2020-10-16 DIAGNOSIS — R110 Nausea: Secondary | ICD-10-CM | POA: Insufficient documentation

## 2020-10-16 DIAGNOSIS — Z9049 Acquired absence of other specified parts of digestive tract: Secondary | ICD-10-CM | POA: Insufficient documentation

## 2020-10-16 DIAGNOSIS — I4891 Unspecified atrial fibrillation: Secondary | ICD-10-CM | POA: Diagnosis not present

## 2020-10-16 DIAGNOSIS — D509 Iron deficiency anemia, unspecified: Secondary | ICD-10-CM | POA: Diagnosis not present

## 2020-10-16 DIAGNOSIS — Z85038 Personal history of other malignant neoplasm of large intestine: Secondary | ICD-10-CM | POA: Insufficient documentation

## 2020-10-16 DIAGNOSIS — Z833 Family history of diabetes mellitus: Secondary | ICD-10-CM | POA: Insufficient documentation

## 2020-10-16 DIAGNOSIS — E611 Iron deficiency: Secondary | ICD-10-CM | POA: Insufficient documentation

## 2020-10-16 DIAGNOSIS — I2510 Atherosclerotic heart disease of native coronary artery without angina pectoris: Secondary | ICD-10-CM | POA: Insufficient documentation

## 2020-10-16 DIAGNOSIS — M2550 Pain in unspecified joint: Secondary | ICD-10-CM | POA: Diagnosis not present

## 2020-10-16 DIAGNOSIS — R5383 Other fatigue: Secondary | ICD-10-CM | POA: Insufficient documentation

## 2020-10-16 DIAGNOSIS — I700 Atherosclerosis of aorta: Secondary | ICD-10-CM | POA: Insufficient documentation

## 2020-10-16 DIAGNOSIS — N281 Cyst of kidney, acquired: Secondary | ICD-10-CM | POA: Insufficient documentation

## 2020-10-16 DIAGNOSIS — J90 Pleural effusion, not elsewhere classified: Secondary | ICD-10-CM | POA: Diagnosis not present

## 2020-10-16 DIAGNOSIS — Z87891 Personal history of nicotine dependence: Secondary | ICD-10-CM | POA: Diagnosis not present

## 2020-10-16 DIAGNOSIS — N400 Enlarged prostate without lower urinary tract symptoms: Secondary | ICD-10-CM | POA: Insufficient documentation

## 2020-10-16 DIAGNOSIS — M25559 Pain in unspecified hip: Secondary | ICD-10-CM | POA: Insufficient documentation

## 2020-10-16 DIAGNOSIS — Z8546 Personal history of malignant neoplasm of prostate: Secondary | ICD-10-CM | POA: Insufficient documentation

## 2020-10-16 DIAGNOSIS — N189 Chronic kidney disease, unspecified: Secondary | ICD-10-CM | POA: Diagnosis not present

## 2020-10-16 DIAGNOSIS — C61 Malignant neoplasm of prostate: Secondary | ICD-10-CM | POA: Diagnosis not present

## 2020-10-16 DIAGNOSIS — Z79899 Other long term (current) drug therapy: Secondary | ICD-10-CM | POA: Insufficient documentation

## 2020-10-16 DIAGNOSIS — R0989 Other specified symptoms and signs involving the circulatory and respiratory systems: Secondary | ICD-10-CM | POA: Insufficient documentation

## 2020-10-16 LAB — PSA: Prostate Specific Ag, Serum: 0.1 ng/mL (ref 0.0–4.0)

## 2020-10-16 NOTE — Progress Notes (Signed)
Tony Holt, [place] [id number]   CLINIC:  Medical Oncology/Hematology  PCP:  [name], MD [address] / [place] [address] [phone]   REASON FOR VISIT:  Follow-up for cecal cancer, prostate cancer and IDA  PRIOR THERAPY:  1. Colon resection in 1999. 2. Right hemicolectomy on 02/04/2020.  NGS Results: Not done  CURRENT THERAPY: Observation; intermittent Feraheme last on 03/12/2020  BRIEF ONCOLOGIC HISTORY:  Oncology History  Cecal cancer ([place])  02/04/2020 Initial Diagnosis   Cecal cancer ([place])   03/03/2020 Cancer Staging   Staging form: Colon and Rectum, AJCC 8th Edition - Clinical stage from 03/03/2020: Stage IIB (cT4a, cN0, cM0) - Signed by Derek Jack, MD on 03/03/2020     CANCER STAGING: Cancer Staging Cecal cancer [facility]) Staging form: Colon and Rectum, AJCC 8th Edition - Clinical stage from 03/03/2020: Stage IIB (cT4a, cN0, cM0) - Signed by Derek Jack, MD on 03/03/2020   INTERVAL HISTORY:  Tony Holt, a 86 y.o. male, returns for routine follow-up of his cecal cancer and IDA. Tony Holt was last seen on 07/09/2020.   Today he is accompanied by his daughter and he reports feeling well. According to his daughter, he is complaining of worsening hip pain.   REVIEW OF SYSTEMS:  Review of Systems  Constitutional: Positive for appetite change (50%) and fatigue (25%).  Respiratory: Positive for shortness of breath.   Gastrointestinal: Positive for nausea.  Musculoskeletal: Positive for arthralgias (6/10 hips pain).  All other systems reviewed and are negative.   PAST MEDICAL/SURGICAL HISTORY:  Past Medical History:  Diagnosis Date   BPH (benign prostatic hyperplasia)    Colon cancer ([place]) 1999   Dysrhythmia    new onset A Fib   Essential hypertension    GERD (gastroesophageal reflux disease)    Hard of hearing    Prostate cancer [facility])    Past Surgical History:  Procedure  Laterality Date   BIOPSY  12/28/2019   Procedure: BIOPSY;  Surgeon: Daneil Dolin, MD;  Location: [facility] ENDO SUITE;  Service: Endoscopy;;  gastric, esophagus,cecal   CATARACT EXTRACTION [facility]  01/18/2013   Procedure: CATARACT EXTRACTION PHACO AND INTRAOCULAR LENS PLACEMENT ([place]);  Surgeon: Tonny Branch, MD;  Location: [facility] ORS;  Service: Ophthalmology;  Laterality: Left;  CDE 17.11   CATARACT EXTRACTION W/PHACO Right 02/05/2013   Procedure: CATARACT EXTRACTION PHACO AND INTRAOCULAR LENS PLACEMENT (I[facility]);  Surgeon: Tonny Branch, MD;  Location: [facility] ORS;  Service: Ophthalmology;  Laterality: Right;  CDE:19.49   COLON SURGERY  1999   Sigmoid colon cancer, segmental resection by Drs. Smith/Bradford   COLONOSCOPY  2002   Multiple adenomatous colon polyps   COLONOSCOPY N/A 12/28/2019   Procedure: COLONOSCOPY;  Surgeon: Daneil Dolin, MD;  Location: [facility] ENDO SUITE;  Service: Endoscopy;  Laterality: N/A;   ESOPHAGOGASTRODUODENOSCOPY  03/25/2012   Dr. Gala Romney: Severe ulcerative/erosive reflux esophagitis.  Small hiatal hernia.  Abnormal gastric and duodenal mucosa, biopsy showed reactive changes and minimal chronic inflammation but no H. pylori.   ESOPHAGOGASTRODUODENOSCOPY N/A 12/28/2019   Procedure: ESOPHAGOGASTRODUODENOSCOPY (EGD);  Surgeon: Daneil Dolin, MD;  Location: [facility] ENDO SUITE;  Service: Endoscopy;  Laterality: N/A;   HERNIA REPAIR  02/2011   Right inguinal hernia repair with mesh, Dr. Geroge Baseman   HERNIA REPAIR     three total   PARTIAL COLECTOMY Right 02/04/2020   Procedure: RIGHT HEMI-COLECTOMY;  Surgeon: Virl Cagey, MD;  Location: [facility] ORS;  Service: General;  Laterality:  Right;   POLYPECTOMY  12/28/2019   Procedure: POLYPECTOMY;  Surgeon: Daneil Dolin, MD;  Location: [facility] ENDO SUITE;  Service: Endoscopy;;  rectal     SOCIAL HISTORY:  Social History   Socioeconomic History   Marital status: Widowed    Spouse name: Not on file   Number of children: 5   Years of education:  Not on file   Highest education level: Not on file  Occupational History   Occupation: Retired Neurosurgeon: RETIRED  Tobacco Use   Smoking status: Former Smoker    Packs/day: 1.00    Years: 50.00    Pack years: 50.00    Types: Cigarettes    Quit date: 12/13/1989    Years since quitting: 30.8   Smokeless tobacco: Current User    Types: Snuff  Vaping Use   Vaping Use: Never used  Substance and Sexual Activity   Alcohol use: No    Alcohol/week: 0.0 standard drinks    Comment: previous heavy drinker at age [age]   Drug use: No   Sexual activity: Not Currently  Other Topics Concern   Not on file  Social History Narrative   Not on file   Social Determinants of Health   Financial Resource Strain: Low Risk    Difficulty of Paying Living Expenses: Not hard at all  Food Insecurity: No Food Insecurity   Worried About Charity fundraiser in the Last Year: Never true   [place] in the Last Year: Never true  Transportation Needs: No Transportation Needs   Lack of Transportation (Medical): No   Lack of Transportation (Non-Medical): No  Physical Activity: Insufficiently Active   Days of Exercise per Week: 3 days   Minutes of Exercise per Session: 20 min  Stress: No Stress Concern Present   Feeling of Stress : Only a little  Social Connections: Socially Isolated   Frequency of Communication with Friends and Family: More than three times a week   Frequency of Social Gatherings with Friends and Family: More than three times a week   Attends Religious Services: Never   Marine scientist or Organizations: No   Attends Archivist Meetings: Never   Marital Status: Widowed  Human resources officer Violence: Not At Risk   Fear of Current or Ex-Partner: No   Emotionally Abused: No   Physically Abused: No   Sexually Abused: No    FAMILY HISTORY:  Family History  Problem Relation Age of Onset   Heart attack Father        Deceased,  age 72s   Diabetes Brother    Colon cancer Neg Hx    Liver disease Neg Hx    Breast cancer Neg Hx    Prostate cancer Neg Hx    Pancreatic cancer Neg Hx     CURRENT MEDICATIONS:  Current Outpatient Medications  Medication Sig Dispense Refill   alfuzosin (UROXATRAL) 10 MG 24 hr tablet Take 1 tablet (10 mg total) by mouth daily with breakfast. 90 tablet 3   Aspirin 81 MG CAPS Take 81 mg by mouth daily.     diphenhydramine-acetaminophen (TYLENOL PM) 25-500 MG TABS tablet Take 2 tablets by mouth at bedtime.      docusate sodium (COLACE) 100 MG capsule Take 1 capsule (100 mg total) by mouth 2 (two) times daily as needed for mild constipation. 60 capsule 1   Ensure (ENSURE) Take 237 mLs by mouth 2 (two) times daily between meals.  furosemide (LASIX) 20 MG tablet      HYDROcodone-acetaminophen (NORCO) 7.5-325 MG tablet      mirabegron ER (MYRBETRIQ) 25 MG TB24 tablet Take 1 tablet (25 mg total) by mouth daily. 30 tablet 0   omeprazole (PRILOSEC) 20 MG capsule Take 20 mg by mouth 2 (two) times daily.  11   ondansetron (ZOFRAN-ODT) 4 MG disintegrating tablet      Pediatric Multiple Vitamins (FLINTSTONES MULTIVITAMIN PO) Take 1 tablet by mouth daily.      Probiotic Product (CULTURELLE PROBIOTICS PO) Take 1 tablet by mouth daily.      No current facility-administered medications for this visit.    ALLERGIES:  No Known Allergies  PHYSICAL EXAM:  Performance status (ECOG): 2 - Symptomatic, <50% confined to bed  Vitals:   10/16/20 1212  BP: 129/71  Pulse: 79  Resp: 18  Temp: (!) 96.9 F (36.1 C)  SpO2: 98%   Wt Readings from Last 3 Encounters:  10/16/20 130 lb 3.2 oz (59.1 kg)  09/04/20 137 lb 9.1 oz (62.4 kg)  07/09/20 131 lb 9.6 oz (59.7 kg)   Physical Exam Vitals reviewed.  Constitutional:      Appearance: Normal appearance.     Comments: Wheelchair-bound  Cardiovascular:     Rate and Rhythm: Normal rate and regular rhythm.     Pulses: Normal pulses.       Heart sounds: Normal heart sounds.  Pulmonary:     Effort: Pulmonary effort is normal.     Breath sounds: Normal breath sounds.  Neurological:     General: No focal deficit present.     Mental Status: He is alert and oriented to person, place, and time.  Psychiatric:        Mood and Affect: Mood normal.        Behavior: Behavior normal.      LABORATORY DATA:  I have reviewed the labs as listed.  CBC Latest Ref Rng & Units 10/09/2020 10/01/2020 09/04/2020  WBC 4.0 - 10.5 K/uL 7.2 5.9 7.2  Hemoglobin 13.0 - 17.0 g/dL 11.5(L) 10.5(L) 9.1(L)  Hematocrit 39 - 52 % 37.0(L) 34.5(L) 30.0(L)  Platelets 150 - 400 K/uL 191 149(L) 175   CMP Latest Ref Rng & Units 10/09/2020 10/01/2020 09/04/2020  Glucose 70 - 99 mg/dL 101(H) 100(H) 95  BUN 8 - 23 mg/dL 17 14 43(H)  Creatinine 0.61 - 1.24 mg/dL 1.33(H) 1.27(H) 1.41(H)  Sodium 135 - 145 mmol/L 139 141 139  Potassium 3.5 - 5.1 mmol/L 4.1 4.3 4.8  Chloride 98 - 111 mmol/L 103 106 112(H)  CO2 22 - 32 mmol/L 27 25 17(L)  Calcium 8.9 - 10.3 mg/dL 9.3 9.4 9.0  Total Protein 6.5 - 8.1 g/dL 6.6 6.9 -  Total Bilirubin 0.3 - 1.2 mg/dL 0.7 0.5 -  Alkaline Phos 38 - 126 U/L 67 62 -  AST 15 - 41 U/L 17 11(L) -  ALT 0 - 44 U/L 17 <5 -   Lab Results  Component Value Date   CEA1 2.1 10/09/2020   CEA1 3.3 07/02/2020   CEA1 2.8 04/03/2020  No results found for: PSA   DIAGNOSTIC IMAGING:  I have independently reviewed the scans and discussed with the patient. DG Chest 2 View  Result Date: 10/01/2020 CLINICAL DATA:  Shortness of breath. EXAM: CHEST - 2 VIEW COMPARISON:  Chest radiograph 09/01/2020 FINDINGS: Improved airspace opacities in the right upper lobe. New left basilar opacities. Small bilateral pleural effusions. No discernible pneumothorax. Pulmonary vascular congestion. Stable cardiomediastinal  silhouette. Aortic atherosclerosis. IMPRESSION: 1. New left basilar opacities, which may represent atelectasis, aspiration, and/or pneumonia. 2.  Pulmonary vascular congestion with small bilateral pleural effusions. Electronically Signed   By: Margaretha Sheffield MD   On: 10/01/2020 11:57   CT Abdomen Pelvis W Contrast  Result Date: 10/09/2020 CLINICAL DATA:  Colon cancer staging, status post right colectomy EXAM: CT ABDOMEN AND PELVIS WITH CONTRAST TECHNIQUE: Multidetector CT imaging of the abdomen and pelvis was performed using the standard protocol following bolus administration of intravenous contrast. CONTRAST:  171m OMNIPAQUE IOHEXOL 300 MG/ML  SOLN COMPARISON:  CT abdomen pelvis, 01/17/2020, CT pelvis, 07/28/2020 FINDINGS: Lower chest: Small bilateral pleural effusions and associated atelectasis or consolidation. Cardiomegaly. Three-vessel coronary artery calcifications. Unchanged trace pericardial effusion. Hepatobiliary: No solid liver abnormality is seen. No gallstones. Unchanged mild biliary ductal dilatation, common bile duct measuring up to 1.2 cm. Pancreas: Unremarkable. No pancreatic ductal dilatation or surrounding inflammatory changes. Spleen: Normal in size without significant abnormality. Adrenals/Urinary Tract: Adrenal glands are unremarkable. Multiple bilateral renal cysts. Kidneys are otherwise normal, without renal calculi, solid lesion, or hydronephrosis. Bladder is unremarkable. Stomach/Bowel: Stomach is within normal limits. Interval postoperative findings of right colectomy and ileal reanastomosis. Additional pre-existing transverse colon resection unchanged compared to prior examination. Vascular/Lymphatic: Aortic atherosclerosis. No enlarged abdominal or pelvic lymph nodes. Reproductive: A previously noted right eccentric mass centered around the base of the prostate and urinary bladder is not well appreciated on current examination (series 2, image 69) Other: Status post bilateral inguinal hernia repair. No abdominopelvic ascites. Musculoskeletal: Osteopenia. Multiple minimally displaced fractures of the right pubic symphysis  (series 2, image 72), left superior pubic ramus (series 2, image 71) and bilateral inferior pubic rami (series 2, image 79). Probable fractures of the right sacral ala (series 2, image 58). Unchanged wedge deformity of the T12 vertebral body. IMPRESSION: 1. Interval postoperative findings of right colectomy and ileal reanastomosis. Additional pre-existing transverse colon resection unchanged compared to prior examination. No evidence of recurrent or metastatic disease in the abdomen or pelvis. 2. A previously noted right eccentric mass centered around the base of the prostate and urinary bladder is not well appreciated on current examination, likely resected or treated in the interval. 3. Multiple minimally displaced fractures of the pelvis and likely of the right hemisacrum as detailed above, which are of varying ages, some of which are new compared to prior CT dated 07/28/2020, all new compared to CT dated 01/17/2020. Correlate for recent trauma. 4. Unchanged biliary ductal dilatation to ampulla, common bile duct measuring up to 1.2 cm. No calculi or other obstructing etiology identified. 5. Small bilateral pleural effusions and associated atelectasis or consolidation. 6. Cardiomegaly and coronary artery disease. Unchanged trace pericardial effusion. 7. Aortic Atherosclerosis (ICD10-I70.0). Electronically Signed   By: AEddie CandleM.D.   On: 10/09/2020 11:35   XR HIP UNILAT W OR W/O PELVIS 1V RIGHT  Result Date: 10/01/2020 A single AP the pelvis was obtained demonstrating a fracture of the superior pubic rami at the pubic junction.  There may be a nondisplaced fracture at the inferior pubic rami.  Also a possibility of a nondisplaced fracture of the left inferior pubic rami.  No pathology about the right hip.  Diffuse bony demineralization.  Some degenerative changes on the limited view of the AP spine in the AP projection    ASSESSMENT:  1. Stage IIb (PT4APN0) cecal cancer: -Right hemicolectomy on  02/04/2020. PT4APN0, MMR preserved. 0/19 lymph nodes involved. Margins negative. Grade 2. -He also  had colon cancer in 1999 and underwent surgery without any need for chemotherapy. -We reviewed his labs. CEA is within normal limits. -CT CAP on 01/17/2020 showed irregular wall thickening involving the medial wall of the cecum extending into the terminal ileum. No evidence of metastatic disease. Asymmetric mass involving the right lobe of the prostate gland with extension into the right seminal vesicle and bladder base suspicious for prostate cancer. Stable biliary dilation and bilateral renal cysts. Age-indeterminate T12 compression deformity. -CT shows postoperative changes of right colectomy and ileal reanastomosis.  No evidence of recurrence or metastatic disease.  2. Iron deficiency state: -He has normocytic anemia, combination from CKD and relative iron deficiency. -Feraheme on 03/12/2020.  3.  T4a adenocarcinoma the prostate: -Gleason score of 5+4, PSA of 133. -Currently receiving ADT.   PLAN:  1.  Stage IIb (PT4APN0) cecal cancer: -We have reviewed his labs.  CEA was normal. -Reviewed results of CTAP which did not show any evidence of recurrence or metastatic disease in the abdomen or pelvis from colon cancer. -RTC 3 months with repeat labs and CEA.  2.  T4a adenocarcinoma of the prostate: -PSA has trended down to 0.1. -Previously noted right eccentric mass centered around the base of the prostate and urinary bladder is not well appreciated. -RTC 3 months with PSA.  3.  Macrocytic anemia: -Hemoglobin is 11.5 with MCV of 103.  Will repeat CBC at next visit.  4.  CKD: -Creatinine stable around 1.26.  5.  Pelvic pain: -CT scan showed multiple minimally displaced fractures of the pelvis, and likely of the right hemisacrum of varying ages.  Some are new compared to previous CT from 07/28/2020. -He will follow up with his orthopedics.   Orders placed this encounter:   Orders Placed This Encounter  Procedures   CBC with Differential/Platelet   CEA   Comprehensive metabolic panel   PSA     Derek Jack, MD [place] [phone]   I, Milinda Antis, am acting as a scribe for Dr. Sanda Linger.  I, Derek Jack MD, have reviewed the above documentation for accuracy and completeness, and I agree with the above.

## 2020-10-16 NOTE — Patient Instructions (Signed)
[place] at [facility] Discharge Instructions  You were seen today by Dr. Delton Coombes. He went over your recent results and scans. Your next appointment will be with the nurse practitioner in 3 months for labs and follow up.   Thank you for choosing [place] at [facility] to provide your oncology and hematology care.  To afford each patient quality time with our provider, please arrive at least 15 minutes before your scheduled appointment time.   If you have a lab appointment with the [facility] please come in thru the Main Entrance and check in at the main information desk  You need to re-schedule your appointment should you arrive 10 or more minutes late.  We strive to give you quality time with our providers, and arriving late affects you and other patients whose appointments are after yours.  Also, if you no show three or more times for appointments you may be dismissed from the clinic at the providers discretion.     Again, thank you for choosing [facility].  Our hope is that these requests will decrease the amount of time that you wait before being seen by our physicians.       _____________________________________________________________  Should you have questions after your visit to [facility], please contact our office at [phone] between the hours of 8:00 a.m. and 4:30 p.m.  Voicemails left after 4:00 p.m. will not be returned until the following business day.  For prescription refill requests, have your pharmacy contact our office and allow 72 hours.    Cancer Center Support Programs:   > Cancer Support Group  2nd Tuesday of the month 1pm-2pm, Journey Room

## 2020-10-21 DIAGNOSIS — Z9889 Other specified postprocedural states: Secondary | ICD-10-CM | POA: Diagnosis not present

## 2020-10-21 DIAGNOSIS — C61 Malignant neoplasm of prostate: Secondary | ICD-10-CM | POA: Diagnosis not present

## 2020-10-21 DIAGNOSIS — K219 Gastro-esophageal reflux disease without esophagitis: Secondary | ICD-10-CM | POA: Diagnosis not present

## 2020-10-21 DIAGNOSIS — Z85038 Personal history of other malignant neoplasm of large intestine: Secondary | ICD-10-CM | POA: Diagnosis not present

## 2020-10-23 NOTE — Progress Notes (Signed)
Sent via my chart and letter mailed

## 2020-10-29 ENCOUNTER — Ambulatory Visit (HOSPITAL_COMMUNITY)
Admission: RE | Admit: 2020-10-29 | Discharge: 2020-10-29 | Disposition: A | Discharge status: Home / Self Care | Payer: Medicare HMO | Source: Ambulatory Visit | Attending: Nurse Practitioner | Admitting: Nurse Practitioner

## 2020-10-29 ENCOUNTER — Other Ambulatory Visit (HOSPITAL_COMMUNITY): Payer: Self-pay | Admitting: Nurse Practitioner

## 2020-10-29 ENCOUNTER — Ambulatory Visit: Payer: Medicare HMO | Admitting: Orthopaedic Surgery

## 2020-10-29 ENCOUNTER — Other Ambulatory Visit: Payer: Self-pay

## 2020-10-29 DIAGNOSIS — J189 Pneumonia, unspecified organism: Secondary | ICD-10-CM | POA: Diagnosis not present

## 2020-10-29 DIAGNOSIS — R0602 Shortness of breath: Secondary | ICD-10-CM | POA: Diagnosis not present

## 2020-11-13 ENCOUNTER — Ambulatory Visit (INDEPENDENT_AMBULATORY_CARE_PROVIDER_SITE_OTHER): Payer: Medicare HMO | Admitting: Urology

## 2020-11-13 ENCOUNTER — Encounter: Payer: Self-pay | Admitting: Urology

## 2020-11-13 ENCOUNTER — Other Ambulatory Visit: Payer: Self-pay

## 2020-11-13 VITALS — BP 117/70 | HR 102 | Temp 97.7°F | Ht 68.5 in | Wt 130.0 lb

## 2020-11-13 DIAGNOSIS — C61 Malignant neoplasm of prostate: Secondary | ICD-10-CM | POA: Diagnosis not present

## 2020-11-13 DIAGNOSIS — N138 Other obstructive and reflux uropathy: Secondary | ICD-10-CM | POA: Diagnosis not present

## 2020-11-13 DIAGNOSIS — R351 Nocturia: Secondary | ICD-10-CM

## 2020-11-13 DIAGNOSIS — N401 Enlarged prostate with lower urinary tract symptoms: Secondary | ICD-10-CM

## 2020-11-13 LAB — URINALYSIS, ROUTINE W REFLEX MICROSCOPIC
Bilirubin, UA: NEGATIVE
Glucose, UA: NEGATIVE
Ketones, UA: NEGATIVE
Leukocytes,UA: NEGATIVE
Nitrite, UA: NEGATIVE
Specific Gravity, UA: 1.02 (ref 1.005–1.030)
Urobilinogen, Ur: 0.2 mg/dL (ref 0.2–1.0)
pH, UA: 5 (ref 5.0–7.5)

## 2020-11-13 LAB — MICROSCOPIC EXAMINATION: Renal Epithel, UA: NONE SEEN /HPF

## 2020-11-13 LAB — BLADDER SCAN AMB NON-IMAGING: Scan Result: 18

## 2020-11-13 MED ORDER — GEMTESA 75 MG PO TABS: 1.0000 | ORAL_TABLET | Freq: Every day | ORAL | 0 refills | Status: DC

## 2020-11-13 MED ORDER — TAMSULOSIN HCL 0.4 MG PO CAPS: 0.4000 mg | ORAL_CAPSULE | Freq: Two times a day (BID) | ORAL | 11 refills | Status: DC

## 2020-11-13 MED ORDER — MIRABEGRON ER 50 MG PO TB24: 50.0000 mg | ORAL_TABLET | Freq: Every day | ORAL | 11 refills | Status: DC

## 2020-11-13 NOTE — Progress Notes (Signed)
11/13/2020 11:41 AM   Tony Holt August 13, 1926 [id number]  Referring provider: [name], MD [address] [place],  [place] [address]  Followup nocturia  HPI: Mr Sackmann is a 86yo here for followup for BPH and nocturia. Last visit he was started on mirabegron and he noted improvement in his urinary frequency during they day and improvement in nocturia to 3x. Stream fair on flomax BID. No other complaints   PMH: Past Medical History:  Diagnosis Date  . BPH (benign prostatic hyperplasia)   . Colon cancer ([place]) 1999  . Dysrhythmia    new onset A Fib  . Essential hypertension   . GERD (gastroesophageal reflux disease)   . Hard of hearing   . Prostate cancer [facility])     Surgical History: Past Surgical History:  Procedure Laterality Date  . BIOPSY  12/28/2019   Procedure: BIOPSY;  Surgeon: Daneil Dolin, MD;  Location: [facility];  Service: Endoscopy;;  gastric, esophagus,cecal  . CATARACT EXTRACTION W/PHACO  01/18/2013   Procedure: CATARACT EXTRACTION PHACO AND INTRAOCULAR LENS PLACEMENT ([place]);  Surgeon: Tonny Branch, MD;  Location: [facility];  Service: Ophthalmology;  Laterality: Left;  CDE 17.11  . CATARACT EXTRACTION W/PHACO Right 02/05/2013   Procedure: CATARACT EXTRACTION PHACO AND INTRAOCULAR LENS PLACEMENT ([facility]);  Surgeon: Tonny Branch, MD;  Location: [facility];  Service: Ophthalmology;  Laterality: Right;  CDE:19.49  . COLON SURGERY  1999   Sigmoid colon cancer, segmental resection by Drs. Smith/Bradford  . COLONOSCOPY  2002   Multiple adenomatous colon polyps  . COLONOSCOPY N/A 12/28/2019   Procedure: COLONOSCOPY;  Surgeon: Daneil Dolin, MD;  Location: [facility];  Service: Endoscopy;  Laterality: N/A;  . ESOPHAGOGASTRODUODENOSCOPY  03/25/2012   Dr. Gala Romney: Severe ulcerative/erosive reflux esophagitis.  Small hiatal hernia.  Abnormal gastric and duodenal mucosa, biopsy showed reactive changes and minimal chronic inflammation but no H. pylori.  .  ESOPHAGOGASTRODUODENOSCOPY N/A 12/28/2019   Procedure: ESOPHAGOGASTRODUODENOSCOPY (EGD);  Surgeon: Daneil Dolin, MD;  [facility];  Service: Endoscopy;  Laterality: N/A;  . HERNIA REPAIR  02/2011   Right inguinal hernia repair with mesh, Dr. Geroge Baseman  . HERNIA REPAIR     three total  . PARTIAL COLECTOMY Right 02/04/2020   Procedure: RIGHT HEMI-COLECTOMY;  Surgeon: Virl Cagey, MD;  Location: [facility];  Service: General;  Laterality: Right;  . POLYPECTOMY  12/28/2019   Procedure: POLYPECTOMY;  Surgeon: Daneil Dolin, MD;  Location: [facility];  Service: Endoscopy;;  rectal     Home Medications:  Allergies as of 11/13/2020   No Known Allergies     Medication List       Accurate as of November 13, 2020 11:41 AM. If you have any questions, ask your nurse or doctor.        alfuzosin 10 MG 24 hr tablet Commonly known as: UROXATRAL Take 1 tablet (10 mg total) by mouth daily with breakfast.   Aspirin 81 MG Caps Take 81 mg by mouth daily.   CULTURELLE PROBIOTICS PO Take 1 tablet by mouth daily.   diphenhydramine-acetaminophen 25-500 MG Tabs tablet Commonly known as: TYLENOL PM Take 2 tablets by mouth at bedtime.   docusate sodium 100 MG capsule Commonly known as: COLACE Take 1 capsule (100 mg total) by mouth 2 (two) times daily as needed for mild constipation.   Ensure Take 237 mLs by mouth 2 (two) times daily between meals.   FLINTSTONES MULTIVITAMIN PO Take 1 tablet by  mouth daily.   furosemide 20 MG tablet Commonly known as: LASIX   HYDROcodone-acetaminophen 7.5-325 MG tablet Commonly known as: NORCO   mirabegron ER 25 MG Tb24 tablet Commonly known as: MYRBETRIQ Take 1 tablet (25 mg total) by mouth daily.   omeprazole 20 MG capsule Commonly known as: PRILOSEC Take 20 mg by mouth 2 (two) times daily.   ondansetron 4 MG disintegrating tablet Commonly known as: ZOFRAN-ODT       Allergies: No Known Allergies  Family History: Family  History  Problem Relation Age of Onset  . Heart attack Father        Deceased, age 31s  . Diabetes Brother   . Colon cancer Neg Hx   . Liver disease Neg Hx   . Breast cancer Neg Hx   . Prostate cancer Neg Hx   . Pancreatic cancer Neg Hx     Social History:  reports that he quit smoking about 30 years ago. His smoking use included cigarettes. He has a 50.00 pack-year smoking history. His smokeless tobacco use includes snuff. He reports that he does not drink alcohol and does not use drugs.  ROS: All other review of systems were reviewed and are negative except what is noted above in HPI  Physical Exam: BP 117/70   Pulse (!) 102   Temp 97.7 F (36.5 C)   Ht 5' 8.5" (1.74 m)   Wt 130 lb (59 kg)   BMI 19.48 kg/m   Constitutional:  Alert and oriented, No acute distress. HEENT: [place] AT, moist mucus membranes.  Trachea midline, no masses. Cardiovascular: No clubbing, cyanosis, or edema. Respiratory: Normal respiratory effort, no increased work of breathing. GI: Abdomen is soft, nontender, nondistended, no abdominal masses GU: No CVA tenderness.  Lymph: No cervical or inguinal lymphadenopathy. Skin: No rashes, bruises or suspicious lesions. Neurologic: Grossly intact, no focal deficits, moving all 4 extremities. Psychiatric: Normal mood and affect.  Laboratory Data: Lab Results  Component Value Date   WBC 7.2 10/09/2020   HGB 11.5 (L) 10/09/2020   HCT 37.0 (L) 10/09/2020   MCV 103.6 (H) 10/09/2020   PLT 191 10/09/2020    Lab Results  Component Value Date   CREATININE 1.33 (H) 10/09/2020    No results found for: PSA  No results found for: TESTOSTERONE  Lab Results  Component Value Date   HGBA1C (H) 02/08/2011    5.7 (NOTE)                                                                       According to the ADA Clinical Practice Recommendations for 2011, when HbA1c is used as a screening test:   >=6.5%   Diagnostic of Diabetes Mellitus           (if abnormal result   is confirmed)  5.7-6.4%   Increased risk of developing Diabetes Mellitus  References:Diagnosis and Classification of Diabetes Mellitus,Diabetes KYHC,6237,62(GBTDV 1):S62-S69 and Standards of Medical Care in         Diabetes - 2011,Diabetes Care,2011,34  (Suppl 1):S11-S61.    Urinalysis    Component Value Date/Time   COLORURINE STRAW (A) 02/22/2020 1929   APPEARANCEUR Clear 10/15/2020 1038   LABSPEC 1.008 02/22/2020 1929   PHURINE 6.0 02/22/2020 1929  GLUCOSEU Negative 10/15/2020 [place] 02/22/2020 1929   BILIRUBINUR Negative 10/15/2020 [place] 02/22/2020 1929   PROTEINUR Negative 10/15/2020 [place] 02/22/2020 1929   UROBILINOGEN negative (A) 04/07/2020 1426   UROBILINOGEN 0.2 03/23/2012 2041   NITRITE Negative 10/15/2020 1038   NITRITE NEGATIVE 02/22/2020 1929   LEUKOCYTESUR Negative 10/15/2020 1038   LEUKOCYTESUR NEGATIVE 02/22/2020 1929    Lab Results  Component Value Date   LABMICR See below: 10/15/2020   WBCUA None seen 10/15/2020   LABEPIT None seen 10/15/2020   MUCUS Present 09/16/2020   BACTERIA None seen 10/15/2020    Pertinent Imaging:  Results for orders placed during the hospital encounter of 02/07/11  DG Abd 1 View  Narrative *RADIOLOGY REPORT*  Clinical Data: Abdominal pain.  ABDOMEN - 1 VIEW  Comparison: CT 02/07/2011.  Findings: Contrast from previous CT has progressed into the colon. There is contrast from the previous CT and urinary bladder.  There is no evidence of bowel obstruction.  No opaque calculi are seen. There is slight scoliosis convexity to the left.  IMPRESSION: No acute or active abdominal process is identified.  Original Report Authenticated By: Delane Ginger, M.D.  No results found for this or any previous visit.  No results found for this or any previous visit.  No results found for this or any previous visit.  Results for orders placed during the hospital encounter of  02/22/20  US RENAL  Narrative CLINICAL DATA:  Acute kidney injury.  EXAM: RENAL / URINARY TRACT ULTRASOUND COMPLETE  COMPARISON:  01/17/2020 CT abdomen/pelvis.  FINDINGS: Right Kidney:  Renal measurements: 11.2 x 4.8 x 6.0 cm = volume: 169 mL. Normal renal parenchymal echogenicity and thickness. No hydronephrosis. Multiple simple appearing right renal cysts, largest 7.6 x 5.0 x 5.9 cm in the upper right kidney.  Left Kidney:  Renal measurements: 10.8 x 6.1 x 5.3 cm = volume: 182 mL. Normal renal parenchymal echogenicity and thickness. No hydronephrosis. Multiple simple appearing left renal cysts, largest 4.2 x 3.3 x 4.7 cm in the lateral lower left kidney.  Bladder:  Appears normal for degree of bladder distention. Ureteral jets demonstrated bilaterally in the bladder.  Other:  Enlarged prostate measuring approximately 6.7 x 4.2 x 5.2 cm.  IMPRESSION: 1. No hydronephrosis. 2. Simple bilateral renal cysts. 3. Prostatomegaly. Please see recent 01/17/2020 CT report for further details regarding the prostate.   Electronically Signed By: Ilona Sorrel M.D. On: 02/22/2020 17:31  No results found for this or any previous visit.  No results found for this or any previous visit.  No results found for this or any previous visit.   Assessment & Plan:    1. Prostate cancer ([place]) -RTC 3 motnhs with PSA - Urinalysis, Routine w reflex microscopic - Bladder Scan (Post Void Residual) in office  2. Nocturia -continue flomax 0.4mg  BID  3. Benign prostatic hyperplasia with urinary obstruction We will trial mirabegron 50mg  daily   No follow-ups on file.  Nicolette Bang, MD  [facility] [place]

## 2020-11-13 NOTE — Patient Instructions (Signed)

## 2020-11-13 NOTE — Progress Notes (Signed)

## 2020-11-14 ENCOUNTER — Other Ambulatory Visit: Payer: Self-pay

## 2020-11-14 ENCOUNTER — Emergency Department (HOSPITAL_COMMUNITY)
Admission: EM | Admit: 2020-11-14 | Discharge: 2020-11-15 | Disposition: A | Discharge status: Home / Self Care | Payer: Medicare HMO | Attending: Emergency Medicine | Admitting: Emergency Medicine

## 2020-11-14 ENCOUNTER — Emergency Department (HOSPITAL_COMMUNITY): Payer: Medicare HMO

## 2020-11-14 ENCOUNTER — Encounter (HOSPITAL_COMMUNITY): Payer: Self-pay

## 2020-11-14 DIAGNOSIS — I129 Hypertensive chronic kidney disease with stage 1 through stage 4 chronic kidney disease, or unspecified chronic kidney disease: Secondary | ICD-10-CM | POA: Insufficient documentation

## 2020-11-14 DIAGNOSIS — Z87891 Personal history of nicotine dependence: Secondary | ICD-10-CM | POA: Insufficient documentation

## 2020-11-14 DIAGNOSIS — N1831 Chronic kidney disease, stage 3a: Secondary | ICD-10-CM | POA: Insufficient documentation

## 2020-11-14 DIAGNOSIS — Z8546 Personal history of malignant neoplasm of prostate: Secondary | ICD-10-CM | POA: Diagnosis not present

## 2020-11-14 DIAGNOSIS — Z79899 Other long term (current) drug therapy: Secondary | ICD-10-CM | POA: Diagnosis not present

## 2020-11-14 DIAGNOSIS — N179 Acute kidney failure, unspecified: Secondary | ICD-10-CM

## 2020-11-14 DIAGNOSIS — E860 Dehydration: Secondary | ICD-10-CM | POA: Diagnosis not present

## 2020-11-14 DIAGNOSIS — R000 Tachycardia, unspecified: Secondary | ICD-10-CM | POA: Diagnosis not present

## 2020-11-14 DIAGNOSIS — U071 COVID-19: Secondary | ICD-10-CM | POA: Insufficient documentation

## 2020-11-14 DIAGNOSIS — Z7982 Long term (current) use of aspirin: Secondary | ICD-10-CM | POA: Diagnosis not present

## 2020-11-14 DIAGNOSIS — Z85038 Personal history of other malignant neoplasm of large intestine: Secondary | ICD-10-CM | POA: Insufficient documentation

## 2020-11-14 DIAGNOSIS — Z8616 Personal history of COVID-19: Secondary | ICD-10-CM | POA: Diagnosis not present

## 2020-11-14 DIAGNOSIS — R5383 Other fatigue: Secondary | ICD-10-CM | POA: Diagnosis present

## 2020-11-14 DIAGNOSIS — R0602 Shortness of breath: Secondary | ICD-10-CM | POA: Diagnosis not present

## 2020-11-14 DIAGNOSIS — I4891 Unspecified atrial fibrillation: Secondary | ICD-10-CM | POA: Diagnosis not present

## 2020-11-14 DIAGNOSIS — N170 Acute kidney failure with tubular necrosis: Secondary | ICD-10-CM | POA: Insufficient documentation

## 2020-11-14 DIAGNOSIS — R531 Weakness: Secondary | ICD-10-CM | POA: Diagnosis not present

## 2020-11-14 DIAGNOSIS — Z23 Encounter for immunization: Secondary | ICD-10-CM | POA: Insufficient documentation

## 2020-11-14 LAB — BASIC METABOLIC PANEL
Anion gap: 12 (ref 5–15)
BUN: 48 mg/dL — ABNORMAL HIGH (ref 8–23)
CO2: 18 mmol/L — ABNORMAL LOW (ref 22–32)
Calcium: 9.2 mg/dL (ref 8.9–10.3)
Chloride: 105 mmol/L (ref 98–111)
Creatinine, Ser: 2.22 mg/dL — ABNORMAL HIGH (ref 0.61–1.24)
GFR, Estimated: 27 mL/min — ABNORMAL LOW (ref >60)
Glucose, Bld: 111 mg/dL — ABNORMAL HIGH (ref 70–99)
Potassium: 5.2 mmol/L — ABNORMAL HIGH (ref 3.5–5.1)
Sodium: 135 mmol/L (ref 135–145)

## 2020-11-14 LAB — HEPATIC FUNCTION PANEL
ALT: 23 U/L (ref 0–44)
AST: 28 U/L (ref 15–41)
Albumin: 3.8 g/dL (ref 3.5–5.0)
Alkaline Phosphatase: 79 U/L (ref 38–126)
Bilirubin, Direct: 0.3 mg/dL — ABNORMAL HIGH (ref 0.0–0.2)
Indirect Bilirubin: 0.4 mg/dL (ref 0.3–0.9)
Total Bilirubin: 0.7 mg/dL (ref 0.3–1.2)
Total Protein: 6.9 g/dL (ref 6.5–8.1)

## 2020-11-14 LAB — URINALYSIS, ROUTINE W REFLEX MICROSCOPIC
Glucose, UA: NEGATIVE mg/dL
Hgb urine dipstick: NEGATIVE
Ketones, ur: NEGATIVE mg/dL
Leukocytes,Ua: NEGATIVE
Nitrite: NEGATIVE
Protein, ur: NEGATIVE mg/dL
Specific Gravity, Urine: 1.023 (ref 1.005–1.030)
pH: 5 (ref 5.0–8.0)

## 2020-11-14 LAB — CBC
HCT: 36.3 % — ABNORMAL LOW (ref 39.0–52.0)
Hemoglobin: 11.6 g/dL — ABNORMAL LOW (ref 13.0–17.0)
MCH: 31.5 pg (ref 26.0–34.0)
MCHC: 32 g/dL (ref 30.0–36.0)
MCV: 98.6 fL (ref 80.0–100.0)
Platelets: 144 10*3/uL — ABNORMAL LOW (ref 150–400)
RBC: 3.68 MIL/uL — ABNORMAL LOW (ref 4.22–5.81)
RDW: 13.4 % (ref 11.5–15.5)
WBC: 7.5 10*3/uL (ref 4.0–10.5)
nRBC: 0 % (ref 0.0–0.2)

## 2020-11-14 LAB — LACTIC ACID, PLASMA: Lactic Acid, Venous: 1.3 mmol/L (ref 0.5–1.9)

## 2020-11-14 MED ORDER — FAMOTIDINE IN NACL 20-0.9 MG/50ML-% IV SOLN: 20.0000 mg | Freq: Once | INTRAVENOUS | Status: DC | PRN

## 2020-11-14 MED ORDER — METHYLPREDNISOLONE SODIUM SUCC 125 MG IJ SOLR: 125.0000 mg | Freq: Once | INTRAMUSCULAR | Status: DC | PRN

## 2020-11-14 MED ORDER — SODIUM CHLORIDE 0.9 % IV BOLUS
1000.0000 mL | Freq: Once | INTRAVENOUS | Status: AC
  Administered 2020-11-15: 1000 mL via INTRAVENOUS

## 2020-11-14 MED ORDER — ALBUTEROL SULFATE HFA 108 (90 BASE) MCG/ACT IN AERS: 2.0000 | INHALATION_SPRAY | Freq: Once | RESPIRATORY_TRACT | Status: DC | PRN

## 2020-11-14 MED ORDER — SODIUM CHLORIDE 0.9 % IV SOLN: INTRAVENOUS | Status: DC | PRN

## 2020-11-14 MED ORDER — EPINEPHRINE 0.3 MG/0.3ML IJ SOAJ: 0.3000 mg | Freq: Once | INTRAMUSCULAR | Status: DC | PRN

## 2020-11-14 MED ORDER — SODIUM CHLORIDE 0.9 % IV SOLN
Freq: Once | INTRAVENOUS | Status: AC
  Filled 2020-11-14: qty 20

## 2020-11-14 MED ORDER — DIPHENHYDRAMINE HCL 50 MG/ML IJ SOLN: 50.0000 mg | Freq: Once | INTRAMUSCULAR | Status: DC | PRN

## 2020-11-14 NOTE — ED Triage Notes (Signed)
Pt tested positive for COVID 2 days ago, c.o worsening sob, nad noted at this time. Daughter also reports pt fell 2 months ago with right hip fracture. Pt alert

## 2020-11-14 NOTE — Discharge Instructions (Addendum)
With your new diagnosis of COVID-19 several days ago and your dehydration with mild acute kidney injury similar to prior, we did discuss admission however you would rather go home given your reassuring vital signs.  This was felt to be reasonable and you understood strict return precautions.  You were given the monoclonal antibody treatment to help prevent your coronavirus worsening.  Please rest and stay hydrated.  Please follow-up with your primary doctor.  If any symptoms change or worsen, please return to the nearest emergency department for likely admission for rehydration in the setting of the kidney injury.

## 2020-11-14 NOTE — ED Provider Notes (Signed)
[facility] EMERGENCY DEPARTMENT Provider Note   CSN: [id number] Arrival date & time: 11/14/20  1254     History Chief Complaint  Patient presents with  . Covid Positive  . Shortness of Breath    Tony Holt is a 86 y.o. male.  The history is provided by the patient and medical records. No language interpreter was used.  Illness Location:  Generalized fatigue Quality:  Moderate Severity:  Moderate Onset quality:  Gradual Duration:  3 days Timing:  Constant Progression:  Waxing and waning Chronicity:  New Associated symptoms: congestion, cough and shortness of breath   Associated symptoms: no abdominal pain, no chest pain, no diarrhea, no fatigue, no fever, no headaches, no loss of consciousness, no nausea, no rhinorrhea, no vomiting and no wheezing        Past Medical History:  Diagnosis Date  . BPH (benign prostatic hyperplasia)   . Colon cancer ([place]) 1999  . Dysrhythmia    new onset A Fib  . Essential hypertension   . GERD (gastroesophageal reflux disease)   . Hard of hearing   . Prostate cancer [facility])     Patient Active Problem List   Diagnosis Date Noted  . Benign prostatic hyperplasia with urinary obstruction 10/15/2020  . Nocturia 10/15/2020  . Pelvis fracture, right, closed, initial encounter ([place]) 10/01/2020  . Lobar pneumonia ([place]) 09/03/2020  . Acute respiratory failure with hypoxia ([place]) 09/03/2020  . Acute renal failure superimposed on stage 3a chronic kidney disease ([place]) 09/03/2020  . Respiratory failure with hypoxia ([place]) 09/02/2020  . Community acquired pneumonia   . Elevated PSA 04/28/2020  . AKI (acute kidney injury) ([place]) 02/23/2020  . Prostate cancer ([place]) 02/22/2020  . Atrial fibrillation, chronic ([place]) 02/22/2020  . Cecal cancer ([place])   . IDA (iron deficiency anemia) 05/17/2019  . Constipation 05/17/2019  . Bradycardia 05/28/2016  . Weakness 05/28/2016  . Acute renal failure ([place]) 05/28/2016  . Hyperkalemia  05/28/2016  . Nausea alone 03/24/2012  . Abdominal pain, epigastric 03/24/2012  . HTN (hypertension) 03/24/2012  . ANEMIA 09/16/2010  . HEMOCCULT POSITIVE STOOL 09/16/2010    Past Surgical History:  Procedure Laterality Date  . BIOPSY  12/28/2019   Procedure: BIOPSY;  Surgeon: Daneil Dolin, MD;  Location: [facility] END[facility] SU[facility];  Service: Endoscopy;;  gastric, esophagus,cecal  . CATARACT EXTRACTION W/PHACO  01/18/2013   Procedure: CATARACT EXTRACTION PHACO AND INTRAOCULAR LENS PLACEMENT ([place]);  Surgeon: Tonny Branch, MD;  Location: [facility] ORS;  Service: Ophthalmology;  Laterality: Left;  CDE 17.11  . CATARACT EXTRACTION W/PHACO Right 02/05/2013   Procedure: CATARACT EXTRACTION PHACO AND INTRAOCULAR LENS PLACEMENT (I[facility]);  Surgeon: Tonny Branch, MD;  Location: [facility] ORS;  Service: Ophthalmology;  Laterality: Right;  CDE:19.49  . COLON SURGERY  1999   Sigmoid colon cancer, segmental resection by Drs. Smith/Bradford  . COLONOSCOPY  2002   Multiple adenomatous colon polyps  . COLONOSCOPY N/A 12/28/2019   Procedure: COLONOSCOPY;  Surgeon: Daneil Dolin, MD;  Location: [facility] END[facility];  Service: Endoscopy;  Laterality: N/A;  . ESOPHAGOGASTRODUODENOSCOPY  03/25/2012   Dr. Gala Romney: Severe ulcerative/erosive reflux esophagitis.  Small hiatal hernia.  Abnormal gastric and duodenal mucosa, biopsy showed reactive changes and minimal chronic inflammation but no H. pylori.  . ESOPHAGOGASTRODUODENOSCOPY N/A 12/28/2019   Procedure: ESOPHAGOGASTRODUODENOSCOPY (EGD);  Surgeon: Daneil Dolin, MD;  Location: [facility] END[facility];  Service: Endoscopy;  Laterality: N/A;  . HERNIA REPAIR  02/2011   Right inguinal hernia repair with mesh, Dr.  Geroge Baseman  . HERNIA REPAIR     three total  . PARTIAL COLECTOMY Right 02/04/2020   Procedure: RIGHT HEMI-COLECTOMY;  Surgeon: Virl Cagey, MD;  Location: [facility] ORS;  Service: General;  Laterality: Right;  . POLYPECTOMY  12/28/2019   Procedure: POLYPECTOMY;  Surgeon: Daneil Dolin, MD;  Location: [facility]  ENDO SUITE;  Service: Endoscopy;;  rectal        Family History  Problem Relation Age of Onset  . Heart attack Father        Deceased, age 16s  . Diabetes Brother   . Colon cancer Neg Hx   . Liver disease Neg Hx   . Breast cancer Neg Hx   . Prostate cancer Neg Hx   . Pancreatic cancer Neg Hx     Social History   Tobacco Use  . Smoking status: Former Smoker    Packs/day: 1.00    Years: 50.00    Pack years: 50.00    Types: Cigarettes    Quit date: 12/13/1989    Years since quitting: 30.9  . Smokeless tobacco: Current User    Types: Snuff  Vaping Use  . Vaping Use: Never used  Substance Use Topics  . Alcohol use: No    Alcohol/week: 0.0 standard drinks    Comment: previous heavy drinker at age 19-32  . Drug use: No    Home Medications Prior to Admission medications   Medication Sig Start Date End Date Taking? Authorizing Provider  Aspirin 81 MG CAPS Take 81 mg by mouth daily. 09/04/20   Orson Eva, MD  diphenhydramine-acetaminophen (TYLENOL PM) 25-500 MG TABS tablet Take 2 tablets by mouth at bedtime.     [provider]  docusate sodium (COLACE) 100 MG capsule Take 1 capsule (100 mg total) by mouth 2 (two) times daily as needed for mild constipation. 02/07/20   Virl Cagey, MD  Ensure (ENSURE) Take 237 mLs by mouth 2 (two) times daily between meals.     [provider]  furosemide (LASIX) 20 MG tablet  10/01/20   [provider]  HYDROcodone-acetaminophen ([place]) 7.5-325 MG tablet  10/01/20   [provider]  mirabegron ER (MYRBETRIQ) 50 MG TB24 tablet Take 1 tablet (50 mg total) by mouth daily. 11/13/20   McKenzie, Candee Furbish, MD  omeprazole (PRILOSEC) 20 MG capsule Take 20 mg by mouth 2 (two) times daily. 05/09/16   [provider]  ondansetron (ZOFRAN-ODT) 4 MG disintegrating tablet  10/02/20   [provider]  Pediatric Multiple Vitamins (FLINTSTONES MULTIVITAMIN PO) Take 1 tablet by mouth daily.     [provider]  Probiotic Product (CULTURELLE PROBIOTICS PO) Take 1 tablet by mouth daily.     [provider]  tamsulosin (FLOMAX) 0.4 MG CAPS capsule Take 1 capsule (0.4 mg total) by mouth in the morning and at bedtime. 11/13/20   McKenzie, Candee Furbish, MD  Vibegron (GEMTESA) 75 MG TABS Take 1 capsule by mouth daily. 11/13/20   McKenzie, Candee Furbish, MD    Allergies    Patient has no known allergies.  Review of Systems   Review of Systems  Constitutional: Negative for chills, diaphoresis, fatigue and fever.  HENT: Positive for congestion. Negative for rhinorrhea.   Respiratory: Positive for cough and shortness of breath. Negative for chest tightness, wheezing and stridor.   Cardiovascular: Negative for chest pain, palpitations and leg swelling.  Gastrointestinal: Negative for abdominal pain, constipation, diarrhea, nausea and vomiting.  Genitourinary: Positive for decreased urine  volume. Negative for dysuria and flank pain.  Musculoskeletal: Negative for back pain, neck pain and neck stiffness.  Neurological: Negative for dizziness, loss of consciousness, speech difficulty, weakness, light-headedness, numbness and headaches.  Psychiatric/Behavioral: Negative for agitation and confusion.  All other systems reviewed and are negative.   Physical Exam Updated Vital Signs BP (!) 91/48 (BP Location: Left Arm)   Pulse 77   Temp 97.8 F (36.6 C) (Oral)   Resp 20   SpO2 92%   Physical Exam Vitals and nursing note reviewed.  Constitutional:      General: He is not in acute distress.    Appearance: He is well-developed. He is not ill-appearing, toxic-appearing or diaphoretic.  HENT:     Head: Normocephalic and atraumatic.     Nose: Nose normal. No rhinorrhea.     Mouth/Throat:     Mouth: Mucous membranes are dry.     Pharynx: No oropharyngeal exudate or posterior oropharyngeal erythema.  Eyes:     Extraocular Movements: Extraocular movements intact.     Conjunctiva/sclera:  Conjunctivae normal.     Pupils: Pupils are equal, round, and reactive to light.  Cardiovascular:     Rate and Rhythm: Regular rhythm. Tachycardia present.     Pulses: Normal pulses.     Heart sounds: No murmur heard.   Pulmonary:     Effort: Pulmonary effort is normal. No respiratory distress.     Breath sounds: Rhonchi present. No wheezing or rales.  Chest:     Chest wall: No tenderness.  Abdominal:     General: Abdomen is flat.     Palpations: Abdomen is soft.     Tenderness: There is no abdominal tenderness. There is no right CVA tenderness, left CVA tenderness, guarding or rebound.  Musculoskeletal:        General: No tenderness.     Cervical back: Neck supple. No tenderness.     Right lower leg: No edema.     Left lower leg: No edema.  Skin:    General: Skin is warm and dry.     Findings: No erythema.  Neurological:     General: No focal deficit present.     Mental Status: He is alert.     Sensory: No sensory deficit.     Motor: No weakness.  Psychiatric:        Mood and Affect: Mood normal.     ED Results / Procedures / Treatments   Labs (all labs ordered are listed, but only abnormal results are displayed) Labs Reviewed  BASIC METABOLIC PANEL - Abnormal; Notable for the following components:      Result Value   Potassium 5.2 (*)    CO2 18 (*)    Glucose, Bld 111 (*)    BUN 48 (*)    Creatinine, Ser 2.22 (*)    GFR, Estimated 27 (*)    All other components within normal limits  CBC - Abnormal; Notable for the following components:   RBC 3.68 (*)    Hemoglobin 11.6 (*)    HCT 36.3 (*)    Platelets 144 (*)    All other components within normal limits  URINALYSIS, ROUTINE W REFLEX MICROSCOPIC - Abnormal; Notable for the following components:   Bilirubin Urine SMALL (*)    All other components within normal limits  HEPATIC FUNCTION PANEL - Abnormal; Notable for the following components:   Bilirubin, Direct 0.3 (*)    All other components within normal  limits  URINE CULTURE  LACTIC ACID, PLASMA  LACTIC ACID, PLASMA  TSH    EKG EKG Interpretation  Date/Time:  Friday November 14 2020 12:55:26 EST Ventricular Rate:  86 PR Interval:    QRS Duration: 80 QT Interval:  364 QTC Calculation: 435 R Axis:   0 Text Interpretation: Atrial fibrillation Abnormal ECG When compared to prior, similar afib. No STEMI Confirmed by Antony Blackbird [phone]) on 11/14/2020 4:03:37 PM   Radiology DG Chest Portable 1 View  Result Date: 11/14/2020 CLINICAL DATA:  COVID-19 positive.  Weakness. EXAM: PORTABLE CHEST 1 VIEW COMPARISON:  10/29/2020 FINDINGS: The heart size and mediastinal contours are within normal limits. Both lungs are clear. The visualized skeletal structures are unremarkable. IMPRESSION: No active disease. Electronically Signed   By: Franchot Gallo M.D.   On: 11/14/2020 13:44    Procedures Procedures (including critical care time)  Medications Ordered in ED Medications  sodium chloride 0.9 % bolus 1,000 mL (has no administration in time range)  bamlanivimab 700 mg, etesevimab 1,400 mg in sodium chloride 0.9 % 160 mL IVPB (has no administration in time range)  0.9 %  sodium chloride infusion (has no administration in time range)  diphenhydrAMINE (BENADRYL) injection 50 mg (has no administration in time range)  famotidine (PEPCID) IVPB 20 mg premix (has no administration in time range)  methylPREDNISolone sodium succinate (SOLU-MEDROL) 125 mg/2 mL injection 125 mg (has no administration in time range)  albuterol (VENTOLIN HFA) 108 (90 Base) MCG/ACT inhaler 2 puff (has no administration in time range)  EPINEPHrine (EPI-PEN) injection 0.3 mg (has no administration in time range)    ED Course  I have reviewed the triage vital signs and the nursing notes.  Pertinent labs & imaging results that were available during my care of the patient were reviewed by me and considered in my medical decision making (see chart for details).    MDM  Rules/Calculators/A&P                          Tony Holt is a 86 y.o. male with a past medical history significant for hypertension, colon cancer status post colon surgery earlier this year, chronic A. fib not on anticoagulation, GERD, BPH, prior pelvis fracture, and recent diagnosis of COVID-19 infection 2 days ago who presents with worsening fatigue, shortness of breath, continued cough, and reported decreased urination.  Patient says that he was diagnosed with COVID-19 several days ago and since then has had worsening fatigue.  He is also had worsened shortness of breath and cough.  He says he has not had nausea or vomiting has not constipation or diarrhea but does say his urine has been decreasing in amount.  He has had shortness of breath but has not taken his oxygen saturations at home.  He denies history of home oxygen use at this time.  He denies significant chest pain or chest tightness.  Denies any syncope or trauma.  Denies any new extremity symptoms.  On exam, lungs have some coarseness bilaterally but chest is nontender.  Abdomen is nontender.  Good pulses in extremities.  Legs nontender nonedematous.  Oxygen saturations were initially in the 80s on his ear but then when moved to his forehead were in the 90s.  Mucous membranes are dry.  Rest of exam was otherwise unremarkable.  Patient had work-up which did show an AKI compared to most recent kidney function.  Chart review does show that he has had more elevated creatinine then today in  the past however, I do suspect he has AKI with dehydration in the setting of his new Covid infection.  He reports his symptoms been ongoing for the last 2 or 3 days.  His other labs showed normal lactic acid, hepatic function not elevated, no evidence of UTI, and he had a similar hemoglobin to prior with no leukocytosis.  Chest x-ray did not show pneumonia, pneumothorax, or other acute abnormality.  I discussed the patient's AKI with him and he would  like to go home since his vital signs are still reassuring.  He agrees to get fluids and due to his age and comorbidities agrees for [facility] antibiotic treatment before discharge home.  The patient still feeling well with maintaining oxygen saturations in the 90s on room air after Monocal antibody therapy, he will be discharged home.  Anticipate PCP follow-up as he is high risk for worsening given his age, AKI, and new Covid diagnosis however she still does not want admission to the hospital.  He will be discharged home after Mab.   Final Clinical Impression(s) / ED Diagnoses Final diagnoses:  AKI (acute kidney injury) ([place])  Dehydration     Clinical Impression: 1. AKI (acute kidney injury) ([place])   2. Dehydration     Disposition: Anticipate discharge after Monocal antibody treatment  Condition: Good  I have discussed the results, Dx and Tx plan with the pt(& family if present). He/she/they expressed understanding and agree(s) with the plan. Discharge instructions discussed at great length. Strict return precautions discussed and pt &/or family have verbalized understanding of the instructions. No further questions at time of discharge.    New Prescriptions   No medications on file    Follow Up: [name], MD [place] [address] [phone]     [place] [address] [id number] mc [place]       Ted Leonhart, Gwenyth Allegra, MD 11/14/20 2351

## 2020-11-15 DIAGNOSIS — R5383 Other fatigue: Secondary | ICD-10-CM | POA: Diagnosis present

## 2020-11-15 DIAGNOSIS — Z79899 Other long term (current) drug therapy: Secondary | ICD-10-CM | POA: Diagnosis not present

## 2020-11-15 DIAGNOSIS — I129 Hypertensive chronic kidney disease with stage 1 through stage 4 chronic kidney disease, or unspecified chronic kidney disease: Secondary | ICD-10-CM | POA: Diagnosis not present

## 2020-11-15 DIAGNOSIS — N170 Acute kidney failure with tubular necrosis: Secondary | ICD-10-CM | POA: Diagnosis not present

## 2020-11-15 DIAGNOSIS — R000 Tachycardia, unspecified: Secondary | ICD-10-CM | POA: Diagnosis not present

## 2020-11-15 DIAGNOSIS — N1831 Chronic kidney disease, stage 3a: Secondary | ICD-10-CM | POA: Diagnosis not present

## 2020-11-15 DIAGNOSIS — Z87891 Personal history of nicotine dependence: Secondary | ICD-10-CM | POA: Diagnosis not present

## 2020-11-15 DIAGNOSIS — Z7982 Long term (current) use of aspirin: Secondary | ICD-10-CM | POA: Diagnosis not present

## 2020-11-15 DIAGNOSIS — Z8546 Personal history of malignant neoplasm of prostate: Secondary | ICD-10-CM | POA: Diagnosis not present

## 2020-11-15 DIAGNOSIS — U071 COVID-19: Secondary | ICD-10-CM | POA: Diagnosis not present

## 2020-11-15 DIAGNOSIS — E860 Dehydration: Secondary | ICD-10-CM | POA: Diagnosis not present

## 2020-11-15 DIAGNOSIS — Z85038 Personal history of other malignant neoplasm of large intestine: Secondary | ICD-10-CM | POA: Diagnosis not present

## 2020-11-15 DIAGNOSIS — Z23 Encounter for immunization: Secondary | ICD-10-CM | POA: Diagnosis not present

## 2020-11-15 NOTE — ED Notes (Signed)
This RN informed the pts family regarding the infusion, its purpose, and patients disposition.

## 2020-11-16 LAB — URINE CULTURE

## 2020-11-19 ENCOUNTER — Encounter (HOSPITAL_COMMUNITY): Payer: Self-pay | Admitting: Internal Medicine

## 2020-11-19 ENCOUNTER — Inpatient Hospital Stay (HOSPITAL_COMMUNITY)
Admission: EM | Admit: 2020-11-19 | Discharge: 2020-11-23 | DRG: 177 | Disposition: A | Discharge status: Home Health | Payer: Medicare HMO | Attending: Internal Medicine | Admitting: Internal Medicine

## 2020-11-19 ENCOUNTER — Emergency Department (HOSPITAL_COMMUNITY): Payer: Medicare HMO

## 2020-11-19 ENCOUNTER — Other Ambulatory Visit: Payer: Self-pay

## 2020-11-19 DIAGNOSIS — J189 Pneumonia, unspecified organism: Secondary | ICD-10-CM

## 2020-11-19 DIAGNOSIS — U071 COVID-19: Principal | ICD-10-CM

## 2020-11-19 DIAGNOSIS — Z85038 Personal history of other malignant neoplasm of large intestine: Secondary | ICD-10-CM | POA: Diagnosis not present

## 2020-11-19 DIAGNOSIS — Z8249 Family history of ischemic heart disease and other diseases of the circulatory system: Secondary | ICD-10-CM

## 2020-11-19 DIAGNOSIS — I10 Essential (primary) hypertension: Secondary | ICD-10-CM

## 2020-11-19 DIAGNOSIS — N400 Enlarged prostate without lower urinary tract symptoms: Secondary | ICD-10-CM

## 2020-11-19 DIAGNOSIS — Z79899 Other long term (current) drug therapy: Secondary | ICD-10-CM

## 2020-11-19 DIAGNOSIS — Z66 Do not resuscitate: Secondary | ICD-10-CM | POA: Diagnosis not present

## 2020-11-19 DIAGNOSIS — H9190 Unspecified hearing loss, unspecified ear: Secondary | ICD-10-CM | POA: Diagnosis present

## 2020-11-19 DIAGNOSIS — R0602 Shortness of breath: Secondary | ICD-10-CM

## 2020-11-19 DIAGNOSIS — Z8546 Personal history of malignant neoplasm of prostate: Secondary | ICD-10-CM

## 2020-11-19 DIAGNOSIS — R7989 Other specified abnormal findings of blood chemistry: Secondary | ICD-10-CM | POA: Diagnosis not present

## 2020-11-19 DIAGNOSIS — J9601 Acute respiratory failure with hypoxia: Secondary | ICD-10-CM | POA: Diagnosis present

## 2020-11-19 DIAGNOSIS — R54 Age-related physical debility: Secondary | ICD-10-CM | POA: Diagnosis present

## 2020-11-19 DIAGNOSIS — N1831 Chronic kidney disease, stage 3a: Secondary | ICD-10-CM | POA: Diagnosis present

## 2020-11-19 DIAGNOSIS — E46 Unspecified protein-calorie malnutrition: Secondary | ICD-10-CM | POA: Diagnosis present

## 2020-11-19 DIAGNOSIS — D500 Iron deficiency anemia secondary to blood loss (chronic): Secondary | ICD-10-CM | POA: Diagnosis present

## 2020-11-19 DIAGNOSIS — B9562 Methicillin resistant Staphylococcus aureus infection as the cause of diseases classified elsewhere: Secondary | ICD-10-CM | POA: Diagnosis present

## 2020-11-19 DIAGNOSIS — J40 Bronchitis, not specified as acute or chronic: Secondary | ICD-10-CM | POA: Diagnosis present

## 2020-11-19 DIAGNOSIS — G9341 Metabolic encephalopathy: Secondary | ICD-10-CM | POA: Diagnosis present

## 2020-11-19 DIAGNOSIS — N401 Enlarged prostate with lower urinary tract symptoms: Secondary | ICD-10-CM | POA: Diagnosis present

## 2020-11-19 DIAGNOSIS — Z681 Body mass index (BMI) 19 or less, adult: Secondary | ICD-10-CM | POA: Diagnosis not present

## 2020-11-19 DIAGNOSIS — I129 Hypertensive chronic kidney disease with stage 1 through stage 4 chronic kidney disease, or unspecified chronic kidney disease: Secondary | ICD-10-CM | POA: Diagnosis present

## 2020-11-19 DIAGNOSIS — Z833 Family history of diabetes mellitus: Secondary | ICD-10-CM

## 2020-11-19 DIAGNOSIS — J1282 Pneumonia due to coronavirus disease 2019: Secondary | ICD-10-CM | POA: Diagnosis present

## 2020-11-19 DIAGNOSIS — M7989 Other specified soft tissue disorders: Secondary | ICD-10-CM | POA: Diagnosis not present

## 2020-11-19 DIAGNOSIS — K219 Gastro-esophageal reflux disease without esophagitis: Secondary | ICD-10-CM | POA: Diagnosis present

## 2020-11-19 DIAGNOSIS — N138 Other obstructive and reflux uropathy: Secondary | ICD-10-CM | POA: Diagnosis present

## 2020-11-19 DIAGNOSIS — Z7982 Long term (current) use of aspirin: Secondary | ICD-10-CM

## 2020-11-19 DIAGNOSIS — J90 Pleural effusion, not elsewhere classified: Secondary | ICD-10-CM | POA: Diagnosis not present

## 2020-11-19 DIAGNOSIS — Z87891 Personal history of nicotine dependence: Secondary | ICD-10-CM | POA: Diagnosis not present

## 2020-11-19 DIAGNOSIS — N179 Acute kidney failure, unspecified: Secondary | ICD-10-CM | POA: Diagnosis present

## 2020-11-19 DIAGNOSIS — N281 Cyst of kidney, acquired: Secondary | ICD-10-CM | POA: Diagnosis not present

## 2020-11-19 DIAGNOSIS — I482 Chronic atrial fibrillation, unspecified: Secondary | ICD-10-CM | POA: Diagnosis present

## 2020-11-19 LAB — CBC
HCT: 35.9 % — ABNORMAL LOW (ref 39.0–52.0)
Hemoglobin: 11.3 g/dL — ABNORMAL LOW (ref 13.0–17.0)
MCH: 30.7 pg (ref 26.0–34.0)
MCHC: 31.5 g/dL (ref 30.0–36.0)
MCV: 97.6 fL (ref 80.0–100.0)
Platelets: 191 10*3/uL (ref 150–400)
RBC: 3.68 MIL/uL — ABNORMAL LOW (ref 4.22–5.81)
RDW: 13.6 % (ref 11.5–15.5)
WBC: 10.6 10*3/uL — ABNORMAL HIGH (ref 4.0–10.5)
nRBC: 0 % (ref 0.0–0.2)

## 2020-11-19 LAB — COMPREHENSIVE METABOLIC PANEL
ALT: 20 U/L (ref 0–44)
AST: 19 U/L (ref 15–41)
Albumin: 3 g/dL — ABNORMAL LOW (ref 3.5–5.0)
Alkaline Phosphatase: 118 U/L (ref 38–126)
Anion gap: 14 (ref 5–15)
BUN: 55 mg/dL — ABNORMAL HIGH (ref 8–23)
CO2: 16 mmol/L — ABNORMAL LOW (ref 22–32)
Calcium: 9.3 mg/dL (ref 8.9–10.3)
Chloride: 104 mmol/L (ref 98–111)
Creatinine, Ser: 2.62 mg/dL — ABNORMAL HIGH (ref 0.61–1.24)
GFR, Estimated: 22 mL/min — ABNORMAL LOW (ref >60)
Glucose, Bld: 124 mg/dL — ABNORMAL HIGH (ref 70–99)
Potassium: 4.4 mmol/L (ref 3.5–5.1)
Sodium: 134 mmol/L — ABNORMAL LOW (ref 135–145)
Total Bilirubin: 0.7 mg/dL (ref 0.3–1.2)
Total Protein: 6.5 g/dL (ref 6.5–8.1)

## 2020-11-19 LAB — HIV ANTIBODY (ROUTINE TESTING W REFLEX): HIV Screen 4th Generation wRfx: NONREACTIVE

## 2020-11-19 LAB — RESP PANEL BY RT-PCR (FLU A&B, COVID) ARPGX2
Influenza A by PCR: NEGATIVE
Influenza B by PCR: NEGATIVE
SARS Coronavirus 2 by RT PCR: POSITIVE — AB

## 2020-11-19 LAB — C-REACTIVE PROTEIN: CRP: 35.4 mg/dL — ABNORMAL HIGH (ref <1.0)

## 2020-11-19 LAB — TRIGLYCERIDES: Triglycerides: 89 mg/dL (ref <150)

## 2020-11-19 LAB — PROCALCITONIN: Procalcitonin: 1.27 ng/mL

## 2020-11-19 LAB — LACTIC ACID, PLASMA
Lactic Acid, Venous: 1.8 mmol/L (ref 0.5–1.9)
Lactic Acid, Venous: 1.6 mmol/L (ref 0.5–1.9)

## 2020-11-19 LAB — FERRITIN: Ferritin: 954 ng/mL — ABNORMAL HIGH (ref 24–336)

## 2020-11-19 LAB — LACTATE DEHYDROGENASE: LDH: 141 U/L (ref 98–192)

## 2020-11-19 MED ORDER — ONDANSETRON HCL 4 MG/2ML IJ SOLN
4.0000 mg | Freq: Four times a day (QID) | INTRAMUSCULAR | Status: DC | PRN
  Administered 2020-11-22: 10:00:00 4 mg via INTRAVENOUS
  Filled 2020-11-19: qty 2

## 2020-11-19 MED ORDER — ASPIRIN EC 81 MG PO TBEC
81.0000 mg | DELAYED_RELEASE_TABLET | Freq: Every day | ORAL | Status: DC
  Administered 2020-11-20 – 2020-11-23 (×4): 81 mg via ORAL
  Filled 2020-11-19 (×5): qty 1

## 2020-11-19 MED ORDER — POLYETHYLENE GLYCOL 3350 17 G PO PACK: 17.0000 g | PACK | Freq: Every day | ORAL | Status: DC | PRN

## 2020-11-19 MED ORDER — ZINC SULFATE 220 (50 ZN) MG PO CAPS
220.0000 mg | ORAL_CAPSULE | Freq: Every day | ORAL | Status: DC
  Administered 2020-11-19 – 2020-11-23 (×5): 220 mg via ORAL
  Filled 2020-11-19 (×5): qty 1

## 2020-11-19 MED ORDER — SODIUM CHLORIDE 0.9 % IV SOLN
200.0000 mg | Freq: Once | INTRAVENOUS | Status: AC
  Administered 2020-11-20: 200 mg via INTRAVENOUS
  Filled 2020-11-19: qty 40

## 2020-11-19 MED ORDER — DEXAMETHASONE SODIUM PHOSPHATE 10 MG/ML IJ SOLN
10.0000 mg | Freq: Once | INTRAMUSCULAR | Status: AC
  Administered 2020-11-19: 10 mg via INTRAVENOUS
  Filled 2020-11-19: qty 1

## 2020-11-19 MED ORDER — DOCUSATE SODIUM 100 MG PO CAPS: 100.0000 mg | ORAL_CAPSULE | Freq: Two times a day (BID) | ORAL | Status: DC | PRN

## 2020-11-19 MED ORDER — VIBEGRON 75 MG PO TABS: 75.0000 mg | ORAL_TABLET | Freq: Every day | ORAL | Status: DC

## 2020-11-19 MED ORDER — SODIUM CHLORIDE 0.9 % IV BOLUS
1000.0000 mL | Freq: Once | INTRAVENOUS | Status: AC
  Administered 2020-11-19: 1000 mL via INTRAVENOUS

## 2020-11-19 MED ORDER — ALBUTEROL SULFATE HFA 108 (90 BASE) MCG/ACT IN AERS
2.0000 | INHALATION_SPRAY | RESPIRATORY_TRACT | Status: DC | PRN
  Administered 2020-11-22: 22:00:00 2 via RESPIRATORY_TRACT
  Filled 2020-11-19: qty 6.7

## 2020-11-19 MED ORDER — SODIUM CHLORIDE 0.9 % IV SOLN
2.0000 g | INTRAVENOUS | Status: DC
  Administered 2020-11-19 – 2020-11-21 (×3): 2 g via INTRAVENOUS
  Filled 2020-11-19 (×3): qty 20

## 2020-11-19 MED ORDER — TAMSULOSIN HCL 0.4 MG PO CAPS
0.4000 mg | ORAL_CAPSULE | Freq: Two times a day (BID) | ORAL | Status: DC
  Administered 2020-11-19 – 2020-11-23 (×8): 0.4 mg via ORAL
  Filled 2020-11-19 (×8): qty 1

## 2020-11-19 MED ORDER — SODIUM CHLORIDE 0.9 % IV SOLN: INTRAVENOUS | Status: AC

## 2020-11-19 MED ORDER — SODIUM CHLORIDE 0.9 % IV SOLN
500.0000 mg | INTRAVENOUS | Status: DC
  Administered 2020-11-19 – 2020-11-21 (×3): 500 mg via INTRAVENOUS
  Filled 2020-11-19 (×4): qty 500

## 2020-11-19 MED ORDER — HYDROCODONE-ACETAMINOPHEN 7.5-325 MG PO TABS
1.0000 | ORAL_TABLET | Freq: Three times a day (TID) | ORAL | Status: DC | PRN
  Administered 2020-11-20 – 2020-11-22 (×3): 1 via ORAL
  Filled 2020-11-19 (×3): qty 1

## 2020-11-19 MED ORDER — ACETAMINOPHEN 325 MG PO TABS: 650.0000 mg | ORAL_TABLET | Freq: Four times a day (QID) | ORAL | Status: DC | PRN

## 2020-11-19 MED ORDER — ENSURE ENLIVE PO LIQD
237.0000 mL | Freq: Two times a day (BID) | ORAL | Status: DC
  Administered 2020-11-20 – 2020-11-23 (×6): 237 mL via ORAL

## 2020-11-19 MED ORDER — DEXAMETHASONE SODIUM PHOSPHATE 10 MG/ML IJ SOLN
6.0000 mg | INTRAMUSCULAR | Status: DC
  Administered 2020-11-20 – 2020-11-22 (×3): 6 mg via INTRAVENOUS
  Filled 2020-11-19 (×3): qty 1

## 2020-11-19 MED ORDER — ONDANSETRON HCL 4 MG PO TABS: 4.0000 mg | ORAL_TABLET | Freq: Four times a day (QID) | ORAL | Status: DC | PRN

## 2020-11-19 MED ORDER — PANTOPRAZOLE SODIUM 40 MG PO TBEC
40.0000 mg | DELAYED_RELEASE_TABLET | Freq: Two times a day (BID) | ORAL | Status: DC
  Administered 2020-11-20 – 2020-11-23 (×7): 40 mg via ORAL
  Filled 2020-11-19 (×7): qty 1

## 2020-11-19 MED ORDER — ALBUTEROL SULFATE HFA 108 (90 BASE) MCG/ACT IN AERS
2.0000 | INHALATION_SPRAY | Freq: Four times a day (QID) | RESPIRATORY_TRACT | Status: DC
  Filled 2020-11-19: qty 6.7

## 2020-11-19 MED ORDER — ENOXAPARIN SODIUM 30 MG/0.3ML ~~LOC~~ SOLN
30.0000 mg | SUBCUTANEOUS | Status: DC
  Administered 2020-11-19: 30 mg via SUBCUTANEOUS
  Filled 2020-11-19: qty 0.3

## 2020-11-19 MED ORDER — GUAIFENESIN-DM 100-10 MG/5ML PO SYRP
10.0000 mL | ORAL_SOLUTION | ORAL | Status: DC | PRN
  Administered 2020-11-20 – 2020-11-22 (×2): 10 mL via ORAL
  Filled 2020-11-19 (×2): qty 10

## 2020-11-19 MED ORDER — ASCORBIC ACID 500 MG PO TABS
500.0000 mg | ORAL_TABLET | Freq: Every day | ORAL | Status: DC
  Administered 2020-11-19 – 2020-11-23 (×5): 500 mg via ORAL
  Filled 2020-11-19 (×6): qty 1

## 2020-11-19 MED ORDER — SODIUM CHLORIDE 0.9 % IV SOLN
100.0000 mg | Freq: Every day | INTRAVENOUS | Status: AC
  Administered 2020-11-20 – 2020-11-23 (×4): 100 mg via INTRAVENOUS
  Filled 2020-11-19 (×5): qty 20

## 2020-11-19 NOTE — ED Notes (Signed)
Unable to draw second set of blood culture. Notified Dr. Cyd Silence. Phlebotomist unavailable.

## 2020-11-19 NOTE — ED Provider Notes (Signed)
[facility] EMERGENCY DEPARTMENT Provider Note   CSN: [id number] Arrival date & time: 11/19/20  1053     History No chief complaint on file.   Tony Holt is a 86 y.o. male.  HPI Elderly male with extremely bad hearing presents with concern of weakness, cough, fatigue. History is obtained by the patient, minimally, as he has a very hard time hearing anything, and on chart review.  Line patient seemingly lives at home with a family member, has been progressively weaker. Illness began about 10 days ago, and he was diagnosed with Covid 7 days ago. He received monoclonal antibody fusion 4 days ago. Spite of these therapies the patient has been progressively incapacitated. It is unclear if he has any specific pain, but he does complain of feeling poorly. Chart notes that the patient was diagnosed positive, received antibiotic therapy during that ED visit, and was discharged, appropriate, but in guarded condition with concern for his frailty, advanced age.     Past Medical History:  Diagnosis Date  . BPH (benign prostatic hyperplasia)   . Colon cancer ([place]) 1999  . Dysrhythmia    new onset A Fib  . Essential hypertension   . GERD (gastroesophageal reflux disease)   . Hard of hearing   . Prostate cancer [facility])     Patient Active Problem List   Diagnosis Date Noted  . Benign prostatic hyperplasia with urinary obstruction 10/15/2020  . Nocturia 10/15/2020  . Pelvis fracture, right, closed, initial encounter ([place]) 10/01/2020  . Lobar pneumonia ([place]) 09/03/2020  . Acute respiratory failure with hypoxia ([place]) 09/03/2020  . Acute renal failure superimposed on stage 3a chronic kidney disease ([place]) 09/03/2020  . Respiratory failure with hypoxia ([place]) 09/02/2020  . Community acquired pneumonia   . Elevated PSA 04/28/2020  . AKI (acute kidney injury) ([place]) 02/23/2020  . Prostate cancer ([place]) 02/22/2020  . Atrial fibrillation, chronic ([place]) 02/22/2020  . Cecal  cancer ([place])   . IDA (iron deficiency anemia) 05/17/2019  . Constipation 05/17/2019  . Bradycardia 05/28/2016  . Weakness 05/28/2016  . Acute renal failure ([place]) 05/28/2016  . Hyperkalemia 05/28/2016  . Nausea alone 03/24/2012  . Abdominal pain, epigastric 03/24/2012  . HTN (hypertension) 03/24/2012  . ANEMIA 09/16/2010  . HEMOCCULT POSITIVE STOOL 09/16/2010    Past Surgical History:  Procedure Laterality Date  . BIOPSY  12/28/2019   Procedure: BIOPSY;  Surgeon: Daneil Dolin, MD;  Location: [facility] END[facility];  Service: Endoscopy;;  gastric, esophagus,cecal  . CATARACT EXTRACTION W/PHACO  01/18/2013   Procedure: CATARACT EXTRACTION PHACO AND INTRAOCULAR LENS PLACEMENT ([place]);  Surgeon: Tonny Branch, MD;  Location: [facility] ORS;  Service: Ophthalmology;  Laterality: Left;  CDE 17.11  . CATARACT EXTRACTION W/PHACO Right 02/05/2013   Procedure: CATARACT EXTRACTION PHACO AND INTRAOCULAR LENS PLACEMENT (IOC);  Surgeon: Tonny Branch, MD;  Location: [facility] ORS;  Service: Ophthalmology;  Laterality: Right;  CDE:19.49  . COLON SURGERY  1999   Sigmoid colon cancer, segmental resection by Drs. Smith/Bradford  . COLONOSCOPY  2002   Multiple adenomatous colon polyps  . COLONOSCOPY N/A 12/28/2019   Procedure: COLONOSCOPY;  Surgeon: Daneil Dolin, MD;  Location: [facility];  Service: Endoscopy;  Laterality: N/A;  . ESOPHAGOGASTRODUODENOSCOPY  03/25/2012   Dr. Gala Romney: Severe ulcerative/erosive reflux esophagitis.  Small hiatal hernia.  Abnormal gastric and duodenal mucosa, biopsy showed reactive changes and minimal chronic inflammation but no H. pylori.  . ESOPHAGOGASTRODUODENOSCOPY N/A 12/28/2019   Procedure: ESOPHAGOGASTRODUODENOSCOPY (EGD);  Surgeon: Gala Romney,  Cristopher Estimable, MD;  Location: [facility] END[facility];  Service: Endoscopy;  Laterality: N/A;  . HERNIA REPAIR  02/2011   Right inguinal hernia repair with mesh, Dr. Geroge Baseman  . HERNIA REPAIR     three total  . PARTIAL COLECTOMY Right 02/04/2020   Procedure: RIGHT  HEMI-COLECTOMY;  Surgeon: Virl Cagey, MD;  Location: [facility] ORS;  Service: General;  Laterality: Right;  . POLYPECTOMY  12/28/2019   Procedure: POLYPECTOMY;  Surgeon: Daneil Dolin, MD;  Location: [facility];  Service: Endoscopy;;  rectal        Family History  Problem Relation Age of Onset  . Heart attack Father        Deceased, age 43s  . Diabetes Brother   . Colon cancer Neg Hx   . Liver disease Neg Hx   . Breast cancer Neg Hx   . Prostate cancer Neg Hx   . Pancreatic cancer Neg Hx     Social History   Tobacco Use  . Smoking status: Former Smoker    Packs/day: 1.00    Years: 50.00    Pack years: 50.00    Types: Cigarettes    Quit date: 12/13/1989    Years since quitting: 30.9  . Smokeless tobacco: Current User    Types: Snuff  Vaping Use  . Vaping Use: Never used  Substance Use Topics  . Alcohol use: No    Alcohol/week: 0.0 standard drinks    Comment: previous heavy drinker at age 65-32  . Drug use: No    Home Medications Prior to Admission medications   Medication Sig Start Date End Date Taking? Authorizing Provider  Aspirin 81 MG CAPS Take 81 mg by mouth daily. 09/04/20   Orson Eva, MD  diphenhydramine-acetaminophen (TYLENOL PM) 25-500 MG TABS tablet Take 4 tablets by mouth at bedtime.     [provider]  docusate sodium (COLACE) 100 MG capsule Take 1 capsule (100 mg total) by mouth 2 (two) times daily as needed for mild constipation. 02/07/20   Virl Cagey, MD  Ensure (ENSURE) Take 237 mLs by mouth in the morning, at noon, and at bedtime.     [provider]  furosemide (LASIX) 20 MG tablet Take 20 mg by mouth daily as needed for fluid.  10/01/20   [provider]  HYDROcodone-acetaminophen (NORCO) 7.5-325 MG tablet Take 1 tablet by mouth in the morning, at noon, in the evening, and at bedtime.  10/01/20   [provider]  mirabegron ER (MYRBETRIQ) 50 MG TB24 tablet Take 1 tablet (50 mg total) by mouth daily.  11/13/20   McKenzie, Candee Furbish, MD  Multiple Vitamins-Minerals (MULTIVITAMIN WITH MINERALS) tablet Take 1 tablet by mouth daily.    [provider]  omeprazole (PRILOSEC) 20 MG capsule Take 20 mg by mouth 2 (two) times daily. 05/09/16   [provider]  ondansetron (ZOFRAN-ODT) 4 MG disintegrating tablet Take 4 mg by mouth daily as needed for nausea or vomiting.  10/02/20   [provider]  Probiotic Product (CULTURELLE PROBIOTICS PO) Take 1 tablet by mouth daily.     [provider]  tamsulosin (FLOMAX) 0.4 MG CAPS capsule Take 1 capsule (0.4 mg total) by mouth in the morning and at bedtime. 11/13/20   McKenzie, Candee Furbish, MD  Vibegron (GEMTESA) 75 MG TABS Take 1 capsule by mouth daily. Patient taking differently: Take 75 mg by mouth daily.  11/13/20   McKenzie, Candee Furbish, MD    Allergies  Patient has no known allergies.  Review of Systems   Review of Systems  Unable to perform ROS: Acuity of condition  And hearing insufficiency  Physical Exam Updated Vital Signs BP 131/82 (BP Location: Right Arm)   Pulse (!) 112   Temp 98.7 F (37.1 C)   Resp (!) 26   SpO2 99%   Physical Exam Vitals and nursing note reviewed.  Constitutional:      Appearance: He is well-developed. He is ill-appearing.     Comments: Extremely deconditioned elderly male  HENT:     Head: Normocephalic and atraumatic.     Nose: Nose normal. No rhinorrhea.     Mouth/Throat:     Mouth: Mucous membranes are dry.     Pharynx: No oropharyngeal exudate or posterior oropharyngeal erythema.  Eyes:     Extraocular Movements: Extraocular movements intact.     Conjunctiva/sclera: Conjunctivae normal.     Pupils: Pupils are equal, round, and reactive to light.  Cardiovascular:     Rate and Rhythm: Regular rhythm. Tachycardia present.     Pulses: Normal pulses.     Heart sounds: No murmur heard.   Pulmonary:     Effort: Tachypnea and accessory muscle usage present.     Breath  sounds: Rhonchi present.  Chest:     Chest wall: No tenderness.  Abdominal:     General: Abdomen is flat.     Palpations: Abdomen is soft.     Tenderness: There is no abdominal tenderness. There is no guarding.  Musculoskeletal:        General: No tenderness.     Cervical back: Neck supple. No tenderness.     Right lower leg: No edema.     Left lower leg: No edema.  Skin:    General: Skin is warm and dry.     Findings: No erythema.  Neurological:     Mental Status: He is alert.     Motor: Atrophy present.     Comments: Extremely poor hearing, otherwise cranial nerves grossly unremarkable  Psychiatric:        Mood and Affect: Mood normal.        Behavior: Behavior is slowed and withdrawn.      ED Results / Procedures / Treatments   Labs (all labs ordered are listed, but only abnormal results are displayed) Labs Reviewed  CBC - Abnormal; Notable for the following components:      Result Value   WBC 10.6 (*)    RBC 3.68 (*)    Hemoglobin 11.3 (*)    HCT 35.9 (*)    All other components within normal limits  COMPREHENSIVE METABOLIC PANEL - Abnormal; Notable for the following components:   Sodium 134 (*)    CO2 16 (*)    Glucose, Bld 124 (*)    BUN 55 (*)    Creatinine, Ser 2.62 (*)    Albumin 3.0 (*)    GFR, Estimated 22 (*)    All other components within normal limits  RESP PANEL BY RT-PCR (FLU A&B, COVID) ARPGX2  CULTURE, BLOOD (ROUTINE X 2)  CULTURE, BLOOD (ROUTINE X 2)  LACTIC ACID, PLASMA  LACTIC ACID, PLASMA  D-DIMER, QUANTITATIVE (NOT AT [facility])  PROCALCITONIN  LACTATE DEHYDROGENASE  FERRITIN  TRIGLYCERIDES  FIBRINOGEN  C-REACTIVE PROTEIN   Radiology DG Chest 1 View  Result Date: 11/19/2020 CLINICAL DATA:  COVID-19 EXAM: CHEST  1 VIEW COMPARISON:  11/14/2020 and prior. FINDINGS: Patchy right predominant bilateral pulmonary opacities. No pneumothorax or  pleural effusion. Stable cardiomediastinal silhouette. No acute osseous abnormality. IMPRESSION:  Multifocal pneumonia. Electronically Signed   By: Primitivo Gauze M.D.   On: 11/19/2020 13:25    Procedures Procedures (including critical care time)  Medications Ordered in ED Medications  sodium chloride 0.9 % bolus 1,000 mL (has no administration in time range)    ED Course  I have reviewed the triage vital signs and the nursing notes.  Pertinent labs & imaging results that were available during my care of the patient were reviewed by me and considered in my medical decision making (see chart for details).  Patient placed on continuous monitoring after initial evaluation, fluids started.  Update:, Labs notable for worsening renal function with creatinine 2.6, BUN 55, double from 2 months ago, increased from 3 days ago in the ED. Given worsening renal function, tachypnea, tachycardia, patient meets SIRS criteria, has source of infection and Covid, will require admission for further monitoring, management. Decadron started in the ED, case discussed with internal medicine. MDM Rules/Calculators/A&P MDM Number of Diagnoses or Management Options AKI (acute kidney injury) ([place]): established, worsening Pneumonia due to COVID-19 virus: established, worsening   Amount and/or Complexity of Data Reviewed Clinical lab tests: reviewed Tests in the radiology section of CPT: reviewed Tests in the medicine section of CPT: reviewed Decide to obtain previous medical records or to obtain history from someone other than the patient: yes Obtain history from someone other than the patient: yes Review and summarize past medical records: yes Discuss the patient with other providers: yes Independent visualization of images, tracings, or specimens: yes  Risk of Complications, Morbidity, and/or Mortality Presenting problems: high Diagnostic procedures: high Management options: high  Critical Care Total time providing critical care: < 30 minutes  Patient Progress Patient progress:  stable  Final Clinical Impression(s) / ED Diagnoses Final diagnoses:  Pneumonia due to COVID-19 virus  AKI (acute kidney injury) ([place])     Carmin Muskrat, MD 11/19/20 [phone]

## 2020-11-19 NOTE — H&P (Addendum)
History and Physical    [name]:[id number] DOB: [birth year] DOA: 11/19/2020  PCP: [name], MD  Patient coming from: [place]   Chief Complaint: Weakness, Shortness of Breath   HPI:    86 year old male with past medical history of chronic kidney disease stage IIIa, gastroesophageal reflux disease, hypertension, benign prostatic hyperplasia, prostate cancer, chronic atrial fibrillation, iron deficiency anemia, cecal adenocarcinoma (Dx 01/2020) who presents to [facility] emergency department with complaints of generalized weakness shortness of breath and cough.  Note, patient is currently a poor historian secondary to encephalopathy due to COVID-19 infection.  Patient was diagnosed with COVID-19 on December 01. Several days later, on December 04 patient received a monoclonal antibody infusion while being seen in our emergency department at [facility].   In the days that followed, patient has continued to develop progressively worsening shortness of breath. Shortness of breath is moderate to severe in intensity, worse with exertion and improved with rest. Shortness of breath is associated with cough productive with yellow sputum. Patient is also complaining of generalized weakness and poor oral intake. Patient has not taken his temperature.  Patient symptoms have continued to progress until he eventually presented to [facility] emergency department with the above listed symptoms.  Evaluation in the emergency department patient is found to be hypoxic, initially with saturations in the 70s. Patient has since been placed on supplemental oxygen via nasal cannula. Patient is also been found to be suffering from acute kidney injury superimposed on chronic kidney disease. Chest x-ray reveals extensive bilateral infiltrates consistent with pneumonia. COVID-19 PCR testing is once again positive. Patient has been given a dose of intravenous  Decadron. Patient is also been given 1 L of normal saline bolus. Hospitalist group is now been called to assess the patient for admission to the hospital.   Review of Systems:   Review of Systems  Constitutional: Positive for malaise/fatigue.  Respiratory: Positive for cough, sputum production and shortness of breath.   Neurological: Positive for weakness.    Past Medical History:  Diagnosis Date  . BPH (benign prostatic hyperplasia)   . Colon cancer ([place]) 1999  . Dysrhythmia    new onset A Fib  . Essential hypertension   . GERD (gastroesophageal reflux disease)   . Hard of hearing   . Prostate cancer [facility])     Past Surgical History:  Procedure Laterality Date  . BIOPSY  12/28/2019   Procedure: BIOPSY;  Surgeon: Daneil Dolin, MD;  Location: [facility];  Service: [facility];;  gastric, esophagus,cecal  . CATARACT EXTRACTION W/PHACO  01/18/2013   Procedure: CATARACT EXTRACTION PHACO AND INTRAOCULAR LENS PLACEMENT ([place]);  Surgeon: Tonny Branch, MD;  Location: [facility];  Service: Ophthalmology;  Laterality: Left;  CDE 17.11  . CATARACT EXTRACTION W/PHACO Right 02/05/2013   Procedure: CATARACT EXTRACTION PHACO AND INTRAOCULAR LENS PLACEMENT ([facility]);  Surgeon: Tonny Branch, MD;  Location: [facility];  Service: Ophthalmology;  Laterality: Right;  CDE:19.49  . COLON SURGERY  1999   Sigmoid colon cancer, segmental resection by Drs. Smith/Bradford  . COLONOSCOPY  2002   Multiple adenomatous colon polyps  . COLONOSCOPY N/A 12/28/2019   Procedure: COLONOSCOPY;  Surgeon: Daneil Dolin, MD;  Location: [facility];  Service: [facility];  Laterality: N/A;  . ESOPHAGOGASTRODUODENOSCOPY  03/25/2012   Dr. Gala Romney: Severe ulcerative/erosive reflux esophagitis.  Small hiatal hernia.  Abnormal gastric and duodenal mucosa, biopsy showed reactive changes and minimal chronic inflammation but no H. pylori.  Marland Kitchen  ESOPHAGOGASTRODUODENOSCOPY N/A 12/28/2019   Procedure: ESOPHAGOGASTRODUODENOSCOPY (EGD);  Surgeon: Daneil Dolin, MD;  Location: [facility];  Service: [facility];  Laterality: N/A;  . HERNIA REPAIR  02/2011   Right inguinal hernia repair with mesh, Dr. Geroge Baseman  . HERNIA REPAIR     three total  . PARTIAL COLECTOMY Right 02/04/2020   Procedure: RIGHT HEMI-COLECTOMY;  Surgeon: Virl Cagey, MD;  Location: [facility];  Service: General;  Laterality: Right;  . POLYPECTOMY  12/28/2019   Procedure: POLYPECTOMY;  Surgeon: Daneil Dolin, MD;  Location: [facility];  Service: [facility];;  rectal      reports that he quit smoking about 30 years ago. His smoking use included cigarettes. He has a 50.00 pack-year smoking history. His smokeless tobacco use includes snuff. He reports that he does not drink alcohol and does not use drugs.  No Known Allergies  Family History  Problem Relation Age of Onset  . Heart attack Father        Deceased, age 26s  . Diabetes Brother   . Colon cancer Neg Hx   . Liver disease Neg Hx   . Breast cancer Neg Hx   . Prostate cancer Neg Hx   . Pancreatic cancer Neg Hx      Prior to Admission medications   Medication Sig Start Date End Date Taking? Authorizing Provider  Aspirin 81 MG CAPS Take 81 mg by mouth daily. 09/04/20   Orson Eva, MD  diphenhydramine-acetaminophen (TYLENOL PM) 25-500 MG TABS tablet Take 4 tablets by mouth at bedtime.     [provider]  docusate sodium (COLACE) 100 MG capsule Take 1 capsule (100 mg total) by mouth 2 (two) times daily as needed for mild constipation. 02/07/20   Virl Cagey, MD  Ensure (ENSURE) Take 237 mLs by mouth in the morning, at noon, and at bedtime.     [provider]  furosemide (LASIX) 20 MG tablet Take 20 mg by mouth daily as needed for fluid.  10/01/20   [provider]  HYDROcodone-acetaminophen (NORCO) 7.5-325 MG tablet Take 1 tablet by mouth in the morning, at noon, in the evening, and at bedtime.  10/01/20   [provider]  mirabegron ER (MYRBETRIQ) 50 MG TB24 tablet  Take 1 tablet (50 mg total) by mouth daily. 11/13/20   McKenzie, Candee Furbish, MD  Multiple Vitamins-Minerals (MULTIVITAMIN WITH MINERALS) tablet Take 1 tablet by mouth daily.    [provider]  omeprazole (PRILOSEC) 20 MG capsule Take 20 mg by mouth 2 (two) times daily. 05/09/16   [provider]  ondansetron (ZOFRAN-ODT) 4 MG disintegrating tablet Take 4 mg by mouth daily as needed for nausea or vomiting.  10/02/20   [provider]  Probiotic Product (CULTURELLE PROBIOTICS PO) Take 1 tablet by mouth daily.     [provider]  tamsulosin (FLOMAX) 0.4 MG CAPS capsule Take 1 capsule (0.4 mg total) by mouth in the morning and at bedtime. 11/13/20   McKenzie, Candee Furbish, MD  Vibegron (GEMTESA) 75 MG TABS Take 1 capsule by mouth daily. Patient taking differently: Take 75 mg by mouth daily.  11/13/20   Cleon Gustin, MD    Physical Exam: Vitals:   11/19/20 1550 11/19/20 1730 11/19/20 1830 11/19/20 1915  BP: 131/82 (!) 132/117 (!) 128/116 (!) 143/91  Pulse: (!) 112 (!) 113 (!) 116 (!) 121  Resp: (!) 26 (!) 21 (!) 23 (!) 23  Temp: 98.7 F (37.1 C)  TempSrc:      SpO2: 99% 100% 100% 99%    Constitutional: Lethargic but arousable, oriented x1, patient is currently exhibiting respiratory distress. Patient is cachectic. Skin: no rashes, no lesions, extremely poor skin turgor noted. Eyes: Pupils are equally reactive to light. Increased conjunctival pallor noted without evidence of scleral icterus. ENMT: Notable temporal wasting. Extremely dry mucous membranes noted.  Posterior pharynx clear of any exudate or lesions.   Neck: normal, supple, no masses, no thyromegaly.  No evidence of jugular venous distension.   Respiratory: Coarse breath sounds bilaterally. Rhonchi noted in all fields bilaterally with bibasilar mid field rales. Intermittent expiratory wheezing noted. Patient is visibly tachypneic without evidence of accessory muscle use.  Cardiovascular:  Tachycardic but regular, no murmurs / rubs / gallops. No extremity edema. 2+ pedal pulses. No carotid bruits.  Chest:   Nontender without crepitus or deformity.   Back:   Nontender without crepitus or deformity. Abdomen: Abdomen is soft and nontender.  No evidence of intra-abdominal masses.  Positive bowel sounds noted in all quadrants.   Musculoskeletal: Extremely poor muscle tone noted. No joint deformity upper and lower extremities. Good ROM, no contractures. Normal muscle tone.  Neurologic: Patient is lethargic but arousable and oriented x1. Patient does follow commands. CN 2-12 grossly intact. Sensation intact.  Patient moving all 4 extremities spontaneously.   Patient is responsive to verbal stimuli.   Psychiatric: Patient exhibits anxious mood with appropriate affect. Patient currently does not seem to possess insight as to his current situation.     Labs on Admission: I have personally reviewed following labs and imaging studies -   CBC: Recent Labs  Lab 11/14/20 1300 11/19/20 1230  WBC 7.5 10.6*  HGB 11.6* 11.3*  HCT 36.3* 35.9*  MCV 98.6 97.6  PLT 144* 505   Basic Metabolic Panel: Recent Labs  Lab 11/14/20 1300 11/19/20 1230  NA 135 134*  K 5.2* 4.4  CL 105 104  CO2 18* 16*  GLUCOSE 111* 124*  BUN 48* 55*  CREATININE 2.22* 2.62*  CALCIUM 9.2 9.3   GFR: Estimated Creatinine Clearance: 15 mL/min (A) (by C-G formula based on SCr of 2.62 mg/dL (H)). Liver Function Tests: Recent Labs  Lab 11/14/20 1737 11/19/20 1230  AST 28 19  ALT 23 20  ALKPHOS 79 118  BILITOT 0.7 0.7  PROT 6.9 6.5  ALBUMIN 3.8 3.0*   No results for input(s): LIPASE, AMYLASE in the last 168 hours. No results for input(s): AMMONIA in the last 168 hours. Coagulation Profile: No results for input(s): INR, PROTIME in the last 168 hours. Cardiac Enzymes: No results for input(s): CKTOTAL, CKMB, CKMBINDEX, TROPONINI in the last 168 hours. BNP (last 3 results) No results for input(s): PROBNP in  the last 8760 hours. HbA1C: No results for input(s): HGBA1C in the last 72 hours. CBG: No results for input(s): GLUCAP in the last 168 hours. Lipid Profile: Recent Labs    11/19/20 1637  TRIG 89   Thyroid Function Tests: No results for input(s): TSH, T4TOTAL, FREET4, T3FREE, THYROIDAB in the last 72 hours. Anemia Panel: Recent Labs    11/19/20 1540  FERRITIN 954*   Urine analysis:    Component Value Date/Time   COLORURINE YELLOW 11/14/2020 1657   APPEARANCEUR CLEAR 11/14/2020 1657   APPEARANCEUR Clear 11/13/2020 1136   LABSPEC 1.023 11/14/2020 1657   PHURINE 5.0 11/14/2020 1657   GLUCOSEU NEGATIVE 11/14/2020 1657   HGBUR NEGATIVE 11/14/2020 1657   BILIRUBINUR SMALL (A) 11/14/2020 1657  BILIRUBINUR Negative 11/13/2020 [place] 11/14/2020 1657   PROTEINUR NEGATIVE 11/14/2020 1657   UROBILINOGEN negative (A) 04/07/2020 1426   UROBILINOGEN 0.2 03/23/2012 2041   NITRITE NEGATIVE 11/14/2020 1657   LEUKOCYTESUR NEGATIVE 11/14/2020 1657    Radiological Exams on Admission - Personally Reviewed: DG Chest 1 View  Result Date: 11/19/2020 CLINICAL DATA:  COVID-19 EXAM: CHEST  1 VIEW COMPARISON:  11/14/2020 and prior. FINDINGS: Patchy right predominant bilateral pulmonary opacities. No pneumothorax or pleural effusion. Stable cardiomediastinal silhouette. No acute osseous abnormality. IMPRESSION: Multifocal pneumonia. Electronically Signed   By: Primitivo Gauze M.D.   On: 11/19/2020 13:25    Bedside telemetry: Personally reviewed.  Rhythm is sinus tachycardia with heart rate of 120 bpm.  Assessment/Plan Principal Problem:   COVID-19 virus infection   Patient presenting with shortness of breath, productive cough and generalized weakness having found to be positive for COVID-19 infection on 12/1  Patient is status post monoclonal antibody infusion on 12/4.  COVID-19 PCR testing again today is positive. Chest x-ray revealing bilateral multifocal  infiltrates.  Patient is additionally exhibiting acute hypoxic respiratory failure requiring supplemental oxygen via nasal cannula  Procalcitonin noted to be elevated at 1.27 concerning for bacterial coinfection.  Patient is also exhibiting multiple SIRS criteria as well as evidence of organ dysfunction (respiratory failure, encephalopathy and acute kidney injury) concerning for concurrent sepsis.  Treated patient with a combination of intravenous remdesivir, dexamethasone as well as intravenous ceftriaxone and azithromycin for the possibility of bacterial coinfection  Hydrating patient aggressively with intravenous isotonic fluids with 30 cc/kg followed by isotonic fluid infusion  Providing patient with bronchodilator therapy via MDI  Providing patient with zinc and vitamin C  Provided patient with as needed antitussives  Blood cultures and sputum cultures have been ordered  Admitting inpatient COVID-19 unit  Active Problems: Pneumonia of both lungs due to infectious organism  Bilateral multifocal viral pneumonia secondary to COVID-19  Superimposed bacterial infection likely due to elevated procalcitonin, possibly Streptococcus  Patient also has an ongoing substantial right upper lobe consolidation that has been ongoing for several months. Patient would likely benefit from CT imaging of the chest to better evaluate for the possibility of underlying malignancy once more clinically stable  Please see remainder of assessment and plan above.    Acute respiratory failure with hypoxia (HCC)   Please see assessment and plan above    Acute renal failure superimposed on stage 3a chronic kidney disease ([place])   Patient suffering from acute kidney injury with creatinine of 2.62, marked elevation compared to a baseline of 1.33  Hydrating patient with intravenous isotonic fluids  Strict input and output monitoring  Monitoring renal function and electrolytes with serial  chemistries  Avoiding nephrotoxic agents if at all possible    Acute metabolic encephalopathy   Patient exhibiting evidence of lethargy, confusion and disorientation on evaluation  Patient is felt to be seven from acute metabolic encephalopathy secondary to underlying sepsis/COVID-19 infection  Treating underlying infection as well as treating underlying hypoxia and hydrating patient with intravenous isotonic fluids  Monitoring for symptomatic improvement    Protein-calorie malnutrition ([place])   Patient has exhibited longstanding poor oral intake with borderline BMI of 19.48  Examination reveals temporal wasting with poor muscle tone  Providing patient with Ensure twice daily in between meals  Nutrition evaluation, the recommendations are appreciated    Iron deficiency anemia due to chronic blood loss   No evidence of active bleeding  Monitor hemoglobin and hematocrit  with serial CBCs    BPH without obstruction/lower urinary tract symptoms   Continue home regimen of Flomax    GERD without esophagitis    Continue home regimen of PPI twice daily   Code Status:  Full code -patient does actually state that he wants to be DNR during the interview however patient is visibly encephalopathic and per my review of previous hospitalizations patient has always been full code. I have attempted to call family to confirm patient is DNR status but have been unsuccessful in getting in contact with anyone. Family Communication: Attempts have been made to contact the daughter and have been unsuccessful as mentioned above.  Status is: Observation  The patient remains OBS appropriate and will d/c before 2 midnights.  Dispo: The patient is from: Home              Anticipated d/c is to: Home              Anticipated d/c date is: 2 days              Patient currently is not medically stable to d/c.        Vernelle Emerald MD [facility] Pager [phone]  If 7PM-7AM,  please contact night-coverage [email or web address] Use universal [place] password for that web site. If you do not have the password, please call the hospital operator.  11/19/2020, 7:33 PM

## 2020-11-19 NOTE — ED Triage Notes (Signed)
Pt back to the ED today stating that he is not feeling well , pt tested post ive for covid on the first had the antibody infusion on the 4th and states that he still feels bad today

## 2020-11-20 ENCOUNTER — Inpatient Hospital Stay (HOSPITAL_COMMUNITY): Payer: Medicare HMO

## 2020-11-20 DIAGNOSIS — Z87891 Personal history of nicotine dependence: Secondary | ICD-10-CM | POA: Diagnosis not present

## 2020-11-20 DIAGNOSIS — Z8546 Personal history of malignant neoplasm of prostate: Secondary | ICD-10-CM | POA: Diagnosis not present

## 2020-11-20 DIAGNOSIS — Z8249 Family history of ischemic heart disease and other diseases of the circulatory system: Secondary | ICD-10-CM | POA: Diagnosis not present

## 2020-11-20 DIAGNOSIS — G9341 Metabolic encephalopathy: Secondary | ICD-10-CM | POA: Diagnosis present

## 2020-11-20 DIAGNOSIS — Z85038 Personal history of other malignant neoplasm of large intestine: Secondary | ICD-10-CM | POA: Diagnosis not present

## 2020-11-20 DIAGNOSIS — R7989 Other specified abnormal findings of blood chemistry: Secondary | ICD-10-CM | POA: Diagnosis not present

## 2020-11-20 DIAGNOSIS — M7989 Other specified soft tissue disorders: Secondary | ICD-10-CM

## 2020-11-20 DIAGNOSIS — Z681 Body mass index (BMI) 19 or less, adult: Secondary | ICD-10-CM | POA: Diagnosis not present

## 2020-11-20 DIAGNOSIS — E46 Unspecified protein-calorie malnutrition: Secondary | ICD-10-CM | POA: Diagnosis present

## 2020-11-20 DIAGNOSIS — D500 Iron deficiency anemia secondary to blood loss (chronic): Secondary | ICD-10-CM | POA: Diagnosis present

## 2020-11-20 DIAGNOSIS — J1282 Pneumonia due to coronavirus disease 2019: Secondary | ICD-10-CM | POA: Diagnosis present

## 2020-11-20 DIAGNOSIS — B9562 Methicillin resistant Staphylococcus aureus infection as the cause of diseases classified elsewhere: Secondary | ICD-10-CM | POA: Diagnosis present

## 2020-11-20 DIAGNOSIS — Z833 Family history of diabetes mellitus: Secondary | ICD-10-CM | POA: Diagnosis not present

## 2020-11-20 DIAGNOSIS — Z66 Do not resuscitate: Secondary | ICD-10-CM | POA: Diagnosis not present

## 2020-11-20 DIAGNOSIS — I482 Chronic atrial fibrillation, unspecified: Secondary | ICD-10-CM | POA: Diagnosis present

## 2020-11-20 DIAGNOSIS — J9601 Acute respiratory failure with hypoxia: Secondary | ICD-10-CM | POA: Diagnosis present

## 2020-11-20 DIAGNOSIS — N138 Other obstructive and reflux uropathy: Secondary | ICD-10-CM | POA: Diagnosis present

## 2020-11-20 DIAGNOSIS — N179 Acute kidney failure, unspecified: Secondary | ICD-10-CM | POA: Diagnosis present

## 2020-11-20 DIAGNOSIS — R54 Age-related physical debility: Secondary | ICD-10-CM | POA: Diagnosis present

## 2020-11-20 DIAGNOSIS — N401 Enlarged prostate with lower urinary tract symptoms: Secondary | ICD-10-CM | POA: Diagnosis present

## 2020-11-20 DIAGNOSIS — K219 Gastro-esophageal reflux disease without esophagitis: Secondary | ICD-10-CM | POA: Diagnosis present

## 2020-11-20 DIAGNOSIS — J40 Bronchitis, not specified as acute or chronic: Secondary | ICD-10-CM | POA: Diagnosis present

## 2020-11-20 DIAGNOSIS — I129 Hypertensive chronic kidney disease with stage 1 through stage 4 chronic kidney disease, or unspecified chronic kidney disease: Secondary | ICD-10-CM | POA: Diagnosis present

## 2020-11-20 DIAGNOSIS — H9190 Unspecified hearing loss, unspecified ear: Secondary | ICD-10-CM | POA: Diagnosis present

## 2020-11-20 DIAGNOSIS — N1831 Chronic kidney disease, stage 3a: Secondary | ICD-10-CM | POA: Diagnosis present

## 2020-11-20 DIAGNOSIS — U071 COVID-19: Secondary | ICD-10-CM | POA: Diagnosis present

## 2020-11-20 LAB — CBC WITH DIFFERENTIAL/PLATELET
Abs Immature Granulocytes: 0.08 10*3/uL — ABNORMAL HIGH (ref 0.00–0.07)
Basophils Absolute: 0 10*3/uL (ref 0.0–0.1)
Basophils Relative: 0 %
Eosinophils Absolute: 0 10*3/uL (ref 0.0–0.5)
Eosinophils Relative: 0 %
HCT: 32.8 % — ABNORMAL LOW (ref 39.0–52.0)
Hemoglobin: 10.7 g/dL — ABNORMAL LOW (ref 13.0–17.0)
Immature Granulocytes: 1 %
Lymphocytes Relative: 3 %
Lymphs Abs: 0.2 10*3/uL — ABNORMAL LOW (ref 0.7–4.0)
MCH: 31.2 pg (ref 26.0–34.0)
MCHC: 32.6 g/dL (ref 30.0–36.0)
MCV: 95.6 fL (ref 80.0–100.0)
Monocytes Absolute: 0.4 10*3/uL (ref 0.1–1.0)
Monocytes Relative: 5 %
Neutro Abs: 7.6 10*3/uL (ref 1.7–7.7)
Neutrophils Relative %: 91 %
Platelets: 202 10*3/uL (ref 150–400)
RBC: 3.43 MIL/uL — ABNORMAL LOW (ref 4.22–5.81)
RDW: 13.9 % (ref 11.5–15.5)
WBC: 8.3 10*3/uL (ref 4.0–10.5)
nRBC: 0 % (ref 0.0–0.2)

## 2020-11-20 LAB — FIBRINOGEN: Fibrinogen: 746 mg/dL — ABNORMAL HIGH (ref 210–475)

## 2020-11-20 LAB — URIC ACID: Uric Acid, Serum: 8.4 mg/dL (ref 3.7–8.6)

## 2020-11-20 LAB — D-DIMER, QUANTITATIVE: D-Dimer, Quant: 2.69 ug/mL-FEU — ABNORMAL HIGH (ref 0.00–0.50)

## 2020-11-20 LAB — URINALYSIS, ROUTINE W REFLEX MICROSCOPIC
Bilirubin Urine: NEGATIVE
Glucose, UA: NEGATIVE mg/dL
Hgb urine dipstick: NEGATIVE
Ketones, ur: 5 mg/dL — AB
Leukocytes,Ua: NEGATIVE
Nitrite: NEGATIVE
Protein, ur: 30 mg/dL — AB
Specific Gravity, Urine: 1.021 (ref 1.005–1.030)
pH: 5 (ref 5.0–8.0)

## 2020-11-20 LAB — BRAIN NATRIURETIC PEPTIDE: B Natriuretic Peptide: 947.5 pg/mL — ABNORMAL HIGH (ref 0.0–100.0)

## 2020-11-20 LAB — COMPREHENSIVE METABOLIC PANEL
ALT: 24 U/L (ref 0–44)
AST: 29 U/L (ref 15–41)
Albumin: 2.6 g/dL — ABNORMAL LOW (ref 3.5–5.0)
Alkaline Phosphatase: 118 U/L (ref 38–126)
Anion gap: 13 (ref 5–15)
BUN: 57 mg/dL — ABNORMAL HIGH (ref 8–23)
CO2: 17 mmol/L — ABNORMAL LOW (ref 22–32)
Calcium: 9.2 mg/dL (ref 8.9–10.3)
Chloride: 109 mmol/L (ref 98–111)
Creatinine, Ser: 2.36 mg/dL — ABNORMAL HIGH (ref 0.61–1.24)
GFR, Estimated: 25 mL/min — ABNORMAL LOW (ref >60)
Glucose, Bld: 149 mg/dL — ABNORMAL HIGH (ref 70–99)
Potassium: 4.6 mmol/L (ref 3.5–5.1)
Sodium: 139 mmol/L (ref 135–145)
Total Bilirubin: 0.8 mg/dL (ref 0.3–1.2)
Total Protein: 5.9 g/dL — ABNORMAL LOW (ref 6.5–8.1)

## 2020-11-20 LAB — EXPECTORATED SPUTUM ASSESSMENT W GRAM STAIN, RFLX TO RESP C

## 2020-11-20 LAB — PROCALCITONIN: Procalcitonin: 1.12 ng/mL

## 2020-11-20 LAB — MRSA PCR SCREENING: MRSA by PCR: POSITIVE — AB

## 2020-11-20 LAB — MAGNESIUM: Magnesium: 1.7 mg/dL (ref 1.7–2.4)

## 2020-11-20 LAB — PROTIME-INR
INR: 1.2 (ref 0.8–1.2)
Prothrombin Time: 14.3 seconds (ref 11.4–15.2)

## 2020-11-20 LAB — OSMOLALITY: Osmolality: 311 mOsm/kg — ABNORMAL HIGH (ref 275–295)

## 2020-11-20 LAB — SODIUM, URINE, RANDOM: Sodium, Ur: 47 mmol/L

## 2020-11-20 LAB — APTT: aPTT: 37 seconds — ABNORMAL HIGH (ref 24–36)

## 2020-11-20 LAB — OSMOLALITY, URINE: Osmolality, Ur: 549 mOsm/kg (ref 300–900)

## 2020-11-20 LAB — C-REACTIVE PROTEIN: CRP: 32 mg/dL — ABNORMAL HIGH (ref <1.0)

## 2020-11-20 LAB — CREATININE, URINE, RANDOM: Creatinine, Urine: 110.53 mg/dL

## 2020-11-20 MED ORDER — ENSURE PO LIQD: 237.0000 mL | Freq: Two times a day (BID) | ORAL | Status: DC

## 2020-11-20 MED ORDER — CHLORHEXIDINE GLUCONATE CLOTH 2 % EX PADS
6.0000 | MEDICATED_PAD | Freq: Every day | CUTANEOUS | Status: DC
  Administered 2020-11-21 – 2020-11-23 (×3): 6 via TOPICAL

## 2020-11-20 MED ORDER — MIRABEGRON ER 50 MG PO TB24
50.0000 mg | ORAL_TABLET | Freq: Every day | ORAL | Status: DC
  Administered 2020-11-20 – 2020-11-23 (×4): 50 mg via ORAL
  Filled 2020-11-20 (×4): qty 1

## 2020-11-20 MED ORDER — POLYETHYLENE GLYCOL 3350 17 G PO PACK: 17.0000 g | PACK | Freq: Every day | ORAL | Status: DC | PRN

## 2020-11-20 MED ORDER — ENOXAPARIN SODIUM 30 MG/0.3ML ~~LOC~~ SOLN
30.0000 mg | SUBCUTANEOUS | Status: DC
  Administered 2020-11-20 – 2020-11-22 (×3): 30 mg via SUBCUTANEOUS
  Filled 2020-11-20 (×3): qty 0.3

## 2020-11-20 MED ORDER — ENOXAPARIN SODIUM 40 MG/0.4ML ~~LOC~~ SOLN: 40.0000 mg | Freq: Two times a day (BID) | SUBCUTANEOUS | Status: DC

## 2020-11-20 MED ORDER — BOOST PLUS PO LIQD
237.0000 mL | Freq: Three times a day (TID) | ORAL | Status: DC
  Administered 2020-11-20: 237 mL via ORAL
  Filled 2020-11-20 (×2): qty 237

## 2020-11-20 MED ORDER — MUPIROCIN 2 % EX OINT
1.0000 "application " | TOPICAL_OINTMENT | Freq: Two times a day (BID) | CUTANEOUS | Status: DC
  Administered 2020-11-20 – 2020-11-23 (×7): 1 via NASAL
  Filled 2020-11-20 (×3): qty 22

## 2020-11-20 MED ORDER — BOOST PLUS PO LIQD
237.0000 mL | Freq: Two times a day (BID) | ORAL | Status: DC
  Administered 2020-11-21 – 2020-11-23 (×3): 237 mL via ORAL
  Filled 2020-11-20 (×6): qty 237

## 2020-11-20 NOTE — Progress Notes (Addendum)
PROGRESS NOTE                                                                                                                                                                                                             Patient Demographics:    Tony Holt, is a 86 y.o. male, DOB - 02-02-1926, RFF:[id number]  Outpatient Primary MD for the patient is [name], MD    LOS - 0  Admit date - 11/19/2020    No chief complaint on file.      Brief Narrative (HPI from H&P) 86 year old male with past medical history of chronic kidney disease stage IIIa, gastroesophageal reflux disease, hypertension, benign prostatic hyperplasia, prostate cancer, chronic atrial fibrillation, iron deficiency anemia, cecal adenocarcinoma (Dx 01/2020) who presents to [facility] emergency department with complaints of generalized weakness shortness of breath and cough, he is unvaccinated for Covid and diagnosed with COVID-19 pneumonia with acute hypoxic respiratory failure admitted to the hospital.   Subjective:    Phillips Odor today has, No headache, No chest pain, No abdominal pain - No Nausea, No new weakness tingling or numbness,  No SOB, but unreliable historian.   Assessment  & Plan :     1. Acute Hypoxic Resp. Failure due to Acute Covid 19 Viral Pneumonitis during the ongoing 2020 Covid 19 Pandemic - he is unfortunately not vaccinated, he was diagnosed with COVID-19 infection on December 1, he has received antibody infusion in the outpatient setting with no improvement.  He is currently on combination of steroids and Remdesivir.  Poor candidate for Actemra due to advanced age and frail status.  Overall extremely tenuous we continue to monitor  Possible superimposed bacterial infection or pneumonia due to elevated procalcitonin.  Agree with antibiotics for now.  Monitor and assess.    Encouraged the patient to sit up in chair in the  daytime use I-S and flutter valve for pulmonary toiletry and then prone in bed when at night.  Will advance activity and titrate down oxygen as possible.  SpO2: 98 % O2 Flow Rate (L/min): 2 L/min  Recent Labs  Lab 11/14/20 1300 11/14/20 1737 11/19/20 1230 11/19/20 1540 11/19/20 1637 11/19/20 2126 11/20/20 0557  WBC 7.5  --  10.6*  --   --   --  8.3  HGB 11.6*  --  11.3*  --   --   --  10.7*  HCT 36.3*  --  35.9*  --   --   --  32.8*  PLT 144*  --  191  --   --   --  202  CRP  --   --   --  35.4*  --   --   --   DDIMER  --   --   --   --   --   --  2.69*  PROCALCITON  --   --   --  1.27  --   --   --   AST  --  28 19  --   --   --   --   ALT  --  23 20  --   --   --   --   ALKPHOS  --  79 118  --   --   --   --   BILITOT  --  0.7 0.7  --   --   --   --   ALBUMIN  --  3.8 3.0*  --   --   --   --   INR  --   --   --   --   --   --  1.2  LATICACIDVEN  --  1.3  --   --  1.8 1.6  --   SARSCOV2NAA  --   --   --  POSITIVE*  --   --   --     2.  Moderately elevated D-dimer.  Likely due to inflammation for COVID-19, Lovenox dose increased to intermediate will check leg ultrasound.  3.  Advanced age, deconditioning, frail status, moderate protein calorie malnutrition.  Supportive care, PT-OT, protein supplements, at risk for delirium.  Monitor closely.   4.  GERD.  PPI.  5.  BPH.  On Flomax.  6.  AKI on top of CKD stage 3B.  Baseline creatinine around 1.5.  Hydrate, check renal ultrasound and electrolytes.      Condition - Extremely Guarded  Family Communication  :  Daughter Vickii Chafe [phone] 11/20/20  Code Status : DNR  Consults  :  None  Procedures  :  None  PUD Prophylaxis : PPI  Disposition Plan  :    Status is: Inpt  Dispo: The patient is from: Home              Anticipated d/c is to: Home              Anticipated d/c date is: 3 days              Patient currently is not medically stable to d/c.   DVT Prophylaxis  :  Lovenox   Lab Results  Component  Value Date   PLT 202 11/20/2020    Diet :  Diet Order            Diet Heart Room service appropriate? Yes; Fluid consistency: Thin  Diet effective now                  Inpatient Medications  Scheduled Meds: . albuterol  2 puff Inhalation Q6H  . vitamin C  500 mg Oral Daily  . aspirin EC  81 mg Oral Daily  . dexamethasone (DECADRON) injection  6 mg Intravenous Q24H  . enoxaparin (LOVENOX) injection  30 mg Subcutaneous Q24H  . feeding supplement  237 mL Oral BID BM  . mirabegron ER  50 mg Oral Daily  . pantoprazole  40 mg  Oral BID AC  . tamsulosin  0.4 mg Oral BID  . zinc sulfate  220 mg Oral Daily   Continuous Infusions: . sodium chloride 75 mL/hr at 11/19/20 2306  . azithromycin Stopped (11/20/20 0040)  . cefTRIAXone (ROCEPHIN)  IV Stopped (11/19/20 2253)  . remdesivir 100 mg in NS 100 mL     PRN Meds:.acetaminophen, albuterol, docusate sodium, guaiFENesin-dextromethorphan, HYDROcodone-acetaminophen, [DISCONTINUED] ondansetron **OR** ondansetron (ZOFRAN) IV, polyethylene glycol  Antibiotics  :    Anti-infectives (From admission, onward)   Start     Dose/Rate Route Frequency Ordered Stop   11/20/20 1000  remdesivir 100 mg in sodium chloride 0.9 % 100 mL IVPB       "Followed by" Linked Group Details   100 mg 200 mL/hr over 30 Minutes Intravenous Daily 11/19/20 1928 11/24/20 0959   11/19/20 2030  remdesivir 200 mg in sodium chloride 0.9% 250 mL IVPB       "Followed by" Linked Group Details   200 mg 580 mL/hr over 30 Minutes Intravenous Once 11/19/20 1928 11/20/20 0218   11/19/20 2000  cefTRIAXone (ROCEPHIN) 2 g in sodium chloride 0.9 % 100 mL IVPB        2 g 200 mL/hr over 30 Minutes Intravenous Every 24 hours 11/19/20 1917 11/24/20 1959   11/19/20 2000  azithromycin (ZITHROMAX) 500 mg in sodium chloride 0.9 % 250 mL IVPB        500 mg 250 mL/hr over 60 Minutes Intravenous Every 24 hours 11/19/20 1917 11/24/20 1959       Time Spent in minutes  30   Lala Lund M.D on 11/20/2020 at 6:59 AM  To page go to [email or web address]   [facility] -  Office  [phone]  See all Orders from today for further details    Objective:   Vitals:   11/19/20 2300 11/20/20 0000 11/20/20 0200 11/20/20 0418  BP: 124/79 114/71 124/73 131/73  Pulse: (!) 105 64 92 89  Resp: (!) 27 (!) 24 16 16   Temp:   (!) 97.4 F (36.3 C) 97.7 F (36.5 C)  TempSrc:   Oral Axillary  SpO2: 100% 98%  98%    Wt Readings from Last 3 Encounters:  11/13/20 59 kg  10/16/20 59.1 kg  09/04/20 62.4 kg     Intake/Output Summary (Last 24 hours) at 11/20/2020 0659 Last data filed at 11/20/2020 0040 Gross per 24 hour  Intake 2350 ml  Output --  Net 2350 ml     Physical Exam  Awake but confused, No new F.N deficits,  [place].AT,PERRAL Supple Neck,No JVD, No cervical lymphadenopathy appriciated.  Symmetrical Chest wall movement, Good air movement bilaterally, CTAB RRR,No Gallops,Rubs or new Murmurs, No Parasternal Heave +ve B.Sounds, Abd Soft, No tenderness, No organomegaly appriciated, No rebound - guarding or rigidity. No Cyanosis, Clubbing or edema, No new Rash or bruise      Data Review:    CBC Recent Labs  Lab 11/14/20 1300 11/19/20 1230 11/20/20 0557  WBC 7.5 10.6* 8.3  HGB 11.6* 11.3* 10.7*  HCT 36.3* 35.9* 32.8*  PLT 144* 191 202  MCV 98.6 97.6 95.6  MCH 31.5 30.7 31.2  MCHC 32.0 31.5 32.6  RDW 13.4 13.6 13.9  LYMPHSABS  --   --  PENDING  MONOABS  --   --  PENDING  EOSABS  --   --  PENDING  BASOSABS  --   --  PENDING    Recent Labs  Lab 11/14/20 1300 11/14/20 1737 11/19/20 1230 11/19/20  1540 11/19/20 1637 11/19/20 2126 11/20/20 0557  NA 135  --  134*  --   --   --   --   K 5.2*  --  4.4  --   --   --   --   CL 105  --  104  --   --   --   --   CO2 18*  --  16*  --   --   --   --   GLUCOSE 111*  --  124*  --   --   --   --   BUN 48*  --  55*  --   --   --   --   CREATININE 2.22*  --  2.62*  --   --   --   --   CALCIUM 9.2  --  9.3  --    --   --   --   AST  --  28 19  --   --   --   --   ALT  --  23 20  --   --   --   --   ALKPHOS  --  79 118  --   --   --   --   BILITOT  --  0.7 0.7  --   --   --   --   ALBUMIN  --  3.8 3.0*  --   --   --   --   CRP  --   --   --  35.4*  --   --   --   DDIMER  --   --   --   --   --   --  2.69*  PROCALCITON  --   --   --  1.27  --   --   --   LATICACIDVEN  --  1.3  --   --  1.8 1.6  --   INR  --   --   --   --   --   --  1.2    ------------------------------------------------------------------------------------------------------------------ Recent Labs    11/19/20 1637  TRIG 89    Lab Results  Component Value Date   HGBA1C (H) 02/08/2011    5.7 (NOTE)                                                                       According to the ADA Clinical Practice Recommendations for 2011, when HbA1c is used as a screening test:   >=6.5%   Diagnostic of Diabetes Mellitus           (if abnormal result  is confirmed)  5.7-6.4%   Increased risk of developing Diabetes Mellitus  References:Diagnosis and Classification of Diabetes Mellitus,Diabetes Care,2011,34(Suppl 1):S62-S69 and Standards of Medical Care in         Diabetes - 2011,Diabetes Care,2011,34  (Suppl 1):S11-S61.   ------------------------------------------------------------------------------------------------------------------ No results for input(s): TSH, T4TOTAL, T3FREE, THYROIDAB in the last 72 hours.  Invalid input(s): FREET3  Cardiac Enzymes No results for input(s): CKMB, TROPONINI, MYOGLOBIN in the last 168 hours.  Invalid input(s): CK ------------------------------------------------------------------------------------------------------------------    Component Value Date/Time   BNP 475.0 (H) 10/01/2020 1208    Micro Results Recent  Results (from the past 240 hour(s))  Microscopic Examination     Status: Abnormal   Collection Time: 11/13/20 11:36 AM   Urine  Result Value Ref Range Status   WBC, UA 0-5 0 - 5  /hpf Final   RBC 3-10 (A) 0 - 2 /hpf Final   Epithelial Cells (non renal) 0-10 0 - 10 /hpf Final   Renal Epithel, UA None seen None seen /hpf Final   Mucus, UA Present Not Estab. Final   Bacteria, UA Few None seen/Few Final  Urine culture     Status: Abnormal   Collection Time: 11/14/20  4:57 PM   Specimen: Urine, Random  Result Value Ref Range Status   Specimen Description URINE, RANDOM  Final   Special Requests   Final    NONE Performed at [facility], [address], [place], [place] [address]    Culture MULTIPLE SPECIES PRESENT, SUGGEST RECOLLECTION (A)  Final   Report Status 11/16/2020 FINAL  Final  Resp Panel by RT-PCR (Flu A&B, Covid) Nasopharyngeal Swab     Status: Abnormal   Collection Time: 11/19/20  3:40 PM   Specimen: Nasopharyngeal Swab; Nasopharyngeal(NP) swabs in vial transport medium  Result Value Ref Range Status   SARS Coronavirus 2 by RT PCR POSITIVE (A) NEGATIVE Final    Comment: RESULT CALLED TO, READ BACK BY AND VERIFIED WITH: Corlis Leak RN 11/19/20 AT 1805 SK  (NOTE) SARS-CoV-2 target nucleic acids are DETECTED.  The SARS-CoV-2 RNA is generally detectable in upper respiratory specimens during the acute phase of infection. Positive results are indicative of the presence of the identified virus, but do not rule out bacterial infection or co-infection with other pathogens not detected by the test. Clinical correlation with patient history and other diagnostic information is necessary to determine patient infection status. The expected result is Negative.  Fact Sheet for Patients: EntrepreneurPulse.com.au  Fact Sheet for Healthcare Providers: IncredibleEmployment.be  This test is not yet approved or cleared by the [place] FDA and  has been authorized for detection and/or diagnosis of SARS-CoV-2 by FDA under an Emergency Use Authorization (EUA).  This EUA will remain in effect (meaning this test can be  used)  for the duration of  the COVID-19 declaration under Section 564(b)(1) of the Act, 21 U.S.C. section 360bbb-3(b)(1), unless the authorization is terminated or revoked sooner.     Influenza A by PCR NEGATIVE NEGATIVE Final   Influenza B by PCR NEGATIVE NEGATIVE Final    Comment: (NOTE) The Xpert Xpress SARS-CoV-2/FLU/RSV plus assay is intended as an aid in the diagnosis of influenza from Nasopharyngeal swab specimens and should not be used as a sole basis for treatment. Nasal washings and aspirates are unacceptable for Xpert Xpress SARS-CoV-2/FLU/RSV testing.  Fact Sheet for Patients: EntrepreneurPulse.com.au  Fact Sheet for Healthcare Providers: IncredibleEmployment.be  This test is not yet approved or cleared by the [place] FDA and has been authorized for detection and/or diagnosis of SARS-CoV-2 by FDA under an Emergency Use Authorization (EUA). This EUA will remain in effect (meaning this test can be used) for the duration of the COVID-19 declaration under Section 564(b)(1) of the Act, 21 U.S.C. section 360bbb-3(b)(1), unless the authorization is terminated or revoked.  Performed at [facility], [place] [address] [place],  [address]     Radiology Reports DG Chest 1 View  Result Date: 11/19/2020 CLINICAL DATA:  COVID-19 EXAM: CHEST  1 VIEW COMPARISON:  11/14/2020 and prior. FINDINGS: Patchy right predominant bilateral  pulmonary opacities. No pneumothorax or pleural effusion. Stable cardiomediastinal silhouette. No acute osseous abnormality. IMPRESSION: Multifocal pneumonia. Electronically Signed   By: Primitivo Gauze M.D.   On: 11/19/2020 13:25   DG Chest 2 View  Result Date: 10/30/2020 CLINICAL DATA:  Follow-up pneumonia. Shortness of breath. EXAM: CHEST - 2 VIEW COMPARISON:  Radiograph 10/01/2020. Chest CT 09/04/2020. Lung bases from abdominal CT 10/09/2020 FINDINGS: Improved lung volumes from prior exam. Previous  right upper lobe opacities are not definitively seen on the current exam. The heart is normal in size. Stable aortic tortuosity and atherosclerosis. Increased AP diameter of the thorax. Possible small pleural effusions. Coarse lung markings with improvement in vascular congestion. No convincing pulmonary edema or Kerley B-lines. No pneumothorax. Bones are under mineralized. Chronic compression fractures in the mid and lower thoracic spine. IMPRESSION: 1. Improved lung volumes. Previous right upper lobe opacities are not definitively seen on the current exam. 2. Possible small pleural effusions. 3. Improved vascular congestion. Electronically Signed   By: Keith Rake M.D.   On: 10/30/2020 10:28   DG Chest Portable 1 View  Result Date: 11/14/2020 CLINICAL DATA:  COVID-19 positive.  Weakness. EXAM: PORTABLE CHEST 1 VIEW COMPARISON:  10/29/2020 FINDINGS: The heart size and mediastinal contours are within normal limits. Both lungs are clear. The visualized skeletal structures are unremarkable. IMPRESSION: No active disease. Electronically Signed   By: Franchot Gallo M.D.   On: 11/14/2020 13:44

## 2020-11-20 NOTE — Evaluation (Signed)
Occupational Therapy Evaluation Patient Details Name: Tony Holt MRN: [id number] DOB: August 15, 1926 Today's Date: 11/20/2020    History of Present Illness 86 year old male with past medical history of CKD IIIa, HTN, prostate cancer, CAF, anemia, cecal adenocarcinoma (Dx 01/2020), and inferior pubic ramus fx 2 months ago who presented 11/19/20 to ED with complaints of generalized weakness shortness of breath and cough. +encephalopathy due to COVID-19 infection (diagnosed 11/12/20)   Clinical Impression   PTA pt living with daughter, requiring as needed assist for ADLs and using RW for mobility. At time of eval, pt able to complete sit <> stand transfers with min A +2 and RW. He is currently limited by generalized weakness, poor activity tolerance, and cardiopulmonary deficits. He fatigues easily. Once up in chair pt was better able to mobilize secretions. Pt on 2L [place] with SpO2 stable throughout session. Given current status, believe could go home if daughter is able to provide 24/7 physical assist. If not, pt will need SNF to progress ADLs prior to returning home. Will continue to follow per POC listed below.    Follow Up Recommendations  SNF;Supervision/Assistance - 24 hour (unless family can provide 24/7 care)    Equipment Recommendations  3 in 1 bedside commode;Wheelchair (measurements OT);Wheelchair cushion (measurements OT)    Recommendations for Other Services       Precautions / Restrictions Precautions Precautions: Fall Precaution Comments: hx of pubic rami fx 2 mos ago Restrictions Weight Bearing Restrictions: No      Mobility Bed Mobility Overal bed mobility: Needs Assistance             General bed mobility comments: sitting EOB with PT on arrival    Transfers Overall transfer level: Needs assistance Equipment used: Rolling walker (2 wheeled) Transfers: Sit to/from [facility] Sit to Stand: Min assist Stand pivot transfers: Min assist;+2  physical assistance;+2 safety/equipment       General transfer comment: min A assist to power up and steady with use of RW; min A +2 to safely pivot over to recliner    Balance Overall balance assessment: Needs assistance Sitting-balance support: Bilateral upper extremity supported;Feet supported Sitting balance-Leahy Scale: Good     Standing balance support: Bilateral upper extremity supported;During functional activity Standing balance-Leahy Scale: Poor Standing balance comment: reliant on external support and staff assist                           ADL either performed or assessed with clinical judgement   ADL Overall ADL's : Needs assistance/impaired Eating/Feeding: Set up;Sitting   Grooming: Set up;Sitting   Upper Body Bathing: Minimal assistance;Sitting   Lower Body Bathing: Moderate assistance;Maximal assistance;Sitting/lateral leans;Sit to/from stand   Upper Body Dressing : Set up;Sitting   Lower Body Dressing: Moderate assistance;Maximal assistance;Sitting/lateral leans;Sit to/from stand   Toilet Transfer: Minimal assistance;+2 for physical assistance;+2 for safety/equipment;Stand-pivot;BSC;RW   Toileting- Clothing Manipulation and Hygiene: Moderate assistance;Sit to/from stand       Functional mobility during ADLs: Minimal assistance;+2 for physical assistance;+2 for safety/equipment;Rolling walker       Vision Patient Visual Report: No change from baseline       Perception     Praxis      Pertinent Vitals/Pain Pain Assessment: No/denies pain     Hand Dominance     Extremity/Trunk Assessment Upper Extremity Assessment Upper Extremity Assessment: Generalized weakness   Lower Extremity Assessment Lower Extremity Assessment: Generalized weakness       Communication Communication  Communication: HOH   Cognition Arousal/Alertness: Awake/alert Behavior During Therapy: WFL for tasks assessed/performed Overall Cognitive Status:  Difficult to assess                                 General Comments: Pt HOH making fulll cognitive assessment difficult. Pt was able to follow simple commands and problem solve appropriately for session   General Comments       Exercises     Shoulder Instructions      Home Living Family/patient expects to be discharged to:: Private residence Living Arrangements: Children (daughter) Available Help at Discharge: Family;Available 24 hours/day Type of Home: House Home Access: Level entry     Home Layout: One level     Bathroom Shower/Tub: Tub/shower unit;Door         Home Equipment: Kasandra Knudsen - single point;Shower seat;Bedside commode;Walker - 2 wheels          Prior Functioning/Environment Level of Independence: Needs assistance  Gait / Transfers Assistance Needed: using RW for mobility ADL's / Homemaking Assistance Needed: daughter having to assist with ADLs            OT Problem List: Decreased strength;Decreased knowledge of use of DME or AE;Decreased activity tolerance;Cardiopulmonary status limiting activity;Impaired balance (sitting and/or standing)      OT Treatment/Interventions: Self-care/ADL training;Therapeutic exercise;Patient/family education;Balance training;Energy conservation;Therapeutic activities;DME and/or AE instruction    OT Goals(Current goals can be found in the care plan section) Acute Rehab OT Goals Patient Stated Goal: beat this virus OT Goal Formulation: With patient Time For Goal Achievement: 12/04/20 Potential to Achieve Goals: Good  OT Frequency: Min 2X/week   Barriers to D/C:            Co-evaluation PT/OT/SLP Co-Evaluation/Treatment: Yes Reason for Co-Treatment: For patient/therapist safety;To address functional/ADL transfers   OT goals addressed during session: ADL's and self-care;Proper use of Adaptive equipment and DME      AM-PAC OT "6 Clicks" Daily Activity     Outcome Measure Help from another person  eating meals?: A Little Help from another person taking care of personal grooming?: A Little Help from another person toileting, which includes using toliet, bedpan, or urinal?: A Lot Help from another person bathing (including washing, rinsing, drying)?: A Lot Help from another person to put on and taking off regular upper body clothing?: A Little Help from another person to put on and taking off regular lower body clothing?: A Lot 6 Click Score: 15   End of Session Equipment Utilized During Treatment: Gait belt;Rolling walker;Oxygen Nurse Communication: Mobility status  Activity Tolerance: Patient tolerated treatment well Patient left: in chair;with call bell/phone within reach;with chair alarm set  OT Visit Diagnosis: Unsteadiness on feet (R26.81);Other abnormalities of gait and mobility (R26.89);Muscle weakness (generalized) (M62.81)                Time: 7672-0947 OT Time Calculation (min): 16 min Charges:  OT General Charges $OT Visit: 1 Visit OT Evaluation $OT Eval Moderate Complexity: [place], MSOT, OTR/L [facility] Office Number: [phone] Pager: [phone]  Zenovia Jarred 11/20/2020, 6:14 PM

## 2020-11-20 NOTE — Progress Notes (Signed)
Bilateral lower extremity venous study completed.      Please see CV Proc for preliminary results.   Terianne Thaker, RVT  

## 2020-11-20 NOTE — Progress Notes (Signed)
Initial Nutrition Assessment  DOCUMENTATION CODES:   Not applicable  INTERVENTION:  Provide Ensure Enlive po BID, each supplement provides 350 kcal and 20 grams of protein  Provide Boost Plus po BID, each supplement provides 360 kcal and 14 grams of protein.   Encourage adequate PO intake.   NUTRITION DIAGNOSIS:   Increased nutrient needs related to catabolic illness (COVID) as evidenced by estimated needs.  GOAL:   Patient will meet greater than or equal to 90% of their needs  MONITOR:   PO intake,Supplement acceptance,Skin,Weight trends,Labs,I & O's  REASON FOR ASSESSMENT:   Consult Assessment of nutrition requirement/status  ASSESSMENT:   86 year old male with past medical history of chronic kidney disease stage IIIa, gastroesophageal reflux disease, hypertension, benign prostatic hyperplasia, prostate cancer, chronic atrial fibrillation, iron deficiency anemia, cecal adenocarcinoma (Dx 01/2020) presents with generalized weakness shortness of breath and cough. Pt diagnosed with COVID-19 pneumonia with acute hypoxic respiratory failure   Pt poor historian due to encephalopathy from COVID. PO intake poor per MD. Pt currently has Ensure and Boost Plus ordered and has been consuming them. RD to continue with nutritional supplementation to aid in caloric and protein needs. Pt with no significant weight loss per weight records. Unable to complete Nutrition-Focused physical exam at this time.   Labs and medications reviewed.   Diet Order:   Diet Order            Diet Heart Room service appropriate? Yes; Fluid consistency: Thin  Diet effective now                 EDUCATION NEEDS:   Not appropriate for education at this time  Skin:  Skin Assessment: Reviewed RN Assessment  Last BM:  Unknown  Height:   Ht Readings from Last 1 Encounters:  11/13/20 5' 8.5" (1.74 m)    Weight:   Wt Readings from Last 1 Encounters:  11/13/20 59 kg   Estimated Nutritional Needs:    Kcal:  1500-1800  Protein:  70-85 grams  Fluid:  >/= 1.5 L/day  Corrin Parker, MS, RD, LDN RD pager number/after hours weekend pager number on Amion.

## 2020-11-20 NOTE — Evaluation (Signed)
Physical Therapy Evaluation Patient Details Name: Tony Holt MRN: [id number] DOB: [birth year] Today's Date: 11/20/2020   History of Present Illness  86 year old male with past medical history of CKD IIIa, HTN, prostate cancer, CAF, anemia, cecal adenocarcinoma (Dx 01/2020), and inferior pubic ramus fx 2 months ago who presented 11/19/20 to ED with complaints of generalized weakness shortness of breath and cough. +encephalopathy due to COVID-19 infection (diagnosed 11/12/20)  Clinical Impression   Pt admitted with above diagnosis. Per pt, he was walking with RW vs cane with supervision by his daughter prior to illness and recent decline (she had begun to help him more with his mobility for days PTA). Currently he was able to mobilize OOB to chair using RW with min assist, however was very fatigued and refused to attempt further walking to go to the sink and perform an ADL with OT. Pt currently with functional limitations due to the deficits listed below (see PT Problem List). Pt will benefit from skilled PT to increase their independence and safety with mobility to allow discharge to the venue listed below.       Follow Up Recommendations Home health PT;Supervision/Assistance - 24 hour (if daughter can't provide 24/7 then will need SNF)    Equipment Recommendations    None   Recommendations for Other Services       Precautions / Restrictions Precautions Precautions: Fall Precaution Comments: hx of pubic rami fx 2 mos ago Restrictions Weight Bearing Restrictions: No      Mobility  Bed Mobility Overal bed mobility: Needs Assistance Bed Mobility: Supine to Sit     Supine to sit: Min assist     General bed mobility comments: pt asking to be pulled up, however once directed to take his legs over EOB, he raised his torso with only min assist    Transfers Overall transfer level: Needs assistance Equipment used: Rolling walker (2 wheeled) Transfers: Sit to/from [employer] Sit to Stand: Min assist Stand pivot transfers: Min assist;+2 physical assistance;+2 safety/equipment       General transfer comment: min A assist to power up and steady with use of RW; min A +2 to safely pivot over to recliner  Ambulation/Gait             General Gait Details: pivotal steps to chair  Stairs            Wheelchair Mobility    Modified Rankin (Stroke Patients Only)       Balance Overall balance assessment: Needs assistance Sitting-balance support: Feet supported Sitting balance-Leahy Scale: Good     Standing balance support: Bilateral upper extremity supported;During functional activity Standing balance-Leahy Scale: Poor Standing balance comment: reliant on external support and staff assist                             Pertinent Vitals/Pain Pain Assessment: Faces Faces Pain Scale: No hurt    Home Living Family/patient expects to be discharged to:: Private residence Living Arrangements: Children (daughter) Available Help at Discharge: Family;Available 24 hours/day Type of Home: House Home Access: Level entry     Home Layout: One level Home Equipment: Cane - single point;Shower seat;Bedside commode;Walker - 2 wheels      Prior Function Level of Independence: Needs assistance   Gait / Transfers Assistance Needed: using RW vs cane for mobility; prior to recent illness modified independent and then dtr began helping him walk  ADL's / Homemaking Assistance Needed:  daughter having to assist with ADLs        Hand Dominance        Extremity/Trunk Assessment   Upper Extremity Assessment Upper Extremity Assessment: Defer to OT evaluation    Lower Extremity Assessment Lower Extremity Assessment: Generalized weakness (AROM bil LEs WFL; strength supine 3+ to 4)    Cervical / Trunk Assessment Cervical / Trunk Assessment: Normal  Communication   Communication: HOH  Cognition Arousal/Alertness:  Awake/alert Behavior During Therapy: WFL for tasks assessed/performed Overall Cognitive Status: Difficult to assess                                 General Comments: Pt HOH making fulll cognitive assessment difficult. Pt was able to follow simple commands and problem solve appropriately for session      General Comments      Exercises     Assessment/Plan    PT Assessment Patient needs continued PT services  PT Problem List Decreased strength;Decreased activity tolerance;Decreased balance;Decreased mobility;Decreased knowledge of use of DME;Cardiopulmonary status limiting activity       PT Treatment Interventions DME instruction;Gait training;Functional mobility training;Therapeutic activities;Therapeutic exercise;Balance training;Patient/family education    PT Goals (Current goals can be found in the Care Plan section)  Acute Rehab PT Goals Patient Stated Goal: beat this virus PT Goal Formulation: With patient Time For Goal Achievement: 12/04/20 Potential to Achieve Goals: Good    Frequency Min 3X/week   Barriers to discharge        Co-evaluation PT/OT/SLP Co-Evaluation/Treatment: Yes Reason for Co-Treatment: Other (comment);To address functional/ADL transfers;For patient/therapist safety (due to pt's limited CP status) PT goals addressed during session: Mobility/safety with mobility;Proper use of DME;Strengthening/ROM OT goals addressed during session: ADL's and self-care;Proper use of Adaptive equipment and DME       AM-PAC PT "6 Clicks" Mobility  Outcome Measure Help needed turning from your back to your side while in a flat bed without using bedrails?: A Little Help needed moving from lying on your back to sitting on the side of a flat bed without using bedrails?: A Little Help needed moving to and from a bed to a chair (including a wheelchair)?: A Little Help needed standing up from a chair using your arms (e.g., wheelchair or bedside chair)?: A  Little Help needed to walk in hospital room?: A Little Help needed climbing 3-5 steps with a railing? : A Lot 6 Click Score: 17    End of Session Equipment Utilized During Treatment: Gait belt;Oxygen Activity Tolerance: Patient limited by fatigue Patient left: in chair;with call bell/phone within reach;with chair alarm set Nurse Communication: Mobility status PT Visit Diagnosis: Muscle weakness (generalized) (M62.81);History of falling (Z91.81);Difficulty in walking, not elsewhere classified (R26.2)    Time: 3893-7342 PT Time Calculation (min) (ACUTE ONLY): 41 min   Charges:   PT Evaluation $PT Eval Moderate Complexity: 1 Mod PT Treatments $Therapeutic Activity: 8-22 mins         Arby Barrette, PT Pager [phone]   Rexanne Mano 11/20/2020, 6:41 PM

## 2020-11-21 ENCOUNTER — Inpatient Hospital Stay (HOSPITAL_COMMUNITY): Payer: Medicare HMO

## 2020-11-21 LAB — CBC WITH DIFFERENTIAL/PLATELET
Abs Immature Granulocytes: 0.16 10*3/uL — ABNORMAL HIGH (ref 0.00–0.07)
Basophils Absolute: 0 10*3/uL (ref 0.0–0.1)
Basophils Relative: 0 %
Eosinophils Absolute: 0 10*3/uL (ref 0.0–0.5)
Eosinophils Relative: 0 %
HCT: 30.4 % — ABNORMAL LOW (ref 39.0–52.0)
Hemoglobin: 10.1 g/dL — ABNORMAL LOW (ref 13.0–17.0)
Immature Granulocytes: 2 %
Lymphocytes Relative: 3 %
Lymphs Abs: 0.3 10*3/uL — ABNORMAL LOW (ref 0.7–4.0)
MCH: 31 pg (ref 26.0–34.0)
MCHC: 33.2 g/dL (ref 30.0–36.0)
MCV: 93.3 fL (ref 80.0–100.0)
Monocytes Absolute: 0.5 10*3/uL (ref 0.1–1.0)
Monocytes Relative: 5 %
Neutro Abs: 9.2 10*3/uL — ABNORMAL HIGH (ref 1.7–7.7)
Neutrophils Relative %: 90 %
Platelets: 233 10*3/uL (ref 150–400)
RBC: 3.26 MIL/uL — ABNORMAL LOW (ref 4.22–5.81)
RDW: 13.9 % (ref 11.5–15.5)
WBC: 10.1 10*3/uL (ref 4.0–10.5)
nRBC: 0 % (ref 0.0–0.2)

## 2020-11-21 LAB — COMPREHENSIVE METABOLIC PANEL
ALT: 24 U/L (ref 0–44)
AST: 26 U/L (ref 15–41)
Albumin: 2.5 g/dL — ABNORMAL LOW (ref 3.5–5.0)
Alkaline Phosphatase: 103 U/L (ref 38–126)
Anion gap: 14 (ref 5–15)
BUN: 67 mg/dL — ABNORMAL HIGH (ref 8–23)
CO2: 14 mmol/L — ABNORMAL LOW (ref 22–32)
Calcium: 9.1 mg/dL (ref 8.9–10.3)
Chloride: 107 mmol/L (ref 98–111)
Creatinine, Ser: 2.04 mg/dL — ABNORMAL HIGH (ref 0.61–1.24)
GFR, Estimated: 30 mL/min — ABNORMAL LOW (ref >60)
Glucose, Bld: 147 mg/dL — ABNORMAL HIGH (ref 70–99)
Potassium: 5 mmol/L (ref 3.5–5.1)
Sodium: 135 mmol/L (ref 135–145)
Total Bilirubin: 0.7 mg/dL (ref 0.3–1.2)
Total Protein: 5.6 g/dL — ABNORMAL LOW (ref 6.5–8.1)

## 2020-11-21 LAB — MAGNESIUM: Magnesium: 1.9 mg/dL (ref 1.7–2.4)

## 2020-11-21 LAB — D-DIMER, QUANTITATIVE: D-Dimer, Quant: 2.16 ug/mL-FEU — ABNORMAL HIGH (ref 0.00–0.50)

## 2020-11-21 LAB — C-REACTIVE PROTEIN: CRP: 21.7 mg/dL — ABNORMAL HIGH (ref <1.0)

## 2020-11-21 LAB — BRAIN NATRIURETIC PEPTIDE: B Natriuretic Peptide: 760.5 pg/mL — ABNORMAL HIGH (ref 0.0–100.0)

## 2020-11-21 LAB — PROCALCITONIN: Procalcitonin: 0.95 ng/mL

## 2020-11-21 MED ORDER — SODIUM POLYSTYRENE SULFONATE 15 GM/60ML PO SUSP
15.0000 g | Freq: Once | ORAL | Status: AC
  Administered 2020-11-21: 15 g via ORAL
  Filled 2020-11-21: qty 60

## 2020-11-21 NOTE — Plan of Care (Signed)
Patient is currently resting in bed. C/o pain back pain this AM, given norco PRN. VSS. Remains on 2L [place]. OOB to Chair- 2 assist. Encouraged eating more and drinking ensure. All patient wants is milk and ice water. Call bell within reach. Bed alarm on.   Problem: Education: Goal: Knowledge of risk factors and measures for prevention of condition will improve Outcome: Progressing   Problem: Coping: Goal: Psychosocial and spiritual needs will be supported Outcome: Progressing   Problem: Respiratory: Goal: Will maintain a patent airway Outcome: Progressing Goal: Complications related to the disease process, condition or treatment will be avoided or minimized Outcome: Progressing   Problem: Education: Goal: Knowledge of risk factors and measures for prevention of condition will improve Outcome: Progressing

## 2020-11-21 NOTE — Progress Notes (Addendum)
PROGRESS NOTE                                                                                                                                                                                                             Patient Demographics:    Tony Holt, is a 86 y.o. male, DOB - Feb 04, 1926, XBL:[id number]  Outpatient Primary MD for the patient is [name], MD    LOS - 1  Admit date - 11/19/2020    No chief complaint on file.      Brief Narrative (HPI from H&P) 86 year old male with past medical history of chronic kidney disease stage IIIa, gastroesophageal reflux disease, hypertension, benign prostatic hyperplasia, prostate cancer, chronic atrial fibrillation, iron deficiency anemia, cecal adenocarcinoma (Dx 01/2020) who presents to [facility] emergency department with complaints of generalized weakness shortness of breath and cough, he is unvaccinated for Covid and diagnosed with COVID-19 pneumonia with acute hypoxic respiratory failure admitted to the hospital.   Subjective:    Patient in bed, appears comfortable, denies any headache, no fever, no chest pain or pressure, no shortness of breath , no abdominal pain. No focal weakness, but unreliable historian.   Assessment  & Plan :     1. Acute Hypoxic Resp. Failure due to Acute Covid 19 Viral Pneumonitis during the ongoing 2020 Covid 19 Pandemic - he is unfortunately not vaccinated, he was diagnosed with COVID-19 infection on December 1, he has received antibody infusion in the outpatient setting with no improvement.  He is currently on combination of steroids and Remdesivir.  Poor candidate for Actemra due to advanced age and frail status.  Overall extremely tenuous we continue to monitor  Possible superimposed bacterial infection or pneumonia due to elevated procalcitonin.  Agree with antibiotics for now.  Monitor and assess.    Encouraged the  patient to sit up in chair in the daytime use I-S and flutter valve for pulmonary toiletry and then prone in bed when at night.  Will advance activity and titrate down oxygen as possible.  SpO2: 100 % O2 Flow Rate (L/min): 2 L/min  Recent Labs  Lab 11/14/20 1300 11/14/20 1737 11/19/20 1230 11/19/20 1540 11/19/20 1637 11/19/20 2126 11/20/20 0557 11/21/20 0052  WBC 7.5  --  10.6*  --   --   --  8.3 10.1  HGB 11.6*  --  11.3*  --   --   --  10.7* 10.1*  HCT 36.3*  --  35.9*  --   --   --  32.8* 30.4*  PLT 144*  --  191  --   --   --  202 233  CRP  --   --   --  35.4*  --   --  32.0* 21.7*  BNP  --   --   --   --   --   --  947.5* 760.5*  DDIMER  --   --   --   --   --   --  2.69* 2.16*  PROCALCITON  --   --   --  1.27  --   --  1.12 0.95  AST  --  28 19  --   --   --  29 26  ALT  --  23 20  --   --   --  24 24  ALKPHOS  --  79 118  --   --   --  118 103  BILITOT  --  0.7 0.7  --   --   --  0.8 0.7  ALBUMIN  --  3.8 3.0*  --   --   --  2.6* 2.5*  INR  --   --   --   --   --   --  1.2  --   LATICACIDVEN  --  1.3  --   --  1.8 1.6  --   --   SARSCOV2NAA  --   --   --  POSITIVE*  --   --   --   --     2.  Moderately elevated D-dimer.  Likely due to inflammation for COVID-19, he is on intermediate dose Lovenox, D-dimer is trending down and leg ultrasound is negative for acute DVT.  3.  Advanced age, deconditioning, frail status, moderate protein calorie malnutrition.  Supportive care, PT-OT, protein supplements, at risk for delirium.  Monitor closely.   4.  GERD.  PPI.  5.  BPH.  On Flomax.  6.  AKI on top of CKD stage 3B.  Baseline creatinine around 1.5.  Hydration renal function is improving, renal ultrasound nonacute.      Condition - Extremely Guarded  Family Communication  :  Daughter Vickii Chafe [phone] 11/20/20, message left at 10:01 AM on 11/21/2020.  Code Status : DNR  Consults  :  None  Procedures  :  None  PUD Prophylaxis : PPI  Disposition Plan  :     Status is: Inpt  Dispo: The patient is from: Home              Anticipated d/c is to: Home              Anticipated d/c date is: 3 days              Patient currently is not medically stable to d/c.   DVT Prophylaxis  :  Lovenox   Lab Results  Component Value Date   PLT 233 11/21/2020    Diet :  Diet Order            Diet Heart Room service appropriate? Yes; Fluid consistency: Thin  Diet effective now                  Inpatient Medications  Scheduled Meds: . vitamin C  500 mg Oral Daily  . aspirin EC  81  mg Oral Daily  . Chlorhexidine Gluconate Cloth  6 each Topical Q0600  . dexamethasone (DECADRON) injection  6 mg Intravenous Q24H  . enoxaparin (LOVENOX) injection  30 mg Subcutaneous Q24H  . feeding supplement  237 mL Oral BID BM  . lactose free nutrition  237 mL Oral BID BM  . mirabegron ER  50 mg Oral Daily  . mupirocin ointment  1 application Nasal BID  . pantoprazole  40 mg Oral BID AC  . tamsulosin  0.4 mg Oral BID  . zinc sulfate  220 mg Oral Daily   Continuous Infusions: . azithromycin 500 mg (11/20/20 2028)  . cefTRIAXone (ROCEPHIN)  IV Stopped (11/21/20 0856)  . remdesivir 100 mg in NS 100 mL Stopped (11/20/20 1022)   PRN Meds:.acetaminophen, albuterol, docusate sodium, guaiFENesin-dextromethorphan, HYDROcodone-acetaminophen, [DISCONTINUED] ondansetron **OR** ondansetron (ZOFRAN) IV, polyethylene glycol  Antibiotics  :    Anti-infectives (From admission, onward)   Start     Dose/Rate Route Frequency Ordered Stop   11/20/20 1000  remdesivir 100 mg in sodium chloride 0.9 % 100 mL IVPB       "Followed by" Linked Group Details   100 mg 200 mL/hr over 30 Minutes Intravenous Daily 11/19/20 1928 11/24/20 0959   11/19/20 2030  remdesivir 200 mg in sodium chloride 0.9% 250 mL IVPB       "Followed by" Linked Group Details   200 mg 580 mL/hr over 30 Minutes Intravenous Once 11/19/20 1928 11/20/20 0218   11/19/20 2000  cefTRIAXone (ROCEPHIN) 2 g in sodium  chloride 0.9 % 100 mL IVPB        2 g 200 mL/hr over 30 Minutes Intravenous Every 24 hours 11/19/20 1917 11/24/20 1959   11/19/20 2000  azithromycin (ZITHROMAX) 500 mg in sodium chloride 0.9 % 250 mL IVPB        500 mg 250 mL/hr over 60 Minutes Intravenous Every 24 hours 11/19/20 1917 11/24/20 1959       Time Spent in minutes  30   Lala Lund M.D on 11/21/2020 at 9:59 AM  To page go to [email or web address]   [facility] -  Office  [phone]  See all Orders from today for further details    Objective:   Vitals:   11/20/20 1609 11/20/20 2051 11/21/20 0500 11/21/20 0609  BP: (!) 143/80 135/88  134/78  Pulse: 89 95  97  Resp: 20 19 20 18   Temp: (!) 97.4 F (36.3 C) (!) 97.3 F (36.3 C)  (!) 97.5 F (36.4 C)  TempSrc: Oral Oral  Oral  SpO2: 98% 100%  100%    Wt Readings from Last 3 Encounters:  11/13/20 59 kg  10/16/20 59.1 kg  09/04/20 62.4 kg     Intake/Output Summary (Last 24 hours) at 11/21/2020 0959 Last data filed at 11/21/2020 0335 Gross per 24 hour  Intake 100 ml  Output 300 ml  Net -200 ml     Physical Exam  Awake but mildly confused, moving all 4 extremities, [place].AT,PERRAL Supple Neck,No JVD, No cervical lymphadenopathy appriciated.  Symmetrical Chest wall movement, Good air movement bilaterally, CTAB RRR,No Gallops, Rubs or new Murmurs, No Parasternal Heave +ve B.Sounds, Abd Soft, No tenderness, No organomegaly appriciated, No rebound - guarding or rigidity. No Cyanosis, Clubbing or edema, No new Rash or bruise     Data Review:    CBC Recent Labs  Lab 11/14/20 1300 11/19/20 1230 11/20/20 0557 11/21/20 0052  WBC 7.5 10.6* 8.3 10.1  HGB 11.6* 11.3* 10.7* 10.1*  HCT 36.3* 35.9* 32.8* 30.4*  PLT 144* 191 202 233  MCV 98.6 97.6 95.6 93.3  MCH 31.5 30.7 31.2 31.0  MCHC 32.0 31.5 32.6 33.2  RDW 13.4 13.6 13.9 13.9  LYMPHSABS  --   --  0.2* 0.3*  MONOABS  --   --  0.4 0.5  EOSABS  --   --  0.0 0.0  BASOSABS  --   --  0.0 0.0     Recent Labs  Lab 11/14/20 1300 11/14/20 1737 11/19/20 1230 11/19/20 1540 11/19/20 1637 11/19/20 2126 11/20/20 0557 11/21/20 0052  NA 135  --  134*  --   --   --  139 135  K 5.2*  --  4.4  --   --   --  4.6 5.0  CL 105  --  104  --   --   --  109 107  CO2 18*  --  16*  --   --   --  17* 14*  GLUCOSE 111*  --  124*  --   --   --  149* 147*  BUN 48*  --  55*  --   --   --  57* 67*  CREATININE 2.22*  --  2.62*  --   --   --  2.36* 2.04*  CALCIUM 9.2  --  9.3  --   --   --  9.2 9.1  AST  --  28 19  --   --   --  29 26  ALT  --  23 20  --   --   --  24 24  ALKPHOS  --  79 118  --   --   --  118 103  BILITOT  --  0.7 0.7  --   --   --  0.8 0.7  ALBUMIN  --  3.8 3.0*  --   --   --  2.6* 2.5*  MG  --   --   --   --   --   --  1.7 1.9  CRP  --   --   --  35.4*  --   --  32.0* 21.7*  DDIMER  --   --   --   --   --   --  2.69* 2.16*  PROCALCITON  --   --   --  1.27  --   --  1.12 0.95  LATICACIDVEN  --  1.3  --   --  1.8 1.6  --   --   INR  --   --   --   --   --   --  1.2  --   BNP  --   --   --   --   --   --  947.5* 760.5*    ------------------------------------------------------------------------------------------------------------------ Recent Labs    11/19/20 1637  TRIG 89    Lab Results  Component Value Date   HGBA1C (H) 02/08/2011    5.7 (NOTE)                                                                       According to the ADA Clinical Practice Recommendations for 2011, when HbA1c is used as  a screening test:   >=6.5%   Diagnostic of Diabetes Mellitus           (if abnormal result  is confirmed)  5.7-6.4%   Increased risk of developing Diabetes Mellitus  References:Diagnosis and Classification of Diabetes Mellitus,Diabetes Care,2011,34(Suppl 1):S62-S69 and Standards of Medical Care in         Diabetes - 2011,Diabetes AVWU,9811,91  (Suppl 1):S11-S61.    ------------------------------------------------------------------------------------------------------------------ No results for input(s): TSH, T4TOTAL, T3FREE, THYROIDAB in the last 72 hours.  Invalid input(s): FREET3  Cardiac Enzymes No results for input(s): CKMB, TROPONINI, MYOGLOBIN in the last 168 hours.  Invalid input(s): CK ------------------------------------------------------------------------------------------------------------------    Component Value Date/Time   BNP 760.5 (H) 11/21/2020 0052    Micro Results Recent Results (from the past 240 hour(s))  Microscopic Examination     Status: Abnormal   Collection Time: 11/13/20 11:36 AM   Urine  Result Value Ref Range Status   WBC, UA 0-5 0 - 5 /hpf Final   RBC 3-10 (A) 0 - 2 /hpf Final   Epithelial Cells (non renal) 0-10 0 - 10 /hpf Final   Renal Epithel, UA None seen None seen /hpf Final   Mucus, UA Present Not Estab. Final   Bacteria, UA Few None seen/Few Final  Urine culture     Status: Abnormal   Collection Time: 11/14/20  4:57 PM   Specimen: Urine, Random  Result Value Ref Range Status   Specimen Description URINE, RANDOM  Final   Special Requests   Final    NONE Performed at [facility], [address], [place], [place] [address]    Culture MULTIPLE SPECIES PRESENT, SUGGEST RECOLLECTION (A)  Final   Report Status 11/16/2020 FINAL  Final  Resp Panel by RT-PCR (Flu A&B, Covid) Nasopharyngeal Swab     Status: Abnormal   Collection Time: 11/19/20  3:40 PM   Specimen: Nasopharyngeal Swab; Nasopharyngeal(NP) swabs in vial transport medium  Result Value Ref Range Status   SARS Coronavirus 2 by RT PCR POSITIVE (A) NEGATIVE Final    Comment: RESULT CALLED TO, READ BACK BY AND VERIFIED WITH: Corlis Leak RN 11/19/20 AT 1805 SK  (NOTE) SARS-CoV-2 target nucleic acids are DETECTED.  The SARS-CoV-2 RNA is generally detectable in upper respiratory specimens during the acute phase of infection. Positive  results are indicative of the presence of the identified virus, but do not rule out bacterial infection or co-infection with other pathogens not detected by the test. Clinical correlation with patient history and other diagnostic information is necessary to determine patient infection status. The expected result is Negative.  Fact Sheet for Patients: EntrepreneurPulse.com.au  Fact Sheet for Healthcare Providers: IncredibleEmployment.be  This test is not yet approved or cleared by the [place] FDA and  has been authorized for detection and/or diagnosis of SARS-CoV-2 by FDA under an Emergency Use Authorization (EUA).  This EUA will remain in effect (meaning this test can be  used) for the duration of  the COVID-19 declaration under Section 564(b)(1) of the Act, 21 U.S.C. section 360bbb-3(b)(1), unless the authorization is terminated or revoked sooner.     Influenza A by PCR NEGATIVE NEGATIVE Final   Influenza B by PCR NEGATIVE NEGATIVE Final    Comment: (NOTE) The Xpert Xpress SARS-CoV-2/FLU/RSV plus assay is intended as an aid in the diagnosis of influenza from Nasopharyngeal swab specimens and should not be used as a sole basis for treatment. Nasal washings and aspirates are unacceptable for Xpert Xpress SARS-CoV-2/FLU/RSV testing.  Fact Sheet for Patients: EntrepreneurPulse.com.au  Fact Sheet for Healthcare Providers: IncredibleEmployment.be  This test is not yet approved or cleared by the [place] FDA and has been authorized for detection and/or diagnosis of SARS-CoV-2 by FDA under an Emergency Use Authorization (EUA). This EUA will remain in effect (meaning this test can be used) for the duration of the COVID-19 declaration under Section 564(b)(1) of the Act, 21 U.S.C. section 360bbb-3(b)(1), unless the authorization is terminated or revoked.  Performed at [facility], [place]  [address] [place], [place] [address]   Blood Culture (routine x 2)     Status: None (Preliminary result)   Collection Time: 11/19/20  4:37 PM   Specimen: BLOOD  Result Value Ref Range Status   Specimen Description BLOOD SITE NOT SPECIFIED  Final   Special Requests   Final    BOTTLES DRAWN AEROBIC AND ANAEROBIC Blood Culture adequate volume   Culture   Final    NO GROWTH 2 DAYS Performed at [place] [address] [place], [place] [address]    Report Status PENDING  Incomplete  Culture, sputum-assessment     Status: None   Collection Time: 11/19/20  7:17 PM   Specimen: Expectorated Sputum  Result Value Ref Range Status   Specimen Description EXPECTORATED SPUTUM  Final   Special Requests NONE  Final   Sputum evaluation   Final    THIS SPECIMEN IS ACCEPTABLE FOR SPUTUM CULTURE Performed at [facility], [place] [address] [place], [place] [address]    Report Status 11/20/2020 FINAL  Final  Culture, respiratory     Status: None (Preliminary result)   Collection Time: 11/19/20  7:17 PM  Result Value Ref Range Status   Specimen Description EXPECTORATED SPUTUM  Final   Special Requests NONE Reflexed from [id number]  Final   Gram Stain   Final    MODERATE WBC PRESENT, PREDOMINANTLY PMN ABUNDANT GRAM POSITIVE COCCI IN PAIRS IN CLUSTERS    Culture   Final    MODERATE STAPHYLOCOCCUS AUREUS SUSCEPTIBILITIES TO FOLLOW Performed at [facility], [place] [address] [place], [place] [address]    Report Status PENDING  Incomplete  Blood Culture (routine x 2)     Status: None (Preliminary result)   Collection Time: 11/19/20  9:10 PM   Specimen: BLOOD LEFT WRIST  Result Value Ref Range Status   Specimen Description BLOOD LEFT WRIST  Final   Special Requests   Final    BOTTLES DRAWN AEROBIC AND ANAEROBIC Blood Culture adequate volume   Culture   Final    NO GROWTH 2 DAYS Performed at [facility], [place] [address], [place],  [address]    Report Status PENDING   Incomplete  MRSA PCR Screening     Status: Abnormal   Collection Time: 11/20/20  6:58 AM   Specimen: Nasopharyngeal  Result Value Ref Range Status   MRSA by PCR POSITIVE (A) NEGATIVE Final    Comment:        The GeneXpert MRSA Assay (FDA approved for NASAL specimens only), is one component of a comprehensive MRSA colonization surveillance program. It is not intended to diagnose MRSA infection nor to guide or monitor treatment for MRSA infections. RESULT CALLED TO, READ BACK BY AND VERIFIED WITH: Lieutenant Diego RN 12:25 11/20/20 ([email or web address]) Performed at [facility], [place] [address], [place],  [address]     Radiology Reports DG Chest 1 View  Result Date: 11/19/2020  CLINICAL DATA:  COVID-19 EXAM: CHEST  1 VIEW COMPARISON:  11/14/2020 and prior. FINDINGS: Patchy right predominant bilateral pulmonary opacities. No pneumothorax or pleural effusion. Stable cardiomediastinal silhouette. No acute osseous abnormality. IMPRESSION: Multifocal pneumonia. Electronically Signed   By: Primitivo Gauze M.D.   On: 11/19/2020 13:25   DG Chest 2 View  Result Date: 10/30/2020 CLINICAL DATA:  Follow-up pneumonia. Shortness of breath. EXAM: CHEST - 2 VIEW COMPARISON:  Radiograph 10/01/2020. Chest CT 09/04/2020. Lung bases from abdominal CT 10/09/2020 FINDINGS: Improved lung volumes from prior exam. Previous right upper lobe opacities are not definitively seen on the current exam. The heart is normal in size. Stable aortic tortuosity and atherosclerosis. Increased AP diameter of the thorax. Possible small pleural effusions. Coarse lung markings with improvement in vascular congestion. No convincing pulmonary edema or [place] B-lines. No pneumothorax. Bones are under mineralized. Chronic compression fractures in the mid and lower thoracic spine. IMPRESSION: 1. Improved lung volumes. Previous right upper lobe opacities are not definitively seen on the current exam. 2. Possible small pleural effusions. 3.  Improved vascular congestion. Electronically Signed   By: Keith Rake M.D.   On: 10/30/2020 10:28   US RENAL  Result Date: 11/20/2020 CLINICAL DATA:  Acute kidney injury EXAM: RENAL / URINARY TRACT ULTRASOUND COMPLETE COMPARISON:  CT 10/09/2020 FINDINGS: Right Kidney: Renal measurements: 9.4 by 4.5 x 4.6 cm = volume: 101.8 mL. Echogenicity within normal limits. No hydronephrosis. Multiple cysts, fewer than 10. The largest cyst in the upper pole measures 7.9 x 4.5 x 6.5 cm. Lower pole cyst measures 4.3 x 3.2 x 4 cm. Left Kidney: Renal measurements: 9.8 x 4.7 x 4.4 cm = volume: 146.8 mL. Echogenicity within normal limits. No hydronephrosis. Cortical thinning at the poles. Multiple cysts, fewer than 10. The largest is seen in the lower pole and measures 3.9 x 3.3 x 2.9 cm. Bladder: Appears normal for degree of bladder distention. Other: None. IMPRESSION: 1. Negative for hydronephrosis. 2. Mild cortical thinning at the poles of the left kidney suggesting mild atrophy. 3. Multiple simple cysts within the kidneys. Electronically Signed   By: Donavan Foil M.D.   On: 11/20/2020 20:56   DG Chest Port 1 View  Result Date: 11/21/2020 CLINICAL DATA:  Shortness of breath, COVID-19 positive history hypertension, prostate cancer, colon cancer EXAM: PORTABLE CHEST 1 VIEW COMPARISON:  Portable exam 0755 hours compared to 11/19/2020 FINDINGS: Enlargement of cardiac silhouette. Tortuous aorta. Mediastinal contours normal. BILATERAL patchy pulmonary infiltrates RIGHT greater than LEFT consistent with multifocal pneumonia and COVID-19. No pleural effusion or pneumothorax. Bones demineralized. IMPRESSION: BILATERAL pulmonary infiltrates consistent with multifocal pneumonia and COVID-19, not significantly changed. Electronically Signed   By: Lavonia Dana M.D.   On: 11/21/2020 08:53   DG Chest Portable 1 View  Result Date: 11/14/2020 CLINICAL DATA:  COVID-19 positive.  Weakness. EXAM: PORTABLE CHEST 1 VIEW COMPARISON:   10/29/2020 FINDINGS: The heart size and mediastinal contours are within normal limits. Both lungs are clear. The visualized skeletal structures are unremarkable. IMPRESSION: No active disease. Electronically Signed   By: Franchot Gallo M.D.   On: 11/14/2020 13:44   VAS [place] LOWER EXTREMITY VENOUS (DVT)  Result Date: 11/20/2020  Lower Venous DVT Study Indications: Swelling. Other Indications: Elevated D-Dimer. Comparison Study: No previous exam Performing Technologist: Vonzell Schlatter RVT  Examination Guidelines: A complete evaluation includes B-mode imaging, spectral Doppler, color Doppler, and power Doppler as needed of all accessible portions of each vessel. Bilateral testing is considered an integral part of  a complete examination. Limited examinations for reoccurring indications may be performed as noted. The reflux portion of the exam is performed with the patient in reverse Trendelenburg.  +---------+---------------+---------+-----------+----------+--------------+ RIGHT    CompressibilityPhasicitySpontaneityPropertiesThrombus Aging +---------+---------------+---------+-----------+----------+--------------+ CFV      Full           Yes      Yes                                 +---------+---------------+---------+-----------+----------+--------------+ SFJ      Full                                                        +---------+---------------+---------+-----------+----------+--------------+ FV Prox  Full                                                        +---------+---------------+---------+-----------+----------+--------------+ FV Mid   Full                                                        +---------+---------------+---------+-----------+----------+--------------+ FV DistalFull                                                        +---------+---------------+---------+-----------+----------+--------------+ PFV      Full                                                         +---------+---------------+---------+-----------+----------+--------------+ POP      Full           Yes      Yes                                 +---------+---------------+---------+-----------+----------+--------------+ PTV      Full                                                        +---------+---------------+---------+-----------+----------+--------------+ PERO     Full                                                        +---------+---------------+---------+-----------+----------+--------------+   +---------+---------------+---------+-----------+----------+--------------+ LEFT     CompressibilityPhasicitySpontaneityPropertiesThrombus Aging +---------+---------------+---------+-----------+----------+--------------+ CFV      Full  Yes      Yes                                 +---------+---------------+---------+-----------+----------+--------------+ SFJ      Full                                                        +---------+---------------+---------+-----------+----------+--------------+ FV Prox  Full                                                        +---------+---------------+---------+-----------+----------+--------------+ FV Mid   Full                                                        +---------+---------------+---------+-----------+----------+--------------+ FV DistalFull                                                        +---------+---------------+---------+-----------+----------+--------------+ PFV      Full                                                        +---------+---------------+---------+-----------+----------+--------------+ POP      Full           Yes      Yes                                 +---------+---------------+---------+-----------+----------+--------------+ PTV      Full                                                         +---------+---------------+---------+-----------+----------+--------------+ PERO     Full                                                        +---------+---------------+---------+-----------+----------+--------------+     Summary: RIGHT: - There is no evidence of deep vein thrombosis in the lower extremity.  - No cystic structure found in the popliteal fossa.  LEFT: - There is no evidence of deep vein thrombosis in the lower extremity.  - No cystic structure found in the popliteal fossa.  *See table(s) above for measurements and observations. Electronically signed by Monica Martinez MD on 11/20/2020 at 5:01:37  PM.    Final

## 2020-11-22 LAB — COMPREHENSIVE METABOLIC PANEL
ALT: 24 U/L (ref 0–44)
AST: 20 U/L (ref 15–41)
Albumin: 2.5 g/dL — ABNORMAL LOW (ref 3.5–5.0)
Alkaline Phosphatase: 88 U/L (ref 38–126)
Anion gap: 15 (ref 5–15)
BUN: 74 mg/dL — ABNORMAL HIGH (ref 8–23)
CO2: 12 mmol/L — ABNORMAL LOW (ref 22–32)
Calcium: 8.9 mg/dL (ref 8.9–10.3)
Chloride: 105 mmol/L (ref 98–111)
Creatinine, Ser: 1.78 mg/dL — ABNORMAL HIGH (ref 0.61–1.24)
GFR, Estimated: 35 mL/min — ABNORMAL LOW (ref >60)
Glucose, Bld: 135 mg/dL — ABNORMAL HIGH (ref 70–99)
Potassium: 5 mmol/L (ref 3.5–5.1)
Sodium: 132 mmol/L — ABNORMAL LOW (ref 135–145)
Total Bilirubin: 0.7 mg/dL (ref 0.3–1.2)
Total Protein: 5.4 g/dL — ABNORMAL LOW (ref 6.5–8.1)

## 2020-11-22 LAB — CBC WITH DIFFERENTIAL/PLATELET
Abs Immature Granulocytes: 0.17 10*3/uL — ABNORMAL HIGH (ref 0.00–0.07)
Basophils Absolute: 0 10*3/uL (ref 0.0–0.1)
Basophils Relative: 0 %
Eosinophils Absolute: 0 10*3/uL (ref 0.0–0.5)
Eosinophils Relative: 0 %
HCT: 30 % — ABNORMAL LOW (ref 39.0–52.0)
Hemoglobin: 10.4 g/dL — ABNORMAL LOW (ref 13.0–17.0)
Immature Granulocytes: 2 %
Lymphocytes Relative: 3 %
Lymphs Abs: 0.2 10*3/uL — ABNORMAL LOW (ref 0.7–4.0)
MCH: 31.4 pg (ref 26.0–34.0)
MCHC: 34.7 g/dL (ref 30.0–36.0)
MCV: 90.6 fL (ref 80.0–100.0)
Monocytes Absolute: 0.2 10*3/uL (ref 0.1–1.0)
Monocytes Relative: 3 %
Neutro Abs: 8.6 10*3/uL — ABNORMAL HIGH (ref 1.7–7.7)
Neutrophils Relative %: 92 %
Platelets: 223 10*3/uL (ref 150–400)
RBC: 3.31 MIL/uL — ABNORMAL LOW (ref 4.22–5.81)
RDW: 14.1 % (ref 11.5–15.5)
WBC: 9.3 10*3/uL (ref 4.0–10.5)
nRBC: 0 % (ref 0.0–0.2)

## 2020-11-22 LAB — CULTURE, RESPIRATORY W GRAM STAIN

## 2020-11-22 LAB — MAGNESIUM: Magnesium: 2 mg/dL (ref 1.7–2.4)

## 2020-11-22 LAB — PROCALCITONIN: Procalcitonin: 0.46 ng/mL

## 2020-11-22 LAB — D-DIMER, QUANTITATIVE: D-Dimer, Quant: 1.39 ug/mL-FEU — ABNORMAL HIGH (ref 0.00–0.50)

## 2020-11-22 LAB — BRAIN NATRIURETIC PEPTIDE: B Natriuretic Peptide: 755.7 pg/mL — ABNORMAL HIGH (ref 0.0–100.0)

## 2020-11-22 LAB — C-REACTIVE PROTEIN: CRP: 11.5 mg/dL — ABNORMAL HIGH (ref <1.0)

## 2020-11-22 MED ORDER — VANCOMYCIN HCL 500 MG/100ML IV SOLN
500.0000 mg | INTRAVENOUS | Status: DC
  Filled 2020-11-22: qty 100

## 2020-11-22 MED ORDER — VANCOMYCIN HCL 1250 MG/250ML IV SOLN
1250.0000 mg | Freq: Once | INTRAVENOUS | Status: AC
  Administered 2020-11-22: 15:00:00 1250 mg via INTRAVENOUS
  Filled 2020-11-22: qty 250

## 2020-11-22 MED ORDER — SODIUM ZIRCONIUM CYCLOSILICATE 10 G PO PACK
10.0000 g | PACK | Freq: Two times a day (BID) | ORAL | Status: AC
  Administered 2020-11-22 (×2): 10 g via ORAL
  Filled 2020-11-22 (×2): qty 1

## 2020-11-22 MED ORDER — DEXTROSE 5 % IV SOLN: INTRAVENOUS | Status: AC

## 2020-11-22 NOTE — Progress Notes (Addendum)
Physical Therapy Treatment Patient Details Name: Tony Holt MRN: [id number] DOB: 12/10/1926 Today's Date: 11/22/2020    History of Present Illness 86 year old male with past medical history of CKD IIIa, HTN, prostate cancer, CAF, anemia, cecal adenocarcinoma (Dx 01/2020), and inferior pubic ramus fx 2 months ago who presented 11/19/20 to ED with complaints of generalized weakness shortness of breath and cough. +encephalopathy due to COVID-19 infection (diagnosed 11/12/20)    PT Comments    Patient nauseated earlier today. On return, denied nausea and willing to ambulate. Patient requires incr time due to slow mobility and very HOH with instructions needing to be repeated multiple times. He walked to bathroom with RW, able to get down/up from standard height toilet with grab bar and min assist. Patient on room air throughout session with sats>90%.    Follow Up Recommendations  Home health PT;Supervision/Assistance - 24 hour (if daughter can't provide 24/7 then will need SNF)     Equipment Recommendations  None recommended by PT    Recommendations for Other Services       Precautions / Restrictions Precautions Precautions: Fall Precaution Comments: hx of pubic rami fx 2 mos ago    Mobility  Bed Mobility Overal bed mobility: Needs Assistance Bed Mobility: Supine to Sit     Supine to sit: Min guard;HOB elevated     General bed mobility comments: no physical assist; encouragement to move  Transfers Overall transfer level: Needs assistance Equipment used: Rolling walker (2 wheeled) Transfers: Sit to/from Stand Sit to Stand: Min assist         General transfer comment: min A assist to power up and steady with use of RW; min A with grab bar off std toilet  Ambulation/Gait Ambulation/Gait assistance: Min assist Gait Distance (Feet): 15 Feet (toileted; 25 ft) Assistive device: Rolling walker (2 wheeled) Gait Pattern/deviations: Step-to pattern;Decreased step length -  right;Decreased step length - left;Trunk flexed     General Gait Details: assist to maneuver RW around obstacles   Stairs             Wheelchair Mobility    Modified Rankin (Stroke Patients Only)       Balance Overall balance assessment: Needs assistance Sitting-balance support: Feet supported Sitting balance-Leahy Scale: Good     Standing balance support: Bilateral upper extremity supported;During functional activity Standing balance-Leahy Scale: Poor Standing balance comment: reliant on external support and staff assist                            Cognition Arousal/Alertness: Awake/alert Behavior During Therapy: WFL for tasks assessed/performed Overall Cognitive Status: Difficult to assess                                 General Comments: Pt HOH making fulll cognitive assessment difficult. Pt was able to follow simple commands and problem solve appropriately for session      Exercises      General Comments        Pertinent Vitals/Pain Pain Assessment: Faces Faces Pain Scale: No hurt    Home Living                      Prior Function            PT Goals (current goals can now be found in the care plan section) Acute Rehab PT Goals Patient Stated Goal: beat  this virus PT Goal Formulation: With patient Time For Goal Achievement: 12/04/20 Potential to Achieve Goals: Good Progress towards PT goals: Progressing toward goals    Frequency    Min 3X/week      PT Plan Current plan remains appropriate    Co-evaluation              AM-PAC PT "6 Clicks" Mobility   Outcome Measure  Help needed turning from your back to your side while in a flat bed without using bedrails?: A Little Help needed moving from lying on your back to sitting on the side of a flat bed without using bedrails?: A Little Help needed moving to and from a bed to a chair (including a wheelchair)?: A Little Help needed standing up from a  chair using your arms (e.g., wheelchair or bedside chair)?: A Little Help needed to walk in hospital room?: A Little Help needed climbing 3-5 steps with a railing? : A Lot 6 Click Score: 17    End of Session Equipment Utilized During Treatment: Gait belt Activity Tolerance: Patient limited by fatigue Patient left: in chair;with call bell/phone within reach;with chair alarm set Nurse Communication: Mobility status;Other (comment) (primofit leaked) PT Visit Diagnosis: Muscle weakness (generalized) (M62.81);History of falling (Z91.81);Difficulty in walking, not elsewhere classified (R26.2)     Time: 7471-8550 PT Time Calculation (min) (ACUTE ONLY): 29 min  Charges:  $Gait Training: 23-37 mins                      Tony Holt, PT Pager [phone]    Tony Holt 11/22/2020, 3:02 PM

## 2020-11-22 NOTE — Progress Notes (Signed)
PROGRESS NOTE                                                                                                                                                                                                             Patient Demographics:    Tony Holt, is a 86 y.o. male, DOB - Aug 31, 1926, UJW:[id number]  Outpatient Primary MD for the patient is [name], MD    LOS - 2  Admit date - 11/19/2020    No chief complaint on file.      Brief Narrative (HPI from H&P) 86 year old male with past medical history of chronic kidney disease stage IIIa, gastroesophageal reflux disease, hypertension, benign prostatic hyperplasia, prostate cancer, chronic atrial fibrillation, iron deficiency anemia, cecal adenocarcinoma (Dx 01/2020) who presents to [facility] emergency department with complaints of generalized weakness shortness of breath and cough, he is unvaccinated for Covid and diagnosed with COVID-19 pneumonia with acute hypoxic respiratory failure admitted to the hospital.   Subjective:   Patient in bed, appears comfortable, denies any headache, no fever, no chest pain or pressure, no shortness of breath , no abdominal pain,  but unreliable historian.   Assessment  & Plan :     1. Acute Hypoxic Resp. Failure due to Acute Covid 19 Viral Pneumonitis during the ongoing 2020 Covid 19 Pandemic - he is unfortunately not vaccinated, he was diagnosed with COVID-19 infection on December 1, he has received antibody infusion in the outpatient setting with no improvement.  He is currently on combination of steroids and Remdesivir.  Poor candidate for Actemra due to advanced age and frail status.  Overall extremely tenuous we continue to monitor  Possible superimposed bacterial infection or pneumonia due to elevated procalcitonin.  Agree with antibiotics for now.  Monitor and assess.  Encouraged the patient to sit up in chair in  the daytime use I-S and flutter valve for pulmonary toiletry and then prone in bed when at night.  Will advance activity and titrate down oxygen as possible.  SpO2: 98 % O2 Flow Rate (L/min): 2 L/min  Recent Labs  Lab 11/19/20 1230 11/19/20 1540 11/19/20 1637 11/19/20 2126 11/20/20 0557 11/21/20 0052 11/22/20 0236  WBC 10.6*  --   --   --  8.3 10.1 9.3  HGB 11.3*  --   --   --  10.7*  10.1* 10.4*  HCT 35.9*  --   --   --  32.8* 30.4* 30.0*  PLT 191  --   --   --  202 233 223  CRP  --  35.4*  --   --  32.0* 21.7* 11.5*  BNP  --   --   --   --  947.5* 760.5* 755.7*  DDIMER  --   --   --   --  2.69* 2.16* 1.39*  PROCALCITON  --  1.27  --   --  1.12 0.95 0.46  AST 19  --   --   --  29 26 20   ALT 20  --   --   --  24 24 24   ALKPHOS 118  --   --   --  118 103 88  BILITOT 0.7  --   --   --  0.8 0.7 0.7  ALBUMIN 3.0*  --   --   --  2.6* 2.5* 2.5*  INR  --   --   --   --  1.2  --   --   LATICACIDVEN  --   --  1.8 1.6  --   --   --   SARSCOV2NAA  --  POSITIVE*  --   --   --   --   --     2.  Moderately elevated D-dimer.  Likely due to inflammation for COVID-19, he is on intermediate dose Lovenox, D-dimer is trending down and leg ultrasound is negative for acute DVT.  3.  Advanced age, deconditioning, frail status, moderate protein calorie malnutrition.  Supportive care, PT-OT, protein supplements, at risk for delirium.  Monitor closely.   4.  GERD.  PPI.  5.  BPH.  On Flomax.  6.  AKI on top of CKD stage 3B.  Baseline creatinine around 1.5.  Hydration renal function is improving, renal ultrasound nonacute.    Condition - Extremely Guarded  Family Communication  :  Daughter Vickii Chafe [phone] 11/20/20, message left at 10:01 AM on 11/21/2020.  Code Status : DNR  Consults  :  None  Procedures  :  None  PUD Prophylaxis : PPI  Disposition Plan  :    Status is: Inpt  Dispo: The patient is from: Home              Anticipated d/c is to: Home              Anticipated d/c date  is: 3 days              Patient currently is not medically stable to d/c.   DVT Prophylaxis  :  Lovenox   Lab Results  Component Value Date   PLT 223 11/22/2020    Diet :  Diet Order            Diet Heart Room service appropriate? Yes; Fluid consistency: Thin  Diet effective now                  Inpatient Medications  Scheduled Meds: . vitamin C  500 mg Oral Daily  . aspirin EC  81 mg Oral Daily  . Chlorhexidine Gluconate Cloth  6 each Topical Q0600  . dexamethasone (DECADRON) injection  6 mg Intravenous Q24H  . enoxaparin (LOVENOX) injection  30 mg Subcutaneous Q24H  . feeding supplement  237 mL Oral BID BM  . lactose free nutrition  237 mL Oral BID BM  . mirabegron ER  50  mg Oral Daily  . mupirocin ointment  1 application Nasal BID  . pantoprazole  40 mg Oral BID AC  . sodium zirconium cyclosilicate  10 g Oral BID  . tamsulosin  0.4 mg Oral BID  . zinc sulfate  220 mg Oral Daily   Continuous Infusions: . azithromycin 500 mg (11/21/20 2049)  . cefTRIAXone (ROCEPHIN)  IV 2 g (11/21/20 2000)  . dextrose 100 mL/hr at 11/22/20 0850  . remdesivir 100 mg in NS 100 mL 100 mg (11/22/20 0851)   PRN Meds:.acetaminophen, albuterol, docusate sodium, guaiFENesin-dextromethorphan, [DISCONTINUED] ondansetron **OR** ondansetron (ZOFRAN) IV, polyethylene glycol  Antibiotics  :    Anti-infectives (From admission, onward)   Start     Dose/Rate Route Frequency Ordered Stop   11/20/20 1000  remdesivir 100 mg in sodium chloride 0.9 % 100 mL IVPB       "Followed by" Linked Group Details   100 mg 200 mL/hr over 30 Minutes Intravenous Daily 11/19/20 1928 11/24/20 0959   11/19/20 2030  remdesivir 200 mg in sodium chloride 0.9% 250 mL IVPB       "Followed by" Linked Group Details   200 mg 580 mL/hr over 30 Minutes Intravenous Once 11/19/20 1928 11/20/20 0218   11/19/20 2000  cefTRIAXone (ROCEPHIN) 2 g in sodium chloride 0.9 % 100 mL IVPB        2 g 200 mL/hr over 30 Minutes  Intravenous Every 24 hours 11/19/20 1917 11/24/20 1959   11/19/20 2000  azithromycin (ZITHROMAX) 500 mg in sodium chloride 0.9 % 250 mL IVPB        500 mg 250 mL/hr over 60 Minutes Intravenous Every 24 hours 11/19/20 1917 11/24/20 1959       Time Spent in minutes  30   Lala Lund M.D on 11/22/2020 at 10:29 AM  To page go to [email or web address]   [facility] -  Office  [phone]  See all Orders from today for further details    Objective:   Vitals:   11/21/20 1200 11/21/20 1634 11/21/20 2146 11/22/20 0523  BP: 102/74 (!) 143/88 132/79 (!) 149/88  Pulse: 92 96 96 93  Resp: (!) 25 20 (!) 22 18  Temp:  97.7 F (36.5 C) (!) 97.1 F (36.2 C) (!) 97.5 F (36.4 C)  TempSrc:  Oral Axillary Axillary  SpO2: 100% 99% 99% 98%    Wt Readings from Last 3 Encounters:  11/13/20 59 kg  10/16/20 59.1 kg  09/04/20 62.4 kg     Intake/Output Summary (Last 24 hours) at 11/22/2020 1029 Last data filed at 11/22/2020 0508 Gross per 24 hour  Intake 470 ml  Output 3500 ml  Net -3030 ml     Physical Exam  Awake but confused, moving all 4 extremities, [place].AT,PERRAL Supple Neck,No JVD, No cervical lymphadenopathy appriciated.  Symmetrical Chest wall movement, Good air movement bilaterally, CTAB RRR,No Gallops, Rubs or new Murmurs, No Parasternal Heave +ve B.Sounds, Abd Soft, No tenderness, No organomegaly appriciated, No rebound - guarding or rigidity. No Cyanosis, Clubbing or edema, No new Rash or bruise    Data Review:    CBC Recent Labs  Lab 11/19/20 1230 11/20/20 0557 11/21/20 0052 11/22/20 0236  WBC 10.6* 8.3 10.1 9.3  HGB 11.3* 10.7* 10.1* 10.4*  HCT 35.9* 32.8* 30.4* 30.0*  PLT 191 202 233 223  MCV 97.6 95.6 93.3 90.6  MCH 30.7 31.2 31.0 31.4  MCHC 31.5 32.6 33.2 34.7  RDW 13.6 13.9 13.9 14.1  LYMPHSABS  --  0.2*  0.3* 0.2*  MONOABS  --  0.4 0.5 0.2  EOSABS  --  0.0 0.0 0.0  BASOSABS  --  0.0 0.0 0.0    Recent Labs  Lab 11/19/20 1230 11/19/20 1540  11/19/20 1637 11/19/20 2126 11/20/20 0557 11/21/20 0052 11/22/20 0236  NA 134*  --   --   --  139 135 132*  K 4.4  --   --   --  4.6 5.0 5.0  CL 104  --   --   --  109 107 105  CO2 16*  --   --   --  17* 14* 12*  GLUCOSE 124*  --   --   --  149* 147* 135*  BUN 55*  --   --   --  57* 67* 74*  CREATININE 2.62*  --   --   --  2.36* 2.04* 1.78*  CALCIUM 9.3  --   --   --  9.2 9.1 8.9  AST 19  --   --   --  29 26 20   ALT 20  --   --   --  24 24 24   ALKPHOS 118  --   --   --  118 103 88  BILITOT 0.7  --   --   --  0.8 0.7 0.7  ALBUMIN 3.0*  --   --   --  2.6* 2.5* 2.5*  MG  --   --   --   --  1.7 1.9 2.0  CRP  --  35.4*  --   --  32.0* 21.7* 11.5*  DDIMER  --   --   --   --  2.69* 2.16* 1.39*  PROCALCITON  --  1.27  --   --  1.12 0.95 0.46  LATICACIDVEN  --   --  1.8 1.6  --   --   --   INR  --   --   --   --  1.2  --   --   BNP  --   --   --   --  947.5* 760.5* 755.7*    ------------------------------------------------------------------------------------------------------------------ Recent Labs    11/19/20 1637  TRIG 89    Lab Results  Component Value Date   HGBA1C (H) 02/08/2011    5.7 (NOTE)                                                                       According to the ADA Clinical Practice Recommendations for 2011, when HbA1c is used as a screening test:   >=6.5%   Diagnostic of Diabetes Mellitus           (if abnormal result  is confirmed)  5.7-6.4%   Increased risk of developing Diabetes Mellitus  References:Diagnosis and Classification of Diabetes Mellitus,Diabetes Care,2011,34(Suppl 1):S62-S69 and Standards of Medical Care in         Diabetes - 2011,Diabetes Care,2011,34  (Suppl 1):S11-S61.   ------------------------------------------------------------------------------------------------------------------ No results for input(s): TSH, T4TOTAL, T3FREE, THYROIDAB in the last 72 hours.  Invalid input(s): FREET3  Cardiac Enzymes No results for input(s): CKMB,  TROPONINI, MYOGLOBIN in the last 168 hours.  Invalid input(s): CK ------------------------------------------------------------------------------------------------------------------    Component Value Date/Time   BNP 755.7 (  H) 11/22/2020 0236    Micro Results Recent Results (from the past 240 hour(s))  Microscopic Examination     Status: Abnormal   Collection Time: 11/13/20 11:36 AM   Urine  Result Value Ref Range Status   WBC, UA 0-5 0 - 5 /hpf Final   RBC 3-10 (A) 0 - 2 /hpf Final   Epithelial Cells (non renal) 0-10 0 - 10 /hpf Final   Renal Epithel, UA None seen None seen /hpf Final   Mucus, UA Present Not Estab. Final   Bacteria, UA Few None seen/Few Final  Urine culture     Status: Abnormal   Collection Time: 11/14/20  4:57 PM   Specimen: Urine, Random  Result Value Ref Range Status   Specimen Description URINE, RANDOM  Final   Special Requests   Final    NONE Performed at [facility], [address] [place], [place] [address]    Culture MULTIPLE SPECIES PRESENT, SUGGEST RECOLLECTION (A)  Final   Report Status 11/16/2020 FINAL  Final  Resp Panel by RT-PCR (Flu A&B, Covid) Nasopharyngeal Swab     Status: Abnormal   Collection Time: 11/19/20  3:40 PM   Specimen: Nasopharyngeal Swab; Nasopharyngeal(NP) swabs in vial transport medium  Result Value Ref Range Status   SARS Coronavirus 2 by RT PCR POSITIVE (A) NEGATIVE Final    Comment: RESULT CALLED TO, READ BACK BY AND VERIFIED WITH: Corlis Leak RN 11/19/20 AT 1805 SK  (NOTE) SARS-CoV-2 target nucleic acids are DETECTED.  The SARS-CoV-2 RNA is generally detectable in upper respiratory specimens during the acute phase of infection. Positive results are indicative of the presence of the identified virus, but do not rule out bacterial infection or co-infection with other pathogens not detected by the test. Clinical correlation with patient history and other diagnostic information is necessary to determine  patient infection status. The expected result is Negative.  Fact Sheet for Patients: EntrepreneurPulse.com.au  Fact Sheet for Healthcare Providers: IncredibleEmployment.be  This test is not yet approved or cleared by the [place] FDA and  has been authorized for detection and/or diagnosis of SARS-CoV-2 by FDA under an Emergency Use Authorization (EUA).  This EUA will remain in effect (meaning this test can be  used) for the duration of  the COVID-19 declaration under Section 564(b)(1) of the Act, 21 U.S.C. section 360bbb-3(b)(1), unless the authorization is terminated or revoked sooner.     Influenza A by PCR NEGATIVE NEGATIVE Final   Influenza B by PCR NEGATIVE NEGATIVE Final    Comment: (NOTE) The Xpert Xpress SARS-CoV-2/FLU/RSV plus assay is intended as an aid in the diagnosis of influenza from Nasopharyngeal swab specimens and should not be used as a sole basis for treatment. Nasal washings and aspirates are unacceptable for Xpert Xpress SARS-CoV-2/FLU/RSV testing.  Fact Sheet for Patients: EntrepreneurPulse.com.au  Fact Sheet for Healthcare Providers: IncredibleEmployment.be  This test is not yet approved or cleared by the [place] FDA and has been authorized for detection and/or diagnosis of SARS-CoV-2 by FDA under an Emergency Use Authorization (EUA). This EUA will remain in effect (meaning this test can be used) for the duration of the COVID-19 declaration under Section 564(b)(1) of the Act, 21 U.S.C. section 360bbb-3(b)(1), unless the authorization is terminated or revoked.  Performed at [facility], [place] [address] [place], [place] [address]   Blood Culture (routine x 2)     Status: None (Preliminary result)   Collection Time: 11/19/20  4:37 PM  Specimen: BLOOD  Result Value Ref Range Status   Specimen Description BLOOD SITE NOT SPECIFIED  Final   Special Requests   Final     BOTTLES DRAWN AEROBIC AND ANAEROBIC Blood Culture adequate volume   Culture   Final    NO GROWTH 3 DAYS Performed at [facility], [address] [place], [place] [address]    Report Status PENDING  Incomplete  Culture, sputum-assessment     Status: None   Collection Time: 11/19/20  7:17 PM   Specimen: Expectorated Sputum  Result Value Ref Range Status   Specimen Description EXPECTORATED SPUTUM  Final   Special Requests NONE  Final   Sputum evaluation   Final    THIS SPECIMEN IS ACCEPTABLE FOR SPUTUM CULTURE Performed at [facility], [place] [address] [place], [place] [address]    Report Status 11/20/2020 FINAL  Final  Culture, respiratory     Status: None (Preliminary result)   Collection Time: 11/19/20  7:17 PM  Result Value Ref Range Status   Specimen Description EXPECTORATED SPUTUM  Final   Special Requests NONE Reflexed from [id number]  Final   Gram Stain   Final    MODERATE WBC PRESENT, PREDOMINANTLY PMN ABUNDANT GRAM POSITIVE COCCI IN PAIRS IN CLUSTERS    Culture   Final    MODERATE STAPHYLOCOCCUS AUREUS SUSCEPTIBILITIES TO FOLLOW Performed at [facility], [place] [address], [place], [place] [address]    Report Status PENDING  Incomplete  Blood Culture (routine x 2)     Status: None (Preliminary result)   Collection Time: 11/19/20  9:10 PM   Specimen: BLOOD LEFT WRIST  Result Value Ref Range Status   Specimen Description BLOOD LEFT WRIST  Final   Special Requests   Final    BOTTLES DRAWN AEROBIC AND ANAEROBIC Blood Culture adequate volume   Culture   Final    NO GROWTH 3 DAYS Performed at [facility], [place] [address] [place], [place] [address]    Report Status PENDING  Incomplete  MRSA PCR Screening     Status: Abnormal   Collection Time: 11/20/20  6:58 AM   Specimen: Nasopharyngeal  Result Value Ref Range Status   MRSA by PCR POSITIVE (A) NEGATIVE Final    Comment:        The GeneXpert MRSA Assay (FDA approved for NASAL  specimens only), is one component of a comprehensive MRSA colonization surveillance program. It is not intended to diagnose MRSA infection nor to guide or monitor treatment for MRSA infections. RESULT CALLED TO, READ BACK BY AND VERIFIED WITH: Lieutenant Diego RN 12:25 11/20/20 ([email or web address]) Performed at [facility], [place] [address] [place],  [address]     Radiology Reports DG Chest 1 View  Result Date: 11/19/2020 CLINICAL DATA:  COVID-19 EXAM: CHEST  1 VIEW COMPARISON:  11/14/2020 and prior. FINDINGS: Patchy right predominant bilateral pulmonary opacities. No pneumothorax or pleural effusion. Stable cardiomediastinal silhouette. No acute osseous abnormality. IMPRESSION: Multifocal pneumonia. Electronically Signed   By: Primitivo Gauze M.D.   On: 11/19/2020 13:25   DG Chest 2 View  Result Date: 10/30/2020 CLINICAL DATA:  Follow-up pneumonia. Shortness of breath. EXAM: CHEST - 2 VIEW COMPARISON:  Radiograph 10/01/2020. Chest CT 09/04/2020. Lung bases from abdominal CT 10/09/2020 FINDINGS: Improved lung volumes from prior exam. Previous right upper lobe opacities are not definitively seen on the current exam. The heart is normal in size. Stable aortic tortuosity and atherosclerosis. Increased AP  diameter of the thorax. Possible small pleural effusions. Coarse lung markings with improvement in vascular congestion. No convincing pulmonary edema or Kerley B-lines. No pneumothorax. Bones are under mineralized. Chronic compression fractures in the mid and lower thoracic spine. IMPRESSION: 1. Improved lung volumes. Previous right upper lobe opacities are not definitively seen on the current exam. 2. Possible small pleural effusions. 3. Improved vascular congestion. Electronically Signed   By: Keith Rake M.D.   On: 10/30/2020 10:28   US RENAL  Result Date: 11/20/2020 CLINICAL DATA:  Acute kidney injury EXAM: RENAL / URINARY TRACT ULTRASOUND COMPLETE COMPARISON:  CT 10/09/2020 FINDINGS:  Right Kidney: Renal measurements: 9.4 by 4.5 x 4.6 cm = volume: 101.8 mL. Echogenicity within normal limits. No hydronephrosis. Multiple cysts, fewer than 10. The largest cyst in the upper pole measures 7.9 x 4.5 x 6.5 cm. Lower pole cyst measures 4.3 x 3.2 x 4 cm. Left Kidney: Renal measurements: 9.8 x 4.7 x 4.4 cm = volume: 146.8 mL. Echogenicity within normal limits. No hydronephrosis. Cortical thinning at the poles. Multiple cysts, fewer than 10. The largest is seen in the lower pole and measures 3.9 x 3.3 x 2.9 cm. Bladder: Appears normal for degree of bladder distention. Other: None. IMPRESSION: 1. Negative for hydronephrosis. 2. Mild cortical thinning at the poles of the left kidney suggesting mild atrophy. 3. Multiple simple cysts within the kidneys. Electronically Signed   By: Donavan Foil M.D.   On: 11/20/2020 20:56   DG Chest Port 1 View  Result Date: 11/21/2020 CLINICAL DATA:  Shortness of breath, COVID-19 positive history hypertension, prostate cancer, colon cancer EXAM: PORTABLE CHEST 1 VIEW COMPARISON:  Portable exam 0755 hours compared to 11/19/2020 FINDINGS: Enlargement of cardiac silhouette. Tortuous aorta. Mediastinal contours normal. BILATERAL patchy pulmonary infiltrates RIGHT greater than LEFT consistent with multifocal pneumonia and COVID-19. No pleural effusion or pneumothorax. Bones demineralized. IMPRESSION: BILATERAL pulmonary infiltrates consistent with multifocal pneumonia and COVID-19, not significantly changed. Electronically Signed   By: Lavonia Dana M.D.   On: 11/21/2020 08:53   DG Chest Portable 1 View  Result Date: 11/14/2020 CLINICAL DATA:  COVID-19 positive.  Weakness. EXAM: PORTABLE CHEST 1 VIEW COMPARISON:  10/29/2020 FINDINGS: The heart size and mediastinal contours are within normal limits. Both lungs are clear. The visualized skeletal structures are unremarkable. IMPRESSION: No active disease. Electronically Signed   By: Franchot Gallo M.D.   On: 11/14/2020 13:44    VAS [place] LOWER EXTREMITY VENOUS (DVT)  Result Date: 11/20/2020  Lower Venous DVT Study Indications: Swelling. Other Indications: Elevated D-Dimer. Comparison Study: No previous exam Performing Technologist: Vonzell Schlatter RVT  Examination Guidelines: A complete evaluation includes B-mode imaging, spectral Doppler, color Doppler, and power Doppler as needed of all accessible portions of each vessel. Bilateral testing is considered an integral part of a complete examination. Limited examinations for reoccurring indications may be performed as noted. The reflux portion of the exam is performed with the patient in reverse Trendelenburg.  +---------+---------------+---------+-----------+----------+--------------+ RIGHT    CompressibilityPhasicitySpontaneityPropertiesThrombus Aging +---------+---------------+---------+-----------+----------+--------------+ CFV      Full           Yes      Yes                                 +---------+---------------+---------+-----------+----------+--------------+ SFJ      Full                                                        +---------+---------------+---------+-----------+----------+--------------+  FV Prox  Full                                                        +---------+---------------+---------+-----------+----------+--------------+ FV Mid   Full                                                        +---------+---------------+---------+-----------+----------+--------------+ FV DistalFull                                                        +---------+---------------+---------+-----------+----------+--------------+ PFV      Full                                                        +---------+---------------+---------+-----------+----------+--------------+ POP      Full           Yes      Yes                                 +---------+---------------+---------+-----------+----------+--------------+ PTV       Full                                                        +---------+---------------+---------+-----------+----------+--------------+ PERO     Full                                                        +---------+---------------+---------+-----------+----------+--------------+   +---------+---------------+---------+-----------+----------+--------------+ LEFT     CompressibilityPhasicitySpontaneityPropertiesThrombus Aging +---------+---------------+---------+-----------+----------+--------------+ CFV      Full           Yes      Yes                                 +---------+---------------+---------+-----------+----------+--------------+ SFJ      Full                                                        +---------+---------------+---------+-----------+----------+--------------+ FV Prox  Full                                                        +---------+---------------+---------+-----------+----------+--------------+  FV Mid   Full                                                        +---------+---------------+---------+-----------+----------+--------------+ FV DistalFull                                                        +---------+---------------+---------+-----------+----------+--------------+ PFV      Full                                                        +---------+---------------+---------+-----------+----------+--------------+ POP      Full           Yes      Yes                                 +---------+---------------+---------+-----------+----------+--------------+ PTV      Full                                                        +---------+---------------+---------+-----------+----------+--------------+ PERO     Full                                                        +---------+---------------+---------+-----------+----------+--------------+     Summary: RIGHT: - There is no evidence of deep vein  thrombosis in the lower extremity.  - No cystic structure found in the popliteal fossa.  LEFT: - There is no evidence of deep vein thrombosis in the lower extremity.  - No cystic structure found in the popliteal fossa.  *See table(s) above for measurements and observations. Electronically signed by Monica Martinez MD on 11/20/2020 at 5:01:37 PM.    Final

## 2020-11-22 NOTE — Progress Notes (Signed)
PT Cancellation Note  Patient Details Name: Tony Holt MRN: [id number] DOB: 1926-05-18   Cancelled Treatment:    Reason Eval/Treat Not Completed: Medical issues which prohibited therapy   Patient reports severe nausea (has not even touched his breakfast tray). RN made aware he needs nausea meds. Hopeful PT to return today and see pt   Arby Barrette, PT Pager [phone]   Rexanne Mano 11/22/2020, 9:39 AM

## 2020-11-22 NOTE — Progress Notes (Signed)
Pharmacy Antibiotic Note  Tony Holt is a 86 y.o. male admitted on 11/19/2020 with pneumonia.  Pharmacy has been consulted for Vancomycin dosing. CrCl 22 ml/min  Plan: Vanc 1250 mg IV x 1, then 500 mg IV q24hr Monitor renal function, clinical status and vanc levels as needed     Temp (24hrs), Avg:97.4 F (36.3 C), Min:97.1 F (36.2 C), Max:97.7 F (36.5 C)  Recent Labs  Lab 11/19/20 1230 11/19/20 1637 11/19/20 2126 11/20/20 0557 11/21/20 0052 11/22/20 0236  WBC 10.6*  --   --  8.3 10.1 9.3  CREATININE 2.62*  --   --  2.36* 2.04* 1.78*  LATICACIDVEN  --  1.8 1.6  --   --   --     Estimated Creatinine Clearance: 22.1 mL/min (A) (by C-G formula based on SCr of 1.78 mg/dL (H)).    No Known Allergies  Antimicrobials this admission: Vanc 12/11 >>    Microbiology results: 12/8 Sputum: MRSA    Thank you for allowing pharmacy to be a part of this patient's care.  Alanda Slim, PharmD, [facility] Clinical Pharmacist Please see AMION for all Pharmacists' Contact Phone Numbers 11/22/2020, 1:14 PM

## 2020-11-23 LAB — CBC WITH DIFFERENTIAL/PLATELET
Abs Immature Granulocytes: 0.23 10*3/uL — ABNORMAL HIGH (ref 0.00–0.07)
Basophils Absolute: 0 10*3/uL (ref 0.0–0.1)
Basophils Relative: 0 %
Eosinophils Absolute: 0 10*3/uL (ref 0.0–0.5)
Eosinophils Relative: 0 %
HCT: 30.9 % — ABNORMAL LOW (ref 39.0–52.0)
Hemoglobin: 10.3 g/dL — ABNORMAL LOW (ref 13.0–17.0)
Immature Granulocytes: 3 %
Lymphocytes Relative: 3 %
Lymphs Abs: 0.3 10*3/uL — ABNORMAL LOW (ref 0.7–4.0)
MCH: 30.7 pg (ref 26.0–34.0)
MCHC: 33.3 g/dL (ref 30.0–36.0)
MCV: 92 fL (ref 80.0–100.0)
Monocytes Absolute: 0.3 10*3/uL (ref 0.1–1.0)
Monocytes Relative: 3 %
Neutro Abs: 7.5 10*3/uL (ref 1.7–7.7)
Neutrophils Relative %: 91 %
Platelets: 204 10*3/uL (ref 150–400)
RBC: 3.36 MIL/uL — ABNORMAL LOW (ref 4.22–5.81)
RDW: 13.8 % (ref 11.5–15.5)
WBC: 8.3 10*3/uL (ref 4.0–10.5)
nRBC: 0 % (ref 0.0–0.2)

## 2020-11-23 LAB — BRAIN NATRIURETIC PEPTIDE: B Natriuretic Peptide: 739.1 pg/mL — ABNORMAL HIGH (ref 0.0–100.0)

## 2020-11-23 LAB — D-DIMER, QUANTITATIVE: D-Dimer, Quant: 1.78 ug/mL-FEU — ABNORMAL HIGH (ref 0.00–0.50)

## 2020-11-23 LAB — COMPREHENSIVE METABOLIC PANEL
ALT: 21 U/L (ref 0–44)
AST: 15 U/L (ref 15–41)
Albumin: 2.5 g/dL — ABNORMAL LOW (ref 3.5–5.0)
Alkaline Phosphatase: 76 U/L (ref 38–126)
Anion gap: 11 (ref 5–15)
BUN: 61 mg/dL — ABNORMAL HIGH (ref 8–23)
CO2: 16 mmol/L — ABNORMAL LOW (ref 22–32)
Calcium: 8.9 mg/dL (ref 8.9–10.3)
Chloride: 103 mmol/L (ref 98–111)
Creatinine, Ser: 1.48 mg/dL — ABNORMAL HIGH (ref 0.61–1.24)
GFR, Estimated: 44 mL/min — ABNORMAL LOW (ref >60)
Glucose, Bld: 139 mg/dL — ABNORMAL HIGH (ref 70–99)
Potassium: 4.3 mmol/L (ref 3.5–5.1)
Sodium: 130 mmol/L — ABNORMAL LOW (ref 135–145)
Total Bilirubin: 0.8 mg/dL (ref 0.3–1.2)
Total Protein: 5.4 g/dL — ABNORMAL LOW (ref 6.5–8.1)

## 2020-11-23 LAB — MAGNESIUM: Magnesium: 2 mg/dL (ref 1.7–2.4)

## 2020-11-23 LAB — C-REACTIVE PROTEIN: CRP: 7.1 mg/dL — ABNORMAL HIGH (ref <1.0)

## 2020-11-23 LAB — PROCALCITONIN: Procalcitonin: 0.21 ng/mL

## 2020-11-23 MED ORDER — FUROSEMIDE 40 MG PO TABS
40.0000 mg | ORAL_TABLET | Freq: Once | ORAL | Status: AC
  Administered 2020-11-23: 13:00:00 40 mg via ORAL
  Filled 2020-11-23: qty 1

## 2020-11-23 MED ORDER — ALBUTEROL SULFATE HFA 108 (90 BASE) MCG/ACT IN AERS: 2.0000 | INHALATION_SPRAY | Freq: Four times a day (QID) | RESPIRATORY_TRACT | 0 refills | Status: AC | PRN

## 2020-11-23 MED ORDER — SODIUM BICARBONATE 650 MG PO TABS
650.0000 mg | ORAL_TABLET | Freq: Three times a day (TID) | ORAL | Status: DC
  Administered 2020-11-23: 09:00:00 650 mg via ORAL
  Filled 2020-11-23: qty 1

## 2020-11-23 MED ORDER — METHYLPREDNISOLONE 4 MG PO TBPK: ORAL_TABLET | ORAL | 0 refills | Status: DC

## 2020-11-23 MED ORDER — DOXYCYCLINE HYCLATE 100 MG PO CAPS
100.0000 mg | ORAL_CAPSULE | Freq: Two times a day (BID) | ORAL | 0 refills | Status: DC
Start: 2020-11-23 — End: 2021-04-18

## 2020-11-23 MED ORDER — SODIUM BICARBONATE 325 MG PO TABS: 325.0000 mg | ORAL_TABLET | Freq: Two times a day (BID) | ORAL | 0 refills | Status: AC

## 2020-11-23 NOTE — Discharge Instructions (Signed)
Follow with Primary MD [name], MD in 7 days   Get CBC, CMP, 2 view Chest X ray -  checked next visit within 1 week by Primary MD    Activity: As tolerated with Full fall precautions use walker/cane & assistance as needed  Disposition Home    Diet: Heart Healthy    Special Instructions: If you have smoked or chewed Tobacco  in the last 2 yrs please stop smoking, stop any regular Alcohol  and or any Recreational drug use.  On your next visit with your primary care physician please Get Medicines reviewed and adjusted.  Please request your Prim.MD to go over all Hospital Tests and Procedure/Radiological results at the follow up, please get all Hospital records sent to your Prim MD by signing hospital release before you go home.  If you experience worsening of your admission symptoms, develop shortness of breath, life threatening emergency, suicidal or homicidal thoughts you must seek medical attention immediately by calling 911 or calling your MD immediately  if symptoms less severe.  You Must read complete instructions/literature along with all the possible adverse reactions/side effects for all the Medicines you take and that have been prescribed to you. Take any new Medicines after you have completely understood and accpet all the possible adverse reactions/side effects.       Person Under Monitoring Name: Tony Holt  Location: [address] [place] [address]   Infection Prevention Recommendations for Individuals Confirmed to have, or Being Evaluated for, 2019 Novel Coronavirus (COVID-19) Infection Who Receive Care at Home  Individuals who are confirmed to have, or are being evaluated for, COVID-19 should follow the prevention steps below until a healthcare provider or local or state health department says they can return to normal activities.  Stay home except to get medical care You should restrict activities outside your home, except for getting  medical care. Do not go to work, school, or public areas, and do not use public transportation or taxis.  Call ahead before visiting your doctor Before your medical appointment, call the healthcare provider and tell them that you have, or are being evaluated for, COVID-19 infection. This will help the healthcare providers office take steps to keep other people from getting infected. Ask your healthcare provider to call the local or state health department.  Monitor your symptoms Seek prompt medical attention if your illness is worsening (e.g., difficulty breathing). Before going to your medical appointment, call the healthcare provider and tell them that you have, or are being evaluated for, COVID-19 infection. Ask your healthcare provider to call the local or state health department.  Wear a facemask You should wear a facemask that covers your nose and mouth when you are in the same room with other people and when you visit a healthcare provider. People who live with or visit you should also wear a facemask while they are in the same room with you.  Separate yourself from other people in your home As much as possible, you should stay in a different room from other people in your home. Also, you should use a separate bathroom, if available.  Avoid sharing household items You should not share dishes, drinking glasses, cups, eating utensils, towels, bedding, or other items with other people in your home. After using these items, you should wash them thoroughly with soap and water.  Cover your coughs and sneezes Cover your mouth and nose with a tissue when you cough or sneeze, or you can cough or  sneeze into your sleeve. Throw used tissues in a lined trash can, and immediately wash your hands with soap and water for at least 20 seconds or use an alcohol-based hand rub.  Wash your [facility] your hands often and thoroughly with soap and water for at least 20 seconds. You can use an  alcohol-based hand sanitizer if soap and water are not available and if your hands are not visibly dirty. Avoid touching your eyes, nose, and mouth with unwashed hands.   Prevention Steps for Caregivers and Household Members of Individuals Confirmed to have, or Being Evaluated for, COVID-19 Infection Being Cared for in the Home  If you live with, or provide care at home for, a person confirmed to have, or being evaluated for, COVID-19 infection please follow these guidelines to prevent infection:  Follow healthcare providers instructions Make sure that you understand and can help the patient follow any healthcare provider instructions for all care.  Provide for the patients basic needs You should help the patient with basic needs in the home and provide support for getting groceries, prescriptions, and other personal needs.  Monitor the patients symptoms If they are getting sicker, call his or her medical provider and tell them that the patient has, or is being evaluated for, COVID-19 infection. This will help the healthcare providers office take steps to keep other people from getting infected. Ask the healthcare provider to call the local or state health department.  Limit the number of people who have contact with the patient  If possible, have only one caregiver for the patient.  Other household members should stay in another home or place of residence. If this is not possible, they should stay  in another room, or be separated from the patient as much as possible. Use a separate bathroom, if available.  Restrict visitors who do not have an essential need to be in the home.  Keep older adults, very young children, and other sick people away from the patient Keep older adults, very young children, and those who have compromised immune systems or chronic health conditions away from the patient. This includes people with chronic heart, lung, or kidney conditions, diabetes, and  cancer.  Ensure good ventilation Make sure that shared spaces in the home have good air flow, such as from an air conditioner or an opened window, weather permitting.  Wash your hands often  Wash your hands often and thoroughly with soap and water for at least 20 seconds. You can use an alcohol based hand sanitizer if soap and water are not available and if your hands are not visibly dirty.  Avoid touching your eyes, nose, and mouth with unwashed hands.  Use disposable paper towels to dry your hands. If not available, use dedicated cloth towels and replace them when they become wet.  Wear a facemask and gloves  Wear a disposable facemask at all times in the room and gloves when you touch or have contact with the patients blood, body fluids, and/or secretions or excretions, such as sweat, saliva, sputum, nasal mucus, vomit, urine, or feces.  Ensure the mask fits over your nose and mouth tightly, and do not touch it during use.  Throw out disposable facemasks and gloves after using them. Do not reuse.  Wash your hands immediately after removing your facemask and gloves.  If your personal clothing becomes contaminated, carefully remove clothing and launder. Wash your hands after handling contaminated clothing.  Place all used disposable facemasks, gloves, and other waste  in a lined container before disposing them with other household waste.  Remove gloves and wash your hands immediately after handling these items.  Do not share dishes, glasses, or other household items with the patient  Avoid sharing household items. You should not share dishes, drinking glasses, cups, eating utensils, towels, bedding, or other items with a patient who is confirmed to have, or being evaluated for, COVID-19 infection.  After the person uses these items, you should wash them thoroughly with soap and water.  Wash laundry thoroughly  Immediately remove and wash clothes or bedding that have blood, body  fluids, and/or secretions or excretions, such as sweat, saliva, sputum, nasal mucus, vomit, urine, or feces, on them.  Wear gloves when handling laundry from the patient.  Read and follow directions on labels of laundry or clothing items and detergent. In general, wash and dry with the warmest temperatures recommended on the label.  Clean all areas the individual has used often  Clean all touchable surfaces, such as counters, tabletops, doorknobs, bathroom fixtures, toilets, phones, keyboards, tablets, and bedside tables, every day. Also, clean any surfaces that may have blood, body fluids, and/or secretions or excretions on them.  Wear gloves when cleaning surfaces the patient has come in contact with.  Use a diluted bleach solution (e.g., dilute bleach with 1 part bleach and 10 parts water) or a household disinfectant with a label that says EPA-registered for coronaviruses. To make a bleach solution at home, add 1 tablespoon of bleach to 1 quart (4 cups) of water. For a larger supply, add  cup of bleach to 1 gallon (16 cups) of water.  Read labels of cleaning products and follow recommendations provided on product labels. Labels contain instructions for safe and effective use of the cleaning product including precautions you should take when applying the product, such as wearing gloves or eye protection and making sure you have good ventilation during use of the product.  Remove gloves and wash hands immediately after cleaning.  Monitor yourself for signs and symptoms of illness Caregivers and household members are considered close contacts, should monitor their health, and will be asked to limit movement outside of the home to the extent possible. Follow the monitoring steps for close contacts listed on the symptom monitoring form.   ? If you have additional questions, contact your local health department or call the epidemiologist on call at [phone] (available 24/7). ? This  guidance is subject to change. For the most up-to-date guidance from [facility], please refer to their website: [email or web address]

## 2020-11-23 NOTE — TOC Transition Note (Signed)
Transition of Care [facility]) - CM/SW Discharge Note   Patient Details  Name: Tony Holt MRN: [id number] Date of Birth: 1926/01/08  Transition of Care [facility] CM/SW Contact:  Carles Collet, RN Phone Number: 11/23/2020, 10:00 AM   Clinical Narrative:   Received call back from patoent's daughter. She states patient has RW. 3/1, any needed DME at home already. She is agreeable to [name] services, stated that he has declined them in the past but agrees that he would benefit from them, she did not a preference for [facility] provider, explained I would choose a 4 or 5 star agency that accepts his insurance. Alvis Lemmings accepted referral. No other CM needs identified.     Final next level of care: [place] Barriers to Discharge: No Barriers Identified   Patient Goals and CMS Choice Patient states their goals for this hospitalization and ongoing recovery are:: to go home CMS Medicare.gov Compare Post Acute Care list provided to:: Other (Comment Required) Choice offered to / list presented to : Adult Children  Discharge Placement                       Discharge Plan and Services   Discharge Planning Services: CM Consult            DME Arranged: N/A         HH Arranged: PT,OT,RN [place]: [place] Date [facility] Agency Contacted: 11/23/20 Time HH Agency Contacted: 1000 Representative spoke with at [place]: [place]) Interventions     Readmission Risk Interventions Readmission Risk Prevention Plan 02/07/2020  Transportation Screening Complete  PCP or Specialist Appt within 3-5 Days Not Complete  Not Complete comments office not accepting voicemails. states they will reopen tomorrow at 10.  [place] or Home Care Consult Complete  Social Work Consult for [place] Planning/Counseling Complete  Palliative Care Screening Not Applicable  Medication Review Press photographer) Complete  Some recent data might be hidden

## 2020-11-23 NOTE — Discharge Summary (Signed)
Tony Holt UEK:[id number] DOB: [birth year] DOA: 11/19/2020  PCP: [name], MD  Admit date: 11/19/2020  Discharge date: 11/23/2020  Admitted From: Home   Disposition:  Home   Recommendations for Outpatient Follow-up:   Follow up with PCP in 1-2 weeks  PCP Please obtain BMP/CBC, 2 view CXR in 1week,  (see Discharge instructions)   PCP Please follow up on the following pending results: Check CBC, CMP and a two-view chest x-ray in 1 to 2 weeks.   Home Health: PT, RN, SW   Equipment/Devices: #in1, Environmental consultant  Consultations: None  Discharge Condition: Stable    CODE STATUS: DNR    Diet Recommendation: Heart Healthy   Diet Order            Diet - low sodium heart healthy           Diet Heart Room service appropriate? Yes; Fluid consistency: Thin  Diet effective now                  CC - SOB   Brief history of present illness from the day of admission and additional interim summary    86 year old male with past medical history of chronic kidney disease stage IIIa, gastroesophageal reflux disease, hypertension, benign prostatic hyperplasia, prostate cancer, chronic atrial fibrillation, iron deficiency anemia, cecal adenocarcinoma(Dx 01/2020)who presents to [facility] emergency department with complaints of generalized weakness shortness of breath and cough, he is unvaccinated for Covid and diagnosed with COVID-19 pneumonia with acute hypoxic respiratory failure admitted to the hospital.                                                                 Hospital Course   1. Acute Hypoxic Resp. Failure due to Acute Covid 19 Viral Pneumonitis during the ongoing 2020 Covid 19 Pandemic - he is unfortunately not vaccinated, he was diagnosed with COVID-19 infection on December 1, he has received  antibody infusion in the outpatient setting with no improvement.    Was treated with steroids and Remdesivir, has finished his remdesivir infusion and will get oral steroid taper along with rescue inhaler.  Currently symptom-free on room air.  There was also hint of bacterial bronchitis with sputum cultures growing MRSA.  Not toxic or septic.  Was treated with IV vancomycin here, currently appears stable with stable downtrending procalcitonin and no leukocytosis.  Will be given 6 more days of oral doxycycline upon discharge.  Follow with PCP in a week.   Recent Labs  Lab 11/19/20 1230 11/19/20 1540 11/19/20 1637 11/19/20 2126 11/20/20 0557 11/21/20 0052 11/22/20 0236 11/23/20 0355  WBC 10.6*  --   --   --  8.3 10.1 9.3 8.3  CRP  --  35.4*  --   --  32.0* 21.7* 11.5* 7.1*  DDIMER  --   --   --   --  2.69* 2.16* 1.39* 1.78*  BNP  --   --   --   --  947.5* 760.5* 755.7* 739.1*  PROCALCITON  --  1.27  --   --  1.12 0.95 0.46 0.21  LATICACIDVEN  --   --  1.8 1.6  --   --   --   --   AST 19  --   --   --  29 26 20 15   ALT 20  --   --   --  24 24 24 21   ALKPHOS 118  --   --   --  118 103 88 76  BILITOT 0.7  --   --   --  0.8 0.7 0.7 0.8  ALBUMIN 3.0*  --   --   --  2.6* 2.5* 2.5* 2.5*  INR  --   --   --   --  1.2  --   --   --   SARSCOV2NAA  --  POSITIVE*  --   --   --   --   --   --       2.  Moderately elevated D-dimer.  Likely due to inflammation for COVID-19, he is on intermediate dose Lovenox, D-dimer is trending down and leg ultrasound is negative for acute DVT.  3.  Advanced age, deconditioning, frail status, moderate protein calorie malnutrition.  Supportive care, PT-OT, protein supplements were given, family wishes to take him home.  Will be discharged home with home PT, RN and walker along with a 3 and 1   4.  GERD.  PPI.  5.  BPH.  On Flomax.  6.  AKI on top of CKD stage 3B.  Baseline creatinine around 1.5.  Renal function is improved, also noted some chronic metabolic  acidosis and been placed on oral bicarb for 2 weeks thereafter per PCP.   Discharge diagnosis     Principal Problem:   COVID-19 virus infection Active Problems:   Essential hypertension   Iron deficiency anemia due to chronic blood loss   Pneumonia of both lungs due to infectious organism   Acute respiratory failure with hypoxia (HCC)   Acute renal failure superimposed on stage 3a chronic kidney disease (HCC)   BPH without obstruction/lower urinary tract symptoms   GERD without esophagitis   Acute metabolic encephalopathy   Protein-calorie malnutrition ([place])    Discharge instructions    Discharge Instructions    Diet - low sodium heart healthy   Complete by: As directed    Discharge instructions   Complete by: As directed    Follow with Primary MD [name], MD in 7 days   Get CBC, CMP, 2 view Chest X ray -  checked next visit within 1 week by Primary MD    Activity: As tolerated with Full fall precautions use walker/cane & assistance as needed  Disposition Home    Diet: Heart Healthy    Special Instructions: If you have smoked or chewed Tobacco  in the last 2 yrs please stop smoking, stop any regular Alcohol  and or any Recreational drug use.  On your next visit with your primary care physician please Get Medicines reviewed and adjusted.  Please request your Prim.MD to go over all Hospital Tests and Procedure/Radiological results at the follow up, please get all Hospital records sent to your Prim MD by signing hospital release before you go home.  If you experience worsening of your admission symptoms, develop shortness of breath, life threatening emergency, suicidal  or homicidal thoughts you must seek medical attention immediately by calling 911 or calling your MD immediately  if symptoms less severe.  You Must read complete instructions/literature along with all the possible adverse reactions/side effects for all the Medicines you take and that have been  prescribed to you. Take any new Medicines after you have completely understood and accpet all the possible adverse reactions/side effects.   Increase activity slowly   Complete by: As directed    MyChart COVID-19 home monitoring program   Complete by: Nov 23, 2020    Is the patient willing to use the [place] for home monitoring?: Yes   Temperature monitoring   Complete by: Nov 23, 2020    After how many days would you like to receive a notification of this patient's flowsheet entries?: 1      Discharge Medications   Allergies as of 11/23/2020   No Known Allergies     Medication List    TAKE these medications   albuterol 108 (90 Base) MCG/ACT inhaler Commonly known as: VENTOLIN HFA Inhale 2 puffs into the lungs every 6 (six) hours as needed for wheezing or shortness of breath.   Aspirin 81 MG Caps Take 81 mg by mouth daily.   CULTURELLE PROBIOTICS PO Take 1 tablet by mouth daily.   diphenhydramine-acetaminophen 25-500 MG Tabs tablet Commonly known as: TYLENOL PM Take 4 tablets by mouth at bedtime.   docusate sodium 100 MG capsule Commonly known as: COLACE Take 1 capsule (100 mg total) by mouth 2 (two) times daily as needed for mild constipation.   doxycycline 100 MG capsule Commonly known as: VIBRAMYCIN Take 1 capsule (100 mg total) by mouth 2 (two) times daily.   Ensure Take 237 mLs by mouth in the morning, at noon, and at bedtime.   furosemide 20 MG tablet Commonly known as: LASIX Take 20 mg by mouth daily as needed for fluid.   Gemtesa 75 MG Tabs Generic drug: Vibegron Take 1 capsule by mouth daily. What changed: how much to take   HYDROcodone-acetaminophen 7.5-325 MG tablet Commonly known as: NORCO Take 1 tablet by mouth in the morning, at noon, in the evening, and at bedtime.   methylPREDNISolone 4 MG Tbpk tablet Commonly known as: MEDROL DOSEPAK follow package directions   mirabegron ER 50 MG Tb24 tablet Commonly known as:  MYRBETRIQ Take 1 tablet (50 mg total) by mouth daily.   multivitamin with minerals tablet Take 1 tablet by mouth daily.   omeprazole 20 MG capsule Commonly known as: PRILOSEC Take 20 mg by mouth 2 (two) times daily.   ondansetron 4 MG disintegrating tablet Commonly known as: ZOFRAN-ODT Take 4 mg by mouth daily as needed for nausea or vomiting.   sodium bicarbonate 325 MG tablet Take 1 tablet (325 mg total) by mouth 2 (two) times daily for 14 days.   tamsulosin 0.4 MG Caps capsule Commonly known as: FLOMAX Take 1 capsule (0.4 mg total) by mouth in the morning and at bedtime.            Durable Medical Equipment  (From admission, onward)         Start     Ordered   11/23/20 0732  For home use only DME 3 n 1  Once        11/23/20 0731   11/23/20 0731  For home use only DME Walker rolling  Once       Comments: 5 wheel  Question Answer Comment  Walker:  With 5 Inch Wheels   Patient needs a walker to treat with the following condition Weakness      11/23/20 0731           Follow-up Information    [name], MD. Schedule an appointment as soon as possible for a visit.   Specialty: Family Medicine Why: 1-2 weeks Contact information: [address] [place] [address] [phone]               Major procedures and Radiology Reports - PLEASE review detailed and final reports thoroughly  -       DG Chest 1 View  Result Date: 11/19/2020 CLINICAL DATA:  COVID-19 EXAM: CHEST  1 VIEW COMPARISON:  11/14/2020 and prior. FINDINGS: Patchy right predominant bilateral pulmonary opacities. No pneumothorax or pleural effusion. Stable cardiomediastinal silhouette. No acute osseous abnormality. IMPRESSION: Multifocal pneumonia. Electronically Signed   By: Primitivo Gauze M.D.   On: 11/19/2020 13:25   DG Chest 2 View  Result Date: 10/30/2020 CLINICAL DATA:  Follow-up pneumonia. Shortness of breath. EXAM: CHEST - 2 VIEW COMPARISON:  Radiograph 10/01/2020.  Chest CT 09/04/2020. Lung bases from abdominal CT 10/09/2020 FINDINGS: Improved lung volumes from prior exam. Previous right upper lobe opacities are not definitively seen on the current exam. The heart is normal in size. Stable aortic tortuosity and atherosclerosis. Increased AP diameter of the thorax. Possible small pleural effusions. Coarse lung markings with improvement in vascular congestion. No convincing pulmonary edema or Kerley B-lines. No pneumothorax. Bones are under mineralized. Chronic compression fractures in the mid and lower thoracic spine. IMPRESSION: 1. Improved lung volumes. Previous right upper lobe opacities are not definitively seen on the current exam. 2. Possible small pleural effusions. 3. Improved vascular congestion. Electronically Signed   By: Keith Rake M.D.   On: 10/30/2020 10:28   US RENAL  Result Date: 11/20/2020 CLINICAL DATA:  Acute kidney injury EXAM: RENAL / URINARY TRACT ULTRASOUND COMPLETE COMPARISON:  CT 10/09/2020 FINDINGS: Right Kidney: Renal measurements: 9.4 by 4.5 x 4.6 cm = volume: 101.8 mL. Echogenicity within normal limits. No hydronephrosis. Multiple cysts, fewer than 10. The largest cyst in the upper pole measures 7.9 x 4.5 x 6.5 cm. Lower pole cyst measures 4.3 x 3.2 x 4 cm. Left Kidney: Renal measurements: 9.8 x 4.7 x 4.4 cm = volume: 146.8 mL. Echogenicity within normal limits. No hydronephrosis. Cortical thinning at the poles. Multiple cysts, fewer than 10. The largest is seen in the lower pole and measures 3.9 x 3.3 x 2.9 cm. Bladder: Appears normal for degree of bladder distention. Other: None. IMPRESSION: 1. Negative for hydronephrosis. 2. Mild cortical thinning at the poles of the left kidney suggesting mild atrophy. 3. Multiple simple cysts within the kidneys. Electronically Signed   By: Donavan Foil M.D.   On: 11/20/2020 20:56   DG Chest Port 1 View  Result Date: 11/21/2020 CLINICAL DATA:  Shortness of breath, COVID-19 positive history  hypertension, prostate cancer, colon cancer EXAM: PORTABLE CHEST 1 VIEW COMPARISON:  Portable exam 0755 hours compared to 11/19/2020 FINDINGS: Enlargement of cardiac silhouette. Tortuous aorta. Mediastinal contours normal. BILATERAL patchy pulmonary infiltrates RIGHT greater than LEFT consistent with multifocal pneumonia and COVID-19. No pleural effusion or pneumothorax. Bones demineralized. IMPRESSION: BILATERAL pulmonary infiltrates consistent with multifocal pneumonia and COVID-19, not significantly changed. Electronically Signed   By: Lavonia Dana M.D.   On: 11/21/2020 08:53   DG Chest Portable 1 View  Result Date: 11/14/2020 CLINICAL DATA:  COVID-19 positive.  Weakness.  EXAM: PORTABLE CHEST 1 VIEW COMPARISON:  10/29/2020 FINDINGS: The heart size and mediastinal contours are within normal limits. Both lungs are clear. The visualized skeletal structures are unremarkable. IMPRESSION: No active disease. Electronically Signed   By: Franchot Gallo M.D.   On: 11/14/2020 13:44   VAS [place] LOWER EXTREMITY VENOUS (DVT)  Result Date: 11/20/2020  Lower Venous DVT Study Indications: Swelling. Other Indications: Elevated D-Dimer. Comparison Study: No previous exam Performing Technologist: Vonzell Schlatter RVT  Examination Guidelines: A complete evaluation includes B-mode imaging, spectral Doppler, color Doppler, and power Doppler as needed of all accessible portions of each vessel. Bilateral testing is considered an integral part of a complete examination. Limited examinations for reoccurring indications may be performed as noted. The reflux portion of the exam is performed with the patient in reverse Trendelenburg.  +---------+---------------+---------+-----------+----------+--------------+ RIGHT    CompressibilityPhasicitySpontaneityPropertiesThrombus Aging +---------+---------------+---------+-----------+----------+--------------+ CFV      Full           Yes      Yes                                  +---------+---------------+---------+-----------+----------+--------------+ SFJ      Full                                                        +---------+---------------+---------+-----------+----------+--------------+ FV Prox  Full                                                        +---------+---------------+---------+-----------+----------+--------------+ FV Mid   Full                                                        +---------+---------------+---------+-----------+----------+--------------+ FV DistalFull                                                        +---------+---------------+---------+-----------+----------+--------------+ PFV      Full                                                        +---------+---------------+---------+-----------+----------+--------------+ POP      Full           Yes      Yes                                 +---------+---------------+---------+-----------+----------+--------------+ PTV      Full                                                        +---------+---------------+---------+-----------+----------+--------------+  PERO     Full                                                        +---------+---------------+---------+-----------+----------+--------------+   +---------+---------------+---------+-----------+----------+--------------+ LEFT     CompressibilityPhasicitySpontaneityPropertiesThrombus Aging +---------+---------------+---------+-----------+----------+--------------+ CFV      Full           Yes      Yes                                 +---------+---------------+---------+-----------+----------+--------------+ SFJ      Full                                                        +---------+---------------+---------+-----------+----------+--------------+ FV Prox  Full                                                         +---------+---------------+---------+-----------+----------+--------------+ FV Mid   Full                                                        +---------+---------------+---------+-----------+----------+--------------+ FV DistalFull                                                        +---------+---------------+---------+-----------+----------+--------------+ PFV      Full                                                        +---------+---------------+---------+-----------+----------+--------------+ POP      Full           Yes      Yes                                 +---------+---------------+---------+-----------+----------+--------------+ PTV      Full                                                        +---------+---------------+---------+-----------+----------+--------------+ PERO     Full                                                        +---------+---------------+---------+-----------+----------+--------------+  Summary: RIGHT: - There is no evidence of deep vein thrombosis in the lower extremity.  - No cystic structure found in the popliteal fossa.  LEFT: - There is no evidence of deep vein thrombosis in the lower extremity.  - No cystic structure found in the popliteal fossa.  *See table(s) above for measurements and observations. Electronically signed by Monica Martinez MD on 11/20/2020 at 5:01:37 PM.    Final     Micro Results    Recent Results (from the past 240 hour(s))  Microscopic Examination     Status: Abnormal   Collection Time: 11/13/20 11:36 AM   Urine  Result Value Ref Range Status   WBC, UA 0-5 0 - 5 /hpf Final   RBC 3-10 (A) 0 - 2 /hpf Final   Epithelial Cells (non renal) 0-10 0 - 10 /hpf Final   Renal Epithel, UA None seen None seen /hpf Final   Mucus, UA Present Not Estab. Final   Bacteria, UA Few None seen/Few Final  Urine culture     Status: Abnormal   Collection Time: 11/14/20  4:57 PM   Specimen: Urine, Random   Result Value Ref Range Status   Specimen Description URINE, RANDOM  Final   Special Requests   Final    NONE Performed at [facility], [address] [place], [place] [address]    Culture MULTIPLE SPECIES PRESENT, SUGGEST RECOLLECTION (A)  Final   Report Status 11/16/2020 FINAL  Final  Resp Panel by RT-PCR (Flu A&B, Covid) Nasopharyngeal Swab     Status: Abnormal   Collection Time: 11/19/20  3:40 PM   Specimen: Nasopharyngeal Swab; Nasopharyngeal(NP) swabs in vial transport medium  Result Value Ref Range Status   SARS Coronavirus 2 by RT PCR POSITIVE (A) NEGATIVE Final    Comment: RESULT CALLED TO, READ BACK BY AND VERIFIED WITH: Corlis Leak RN 11/19/20 AT 1805 SK  (NOTE) SARS-CoV-2 target nucleic acids are DETECTED.  The SARS-CoV-2 RNA is generally detectable in upper respiratory specimens during the acute phase of infection. Positive results are indicative of the presence of the identified virus, but do not rule out bacterial infection or co-infection with other pathogens not detected by the test. Clinical correlation with patient history and other diagnostic information is necessary to determine patient infection status. The expected result is Negative.  Fact Sheet for Patients: EntrepreneurPulse.com.au  Fact Sheet for Healthcare Providers: IncredibleEmployment.be  This test is not yet approved or cleared by the [place] FDA and  has been authorized for detection and/or diagnosis of SARS-CoV-2 by FDA under an Emergency Use Authorization (EUA).  This EUA will remain in effect (meaning this test can be  used) for the duration of  the COVID-19 declaration under Section 564(b)(1) of the Act, 21 U.S.C. section 360bbb-3(b)(1), unless the authorization is terminated or revoked sooner.     Influenza A by PCR NEGATIVE NEGATIVE Final   Influenza B by PCR NEGATIVE NEGATIVE Final    Comment: (NOTE) The Xpert Xpress SARS-CoV-2/FLU/RSV  plus assay is intended as an aid in the diagnosis of influenza from Nasopharyngeal swab specimens and should not be used as a sole basis for treatment. Nasal washings and aspirates are unacceptable for Xpert Xpress SARS-CoV-2/FLU/RSV testing.  Fact Sheet for Patients: EntrepreneurPulse.com.au  Fact Sheet for Healthcare Providers: IncredibleEmployment.be  This test is not yet approved or cleared by the [place] FDA and has been authorized for detection and/or diagnosis of SARS-CoV-2 by FDA under an Emergency Use Authorization (EUA).  This EUA will remain in effect (meaning this test can be used) for the duration of the COVID-19 declaration under Section 564(b)(1) of the Act, 21 U.S.C. section 360bbb-3(b)(1), unless the authorization is terminated or revoked.  Performed at [facility], [place] [address], [place], [place] [address]   Blood Culture (routine x 2)     Status: None (Preliminary result)   Collection Time: 11/19/20  4:37 PM   Specimen: BLOOD  Result Value Ref Range Status   Specimen Description BLOOD SITE NOT SPECIFIED  Final   Special Requests   Final    BOTTLES DRAWN AEROBIC AND ANAEROBIC Blood Culture adequate volume   Culture   Final    NO GROWTH 3 DAYS Performed at [facility], [address], [place], [place] [address]    Report Status PENDING  Incomplete  Culture, sputum-assessment     Status: None   Collection Time: 11/19/20  7:17 PM   Specimen: Expectorated Sputum  Result Value Ref Range Status   Specimen Description EXPECTORATED SPUTUM  Final   Special Requests NONE  Final   Sputum evaluation   Final    THIS SPECIMEN IS ACCEPTABLE FOR SPUTUM CULTURE Performed at [facility], [place] [address], [place], [place] [address]    Report Status 11/20/2020 FINAL  Final  Culture, respiratory     Status: None   Collection Time: 11/19/20  7:17 PM  Result Value Ref Range Status   Specimen Description  EXPECTORATED SPUTUM  Final   Special Requests NONE Reflexed from [id number]  Final   Gram Stain   Final    MODERATE WBC PRESENT, PREDOMINANTLY PMN ABUNDANT GRAM POSITIVE COCCI IN PAIRS IN CLUSTERS Performed at [facility], [place] [address], [place], [place] [address]    Culture   Final    MODERATE METHICILLIN RESISTANT STAPHYLOCOCCUS AUREUS   Report Status 11/22/2020 FINAL  Final   Organism ID, Bacteria METHICILLIN RESISTANT STAPHYLOCOCCUS AUREUS  Final      Susceptibility   Methicillin resistant staphylococcus aureus - MIC*    CIPROFLOXACIN >=8 RESISTANT Resistant     ERYTHROMYCIN >=8 RESISTANT Resistant     GENTAMICIN <=0.5 SENSITIVE Sensitive     OXACILLIN >=4 RESISTANT Resistant     TETRACYCLINE <=1 SENSITIVE Sensitive     VANCOMYCIN <=0.5 SENSITIVE Sensitive     TRIMETH/SULFA <=10 SENSITIVE Sensitive     CLINDAMYCIN <=0.25 SENSITIVE Sensitive     RIFAMPIN <=0.5 SENSITIVE Sensitive     Inducible Clindamycin NEGATIVE Sensitive     * MODERATE METHICILLIN RESISTANT STAPHYLOCOCCUS AUREUS  Blood Culture (routine x 2)     Status: None (Preliminary result)   Collection Time: 11/19/20  9:10 PM   Specimen: BLOOD LEFT WRIST  Result Value Ref Range Status   Specimen Description BLOOD LEFT WRIST  Final   Special Requests   Final    BOTTLES DRAWN AEROBIC AND ANAEROBIC Blood Culture adequate volume   Culture   Final    NO GROWTH 3 DAYS Performed at [facility], [address] [place], [place] [address]    Report Status PENDING  Incomplete  MRSA PCR Screening     Status: Abnormal   Collection Time: 11/20/20  6:58 AM   Specimen: Nasopharyngeal  Result Value Ref Range Status   MRSA by PCR POSITIVE (A) NEGATIVE Final    Comment:        The GeneXpert MRSA Assay (FDA approved for NASAL specimens only), is one component  of a comprehensive MRSA colonization surveillance program. It is not intended to diagnose MRSA infection nor to guide or monitor treatment for MRSA  infections. RESULT CALLED TO, READ BACK BY AND VERIFIED WITH: Lieutenant Diego RN 12:25 11/20/20 ([email or web address]) Performed at [facility], [place] [address] [place], [place] [address]     Today   Subjective    Aayden Cefalu today has no headache,no chest abdominal pain,no new weakness tingling or numbness, feels much better wants to go home today.   Objective   Blood pressure 128/75, pulse 95, temperature (!) 97.2 F (36.2 C), temperature source Oral, resp. rate 18, SpO2 98 %.   Intake/Output Summary (Last 24 hours) at 11/23/2020 0948 Last data filed at 11/23/2020 4888 Gross per 24 hour  Intake 884.31 ml  Output 700 ml  Net 184.31 ml    Exam  Awake mildly confused, No new F.N deficits, Normal affect [place].AT,PERRAL Supple Neck,No JVD, No cervical lymphadenopathy appriciated.  Symmetrical Chest wall movement, Good air movement bilaterally, CTAB RRR,No Gallops,Rubs or new Murmurs, No Parasternal Heave +ve B.Sounds, Abd Soft, Non tender, No organomegaly appriciated, No rebound -guarding or rigidity. No Cyanosis, Clubbing or edema, No new Rash or bruise   Data Review   CBC w Diff:  Lab Results  Component Value Date   WBC 8.3 11/23/2020   HGB 10.3 (L) 11/23/2020   HCT 30.9 (L) 11/23/2020   PLT 204 11/23/2020   LYMPHOPCT 3 11/23/2020   BANDSPCT 0 08/01/2010   MONOPCT 3 11/23/2020   EOSPCT 0 11/23/2020   BASOPCT 0 11/23/2020    CMP:  Lab Results  Component Value Date   NA 130 (L) 11/23/2020   K 4.3 11/23/2020   CL 103 11/23/2020   CO2 16 (L) 11/23/2020   BUN 61 (H) 11/23/2020   CREATININE 1.48 (H) 11/23/2020   PROT 5.4 (L) 11/23/2020   ALBUMIN 2.5 (L) 11/23/2020   BILITOT 0.8 11/23/2020   ALKPHOS 76 11/23/2020   AST 15 11/23/2020   ALT 21 11/23/2020  .   Total Time in preparing paper work, data evaluation and todays exam - 81 minutes  Lala Lund M.D on 11/23/2020 at 9:48 AM  [facility]

## 2020-11-23 NOTE — Progress Notes (Signed)
Alanda Slim to be D/C'd Home per MD order.  Discussed with the patient and all questions fully answered.  VSS, Skin clean, dry and intact without evidence of skin break down, no evidence of skin tears noted. IV catheter discontinued intact. Site without signs and symptoms of complications. Dressing and pressure applied.  An After Visit Summary was printed and given to the patient.   D/c education completed with patient/family including follow up instructions, medication list, d/c activities limitations if indicated, with other d/c instructions as indicated by MD - patient able to verbalize understanding, all questions fully answered.   Patient instructed to return to ED, call 911, or call MD for any changes in condition.   Patient escorted via [place], and D/C home via private auto.  Dene Gentry 11/23/2020 1:53 PM

## 2020-11-23 NOTE — TOC Initial Note (Addendum)
Transition of Care [facility]) - Initial/Assessment Note    Patient Details  Name: Tony Holt MRN: [id number] Date of Birth: 09-07-1926  Transition of Care [facility]) CM/SW Contact:    Carles Collet, RN Phone Number: 11/23/2020, 8:50 AM  Clinical Narrative:                 08:50 LVM requesting call back with daughter. Patient will need [place] services set up and possible DME.  09:20 No answer from daughter, called again, wen to VM. Called son, he states that he will call sister to have her return my call.    Cardwell,Penny Daughter   [phone]  [place], [place] [phone]  [phone]      Expected Discharge Plan: [place] Barriers to Discharge: Continued Medical Work up   Patient Goals and CMS Choice        Expected Discharge Plan and Services Expected Discharge Plan: [place]   Discharge Planning Services: CM Consult     Expected Discharge Date: 11/23/20                                    Prior Living Arrangements/Services   Lives with:: Adult Children                   Activities of Daily Living      Permission Sought/Granted                  Emotional Assessment              Admission diagnosis:  AKI (acute kidney injury) ([place]) [N17.9] COVID-19 virus infection [U07.1] Pneumonia due to COVID-19 virus [U07.1, J12.82] COVID-19 [U07.1] Patient Active Problem List   Diagnosis Date Noted  . COVID-19 virus infection 11/19/2020  . GERD without esophagitis 11/19/2020  . Acute metabolic encephalopathy 24/23/5361  . Protein-calorie malnutrition ([place]) 11/19/2020  . BPH without obstruction/lower urinary tract symptoms 10/15/2020  . Nocturia 10/15/2020  . Pelvis fracture, right, closed, initial encounter ([place]) 10/01/2020  . Lobar pneumonia ([place]) 09/03/2020  . Acute respiratory failure with hypoxia ([place]) 09/03/2020  . Acute renal failure superimposed on stage 3a chronic kidney disease ([place])  09/03/2020  . Respiratory failure with hypoxia ([place]) 09/02/2020  . Pneumonia of both lungs due to infectious organism   . Elevated PSA 04/28/2020  . AKI (acute kidney injury) ([place]) 02/23/2020  . Prostate cancer ([place]) 02/22/2020  . Atrial fibrillation, chronic ([place]) 02/22/2020  . Cecal cancer ([place])   . Iron deficiency anemia due to chronic blood loss 05/17/2019  . Constipation 05/17/2019  . Bradycardia 05/28/2016  . Weakness 05/28/2016  . Acute renal failure ([place]) 05/28/2016  . Hyperkalemia 05/28/2016  . Nausea alone 03/24/2012  . Abdominal pain, epigastric 03/24/2012  . Essential hypertension 03/24/2012  . ANEMIA 09/16/2010  . HEMOCCULT POSITIVE STOOL 09/16/2010   PCP:  [name], MD Pharmacy:   [id number] - [place], [address] [place] [address] Phone: [phone] Fax: [phone]     Social Determinants of Health (SDOH) Interventions    Readmission Risk Interventions Readmission Risk Prevention Plan 02/07/2020  Transportation Screening Complete  PCP or Specialist Appt within 3-5 Days Not Complete  Not Complete comments office not accepting voicemails. states they will reopen tomorrow at 10.  [place] or Home Care Consult Complete  Social Work Consult for Recovery Care  Planning/Counseling Complete  Palliative Care Screening Not Applicable  Medication Review (RN Care Manager) Complete  Some recent data might be hidden

## 2020-11-24 LAB — CULTURE, BLOOD (ROUTINE X 2)
Culture: NO GROWTH
Culture: NO GROWTH
Special Requests: ADEQUATE
Special Requests: ADEQUATE

## 2020-11-28 DIAGNOSIS — H6121 Impacted cerumen, right ear: Secondary | ICD-10-CM | POA: Diagnosis not present

## 2020-11-28 DIAGNOSIS — H9113 Presbycusis, bilateral: Secondary | ICD-10-CM | POA: Diagnosis not present

## 2020-11-28 DIAGNOSIS — J1282 Pneumonia due to coronavirus disease 2019: Secondary | ICD-10-CM | POA: Diagnosis not present

## 2020-11-28 DIAGNOSIS — G4700 Insomnia, unspecified: Secondary | ICD-10-CM | POA: Diagnosis not present

## 2020-12-01 ENCOUNTER — Other Ambulatory Visit (HOSPITAL_COMMUNITY)
Admission: RE | Admit: 2020-12-01 | Discharge: 2020-12-01 | Disposition: A | Discharge status: Home / Self Care | Payer: Medicare HMO | Source: Ambulatory Visit | Attending: Family Medicine | Admitting: Family Medicine

## 2020-12-01 ENCOUNTER — Other Ambulatory Visit (HOSPITAL_COMMUNITY): Payer: Self-pay | Admitting: Nurse Practitioner

## 2020-12-01 ENCOUNTER — Ambulatory Visit (HOSPITAL_COMMUNITY)
Admission: RE | Admit: 2020-12-01 | Discharge: 2020-12-01 | Disposition: A | Discharge status: Home / Self Care | Payer: Medicare HMO | Source: Ambulatory Visit | Attending: Nurse Practitioner | Admitting: Nurse Practitioner

## 2020-12-01 ENCOUNTER — Other Ambulatory Visit: Payer: Self-pay

## 2020-12-01 DIAGNOSIS — U071 COVID-19: Secondary | ICD-10-CM

## 2020-12-01 DIAGNOSIS — R0602 Shortness of breath: Secondary | ICD-10-CM | POA: Diagnosis not present

## 2020-12-01 DIAGNOSIS — J189 Pneumonia, unspecified organism: Secondary | ICD-10-CM | POA: Insufficient documentation

## 2020-12-01 LAB — COMPREHENSIVE METABOLIC PANEL
ALT: 27 U/L (ref 0–44)
AST: 21 U/L (ref 15–41)
Albumin: 3.4 g/dL — ABNORMAL LOW (ref 3.5–5.0)
Alkaline Phosphatase: 74 U/L (ref 38–126)
Anion gap: 9 (ref 5–15)
BUN: 25 mg/dL — ABNORMAL HIGH (ref 8–23)
CO2: 22 mmol/L (ref 22–32)
Calcium: 8.9 mg/dL (ref 8.9–10.3)
Chloride: 102 mmol/L (ref 98–111)
Creatinine, Ser: 1.02 mg/dL (ref 0.61–1.24)
GFR, Estimated: >60 mL/min (ref >60)
Glucose, Bld: 104 mg/dL — ABNORMAL HIGH (ref 70–99)
Potassium: 4.8 mmol/L (ref 3.5–5.1)
Sodium: 133 mmol/L — ABNORMAL LOW (ref 135–145)
Total Bilirubin: 0.7 mg/dL (ref 0.3–1.2)
Total Protein: 6.5 g/dL (ref 6.5–8.1)

## 2020-12-01 LAB — CBC
HCT: 39.2 % (ref 39.0–52.0)
Hemoglobin: 11.3 g/dL — ABNORMAL LOW (ref 13.0–17.0)
MCH: 31 pg (ref 26.0–34.0)
MCHC: 28.8 g/dL — ABNORMAL LOW (ref 30.0–36.0)
MCV: 107.4 fL — ABNORMAL HIGH (ref 80.0–100.0)
Platelets: 246 10*3/uL (ref 150–400)
RBC: 3.65 MIL/uL — ABNORMAL LOW (ref 4.22–5.81)
RDW: 14.6 % (ref 11.5–15.5)
WBC: 12.3 10*3/uL — ABNORMAL HIGH (ref 4.0–10.5)
nRBC: 0 % (ref 0.0–0.2)

## 2020-12-03 DIAGNOSIS — H6123 Impacted cerumen, bilateral: Secondary | ICD-10-CM | POA: Diagnosis not present

## 2020-12-03 DIAGNOSIS — B9562 Methicillin resistant Staphylococcus aureus infection as the cause of diseases classified elsewhere: Secondary | ICD-10-CM | POA: Diagnosis not present

## 2020-12-03 DIAGNOSIS — J180 Bronchopneumonia, unspecified organism: Secondary | ICD-10-CM | POA: Diagnosis not present

## 2020-12-03 DIAGNOSIS — Z8616 Personal history of COVID-19: Secondary | ICD-10-CM | POA: Diagnosis not present

## 2021-01-05 DIAGNOSIS — Z8616 Personal history of COVID-19: Secondary | ICD-10-CM | POA: Diagnosis not present

## 2021-01-05 DIAGNOSIS — M549 Dorsalgia, unspecified: Secondary | ICD-10-CM | POA: Diagnosis not present

## 2021-01-05 DIAGNOSIS — H6121 Impacted cerumen, right ear: Secondary | ICD-10-CM | POA: Diagnosis not present

## 2021-01-05 DIAGNOSIS — Z79891 Long term (current) use of opiate analgesic: Secondary | ICD-10-CM | POA: Diagnosis not present

## 2021-01-09 ENCOUNTER — Other Ambulatory Visit: Payer: Medicare HMO

## 2021-01-09 ENCOUNTER — Other Ambulatory Visit: Payer: Self-pay

## 2021-01-09 DIAGNOSIS — C61 Malignant neoplasm of prostate: Secondary | ICD-10-CM | POA: Diagnosis not present

## 2021-01-09 DIAGNOSIS — N138 Other obstructive and reflux uropathy: Secondary | ICD-10-CM

## 2021-01-09 DIAGNOSIS — N401 Enlarged prostate with lower urinary tract symptoms: Secondary | ICD-10-CM | POA: Diagnosis not present

## 2021-01-10 LAB — PSA: Prostate Specific Ag, Serum: <0.1 ng/mL (ref 0.0–4.0)

## 2021-01-15 NOTE — Progress Notes (Signed)
Sent via [employer], and mail.

## 2021-01-16 ENCOUNTER — Encounter: Payer: Self-pay | Admitting: Urology

## 2021-01-16 ENCOUNTER — Ambulatory Visit (INDEPENDENT_AMBULATORY_CARE_PROVIDER_SITE_OTHER): Payer: Medicare HMO | Admitting: Urology

## 2021-01-16 ENCOUNTER — Other Ambulatory Visit: Payer: Self-pay

## 2021-01-16 ENCOUNTER — Inpatient Hospital Stay (HOSPITAL_COMMUNITY): Payer: Medicare HMO | Attending: Hematology

## 2021-01-16 VITALS — BP 156/61 | HR 87 | Temp 97.5°F | Ht 68.0 in | Wt 128.0 lb

## 2021-01-16 DIAGNOSIS — Z833 Family history of diabetes mellitus: Secondary | ICD-10-CM | POA: Insufficient documentation

## 2021-01-16 DIAGNOSIS — Z8249 Family history of ischemic heart disease and other diseases of the circulatory system: Secondary | ICD-10-CM | POA: Insufficient documentation

## 2021-01-16 DIAGNOSIS — Z9049 Acquired absence of other specified parts of digestive tract: Secondary | ICD-10-CM | POA: Diagnosis not present

## 2021-01-16 DIAGNOSIS — D631 Anemia in chronic kidney disease: Secondary | ICD-10-CM | POA: Insufficient documentation

## 2021-01-16 DIAGNOSIS — N281 Cyst of kidney, acquired: Secondary | ICD-10-CM | POA: Diagnosis not present

## 2021-01-16 DIAGNOSIS — M549 Dorsalgia, unspecified: Secondary | ICD-10-CM | POA: Diagnosis not present

## 2021-01-16 DIAGNOSIS — G479 Sleep disorder, unspecified: Secondary | ICD-10-CM | POA: Insufficient documentation

## 2021-01-16 DIAGNOSIS — N400 Enlarged prostate without lower urinary tract symptoms: Secondary | ICD-10-CM | POA: Insufficient documentation

## 2021-01-16 DIAGNOSIS — C61 Malignant neoplasm of prostate: Secondary | ICD-10-CM | POA: Insufficient documentation

## 2021-01-16 DIAGNOSIS — N138 Other obstructive and reflux uropathy: Secondary | ICD-10-CM | POA: Diagnosis not present

## 2021-01-16 DIAGNOSIS — C180 Malignant neoplasm of cecum: Secondary | ICD-10-CM | POA: Diagnosis not present

## 2021-01-16 DIAGNOSIS — I4891 Unspecified atrial fibrillation: Secondary | ICD-10-CM | POA: Diagnosis not present

## 2021-01-16 DIAGNOSIS — E611 Iron deficiency: Secondary | ICD-10-CM | POA: Insufficient documentation

## 2021-01-16 DIAGNOSIS — R5383 Other fatigue: Secondary | ICD-10-CM | POA: Insufficient documentation

## 2021-01-16 DIAGNOSIS — Z87891 Personal history of nicotine dependence: Secondary | ICD-10-CM | POA: Insufficient documentation

## 2021-01-16 DIAGNOSIS — N189 Chronic kidney disease, unspecified: Secondary | ICD-10-CM | POA: Insufficient documentation

## 2021-01-16 DIAGNOSIS — Z85038 Personal history of other malignant neoplasm of large intestine: Secondary | ICD-10-CM | POA: Diagnosis not present

## 2021-01-16 DIAGNOSIS — R351 Nocturia: Secondary | ICD-10-CM

## 2021-01-16 DIAGNOSIS — N401 Enlarged prostate with lower urinary tract symptoms: Secondary | ICD-10-CM | POA: Diagnosis not present

## 2021-01-16 DIAGNOSIS — Z79899 Other long term (current) drug therapy: Secondary | ICD-10-CM | POA: Insufficient documentation

## 2021-01-16 DIAGNOSIS — D509 Iron deficiency anemia, unspecified: Secondary | ICD-10-CM

## 2021-01-16 DIAGNOSIS — K219 Gastro-esophageal reflux disease without esophagitis: Secondary | ICD-10-CM | POA: Diagnosis not present

## 2021-01-16 DIAGNOSIS — R531 Weakness: Secondary | ICD-10-CM | POA: Insufficient documentation

## 2021-01-16 LAB — CBC WITH DIFFERENTIAL/PLATELET
Abs Immature Granulocytes: 0.04 10*3/uL (ref 0.00–0.07)
Basophils Absolute: 0 10*3/uL (ref 0.0–0.1)
Basophils Relative: 1 %
Eosinophils Absolute: 0.2 10*3/uL (ref 0.0–0.5)
Eosinophils Relative: 3 %
HCT: 37.4 % — ABNORMAL LOW (ref 39.0–52.0)
Hemoglobin: 11.2 g/dL — ABNORMAL LOW (ref 13.0–17.0)
Immature Granulocytes: 1 %
Lymphocytes Relative: 9 %
Lymphs Abs: 0.6 10*3/uL — ABNORMAL LOW (ref 0.7–4.0)
MCH: 29.4 pg (ref 26.0–34.0)
MCHC: 29.9 g/dL — ABNORMAL LOW (ref 30.0–36.0)
MCV: 98.2 fL (ref 80.0–100.0)
Monocytes Absolute: 0.5 10*3/uL (ref 0.1–1.0)
Monocytes Relative: 8 %
Neutro Abs: 5 10*3/uL (ref 1.7–7.7)
Neutrophils Relative %: 78 %
Platelets: 91 10*3/uL — ABNORMAL LOW (ref 150–400)
RBC: 3.81 MIL/uL — ABNORMAL LOW (ref 4.22–5.81)
RDW: 14.8 % (ref 11.5–15.5)
WBC: 6.4 10*3/uL (ref 4.0–10.5)
nRBC: 0 % (ref 0.0–0.2)

## 2021-01-16 LAB — COMPREHENSIVE METABOLIC PANEL
ALT: 16 U/L (ref 0–44)
AST: 19 U/L (ref 15–41)
Albumin: 3.7 g/dL (ref 3.5–5.0)
Alkaline Phosphatase: 56 U/L (ref 38–126)
Anion gap: 7 (ref 5–15)
BUN: 33 mg/dL — ABNORMAL HIGH (ref 8–23)
CO2: 22 mmol/L (ref 22–32)
Calcium: 9.6 mg/dL (ref 8.9–10.3)
Chloride: 109 mmol/L (ref 98–111)
Creatinine, Ser: 1.29 mg/dL — ABNORMAL HIGH (ref 0.61–1.24)
GFR, Estimated: 52 mL/min — ABNORMAL LOW (ref >60)
Glucose, Bld: 115 mg/dL — ABNORMAL HIGH (ref 70–99)
Potassium: 4.2 mmol/L (ref 3.5–5.1)
Sodium: 138 mmol/L (ref 135–145)
Total Bilirubin: 0.7 mg/dL (ref 0.3–1.2)
Total Protein: 6.6 g/dL (ref 6.5–8.1)

## 2021-01-16 LAB — BLADDER SCAN AMB NON-IMAGING: Scan Result: 46

## 2021-01-16 LAB — URINALYSIS, ROUTINE W REFLEX MICROSCOPIC
Bilirubin, UA: NEGATIVE
Glucose, UA: NEGATIVE
Ketones, UA: NEGATIVE
Nitrite, UA: NEGATIVE
Protein,UA: NEGATIVE
Specific Gravity, UA: 1.02 (ref 1.005–1.030)
Urobilinogen, Ur: 0.2 mg/dL (ref 0.2–1.0)
pH, UA: 5 (ref 5.0–7.5)

## 2021-01-16 LAB — PSA: Prostatic Specific Antigen: 0.04 ng/mL (ref 0.00–4.00)

## 2021-01-16 LAB — MICROSCOPIC EXAMINATION: Renal Epithel, UA: NONE SEEN /hpf

## 2021-01-16 MED ORDER — TAMSULOSIN HCL 0.4 MG PO CAPS
0.4000 mg | ORAL_CAPSULE | Freq: Two times a day (BID) | ORAL | 11 refills | Status: AC
Start: 2021-01-16 — End: ?

## 2021-01-16 MED ORDER — MIRABEGRON ER 50 MG PO TB24: 50.0000 mg | ORAL_TABLET | Freq: Every day | ORAL | 11 refills | Status: AC

## 2021-01-16 NOTE — Progress Notes (Signed)
Urological Symptom Review  Patient is experiencing the following symptoms: Frequent urination Hard to postpone urination Get up at night to urinate   Review of Systems  Gastrointestinal (upper)  : Negative for upper GI symptoms  Gastrointestinal (lower) : Negative for lower GI symptoms  Constitutional : Negative for symptoms  Skin: Negative for skin symptoms  Eyes: Blurred vision  Ear/Nose/Throat : Negative for Ear/Nose/Throat symptoms  Hematologic/Lymphatic: Negative for Hematologic/Lymphatic symptoms  Cardiovascular : Negative for cardiovascular symptoms  Respiratory : Negative for respiratory symptoms  Endocrine: Negative for endocrine symptoms  Musculoskeletal: Negative for musculoskeletal symptoms  Neurological: Negative for neurological symptoms  Psychologic: Negative for psychiatric symptoms  

## 2021-01-16 NOTE — Patient Instructions (Signed)
Prostate Cancer  The prostate is a male gland that helps make semen. It is located below a man's bladder, in front of the rectum. Prostate cancer is when abnormal cells grow in this gland. What are the causes? The cause of this condition is not known. What increases the risk? You are more likely to develop this condition if:  You are 85 years of age or older.  You are African American.  You have a family history of prostate cancer.  You have a family history of breast cancer. What are the signs or symptoms? Symptoms of this condition include:  A need to pee often.  Peeing that is weak, or pee that stops and starts.  Trouble starting or stopping your pee.  Inability to pee.  Blood in your pee or semen.  Pain in the lower back, lower belly (abdomen), hips, or upper thighs.  Trouble getting an erection.  Trouble emptying all of your pee. How is this treated? Treatment for this condition depends on your age, your health, the kind of treatment you like, and how far the cancer has spread. Treatments include:  Being watched. This is called observation. You will be tested from time to time, but you will not get treated. Tests are to make sure that the cancer is not growing.  Surgery. This may be done to remove the prostate, to remove the testicles, or to freeze or kill cancer cells.  Radiation. This uses a strong beam to kill cancer cells.  Ultrasound energy. This uses strong sound waves to kill cancer cells.  Chemotherapy. This uses medicines that stop cancer cells from increasing. This kills cancer cells and healthy cells.  Targeted therapy. This kills cancer cells only. Healthy cells are not affected.  Hormone treatment. This stops the body from making hormones that help the cancer cells to grow. Follow these instructions at home:  Take over-the-counter and prescription medicines only as told by your doctor.  Eat a healthy diet.  Get plenty of sleep.  Ask your  doctor for help to find a support group for men with prostate cancer.  If you have to go to the hospital, let your cancer doctor (oncologist) know.  Treatment may affect your ability to have sex. Touch, hold, hug, and caress your partner to have intimate moments.  Keep all follow-up visits as told by your doctor. This is important. Contact a doctor if:  You have new or more trouble peeing.  You have new or more blood in your pee.  You have new or more pain in your hips, back, or chest. Get help right away if:  You have weakness in your legs.  You lose feeling in your legs.  You cannot control your pee or your poop (stool).  You have chills or a fever. Summary  The prostate is a male gland that helps make semen.  Prostate cancer is when abnormal cells grow in this gland.  Treatment includes doing surgery, using medicines, using very strong beams, or watching without treatment.  Ask your doctor for help to find a support group for men with prostate cancer.  Contact a doctor if you have problems peeing or have any new pain that you did not have before. This information is not intended to replace advice given to you by your health care provider. Make sure you discuss any questions you have with your health care provider. Document Revised: 11/13/2019 Document Reviewed: 11/13/2019 Elsevier Patient Education  2021 Elsevier Inc.  

## 2021-01-16 NOTE — Progress Notes (Signed)
01/16/2021 9:38 AM   Tony Holt [birth year] [id number]  Referring provider: [name], MD [address] [place],  [place] [address]  Followup prostate cancer and BPH  HPI: Tony Holt is a 87yo here for followup for BPH and prostate cancer. PSA undetectable. He is due for ADT next week. No hot flashes. Mild Fatigue. He has mild LUTS on flomax BID and mirabegron 50mg  daily. Nocturia 1-2x.  Stream strong, no urgency or frequency. Overall he is happy with his urination. No new complaints today.   PMH: Past Medical History:  Diagnosis Date  . BPH (benign prostatic hyperplasia)   . Colon cancer ([place]) 1999  . Dysrhythmia    new onset A Fib  . Essential hypertension   . GERD (gastroesophageal reflux disease)   . Hard of hearing   . Prostate cancer [facility])     Surgical History: Past Surgical History:  Procedure Laterality Date  . BIOPSY  12/28/2019   Procedure: BIOPSY;  Surgeon: Daneil Dolin, MD;  Location: [facility];  Service: Endoscopy;;  gastric, esophagus,cecal  . CATARACT EXTRACTION W/PHACO  01/18/2013   Procedure: CATARACT EXTRACTION PHACO AND INTRAOCULAR LENS PLACEMENT ([place]);  Surgeon: Tonny Branch, MD;  Location: [facility] ORS;  Service: Ophthalmology;  Laterality: Left;  CDE 17.11  . CATARACT EXTRACTION W/PHACO Right 02/05/2013   Procedure: CATARACT EXTRACTION PHACO AND INTRAOCULAR LENS PLACEMENT (IOC);  Surgeon: Tonny Branch, MD;  Location: [facility] ORS;  Service: Ophthalmology;  Laterality: Right;  CDE:19.49  . COLON SURGERY  1999   Sigmoid colon cancer, segmental resection by Drs. Smith/Bradford  . COLONOSCOPY  2002   Multiple adenomatous colon polyps  . COLONOSCOPY N/A 12/28/2019   Procedure: COLONOSCOPY;  Surgeon: Daneil Dolin, MD;  Location: [facility];  Service: Endoscopy;  Laterality: N/A;  . ESOPHAGOGASTRODUODENOSCOPY  03/25/2012   Dr. Gala Romney: Severe ulcerative/erosive reflux esophagitis.  Small hiatal hernia.  Abnormal gastric and duodenal mucosa, biopsy showed  reactive changes and minimal chronic inflammation but no H. pylori.  . ESOPHAGOGASTRODUODENOSCOPY N/A 12/28/2019   Procedure: ESOPHAGOGASTRODUODENOSCOPY (EGD);  Surgeon: Daneil Dolin, MD;  Location: [facility];  Service: Endoscopy;  Laterality: N/A;  . HERNIA REPAIR  02/2011   Right inguinal hernia repair with mesh, Dr. Geroge Baseman  . HERNIA REPAIR     three total  . PARTIAL COLECTOMY Right 02/04/2020   Procedure: RIGHT HEMI-COLECTOMY;  Surgeon: Virl Cagey, MD;  Location: [facility] ORS;  Service: General;  Laterality: Right;  . POLYPECTOMY  12/28/2019   Procedure: POLYPECTOMY;  Surgeon: Daneil Dolin, MD;  Location: [facility];  Service: Endoscopy;;  rectal     Home Medications:  Allergies as of 01/16/2021   No Known Allergies     Medication List       Accurate as of January 16, 2021  9:38 AM. If you have any questions, ask your nurse or doctor.        albuterol 108 (90 Base) MCG/ACT inhaler Commonly known as: VENTOLIN HFA Inhale 2 puffs into the lungs every 6 (six) hours as needed for wheezing or shortness of breath.   Aspirin 81 MG Caps Take 81 mg by mouth daily.   clonazePAM 0.5 MG tablet Commonly known as: KLONOPIN   CULTURELLE PROBIOTICS PO Take 1 tablet by mouth daily.   diphenhydramine-acetaminophen 25-500 MG Tabs tablet Commonly known as: TYLENOL PM Take 4 tablets by mouth at bedtime.   docusate sodium 100 MG capsule Commonly known as: COLACE Take 1 capsule (100  mg total) by mouth 2 (two) times daily as needed for mild constipation.   doxycycline 100 MG capsule Commonly known as: VIBRAMYCIN Take 1 capsule (100 mg total) by mouth 2 (two) times daily.   Ensure Take 237 mLs by mouth in the morning, at noon, and at bedtime.   furosemide 20 MG tablet Commonly known as: LASIX Take 20 mg by mouth daily as needed for fluid.   Gemtesa 75 MG Tabs Generic drug: Vibegron Take 1 capsule by mouth daily. What changed: how much to take    HYDROcodone-acetaminophen 7.5-325 MG tablet Commonly known as: NORCO Take 1 tablet by mouth in the morning, at noon, in the evening, and at bedtime.   methylPREDNISolone 4 MG Tbpk tablet Commonly known as: MEDROL DOSEPAK follow package directions   mirabegron ER 50 MG Tb24 tablet Commonly known as: MYRBETRIQ Take 1 tablet (50 mg total) by mouth daily.   multivitamin with minerals tablet Take 1 tablet by mouth daily.   omeprazole 20 MG capsule Commonly known as: PRILOSEC Take 20 mg by mouth 2 (two) times daily.   ondansetron 4 MG disintegrating tablet Commonly known as: ZOFRAN-ODT Take 4 mg by mouth daily as needed for nausea or vomiting.   tamsulosin 0.4 MG Caps capsule Commonly known as: FLOMAX Take 1 capsule (0.4 mg total) by mouth in the morning and at bedtime.       Allergies: No Known Allergies  Family History: Family History  Problem Relation Age of Onset  . Heart attack Father        Deceased, age 49s  . Diabetes Brother   . Colon cancer Neg Hx   . Liver disease Neg Hx   . Breast cancer Neg Hx   . Prostate cancer Neg Hx   . Pancreatic cancer Neg Hx     Social History:  reports that he quit smoking about 31 years ago. His smoking use included cigarettes. He has a 50.00 pack-year smoking history. His smokeless tobacco use includes snuff. He reports that he does not drink alcohol and does not use drugs.  ROS: All other review of systems were reviewed and are negative except what is noted above in HPI  Physical Exam: BP (!) 156/61   Pulse 87   Temp (!) 97.5 F (36.4 C)   Ht 5\' 8"  (1.727 m)   Wt 128 lb (58.1 kg)   BMI 19.46 kg/m   Constitutional:  Alert and oriented, No acute distress. HEENT: [place] AT, moist mucus membranes.  Trachea midline, no masses. Cardiovascular: No clubbing, cyanosis, or edema. Respiratory: Normal respiratory effort, no increased work of breathing. GI: Abdomen is soft, nontender, nondistended, no abdominal masses GU: No CVA  tenderness.  Lymph: No cervical or inguinal lymphadenopathy. Skin: No rashes, bruises or suspicious lesions. Neurologic: Grossly intact, no focal deficits, moving all 4 extremities. Psychiatric: Normal mood and affect.  Laboratory Data: Lab Results  Component Value Date   WBC 12.3 (H) 12/01/2020   HGB 11.3 (L) 12/01/2020   HCT 39.2 12/01/2020   MCV 107.4 (H) 12/01/2020   PLT 246 12/01/2020    Lab Results  Component Value Date   CREATININE 1.02 12/01/2020    No results found for: PSA  No results found for: TESTOSTERONE  Lab Results  Component Value Date   HGBA1C (H) 02/08/2011    5.7 (NOTE)  According to the ADA Clinical Practice Recommendations for 2011, when HbA1c is used as a screening test:   >=6.5%   Diagnostic of Diabetes Mellitus           (if abnormal result  is confirmed)  5.7-6.4%   Increased risk of developing Diabetes Mellitus  References:Diagnosis and Classification of Diabetes Mellitus,Diabetes ZOXW,9604,54(UJWJX 1):S62-S69 and Standards of Medical Care in         Diabetes - 2011,Diabetes BJYN,8295,62  (Suppl 1):S11-S61.    Urinalysis    Component Value Date/Time   COLORURINE YELLOW 11/20/2020 1104   APPEARANCEUR CLOUDY (A) 11/20/2020 1104   APPEARANCEUR Clear 11/13/2020 1136   LABSPEC 1.021 11/20/2020 1104   PHURINE 5.0 11/20/2020 1104   GLUCOSEU NEGATIVE 11/20/2020 1104   HGBUR NEGATIVE 11/20/2020 1104   BILIRUBINUR NEGATIVE 11/20/2020 1104   BILIRUBINUR Negative 11/13/2020 1136   KETONESUR 5 (A) 11/20/2020 1104   PROTEINUR 30 (A) 11/20/2020 1104   UROBILINOGEN negative (A) 04/07/2020 1426   UROBILINOGEN 0.2 03/23/2012 2041   NITRITE NEGATIVE 11/20/2020 1104   LEUKOCYTESUR NEGATIVE 11/20/2020 1104    Lab Results  Component Value Date   LABMICR See below: 11/13/2020   WBCUA 0-5 11/13/2020   LABEPIT 0-10 11/13/2020   MUCUS Present 11/13/2020   BACTERIA RARE (A) 11/20/2020     Pertinent Imaging:  Results for orders placed during the hospital encounter of 02/07/11  DG Abd 1 View  Narrative *RADIOLOGY REPORT*  Clinical Data: Abdominal pain.  ABDOMEN - 1 VIEW  Comparison: CT 02/07/2011.  Findings: Contrast from previous CT has progressed into the colon. There is contrast from the previous CT and urinary bladder.  There is no evidence of bowel obstruction.  No opaque calculi are seen. There is slight scoliosis convexity to the left.  IMPRESSION: No acute or active abdominal process is identified.  Original Report Authenticated By: Delane Ginger, M.D.  No results found for this or any previous visit.  No results found for this or any previous visit.  No results found for this or any previous visit.  Results for orders placed during the hospital encounter of 11/19/20  US RENAL  Narrative CLINICAL DATA:  Acute kidney injury  EXAM: RENAL / URINARY TRACT ULTRASOUND COMPLETE  COMPARISON:  CT 10/09/2020  FINDINGS: Right Kidney:  Renal measurements: 9.4 by 4.5 x 4.6 cm = volume: 101.8 mL. Echogenicity within normal limits. No hydronephrosis. Multiple cysts, fewer than 10. The largest cyst in the upper pole measures 7.9 x 4.5 x 6.5 cm. Lower pole cyst measures 4.3 x 3.2 x 4 cm.  Left Kidney:  Renal measurements: 9.8 x 4.7 x 4.4 cm = volume: 146.8 mL. Echogenicity within normal limits. No hydronephrosis. Cortical thinning at the poles. Multiple cysts, fewer than 10. The largest is seen in the lower pole and measures 3.9 x 3.3 x 2.9 cm.  Bladder:  Appears normal for degree of bladder distention.  Other:  None.  IMPRESSION: 1. Negative for hydronephrosis. 2. Mild cortical thinning at the poles of the left kidney suggesting mild atrophy. 3. Multiple simple cysts within the kidneys.   Electronically Signed By: Donavan Foil M.D. On: 11/20/2020 20:56  No results found for this or any previous visit.  No results found for this  or any previous visit.  No results found for this or any previous visit.   Assessment & Plan:    1. Prostate cancer ([place]) -RTC 6 months with PSA and eligard 45mg  -RTC next week for eligard 45mg  - Urinalysis,  Routine w reflex microscopic - Bladder Scan (Post Void Residual) in office  2. BPH with LUTS, nocturia -Continue flomax BID and mirabegron 50mg   No follow-ups on file.  Nicolette Bang, MD  [facility] [place]

## 2021-01-17 LAB — CEA: CEA: 2.8 ng/mL (ref 0.0–4.7)

## 2021-01-22 ENCOUNTER — Inpatient Hospital Stay (HOSPITAL_COMMUNITY): Payer: Medicare HMO | Admitting: Hematology

## 2021-01-22 ENCOUNTER — Other Ambulatory Visit: Payer: Self-pay

## 2021-01-22 VITALS — BP 89/46 | HR 68 | Temp 98.7°F | Resp 17 | Wt 126.2 lb

## 2021-01-22 DIAGNOSIS — D509 Iron deficiency anemia, unspecified: Secondary | ICD-10-CM | POA: Diagnosis not present

## 2021-01-22 DIAGNOSIS — M549 Dorsalgia, unspecified: Secondary | ICD-10-CM | POA: Diagnosis not present

## 2021-01-22 DIAGNOSIS — R5383 Other fatigue: Secondary | ICD-10-CM | POA: Diagnosis not present

## 2021-01-22 DIAGNOSIS — C180 Malignant neoplasm of cecum: Secondary | ICD-10-CM | POA: Diagnosis not present

## 2021-01-22 DIAGNOSIS — C61 Malignant neoplasm of prostate: Secondary | ICD-10-CM | POA: Diagnosis not present

## 2021-01-22 DIAGNOSIS — D631 Anemia in chronic kidney disease: Secondary | ICD-10-CM | POA: Diagnosis not present

## 2021-01-22 DIAGNOSIS — G479 Sleep disorder, unspecified: Secondary | ICD-10-CM | POA: Diagnosis not present

## 2021-01-22 DIAGNOSIS — E611 Iron deficiency: Secondary | ICD-10-CM | POA: Diagnosis not present

## 2021-01-22 DIAGNOSIS — N189 Chronic kidney disease, unspecified: Secondary | ICD-10-CM | POA: Diagnosis not present

## 2021-01-22 DIAGNOSIS — R531 Weakness: Secondary | ICD-10-CM | POA: Diagnosis not present

## 2021-01-22 NOTE — Patient Instructions (Addendum)
[place] at [facility] Discharge Instructions  You were seen today by Dr. Delton Coombes. He went over your recent results and scans. You will be scheduled to have a CT scan of your abdomen before your next visit. Drink plenty of water and Ensure/Boost three times daily to maintain your weight and energy levels. Dr. Delton Coombes will see you back in 3 months for labs and follow up.   Thank you for choosing [place] at [facility] to provide your oncology and hematology care.  To afford each patient quality time with our provider, please arrive at least 15 minutes before your scheduled appointment time.   If you have a lab appointment with the [facility] please come in thru the Main Entrance and check in at the main information desk  You need to re-schedule your appointment should you arrive 10 or more minutes late.  We strive to give you quality time with our providers, and arriving late affects you and other patients whose appointments are after yours.  Also, if you no show three or more times for appointments you may be dismissed from the clinic at the providers discretion.     Again, thank you for choosing [facility].  Our hope is that these requests will decrease the amount of time that you wait before being seen by our physicians.       _____________________________________________________________  Should you have questions after your visit to [facility], please contact our office at [phone] between the hours of 8:00 a.m. and 4:30 p.m.  Voicemails left after 4:00 p.m. will not be returned until the following business day.  For prescription refill requests, have your pharmacy contact our office and allow 72 hours.    Cancer Center Support Programs:   > Cancer Support Group  2nd Tuesday of the month 1pm-2pm, Journey Room

## 2021-01-22 NOTE — Progress Notes (Signed)
Tony Holt, [place] [address]   CLINIC:  Medical Oncology/Hematology  PCP:  Tony Andrea, MD [address] / [place] [address] [phone]   REASON FOR VISIT:  Follow-up for cecal cancer, prostate cancer and IDA  PRIOR THERAPY:  1. Colon resection in 1999. 2. Right hemicolectomy on 02/04/2020.  NGS Results: Not done  CURRENT THERAPY: Observation; intermittent Feraheme last on 03/12/2020  BRIEF ONCOLOGIC HISTORY:  Oncology History  Cecal cancer (Tony Holt)  02/04/2020 Initial Diagnosis   Cecal cancer (Tony Holt)   03/03/2020 Cancer Staging   Staging form: Colon and Rectum, AJCC 8th Edition - Clinical stage from 03/03/2020: Stage IIB (cT4a, cN0, cM0) - Signed by Tony Jack, MD on 03/03/2020     CANCER STAGING: Cancer Staging Cecal cancer [facility]) Staging form: Colon and Rectum, AJCC 8th Edition - Clinical stage from 03/03/2020: Stage IIB (cT4a, cN0, cM0) - Signed by Tony Jack, MD on 03/03/2020   INTERVAL HISTORY:  [name], a 87 y.o. male, returns for routine follow-up of his cecal cancer, prostate cancer and IDA. Tony Holt was last seen on 10/16/2020.   Today he is accompanied by his daughter and he reports feeling poorly. He was hospitalized on 11/19/2020 for COVID pneumonia and was hospitalized through 12/12. He has been weak and sleeping a lot the last couple of days, though he denies having dizziness or lightheadedness. He is taking Norco 7.5 mg for his back pain every 4 hours. He is not currently on any antibiotics. He is taking Flomax and Myrbetriq for his irritable bladder. His appetite is slightly decreased for the past several days.  He lives with his daughter. He is mainly sedentary at home and walks to the bathroom.  REVIEW OF SYSTEMS:  Review of Systems  Constitutional: Positive for appetite change (75%) and fatigue (depleted).  Musculoskeletal: Positive for back pain (on Norco).  Neurological:  Positive for extremity weakness. Negative for dizziness and light-headedness.  Psychiatric/Behavioral: Positive for sleep disturbance.  All other systems reviewed and are negative.   PAST MEDICAL/SURGICAL HISTORY:  Past Medical History:  Diagnosis Date  . BPH (benign prostatic hyperplasia)   . Colon cancer ([place]) 1999  . Dysrhythmia    new onset A Fib  . Essential hypertension   . GERD (gastroesophageal reflux disease)   . Hard of hearing   . Prostate cancer [facility])    Past Surgical History:  Procedure Laterality Date  . BIOPSY  12/28/2019   Procedure: BIOPSY;  Surgeon: Daneil Dolin, MD;  Location: [facility];  Service: Endoscopy;;  gastric, esophagus,cecal  . CATARACT EXTRACTION W/PHACO  01/18/2013   Procedure: CATARACT EXTRACTION PHACO AND INTRAOCULAR LENS PLACEMENT ([place]);  Surgeon: Tonny Branch, MD;  Location: [facility] OR[facility];  Service: Ophthalmology;  Laterality: Left;  CDE 17.11  . CATARACT EXTRACTION W/PHACO Right 02/05/2013   Procedure: CATARACT EXTRACTION PHACO AND INTRAOCULAR LENS PLACEMENT ([facility]);  Surgeon: Tonny Branch, MD;  Location: [facility] OR[facility];  Service: Ophthalmology;  Laterality: Right;  CDE:19.49  . COLON SURGERY  1999   Sigmoid colon cancer, segmental resection by Drs. Smith/Bradford  . COLONOSCOPY  2002   Multiple adenomatous colon polyps  . COLONOSCOPY N/A 12/28/2019   Procedure: COLONOSCOPY;  Surgeon: Daneil Dolin, MD;  Location: [facility];  Service: Endoscopy;  Laterality: N/A;  . ESOPHAGOGASTRODUODENOSCOPY  03/25/2012   Dr. Gala Romney: Severe ulcerative/erosive reflux esophagitis.  Small hiatal hernia.  Abnormal gastric and duodenal mucosa, biopsy showed reactive changes and minimal chronic  inflammation but no H. pylori.  . ESOPHAGOGASTRODUODENOSCOPY N/A 12/28/2019   Procedure: ESOPHAGOGASTRODUODENOSCOPY (EGD);  Surgeon: Daneil Dolin, MD;  Location: [facility];  Service: Endoscopy;  Laterality: N/A;  . HERNIA REPAIR  02/2011   Right inguinal hernia repair with mesh, Dr.  Geroge Baseman  . HERNIA REPAIR     three total  . PARTIAL COLECTOMY Right 02/04/2020   Procedure: RIGHT HEMI-COLECTOMY;  Surgeon: Virl Cagey, MD;  Location: [facility];  Service: General;  Laterality: Right;  . POLYPECTOMY  12/28/2019   Procedure: POLYPECTOMY;  Surgeon: Daneil Dolin, MD;  Location[facility];  Service: Endoscopy;;  rectal     SOCIAL HISTORY:  Social History   Socioeconomic History  . Marital status: Widowed    Spouse name: Not on file  . Number of children: 5  . Years of education: Not on file  . Highest education level: Not on file  Occupational History  . Occupation: Retired Neurosurgeon: RETIRED  Tobacco Use  . Smoking status: Former Smoker    Packs/day: 1.00    Years: 50.00    Pack years: 50.00    Types: Cigarettes    Quit date: 12/13/1989    Years since quitting: 31.1  . Smokeless tobacco: Current User    Types: Snuff  Vaping Use  . Vaping Use: Never used  Substance and Sexual Activity  . Alcohol use: No    Alcohol/week: 0.0 standard drinks    Comment: previous heavy drinker at age [age]  . Drug use: No  . Sexual activity: Not Currently  Other Topics Concern  . Not on file  Social History Narrative  . Not on file   Social Determinants of Health   Financial Resource Strain: Low Risk   . Difficulty of Paying Living Expenses: Not hard at all  Food Insecurity: No Food Insecurity  . Worried About Charity fundraiser in the Last Year: Never true  . Ran Out of Food in the Last Year: Never true  Transportation Needs: No Transportation Needs  . Lack of Transportation (Medical): No  . Lack of Transportation (Non-Medical): No  Physical Activity: Insufficiently Active  . Days of Exercise per Week: 3 days  . Minutes of Exercise per Session: 20 min  Stress: No Stress Concern Present  . Feeling of Stress : Only a little  Social Connections: Socially Isolated  . Frequency of Communication with Friends and Family: More than three times a  week  . Frequency of Social Gatherings with Friends and Family: More than three times a week  . Attends Religious Services: Never  . Active Member of Clubs or Organizations: No  . Attends Archivist Meetings: Never  . Marital Status: Widowed  Intimate Partner Violence: Not At Risk  . Fear of Current or Ex-Partner: No  . Emotionally Abused: No  . Physically Abused: No  . Sexually Abused: No    FAMILY HISTORY:  Family History  Problem Relation Age of Onset  . Heart attack Father        Deceased, age 9s  . Diabetes Brother   . Colon cancer Neg Hx   . Liver disease Neg Hx   . Breast cancer Neg Hx   . Prostate cancer Neg Hx   . Pancreatic cancer Neg Hx     CURRENT MEDICATIONS:  Current Outpatient Medications  Medication Sig Dispense Refill  . albuterol (VENTOLIN HFA) 108 (90 Base) MCG/ACT inhaler Inhale 2 puffs into the  lungs every 6 (six) hours as needed for wheezing or shortness of breath. 6.7 g 0  . Aspirin 81 MG CAPS Take 81 mg by mouth daily.    . clonazePAM (KLONOPIN) 0.5 MG tablet     . diphenhydramine-acetaminophen (TYLENOL PM) 25-500 MG TABS tablet Take 4 tablets by mouth at bedtime.    . docusate sodium (COLACE) 100 MG capsule Take 1 capsule (100 mg total) by mouth 2 (two) times daily as needed for mild constipation. 60 capsule 1  . doxycycline (VIBRAMYCIN) 100 MG capsule Take 1 capsule (100 mg total) by mouth 2 (two) times daily. 12 capsule 0  . Ensure (ENSURE) Take 237 mLs by mouth in the morning, at noon, and at bedtime.     . furosemide (LASIX) 20 MG tablet Take 20 mg by mouth daily as needed for fluid.     Marland Kitchen HYDROcodone-acetaminophen (NORCO) 7.5-325 MG tablet Take 1 tablet by mouth in the morning, at noon, in the evening, and at bedtime.     . methylPREDNISolone (MEDROL DOSEPAK) 4 MG TBPK tablet follow package directions 21 tablet 0  . mirabegron ER (MYRBETRIQ) 50 MG TB24 tablet Take 1 tablet (50 mg total) by mouth daily. 30 tablet 11  . Multiple  Vitamins-Minerals (MULTIVITAMIN WITH MINERALS) tablet Take 1 tablet by mouth daily.    Marland Kitchen omeprazole (PRILOSEC) 20 MG capsule Take 20 mg by mouth 2 (two) times daily.  11  . ondansetron (ZOFRAN-ODT) 4 MG disintegrating tablet Take 4 mg by mouth daily as needed for nausea or vomiting.     . Probiotic Product (CULTURELLE PROBIOTICS PO) Take 1 tablet by mouth daily.     . tamsulosin (FLOMAX) 0.4 MG CAPS capsule Take 1 capsule (0.4 mg total) by mouth in the morning and at bedtime. 60 capsule 11  . Vibegron (GEMTESA) 75 MG TABS Take 1 capsule by mouth daily. (Patient taking differently: Take 75 mg by mouth daily.) 30 tablet 0   No current facility-administered medications for this visit.    ALLERGIES:  No Known Allergies  PHYSICAL EXAM:  Performance status (ECOG): 2 - Symptomatic, <50% confined to bed  Vitals:   01/22/21 1202  BP: (!) 89/46  Pulse: 68  Resp: 17  Temp: 98.7 F (37.1 C)  SpO2: 96%   Wt Readings from Last 3 Encounters:  01/22/21 126 lb 4 oz (57.3 kg)  01/16/21 128 lb (58.1 kg)  11/13/20 130 lb (59 kg)   Physical Exam Vitals reviewed.  Constitutional:      Appearance: Normal appearance.     Comments: In wheelchair  Cardiovascular:     Rate and Rhythm: Normal rate and regular rhythm.     Pulses: Normal pulses.     Heart sounds: Normal heart sounds.  Pulmonary:     Effort: Pulmonary effort is normal.     Breath sounds: Normal breath sounds.  Musculoskeletal:     Right lower leg: No edema.     Left lower leg: No edema.  Neurological:     General: No focal deficit present.     Mental Status: He is alert and oriented to person, place, and time.  Psychiatric:        Mood and Affect: Mood normal.        Behavior: Behavior normal.      LABORATORY DATA:  I have reviewed the labs as listed.  CBC Latest Ref Rng & Units 01/16/2021 12/01/2020 11/23/2020  WBC 4.0 - 10.5 K/uL 6.4 12.3(H) 8.3  Hemoglobin 13.0 -  17.0 g/dL 11.2(L) 11.3(L) 10.3(L)  Hematocrit 39.0 - 52.0  % 37.4(L) 39.2 30.9(L)  Platelets 150 - 400 K/uL 91(L) 246 204   CMP Latest Ref Rng & Units 01/16/2021 12/01/2020 11/23/2020  Glucose 70 - 99 mg/dL 115(H) 104(H) 139(H)  BUN 8 - 23 mg/dL 33(H) 25(H) 61(H)  Creatinine 0.61 - 1.24 mg/dL 1.29(H) 1.02 1.48(H)  Sodium 135 - 145 mmol/L 138 133(L) 130(L)  Potassium 3.5 - 5.1 mmol/L 4.2 4.8 4.3  Chloride 98 - 111 mmol/L 109 102 103  CO2 22 - 32 mmol/L 22 22 16(L)  Calcium 8.9 - 10.3 mg/dL 9.6 8.9 8.9  Total Protein 6.5 - 8.1 g/dL 6.6 6.5 5.4(L)  Total Bilirubin 0.3 - 1.2 mg/dL 0.7 0.7 0.8  Alkaline Phos 38 - 126 U/L 56 74 76  AST 15 - 41 U/L 19 21 15   ALT 0 - 44 U/L 16 27 21    Lab Results  Component Value Date   CEA1 2.8 01/16/2021   CEA1 2.1 10/09/2020   CEA1 3.3 07/02/2020   PSA 0.04 01/16/2021  PSA 28.99 (H) 07/02/2020  PSA 133.41 (H) 04/03/2020    DIAGNOSTIC IMAGING:  I have independently reviewed the scans and discussed with the patient. No results found.   ASSESSMENT:  1. Stage IIb (PT4APN0) cecal cancer: -Right hemicolectomy on 02/04/2020. PT4APN0, MMR preserved. 0/19 lymph nodes involved. Margins negative. Grade 2. -He also had colon cancer in 1999 and underwent surgery without any need for chemotherapy. -We reviewed his labs. CEA is within normal limits. -CT CAP on 01/17/2020 showed irregular wall thickening involving the medial wall of the cecum extending into the terminal ileum. No evidence of metastatic disease. Asymmetric mass involving the right lobe of the prostate gland with extension into the right seminal vesicle and bladder base suspicious for prostate cancer. Stable biliary dilation and bilateral renal cysts. Age-indeterminate T12 compression deformity. -CT shows postoperative changes of right colectomy and ileal reanastomosis.  No evidence of recurrence or metastatic disease.  2. Iron deficiency state: -He has normocytic anemia, combination from CKD and relative iron deficiency. -Feraheme on  03/12/2020.  3.T4a adenocarcinoma the prostate: -Gleason score of 5+4, PSA of 133. -Currently receiving ADT.   PLAN:  1. Stage IIb (PT4APN0) cecal cancer: -Does not have any clinical signs or symptoms of recurrence at this time. -CEA was 2.8.  LFTs were normal.  Abdominal exam did not reveal any masses. -RTC 3 months with repeat labs and CTAP.  2. T4a adenocarcinoma of the prostate: -His PSA today 0.04.  Continue to follow-up with Dr. Alyson Ingles.  3.  Macrocytic anemia: -Hemoglobin is 11.2 with normal MCV. -He had thrombocytopenia which is new.  Platelet count is 91. -As this is a one-time finding, will monitor at this time with 16-monthlab.  4. CKD: -Creatinine is stable around 1.29.  5.  Pelvic pain: -He had multiple minimally displaced fractures of the pelvis and gets pain from time to time.   Orders placed this encounter:  No orders of the defined types were placed in this encounter.    SDerek Jack MD [id number] [phone]  I, DMilinda Antis am acting as a scribe for Dr. SSanda Linger  I, SDerek JackMD, have reviewed the above documentation for accuracy and completeness, and I agree with the above.

## 2021-01-23 ENCOUNTER — Ambulatory Visit (INDEPENDENT_AMBULATORY_CARE_PROVIDER_SITE_OTHER): Payer: Medicare HMO

## 2021-01-23 DIAGNOSIS — C61 Malignant neoplasm of prostate: Secondary | ICD-10-CM

## 2021-01-23 MED ORDER — LEUPROLIDE ACETATE (6 MONTH) 45 MG ~~LOC~~ KIT
45.0000 mg | PACK | Freq: Once | SUBCUTANEOUS | Status: AC
  Administered 2021-01-23: 45 mg via SUBCUTANEOUS

## 2021-01-23 NOTE — Patient Instructions (Signed)
Hormone Suppression Therapy for Prostate Cancer  Hormone suppression therapy is a treatment that can help to slow the growth of cancer cells in the prostate gland. It is also called androgen deprivation therapy (ADT) or androgen suppression therapy. Hormone suppression therapy targets hormones in the body that help cancer cells grow-these hormones are called androgens. Hormone suppression therapy alone will not cure prostate cancer, but it can slow the growth of cancer cells and may shrink tumors over time. Your health care provider can help you find the best treatment that fits your lifestyle. Hormone suppression therapy may be used in the following cases:  When prostate cancer has spread too far to other places in the body and cannot be cured by surgery or radiation.  When a person has health problems that prevent them from having surgery or radiation.  Before radiation to help shrink the size of the cancer and make the radiation treatment more effective.  If the prostate cancer remains or comes back following treatment with surgery or radiation. What are the types of hormone suppression therapy? Orchiectomy Orchiectomy, also called surgical castration, is a surgery to remove one or both testicles. The testicles make the two main androgens-testosterone and dihydrotestosterone. This surgery reduces the levels of testosterone in the blood, leading to decreased androgen production. Medicine therapy Medicine therapy, also called medical or chemical castration, involves taking medicines to keep your body from making or using androgens. Medicines can do this in one of three ways: 1. Reducing androgen production by the testicles: ? Luteinizing hormone-releasing hormone (LHRH) agonist medicines. These medicines are injected or implanted under your skin to lower the amount of androgens that your testicles make. If you take these medicines, you may also be prescribed other medicines to help with side  effects. ? LHRH antagonist medicines. These medicines also work to lower the amount of androgens made in the testicles but they work faster than LHRH agonist medicines and have less severe side effects. They are given as a monthly injection under the skin, and they are used when prostate cancer is in an advanced stage. ? Estrogens (male hormones). These medicines help reduce androgen production by the testicles. Usually, other types of hormone suppression therapy are used first based. If they do not work well or if they stop working, then estrogen may be given. 2. Blocking androgen attachment throughout the body. ? Anti-androgen medicines, also called androgen receptor antagonists, block areas on the body where androgens attach. These are pills that are usually used in combination with other types of hormone suppression therapy, like orchiectomy and other medicines. 3. Blocking androgen production throughout the body. ? Androgen synthesis inhibitor medicines. These medicines help to stop other areas of the body from making androgens. They are taken as pills. They may be used if the prostate cancer is advanced and has not gotten better with surgery or other medicines. A steroid medicine may be given with this type of medicine to help with side effects. What are the risks? Hormone suppression therapy may cause side effects, including:  Hot flashes.  Sexual side effects: ? Decrease or lack of sexual desire. ? Decrease in size of penis or testicles. ? Inability to get an erection (erectile dysfunction, or impotence). ? Breast tenderness or increase in breast size.  Fatigue.  Weight gain.  Thinning of the bones (osteoporosis) or loss of muscle mass.  Depression.  Increased cholesterol levels.  Trouble with thinking or focusing. Hormone suppression therapy may also increase your risk of high blood pressure (  hypertension), stroke, heart attack, or diabetes. What are the benefits? One of the  main benefits of hormone suppression therapy is having additional treatment options. You may have only one type of treatment or two or more types at the same time. Treatments may be combined to:  Help with side effects.  Treat advanced cancer. Contact a health care provider if:  You have pain or side effects that do not get better with treatment.  You have trouble urinating.  You have new side effects that do not go away. Get help right away if:  You have severe chest pain.  You have trouble breathing.  You have an irregular heartbeat.  You have numbness or paralysis in the lower half of your body.  You are confused.  You have trouble talking or understanding speech. These symptoms may be an emergency. Do not wait to see if the symptoms will go away. Get medical help right away. Call your local emergency services (911 in the [place] Do not drive yourself to the hospital. Summary  Hormone suppression therapy is a treatment that can help to slow the growth of cancer cells in the prostate (prostate gland).  Hormone suppression therapy alone will not cure prostate cancer, but it can slow the growth of prostate cancer and may shrink tumors over time.  Treatment to suppress hormones may include surgery or medicines. This information is not intended to replace advice given to you by your health care provider. Make sure you discuss any questions you have with your health care provider. Document Revised: 10/25/2019 Document Reviewed: 10/25/2019 Elsevier Patient Education  2021 Elsevier Inc.  

## 2021-01-23 NOTE — Progress Notes (Signed)
Eligard SubQ Injection   Due to Prostate Cancer patient is present today for a Eligard Injection.  Medication: Eligard 6 month Dose: 45 mg  Location: right  Lot: [id number] Exp: 06/2022  Patient tolerated well, no complications were noted  Performed by: Park Pope RN  Per Dr. Alyson Ingles patient is to continue therapy. Patient's next follow up was scheduled for eligard . This appointment was scheduled using wheel and given to patient today along with reminder continue on Vitamin D 800-1000iu and Calium 1000-1200mg  daily while on Androgen Deprivation Therapy.  [place] approval dates: no pre cert required per insurance plan

## 2021-01-28 ENCOUNTER — Other Ambulatory Visit (HOSPITAL_COMMUNITY)
Admission: RE | Admit: 2021-01-28 | Discharge: 2021-01-28 | Disposition: A | Discharge status: Home / Self Care | Payer: Medicare HMO | Source: Ambulatory Visit | Attending: Nurse Practitioner | Admitting: Nurse Practitioner

## 2021-01-28 ENCOUNTER — Other Ambulatory Visit (HOSPITAL_COMMUNITY): Payer: Self-pay | Admitting: Nurse Practitioner

## 2021-01-28 ENCOUNTER — Ambulatory Visit (HOSPITAL_COMMUNITY)
Admission: RE | Admit: 2021-01-28 | Discharge: 2021-01-28 | Disposition: A | Discharge status: Home / Self Care | Payer: Medicare HMO | Source: Ambulatory Visit | Attending: Nurse Practitioner | Admitting: Nurse Practitioner

## 2021-01-28 ENCOUNTER — Other Ambulatory Visit: Payer: Self-pay

## 2021-01-28 DIAGNOSIS — R531 Weakness: Secondary | ICD-10-CM | POA: Diagnosis not present

## 2021-01-28 DIAGNOSIS — R059 Cough, unspecified: Secondary | ICD-10-CM

## 2021-01-28 DIAGNOSIS — H9113 Presbycusis, bilateral: Secondary | ICD-10-CM | POA: Diagnosis not present

## 2021-01-28 DIAGNOSIS — I959 Hypotension, unspecified: Secondary | ICD-10-CM | POA: Diagnosis not present

## 2021-01-28 LAB — CBC
HCT: 37.9 % — ABNORMAL LOW (ref 39.0–52.0)
Hemoglobin: 11.9 g/dL — ABNORMAL LOW (ref 13.0–17.0)
MCH: 30.1 pg (ref 26.0–34.0)
MCHC: 31.4 g/dL (ref 30.0–36.0)
MCV: 95.7 fL (ref 80.0–100.0)
Platelets: 221 10*3/uL (ref 150–400)
RBC: 3.96 MIL/uL — ABNORMAL LOW (ref 4.22–5.81)
RDW: 14.8 % (ref 11.5–15.5)
WBC: 6.1 10*3/uL (ref 4.0–10.5)
nRBC: 0 % (ref 0.0–0.2)

## 2021-01-28 LAB — COMPREHENSIVE METABOLIC PANEL
ALT: 45 U/L — ABNORMAL HIGH (ref 0–44)
AST: 18 U/L (ref 15–41)
Albumin: 3.8 g/dL (ref 3.5–5.0)
Alkaline Phosphatase: 78 U/L (ref 38–126)
Anion gap: 6 (ref 5–15)
BUN: 45 mg/dL — ABNORMAL HIGH (ref 8–23)
CO2: 24 mmol/L (ref 22–32)
Calcium: 10 mg/dL (ref 8.9–10.3)
Chloride: 104 mmol/L (ref 98–111)
Creatinine, Ser: 1.54 mg/dL — ABNORMAL HIGH (ref 0.61–1.24)
GFR, Estimated: 42 mL/min — ABNORMAL LOW (ref >60)
Glucose, Bld: 96 mg/dL (ref 70–99)
Potassium: 4.8 mmol/L (ref 3.5–5.1)
Sodium: 134 mmol/L — ABNORMAL LOW (ref 135–145)
Total Bilirubin: 0.5 mg/dL (ref 0.3–1.2)
Total Protein: 6.8 g/dL (ref 6.5–8.1)

## 2021-02-04 DIAGNOSIS — I9589 Other hypotension: Secondary | ICD-10-CM | POA: Diagnosis not present

## 2021-02-25 DIAGNOSIS — R630 Anorexia: Secondary | ICD-10-CM | POA: Diagnosis not present

## 2021-04-02 ENCOUNTER — Encounter: Payer: Self-pay | Admitting: Emergency Medicine

## 2021-04-02 ENCOUNTER — Ambulatory Visit
Admission: EM | Admit: 2021-04-02 | Discharge: 2021-04-02 | Disposition: A | Discharge status: Home / Self Care | Payer: Medicare HMO | Attending: Emergency Medicine | Admitting: Emergency Medicine

## 2021-04-02 ENCOUNTER — Other Ambulatory Visit: Payer: Self-pay

## 2021-04-02 DIAGNOSIS — Z79899 Other long term (current) drug therapy: Secondary | ICD-10-CM | POA: Insufficient documentation

## 2021-04-02 DIAGNOSIS — Z7982 Long term (current) use of aspirin: Secondary | ICD-10-CM | POA: Diagnosis not present

## 2021-04-02 DIAGNOSIS — Z87891 Personal history of nicotine dependence: Secondary | ICD-10-CM | POA: Insufficient documentation

## 2021-04-02 DIAGNOSIS — M545 Low back pain, unspecified: Secondary | ICD-10-CM

## 2021-04-02 DIAGNOSIS — Z87440 Personal history of urinary (tract) infections: Secondary | ICD-10-CM | POA: Insufficient documentation

## 2021-04-02 LAB — POCT URINALYSIS DIP (MANUAL ENTRY)
Blood, UA: NEGATIVE
Glucose, UA: 100 mg/dL — AB
Nitrite, UA: POSITIVE — AB
Protein Ur, POC: 100 mg/dL — AB
Spec Grav, UA: 1.01 (ref 1.010–1.025)
Urobilinogen, UA: 4 E.U./dL — AB
pH, UA: 5 (ref 5.0–8.0)

## 2021-04-02 MED ORDER — CEPHALEXIN 500 MG PO CAPS: 500.0000 mg | ORAL_CAPSULE | Freq: Two times a day (BID) | ORAL | 0 refills | Status: AC

## 2021-04-02 NOTE — Discharge Instructions (Signed)
Urine concerning for UTI Urine culture sent.  We will call you with the results.   Push fluids and get plenty of rest.   Take antibiotic as directed and to completion Follow up with PCP if symptoms persists Return here or go to ER if you have any new or worsening symptoms such as fever, worsening abdominal pain, nausea/vomiting, flank pain, etc... 

## 2021-04-02 NOTE — ED Triage Notes (Signed)
Lower back pain.  Has been taking AZO with some improvement with the pain. Hx of same problem.

## 2021-04-02 NOTE — ED Provider Notes (Signed)
[place]   CC: Lower back pain  SUBJECTIVE: HPI obtained from [place] is a 87 y.o. male who complains of lower back pain x 4 days.  Patient denies a precipitating event.  CG states he wears a pad for slight accident.   Has tried OTC AZO with minimal relief.  Denies aggravating factors.  Admits to similar symptoms in the past.  Denies fever, chills, nausea, vomiting, abdominal pain, flank pain, hematuria.    LMP: No LMP for male patient.  ROS: As in HPI.  All other pertinent ROS negative.     Past Medical History:  Diagnosis Date  . BPH (benign prostatic hyperplasia)   . Colon cancer ([place]) 1999  . Dysrhythmia    new onset A Fib  . Essential hypertension   . GERD (gastroesophageal reflux disease)   . Hard of hearing   . Prostate cancer [facility])    Past Surgical History:  Procedure Laterality Date  . BIOPSY  12/28/2019   Procedure: BIOPSY;  Surgeon: Daneil Dolin, MD;  Location: [facility];  Service: Endoscopy;;  gastric, esophagus,cecal  . CATARACT EXTRACTION W/PHACO  01/18/2013   Procedure: CATARACT EXTRACTION PHACO AND INTRAOCULAR LENS PLACEMENT ([place]);  Surgeon: Tonny Branch, MD;  Location: [facility];  Service: Ophthalmology;  Laterality: Left;  CDE 17.11  . CATARACT EXTRACTION W/PHACO Right 02/05/2013   Procedure: CATARACT EXTRACTION PHACO AND INTRAOCULAR LENS PLACEMENT ([facility]);  Surgeon: Tonny Branch, MD;  Location: [facility];  Service: Ophthalmology;  Laterality: Right;  CDE:19.49  . COLON SURGERY  1999   Sigmoid colon cancer, segmental resection by Drs. Smith/Bradford  . COLONOSCOPY  2002   Multiple adenomatous colon polyps  . COLONOSCOPY N/A 12/28/2019   Procedure: COLONOSCOPY;  Surgeon: Daneil Dolin, MD;  Location: [facility];  Service: Endoscopy;  Laterality: N/A;  . ESOPHAGOGASTRODUODENOSCOPY  03/25/2012   Dr. Gala Romney: Severe ulcerative/erosive reflux esophagitis.  Small hiatal hernia.  Abnormal gastric and duodenal mucosa, biopsy showed reactive changes  and minimal chronic inflammation but no H. pylori.  . ESOPHAGOGASTRODUODENOSCOPY N/A 12/28/2019   Procedure: ESOPHAGOGASTRODUODENOSCOPY (EGD);  Surgeon: Daneil Dolin, MD;  Location: [facility];  Service: Endoscopy;  Laterality: N/A;  . HERNIA REPAIR  02/2011   Right inguinal hernia repair with mesh, Dr. Geroge Baseman  . HERNIA REPAIR     three total  . PARTIAL COLECTOMY Right 02/04/2020   Procedure: RIGHT HEMI-COLECTOMY;  Surgeon: Virl Cagey, MD;  Location: [facility];  Service: General;  Laterality: Right;  . POLYPECTOMY  12/28/2019   Procedure: POLYPECTOMY;  Surgeon: Daneil Dolin, MD;  Location: [facility];  Service: Endoscopy;;  rectal    No Known Allergies No current facility-administered medications on file prior to encounter.   Current Outpatient Medications on File Prior to Encounter  Medication Sig Dispense Refill  . albuterol (VENTOLIN HFA) 108 (90 Base) MCG/ACT inhaler Inhale 2 puffs into the lungs every 6 (six) hours as needed for wheezing or shortness of breath. 6.7 g 0  . Aspirin 81 MG CAPS Take 81 mg by mouth daily.    . clonazePAM (KLONOPIN) 0.5 MG tablet     . diphenhydramine-acetaminophen (TYLENOL PM) 25-500 MG TABS tablet Take 4 tablets by mouth at bedtime.    . docusate sodium (COLACE) 100 MG capsule Take 1 capsule (100 mg total) by mouth 2 (two) times daily as needed for mild constipation. 60 capsule 1  . doxycycline (VIBRAMYCIN) 100 MG capsule Take 1 capsule (100 mg  total) by mouth 2 (two) times daily. 12 capsule 0  . Ensure (ENSURE) Take 237 mLs by mouth in the morning, at noon, and at bedtime.     . furosemide (LASIX) 20 MG tablet Take 20 mg by mouth daily as needed for fluid.     Marland Kitchen HYDROcodone-acetaminophen (NORCO) 7.5-325 MG tablet Take 1 tablet by mouth in the morning, at noon, in the evening, and at bedtime.     . methylPREDNISolone (MEDROL DOSEPAK) 4 MG TBPK tablet follow package directions 21 tablet 0  . mirabegron ER (MYRBETRIQ) 50 MG TB24 tablet Take 1  tablet (50 mg total) by mouth daily. 30 tablet 11  . Multiple Vitamins-Minerals (MULTIVITAMIN WITH MINERALS) tablet Take 1 tablet by mouth daily.    Marland Kitchen omeprazole (PRILOSEC) 20 MG capsule Take 20 mg by mouth 2 (two) times daily.  11  . ondansetron (ZOFRAN-ODT) 4 MG disintegrating tablet Take 4 mg by mouth daily as needed for nausea or vomiting.     . Probiotic Product (CULTURELLE PROBIOTICS PO) Take 1 tablet by mouth daily.     . tamsulosin (FLOMAX) 0.4 MG CAPS capsule Take 1 capsule (0.4 mg total) by mouth in the morning and at bedtime. 60 capsule 11  . Vibegron (GEMTESA) 75 MG TABS Take 1 capsule by mouth daily. (Patient taking differently: Take 75 mg by mouth daily.) 30 tablet 0   Social History   Socioeconomic History  . Marital status: Widowed    Spouse name: Not on file  . Number of children: 5  . Years of education: Not on file  . Highest education level: Not on file  Occupational History  . Occupation: Retired Neurosurgeon: RETIRED  Tobacco Use  . Smoking status: Former Smoker    Packs/day: 1.00    Years: 50.00    Pack years: 50.00    Types: Cigarettes    Quit date: 12/13/1989    Years since quitting: 31.3  . Smokeless tobacco: Current User    Types: Snuff  Vaping Use  . Vaping Use: Never used  Substance and Sexual Activity  . Alcohol use: No    Alcohol/week: 0.0 standard drinks    Comment: previous heavy drinker at age [age]  . Drug use: No  . Sexual activity: Not Currently  Other Topics Concern  . Not on file  Social History Narrative  . Not on file   Social Determinants of Health   Financial Resource Strain: Not on file  Food Insecurity: Not on file  Transportation Needs: Not on file  Physical Activity: Not on file  Stress: Not on file  Social Connections: Not on file  Intimate Partner Violence: Not on file   Family History  Problem Relation Age of Onset  . Heart attack Father        Deceased, age 58s  . Diabetes Brother   . Colon cancer Neg  Hx   . Liver disease Neg Hx   . Breast cancer Neg Hx   . Prostate cancer Neg Hx   . Pancreatic cancer Neg Hx     OBJECTIVE:  Vitals:   04/02/21 1658  BP: 93/62  Pulse: (!) 102  Resp: (!) 22  Temp: (!) 97.3 F (36.3 C)  TempSrc: Oral  SpO2: 96%   General appearance: Alert in no acute distress HEENT: NCAT.  Oropharynx clear.  Lungs: clear to auscultation bilaterally without adventitious breath sounds Heart: regular rate and rhythm.   Abdomen: soft; non-distended; no tenderness; bowel sounds  present; no guarding Back: no CVA tenderness Extremities: no edema; symmetrical with no gross deformities Skin: warm and dry Neurologic: Ambulates from chair to exam table without difficulty Psychological: alert and cooperative; normal mood and affect  Labs Reviewed  POCT URINALYSIS DIP (MANUAL ENTRY) - Abnormal; Notable for the following components:      Result Value   Color, UA red (*)    Glucose, UA =100 (*)    Bilirubin, UA small (*)    Ketones, POC UA trace (5) (*)    Protein Ur, POC =100 (*)    Urobilinogen, UA 4.0 (*)    Nitrite, UA Positive (*)    Leukocytes, UA Large (3+) (*)    All other components within normal limits  URINE CULTURE    ASSESSMENT & PLAN:  1. Acute low back pain without sciatica, unspecified back pain laterality   2. Hx: UTI (urinary tract infection)     Meds ordered this encounter  Medications  . cephALEXin (KEFLEX) 500 MG capsule    Sig: Take 1 capsule (500 mg total) by mouth 2 (two) times daily for 10 days.    Dispense:  20 capsule    Refill:  0    Order Specific Question:   Supervising Provider    Answer:   Raylene Everts [[id number]]   Urine concerning for UTI Urine culture sent.  We will call you with the results.   Push fluids and get plenty of rest.   Take antibiotic as directed and to completion Follow up with PCP if symptoms persists Return here or go to ER if you have any new or worsening symptoms such as fever, worsening  abdominal pain, nausea/vomiting, flank pain, etc...  Outlined signs and symptoms indicating need for more acute intervention. Patient verbalized understanding. After Visit Summary given.     Lestine Box, PA-C 04/02/21 1729

## 2021-04-04 LAB — URINE CULTURE: Culture: <10000 — AB

## 2021-04-06 ENCOUNTER — Telehealth: Payer: Self-pay

## 2021-04-06 ENCOUNTER — Telehealth (HOSPITAL_COMMUNITY): Payer: Self-pay | Admitting: Surgery

## 2021-04-06 NOTE — Telephone Encounter (Signed)
Pt's daughter [name] had left a voicemail stating that her father was having severe lower back pain and having difficulty walking.  She wanted to know if her father could come in to see Dr. Delton Coombes today.  I notified Dr. Delton Coombes and let him know that the pt had been seen in the ER on 4/21 due to back pain and a possible UTI.    Per Dr. Delton Coombes, the pt needs to either go to the ER or make an appointment with his primary care physician.  I called the pt's daughter [name] and advised her to take her father to the ER since the pain was causing him difficulty walking.  She verbalized understanding of these instructions.

## 2021-04-06 NOTE — Telephone Encounter (Signed)
Pts daughter called saying pt was having a lot of lower back pain. She said pt taken to Urgent care and that he did not have an infection. Spoke with Dr. Alyson Ingles and he wanted them to Dr. Fransico Michael office . Daughter notified.

## 2021-04-07 ENCOUNTER — Emergency Department (HOSPITAL_COMMUNITY): Payer: Medicare HMO

## 2021-04-07 ENCOUNTER — Encounter (HOSPITAL_COMMUNITY): Payer: Self-pay | Admitting: *Deleted

## 2021-04-07 ENCOUNTER — Other Ambulatory Visit: Payer: Self-pay

## 2021-04-07 ENCOUNTER — Emergency Department (HOSPITAL_COMMUNITY)
Admission: EM | Admit: 2021-04-07 | Discharge: 2021-04-07 | Disposition: A | Discharge status: Home / Self Care | Payer: Medicare HMO | Attending: Emergency Medicine | Admitting: Emergency Medicine

## 2021-04-07 DIAGNOSIS — Z87891 Personal history of nicotine dependence: Secondary | ICD-10-CM | POA: Insufficient documentation

## 2021-04-07 DIAGNOSIS — Z8546 Personal history of malignant neoplasm of prostate: Secondary | ICD-10-CM | POA: Insufficient documentation

## 2021-04-07 DIAGNOSIS — M545 Low back pain, unspecified: Secondary | ICD-10-CM | POA: Diagnosis not present

## 2021-04-07 DIAGNOSIS — Z79899 Other long term (current) drug therapy: Secondary | ICD-10-CM | POA: Diagnosis not present

## 2021-04-07 DIAGNOSIS — Z85038 Personal history of other malignant neoplasm of large intestine: Secondary | ICD-10-CM | POA: Diagnosis not present

## 2021-04-07 DIAGNOSIS — Z8616 Personal history of COVID-19: Secondary | ICD-10-CM | POA: Diagnosis not present

## 2021-04-07 DIAGNOSIS — N183 Chronic kidney disease, stage 3 unspecified: Secondary | ICD-10-CM | POA: Diagnosis not present

## 2021-04-07 DIAGNOSIS — I129 Hypertensive chronic kidney disease with stage 1 through stage 4 chronic kidney disease, or unspecified chronic kidney disease: Secondary | ICD-10-CM | POA: Diagnosis not present

## 2021-04-07 DIAGNOSIS — M549 Dorsalgia, unspecified: Secondary | ICD-10-CM

## 2021-04-07 DIAGNOSIS — R262 Difficulty in walking, not elsewhere classified: Secondary | ICD-10-CM

## 2021-04-07 DIAGNOSIS — Z7982 Long term (current) use of aspirin: Secondary | ICD-10-CM | POA: Insufficient documentation

## 2021-04-07 LAB — URINALYSIS, ROUTINE W REFLEX MICROSCOPIC
Bilirubin Urine: NEGATIVE
Glucose, UA: NEGATIVE mg/dL
Hgb urine dipstick: NEGATIVE
Ketones, ur: NEGATIVE mg/dL
Leukocytes,Ua: NEGATIVE
Nitrite: NEGATIVE
Protein, ur: NEGATIVE mg/dL
Specific Gravity, Urine: 1.017 (ref 1.005–1.030)
pH: 5 (ref 5.0–8.0)

## 2021-04-07 LAB — CBC WITH DIFFERENTIAL/PLATELET
Abs Immature Granulocytes: 0.06 K/uL (ref 0.00–0.07)
Basophils Absolute: 0.1 K/uL (ref 0.0–0.1)
Basophils Relative: 1 %
Eosinophils Absolute: 0.1 K/uL (ref 0.0–0.5)
Eosinophils Relative: 1 %
HCT: 40.3 % (ref 39.0–52.0)
Hemoglobin: 12.6 g/dL — ABNORMAL LOW (ref 13.0–17.0)
Immature Granulocytes: 1 %
Lymphocytes Relative: 7 %
Lymphs Abs: 0.6 K/uL — ABNORMAL LOW (ref 0.7–4.0)
MCH: 30.7 pg (ref 26.0–34.0)
MCHC: 31.3 g/dL (ref 30.0–36.0)
MCV: 98.1 fL (ref 80.0–100.0)
Monocytes Absolute: 0.7 K/uL (ref 0.1–1.0)
Monocytes Relative: 8 %
Neutro Abs: 7.2 K/uL (ref 1.7–7.7)
Neutrophils Relative %: 82 %
Platelets: 218 K/uL (ref 150–400)
RBC: 4.11 MIL/uL — ABNORMAL LOW (ref 4.22–5.81)
RDW: 14.7 % (ref 11.5–15.5)
WBC: 8.7 K/uL (ref 4.0–10.5)
nRBC: 0 % (ref 0.0–0.2)

## 2021-04-07 LAB — BASIC METABOLIC PANEL WITH GFR
Anion gap: 10 (ref 5–15)
BUN: 46 mg/dL — ABNORMAL HIGH (ref 8–23)
CO2: 17 mmol/L — ABNORMAL LOW (ref 22–32)
Calcium: 9.3 mg/dL (ref 8.9–10.3)
Chloride: 107 mmol/L (ref 98–111)
Creatinine, Ser: 1.91 mg/dL — ABNORMAL HIGH (ref 0.61–1.24)
GFR, Estimated: 32 mL/min — ABNORMAL LOW
Glucose, Bld: 111 mg/dL — ABNORMAL HIGH (ref 70–99)
Potassium: 4.9 mmol/L (ref 3.5–5.1)
Sodium: 134 mmol/L — ABNORMAL LOW (ref 135–145)

## 2021-04-07 MED ORDER — IOHEXOL 350 MG/ML SOLN
75.0000 mL | Freq: Once | INTRAVENOUS | Status: AC | PRN
  Administered 2021-04-07: 75 mL via INTRAVENOUS

## 2021-04-07 NOTE — Discharge Instructions (Addendum)
  You were evaluated in the Emergency Department and after careful evaluation, we did not find any emergent condition requiring admission or further testing in the hospital.   Your exam/testing today was overall reassuring.  Symptoms seem to be due to your chronic compression fractures.  I want you to continue taking your Norco as prescribed.  I also want to follow-up with your primary care in the next couple of days and have your blood work repeated since your creatinine was slightly elevated today.  Avoid NSAIDs.  Please return to the Emergency Department if you experience any worsening of your condition.  Thank you for allowing [place] to be a part of your care. Please speak to your pharmacist about any new medications prescribed today in regards to side effects or interactions with other medications.      CT below, please follow up with PCP about findings of chronic fractures  IMPRESSION:  1. No acute intrathoracic, abdominal, or pelvic pathology. No CT  evidence of aortic dissection or aneurysm.  2. Mildly displaced fractures of the right superior and inferior  pubic rami similar to the CT of 2021 and without healing.   IMPRESSION:  1. No acute abnormality within the lumbar spine.  2. Chronic T12 compression deformity with up to 50% height loss and  3 mm bony retropulsion, stable.  3. Degenerative disc bulging with facet hypertrophy at T12-L1 with  resultant moderate left worse than right foraminal stenosis.  4. Aortic Atherosclerosis (ICD10-I70.0).

## 2021-04-07 NOTE — ED Triage Notes (Signed)
Low back pain, difficulty walking

## 2021-04-07 NOTE — ED Notes (Signed)
Pt ambulatory around room using walker. Has walker at home he uses as well. PA made aware.

## 2021-04-07 NOTE — ED Provider Notes (Signed)
[facility] EMERGENCY DEPARTMENT Provider Note   CSN: [id number] Arrival date & time: 04/07/21  1321     History Chief Complaint  Patient presents with  . Back Pain    Tony Holt is a 87 y.o. male with past medical history of colon cancer in remission, prostate cancer, hypertension that presents to the emergency department today for lower back pain with daughter.  Patient is poor historian and hard to communicate with due to his hard of hearing, daughter tells most of HPI.  Daughter states that he had acute onset of lower back pain over the past 4 days, did go to urgent care they did a urinalysis which was negative.  Daughter states this is never happened in the past.  Back pain does not radiate anywhere.  Denies any fevers, chills, nausea, vomiting, abdominal pain, flank, hematuria.  Has been eating and drinking normally.  States that he normally uses a walker and is able to get up on his own, however for the past couple of days its been more difficult.  Denies any trauma.  Denies any chest pain or shortness of breath.  Has been taking his Norco daily.  No saddle paresthesias, no IV drug use, fevers, no bowel or bladder incontinence. HPI     Past Medical History:  Diagnosis Date  . BPH (benign prostatic hyperplasia)   . Colon cancer ([place]) 1999  . Dysrhythmia    new onset A Fib  . Essential hypertension   . GERD (gastroesophageal reflux disease)   . Hard of hearing   . Prostate cancer [facility])     Patient Active Problem List   Diagnosis Date Noted  . COVID-19 virus infection 11/19/2020  . GERD without esophagitis 11/19/2020  . Acute metabolic encephalopathy 30/86/5784  . Protein-calorie malnutrition ([place]) 11/19/2020  . BPH without obstruction/lower urinary tract symptoms 10/15/2020  . Nocturia 10/15/2020  . Pelvis fracture, right, closed, initial encounter ([place]) 10/01/2020  . Lobar pneumonia ([place]) 09/03/2020  . Acute respiratory failure with hypoxia ([place]) 09/03/2020  .  Acute renal failure superimposed on stage 3a chronic kidney disease ([place]) 09/03/2020  . Respiratory failure with hypoxia ([place]) 09/02/2020  . Pneumonia of both lungs due to infectious organism   . Elevated PSA 04/28/2020  . AKI (acute kidney injury) ([place]) 02/23/2020  . Prostate cancer ([place]) 02/22/2020  . Atrial fibrillation, chronic ([place]) 02/22/2020  . Cecal cancer ([place])   . Iron deficiency anemia due to chronic blood loss 05/17/2019  . Constipation 05/17/2019  . Bradycardia 05/28/2016  . Weakness 05/28/2016  . Acute renal failure ([place]) 05/28/2016  . Hyperkalemia 05/28/2016  . Nausea alone 03/24/2012  . Abdominal pain, epigastric 03/24/2012  . Essential hypertension 03/24/2012  . ANEMIA 09/16/2010  . HEMOCCULT POSITIVE STOOL 09/16/2010    Past Surgical History:  Procedure Laterality Date  . BIOPSY  12/28/2019   Procedure: BIOPSY;  Surgeon: Daneil Dolin, MD;  Location: [facility] ENDO SUITE;  Service: Endoscopy;;  gastric, esophagus,cecal  . CATARACT EXTRACTION W/PHACO  01/18/2013   Procedure: CATARACT EXTRACTION PHACO AND INTRAOCULAR LENS PLACEMENT ([place]);  Surgeon: Tonny Branch, MD;  Location: [facility] ORS;  Service: Ophthalmology;  Laterality: Left;  CDE 17.11  . CATARACT EXTRACTION W/PHACO Right 02/05/2013   Procedure: CATARACT EXTRACTION PHACO AND INTRAOCULAR LENS PLACEMENT (IOC);  Surgeon: Tonny Branch, MD;  Location: [facility] ORS;  Service: Ophthalmology;  Laterality: Right;  CDE:19.49  . COLON SURGERY  1999   Sigmoid colon cancer, segmental resection by Drs. Smith/Bradford  . COLONOSCOPY  2002   Multiple adenomatous colon polyps  . COLONOSCOPY N/A 12/28/2019   Procedure: COLONOSCOPY;  Surgeon: Daneil Dolin, MD;  Location: [facility] END[facility] SUITE;  Service: Endoscopy;  Laterality: N/A;  . ESOPHAGOGASTRODUODENOSCOPY  03/25/2012   Dr. Gala Romney: Severe ulcerative/erosive reflux esophagitis.  Small hiatal hernia.  Abnormal gastric and duodenal mucosa, biopsy showed reactive changes and minimal chronic inflammation  but no H. pylori.  . ESOPHAGOGASTRODUODENOSCOPY N/A 12/28/2019   Procedure: ESOPHAGOGASTRODUODENOSCOPY (EGD);  Surgeon: Daneil Dolin, MD;  Location: [facility] END[facility] SU[facility];  Service: Endoscopy;  Laterality: N/A;  . HERNIA REPAIR  02/2011   Right inguinal hernia repair with mesh, Dr. Geroge Baseman  . HERNIA REPAIR     three total  . PARTIAL COLECTOMY Right 02/04/2020   Procedure: RIGHT HEMI-COLECTOMY;  Surgeon: Virl Cagey, MD;  Location: [facility] ORS;  Service: General;  Laterality: Right;  . POLYPECTOMY  12/28/2019   Procedure: POLYPECTOMY;  Surgeon: Daneil Dolin, MD;  Location: [facility];  Service: Endoscopy;;  rectal        Family History  Problem Relation Age of Onset  . Heart attack Father        Deceased, age 16s  . Diabetes Brother   . Colon cancer Neg Hx   . Liver disease Neg Hx   . Breast cancer Neg Hx   . Prostate cancer Neg Hx   . Pancreatic cancer Neg Hx     Social History   Tobacco Use  . Smoking status: Former Smoker    Packs/day: 1.00    Years: 50.00    Pack years: 50.00    Types: Cigarettes    Quit date: 12/13/1989    Years since quitting: 31.3  . Smokeless tobacco: Current User    Types: Snuff  Vaping Use  . Vaping Use: Never used  Substance Use Topics  . Alcohol use: No    Alcohol/week: 0.0 standard drinks    Comment: previous heavy drinker at age 49-30  . Drug use: No    Home Medications Prior to Admission medications   Medication Sig Start Date End Date Taking? Authorizing Provider  albuterol (VENTOLIN HFA) 108 (90 Base) MCG/ACT inhaler Inhale 2 puffs into the lungs every 6 (six) hours as needed for wheezing or shortness of breath. 11/23/20   Thurnell Lose, MD  Aspirin 81 MG CAPS Take 81 mg by mouth daily. 09/04/20   Orson Eva, MD  cephALEXin (KEFLEX) 500 MG capsule Take 1 capsule (500 mg total) by mouth 2 (two) times daily for 10 days. 04/02/21 04/12/21  Wurst, Marye Round, PA-C  clonazePAM (KLONOPIN) 0.5 MG tablet  11/28/20   [provider]   diphenhydramine-acetaminophen (TYLENOL PM) 25-500 MG TABS tablet Take 4 tablets by mouth at bedtime.    [provider]  docusate sodium (COLACE) 100 MG capsule Take 1 capsule (100 mg total) by mouth 2 (two) times daily as needed for mild constipation. 02/07/20   Virl Cagey, MD  doxycycline (VIBRAMYCIN) 100 MG capsule Take 1 capsule (100 mg total) by mouth 2 (two) times daily. 11/23/20   Thurnell Lose, MD  Ensure (ENSURE) Take 237 mLs by mouth in the morning, at noon, and at bedtime.     [provider]  furosemide (LASIX) 20 MG tablet Take 20 mg by mouth daily as needed for fluid.  10/01/20   [provider]  HYDROcodone-acetaminophen (NORCO) 7.5-325 MG tablet Take 1 tablet by mouth in the morning, at noon, in the evening, and at  bedtime.  10/01/20   [provider]  methylPREDNISolone (MEDROL DOSEPAK) 4 MG TBPK tablet follow package directions 11/23/20   Thurnell Lose, MD  mirabegron ER (MYRBETRIQ) 50 MG TB24 tablet Take 1 tablet (50 mg total) by mouth daily. 01/16/21   McKenzie, Candee Furbish, MD  Multiple Vitamins-Minerals (MULTIVITAMIN WITH MINERALS) tablet Take 1 tablet by mouth daily.    [provider]  omeprazole (PRILOSEC) 20 MG capsule Take 20 mg by mouth 2 (two) times daily. 05/09/16   [provider]  ondansetron (ZOFRAN-ODT) 4 MG disintegrating tablet Take 4 mg by mouth daily as needed for nausea or vomiting.  10/02/20   [provider]  Probiotic Product (CULTURELLE PROBIOTICS PO) Take 1 tablet by mouth daily.     [provider]  tamsulosin (FLOMAX) 0.4 MG CAPS capsule Take 1 capsule (0.4 mg total) by mouth in the morning and at bedtime. 01/16/21   McKenzie, Candee Furbish, MD  Vibegron (GEMTESA) 75 MG TABS Take 1 capsule by mouth daily. Patient taking differently: Take 75 mg by mouth daily. 11/13/20   McKenzie, Candee Furbish, MD    Allergies    Patient has no known allergies.  Review of Systems   Review of  Systems  Constitutional: Negative for chills, diaphoresis, fatigue and fever.  HENT: Negative for congestion, sore throat and trouble swallowing.   Eyes: Negative for pain and visual disturbance.  Respiratory: Negative for cough, shortness of breath and wheezing.   Cardiovascular: Negative for chest pain, palpitations and leg swelling.  Gastrointestinal: Negative for abdominal distention, abdominal pain, diarrhea, nausea and vomiting.  Genitourinary: Negative for difficulty urinating.  Musculoskeletal: Positive for back pain. Negative for neck pain and neck stiffness.  Skin: Negative for pallor.  Neurological: Negative for dizziness, speech difficulty, weakness and headaches.  Psychiatric/Behavioral: Negative for confusion.    Physical Exam Updated Vital Signs BP 105/69 (BP Location: Left Arm)   Pulse 81   Temp 98 F (36.7 C) (Oral)   Resp 17   SpO2 99%   Physical Exam Constitutional:      General: He is not in acute distress.    Appearance: Normal appearance. He is not ill-appearing, toxic-appearing or diaphoretic.  HENT:     Mouth/Throat:     Mouth: Mucous membranes are moist.     Pharynx: Oropharynx is clear.  Eyes:     General: No scleral icterus.    Extraocular Movements: Extraocular movements intact.     Pupils: Pupils are equal, round, and reactive to light.  Cardiovascular:     Rate and Rhythm: Normal rate and regular rhythm.     Pulses: Normal pulses.     Heart sounds: Normal heart sounds.  Pulmonary:     Effort: Pulmonary effort is normal. No respiratory distress.     Breath sounds: Normal breath sounds. No stridor. No wheezing, rhonchi or rales.  Chest:     Chest wall: No tenderness.  Abdominal:     General: Abdomen is flat. There is no distension.     Palpations: Abdomen is soft.     Tenderness: There is no abdominal tenderness. There is no guarding or rebound.  Musculoskeletal:        General: No swelling or tenderness. Normal range of motion.      Cervical back: Normal range of motion and neck supple. No rigidity.       Back:     Right lower leg: No edema.     Left lower leg: No edema.  Comments: Lower back pain ranges through back expected.  No objective numbness.  No erythema or rashes noted. Lower extremities with normal strength, normal leg raise.  Compartments are soft.  PT pulses 2+.  Able to ambulate with walker.  Skin:    General: Skin is warm and dry.     Capillary Refill: Capillary refill takes less than 2 seconds.     Coloration: Skin is not pale.  Neurological:     General: No focal deficit present.     Mental Status: He is alert and oriented to person, place, and time.     Cranial Nerves: No cranial nerve deficit.     Sensory: No sensory deficit.     Motor: No weakness.     Coordination: Coordination normal.     Gait: Gait normal.  Psychiatric:        Mood and Affect: Mood normal.        Behavior: Behavior normal.     ED Results / Procedures / Treatments   Labs (all labs ordered are listed, but only abnormal results are displayed) Labs Reviewed  BASIC METABOLIC PANEL - Abnormal; Notable for the following components:      Result Value   Sodium 134 (*)    CO2 17 (*)    Glucose, Bld 111 (*)    BUN 46 (*)    Creatinine, Ser 1.91 (*)    GFR, Estimated 32 (*)    All other components within normal limits  CBC WITH DIFFERENTIAL/PLATELET - Abnormal; Notable for the following components:   RBC 4.11 (*)    Hemoglobin 12.6 (*)    Lymphs Abs 0.6 (*)    All other components within normal limits  URINALYSIS, ROUTINE W REFLEX MICROSCOPIC    EKG None  Radiology DG Pelvis 1-2 Views  Result Date: 04/07/2021 CLINICAL DATA:  87 year old male with back pain. EXAM: PELVIS - 1-2 VIEW COMPARISON:  None. FINDINGS: There is no acute fracture or dislocation. Evaluation for fracture however is limited due to advanced osteopenia. Probable old healed fractures of the inferior pubic rami. Surgical sutures noted in the left  lower quadrant. The soft tissues are unremarkable. IMPRESSION: 1. No acute fracture or dislocation. 2. Osteopenia. Electronically Signed   By: Anner Crete M.D.   On: 04/07/2021 15:59   CT L-SPINE NO CHARGE  Result Date: 04/07/2021 CLINICAL DATA:  Initial evaluation for acute lower back pain with difficulty walking. EXAM: CT LUMBAR SPINE WITHOUT CONTRAST TECHNIQUE: Multidetector CT imaging of the lumbar spine was performed without intravenous contrast administration. Multiplanar CT image reconstructions were also generated. COMPARISON:  Prior CT from 01/17/2020 FINDINGS: Segmentation: Standard. Lowest well-formed disc space labeled the L5-S1 level. Alignment: Mild sigmoid scoliosis. Alignment otherwise normal with preservation of the normal lumbar lordosis. No listhesis. Vertebrae: Chronic compression deformity of T12 with up to 50% height loss and 3 mm bony retropulsion, stable. Vertebral body height otherwise maintained without acute or chronic fracture. Visualized sacrum and pelvis intact. No discrete or worrisome osseous lesions. Underlying osteopenia noted. Paraspinal and other soft tissues: Paraspinous soft tissues demonstrate no acute finding. Scattered cysts noted about the partially visualized kidneys. Aortic atherosclerosis noted. Disc levels: T12-L1: Degenerative intervertebral disc space narrowing with diffuse disc bulge. Up to 3 mm bony retropulsion related to the chronic T12 compression fracture. Mild facet hypertrophy. No significant spinal stenosis. Moderate left worse than right foraminal narrowing. L1-2: Minimal annular disc bulge with facet hypertrophy. No stenosis. L2-3: Minimal annular disc bulge with facet hypertrophy. No stenosis.  L3-4: Minimal annular disc bulge. Mild to moderate facet hypertrophy. No significant stenosis. L4-5: Mild diffuse disc bulge. Mild to moderate facet hypertrophy. No significant spinal stenosis. Foramina remain patent. L5-S1: Negative interspace. Mild facet  hypertrophy. No canal or foraminal stenosis. IMPRESSION: 1. No acute abnormality within the lumbar spine. 2. Chronic T12 compression deformity with up to 50% height loss and 3 mm bony retropulsion, stable. 3. Degenerative disc bulging with facet hypertrophy at T12-L1 with resultant moderate left worse than right foraminal stenosis. 4. Aortic Atherosclerosis (ICD10-I70.0). Electronically Signed   By: Jeannine Boga M.D.   On: 04/07/2021 19:45   CT Angio Chest/Abd/Pel for Dissection W and/or W/WO  Result Date: 04/07/2021 CLINICAL DATA:  87 year old male with chest and back pain. Concern for aortic dissection. EXAM: CT ANGIOGRAPHY CHEST, ABDOMEN AND PELVIS TECHNIQUE: Non-contrast CT of the chest was initially obtained. Multidetector CT imaging through the chest, abdomen and pelvis was performed using the standard protocol during bolus administration of intravenous contrast. Multiplanar reconstructed images and MIPs were obtained and reviewed to evaluate the vascular anatomy. CONTRAST:  81mL OMNIPAQUE IOHEXOL 350 MG/ML SOLN COMPARISON:  CT abdomen pelvis dated 10/01/2020. FINDINGS: Evaluation of this exam is limited due to respiratory motion artifact. CTA CHEST FINDINGS Cardiovascular: Mild cardiomegaly. No pericardial effusion. There is coronary vascular calcification. Moderate atherosclerotic calcification of the thoracic aorta. No aneurysmal dilatation or dissection. The origins of the great vessels of the aortic arch appear patent as visualized. The central pulmonary arteries are unremarkable for the degree of opacification. Mediastinum/Nodes: No hilar or mediastinal adenopathy. There is a small hiatal hernia. The esophagus and the thyroid gland are grossly unremarkable. No mediastinal fluid collection. Lungs/Pleura: Mild chronic interstitial coarsening. An area of streaky density in the right upper lobe likely atelectasis or scarring. No focal consolidation, pleural effusion, or pneumothorax. The central  airways are patent. Musculoskeletal: Osteopenia with degenerative changes of the spine. No acute osseous pathology. Review of the MIP images confirms the above findings. CTA ABDOMEN AND PELVIS FINDINGS VASCULAR Aorta: Moderate atherosclerotic calcification. No aneurysmal dilatation or dissection. No periaortic fluid collection. Celiac: Atherosclerotic calcification of the origin of the celiac axis. The celiac axis and its major branches remain patent. SMA: Atherosclerotic calcification of the origin of the SMA. The SMA is patent. Renals: There is atherosclerotic calcification of the renal ostia. The renal arteries are patent. IMA: Patent without evidence of aneurysm, dissection, vasculitis or significant stenosis. Inflow: Atherosclerotic calcification of the iliac arteries. No aneurysmal dilatation or dissection. The iliac arteries are patent. Veins: No obvious venous abnormality within the limitations of this arterial phase study. Review of the MIP images confirms the above findings. NON-VASCULAR No intra-abdominal free air or free fluid. Hepatobiliary: The liver is unremarkable. No intrahepatic biliary ductal dilatation. No definite calcified gallstone. Apparent nodular thickening of the distal body and fundus may represent gallbladder folds or areas of adenomyomatosis. Evaluation is limited due to respiratory motion. Pancreas: No active inflammatory changes. Spleen: No acute findings. Adrenals/Urinary Tract: The adrenal glands unremarkable. There are bilateral renal cysts and smaller hypodense lesions which are too small to characterize. There is no hydronephrosis on either side. The visualized ureters and urinary bladder appear unremarkable. Stomach/Bowel: There is postsurgical changes of bowel with anastomotic sutures. There is no bowel obstruction or active inflammation. Lymphatic: No adenopathy. Reproductive: The prostate and seminal vesicles are grossly unremarkable. No pelvic masses Other: Midline vertical  anterior pelvic wall incisional scar. Right inguinal hernia repair plug. Musculoskeletal: Osteopenia with degenerative changes of the spine.  Mildly displaced fractures of the right superior and inferior pubic rami similar to the CT of 2021 and without healing. There is advanced osteopenia. There is 6 lumbar type vertebra. There is mild chronic compression fracture of L1 similar to prior CT. No new fracture. Review of the MIP images confirms the above findings. IMPRESSION: 1. No acute intrathoracic, abdominal, or pelvic pathology. No CT evidence of aortic dissection or aneurysm. 2. Mildly displaced fractures of the right superior and inferior pubic rami similar to the CT of 2021 and without healing. Electronically Signed   By: Anner Crete M.D.   On: 04/07/2021 20:08    Procedures Procedures   Medications Ordered in ED Medications  iohexol (OMNIPAQUE) 350 MG/ML injection 75 mL (75 mLs Intravenous Contrast Given 04/07/21 1922)    ED Course  I have reviewed the triage vital signs and the nursing notes.  Pertinent labs & imaging results that were available during my care of the patient were reviewed by me and considered in my medical decision making (see chart for details).    MDM Rules/Calculators/A&P                         ZE COLALUCA is a 87 y.o. male with past medical history of colon cancer in remission, prostate cancer, hypertension that presents to the emergency department today for lower back pain with daughter.  Normal neuro exam, able to walk.  Work-up today unremarkable, does show old compression fractures.  Low concerns for cauda equina.   BMP remarkable for CO2 of 17, BUN 46, creatinine 1.91.  Creatinine not too far from baseline about 2 to 3 months ago.  Will recommend continued Percocet for his pain and follow-up with PCP.  Doubt need for further emergent work up at this time. I explained the diagnosis and have given explicit precautions to return to the ER including for any  other new or worsening symptoms. The patient understands and accepts the medical plan as it's been dictated and I have answered their questions. Discharge instructions concerning home care and prescriptions have been given. The patient is STABLE and is discharged to home in good condition.  I discussed this case with my attending physician who cosigned this note including patient's presenting symptoms, physical exam, and planned diagnostics and interventions. Attending physician stated agreement with plan or made changes to plan which were implemented.   Final Clinical Impression(s) / ED Diagnoses Final diagnoses:  Acute midline back pain  Acute bilateral low back pain without sciatica    Rx / DC Orders ED Discharge Orders    None       [name], PA-C 04/07/21 2103    Milton Ferguson, MD 04/07/21 2256

## 2021-04-07 NOTE — ED Triage Notes (Signed)
Emergency Medicine Provider Triage Evaluation Note  Tony Holt , a 87 y.o. male  was evaluated in triage.  Pt complains of lower back pain for the past couple of days, spans throughout his back.  No trauma or falls.  Difficulty ambulating due to lower back pain.  Review of Systems  Positive: Lower back pain Negative: Abdominal pain nausea vomiting fevers dysuria  Physical Exam  BP 100/61   Pulse 87   Temp 97.7 F (36.5 C) (Oral)   Resp (!) 21   SpO2 99%  Gen:   Awake, no distress   HEENT:  Atraumatic  Resp:  Normal effort  Cardiac:  Normal rate  Abd:   Nondistended, nontender  MSK:   Moves extremities without difficulty  Neuro:  Speech clear   Medical Decision Making  Medically screening exam initiated at 3:09 PM.  Appropriate orders placed.  Tony Holt was informed that the remainder of the evaluation will be completed by another provider, this initial triage assessment does not replace that evaluation, and the importance of remaining in the ED until their evaluation is complete.  Clinical Impression  Stable  MSE was initiated and I personally evaluated the patient and placed orders (if any) at  3:10 PM on April 07, 2021.  The patient appears stable so that the remainder of the MSE may be completed by another provider.    Tony Client, PA-C 04/07/21 1510

## 2021-04-07 NOTE — ED Notes (Signed)
Delay in labs due to lack of IV access x2 nurses. CN notified.

## 2021-04-15 ENCOUNTER — Observation Stay (HOSPITAL_COMMUNITY): Payer: Medicare HMO

## 2021-04-15 ENCOUNTER — Observation Stay (HOSPITAL_COMMUNITY)
Admission: EM | Admit: 2021-04-15 | Discharge: 2021-04-18 | Disposition: A | Discharge status: Home / Self Care | Payer: Medicare HMO | Attending: Emergency Medicine | Admitting: Emergency Medicine

## 2021-04-15 ENCOUNTER — Emergency Department (HOSPITAL_COMMUNITY): Payer: Medicare HMO

## 2021-04-15 ENCOUNTER — Encounter (HOSPITAL_COMMUNITY): Payer: Self-pay

## 2021-04-15 ENCOUNTER — Other Ambulatory Visit: Payer: Self-pay

## 2021-04-15 DIAGNOSIS — Z85038 Personal history of other malignant neoplasm of large intestine: Secondary | ICD-10-CM | POA: Insufficient documentation

## 2021-04-15 DIAGNOSIS — Z7982 Long term (current) use of aspirin: Secondary | ICD-10-CM | POA: Diagnosis not present

## 2021-04-15 DIAGNOSIS — R531 Weakness: Secondary | ICD-10-CM

## 2021-04-15 DIAGNOSIS — J189 Pneumonia, unspecified organism: Principal | ICD-10-CM

## 2021-04-15 DIAGNOSIS — E876 Hypokalemia: Secondary | ICD-10-CM | POA: Diagnosis not present

## 2021-04-15 DIAGNOSIS — R0602 Shortness of breath: Secondary | ICD-10-CM | POA: Diagnosis present

## 2021-04-15 DIAGNOSIS — Z79899 Other long term (current) drug therapy: Secondary | ICD-10-CM | POA: Insufficient documentation

## 2021-04-15 DIAGNOSIS — Z792 Long term (current) use of antibiotics: Secondary | ICD-10-CM | POA: Insufficient documentation

## 2021-04-15 DIAGNOSIS — N189 Chronic kidney disease, unspecified: Secondary | ICD-10-CM

## 2021-04-15 DIAGNOSIS — R778 Other specified abnormalities of plasma proteins: Secondary | ICD-10-CM

## 2021-04-15 DIAGNOSIS — Z87891 Personal history of nicotine dependence: Secondary | ICD-10-CM | POA: Insufficient documentation

## 2021-04-15 DIAGNOSIS — K219 Gastro-esophageal reflux disease without esophagitis: Secondary | ICD-10-CM | POA: Diagnosis present

## 2021-04-15 DIAGNOSIS — Z8546 Personal history of malignant neoplasm of prostate: Secondary | ICD-10-CM | POA: Diagnosis not present

## 2021-04-15 DIAGNOSIS — R2689 Other abnormalities of gait and mobility: Secondary | ICD-10-CM | POA: Insufficient documentation

## 2021-04-15 DIAGNOSIS — C180 Malignant neoplasm of cecum: Secondary | ICD-10-CM | POA: Diagnosis not present

## 2021-04-15 DIAGNOSIS — Z20822 Contact with and (suspected) exposure to covid-19: Secondary | ICD-10-CM | POA: Diagnosis not present

## 2021-04-15 DIAGNOSIS — N1832 Chronic kidney disease, stage 3b: Secondary | ICD-10-CM | POA: Insufficient documentation

## 2021-04-15 DIAGNOSIS — N179 Acute kidney failure, unspecified: Secondary | ICD-10-CM

## 2021-04-15 DIAGNOSIS — I129 Hypertensive chronic kidney disease with stage 1 through stage 4 chronic kidney disease, or unspecified chronic kidney disease: Secondary | ICD-10-CM | POA: Diagnosis not present

## 2021-04-15 DIAGNOSIS — E860 Dehydration: Secondary | ICD-10-CM

## 2021-04-15 DIAGNOSIS — I482 Chronic atrial fibrillation, unspecified: Secondary | ICD-10-CM | POA: Diagnosis present

## 2021-04-15 DIAGNOSIS — I10 Essential (primary) hypertension: Secondary | ICD-10-CM | POA: Diagnosis present

## 2021-04-15 DIAGNOSIS — D72829 Elevated white blood cell count, unspecified: Secondary | ICD-10-CM | POA: Diagnosis not present

## 2021-04-15 DIAGNOSIS — R0600 Dyspnea, unspecified: Secondary | ICD-10-CM

## 2021-04-15 DIAGNOSIS — C61 Malignant neoplasm of prostate: Secondary | ICD-10-CM | POA: Diagnosis present

## 2021-04-15 DIAGNOSIS — R7989 Other specified abnormal findings of blood chemistry: Secondary | ICD-10-CM

## 2021-04-15 LAB — COMPREHENSIVE METABOLIC PANEL
ALT: 28 U/L (ref 0–44)
AST: 21 U/L (ref 15–41)
Albumin: 3.4 g/dL — ABNORMAL LOW (ref 3.5–5.0)
Alkaline Phosphatase: 61 U/L (ref 38–126)
Anion gap: 9 (ref 5–15)
BUN: 47 mg/dL — ABNORMAL HIGH (ref 8–23)
CO2: 19 mmol/L — ABNORMAL LOW (ref 22–32)
Calcium: 10.1 mg/dL (ref 8.9–10.3)
Chloride: 107 mmol/L (ref 98–111)
Creatinine, Ser: 2.03 mg/dL — ABNORMAL HIGH (ref 0.61–1.24)
GFR, Estimated: 30 mL/min — ABNORMAL LOW (ref >60)
Glucose, Bld: 99 mg/dL (ref 70–99)
Potassium: 4.9 mmol/L (ref 3.5–5.1)
Sodium: 135 mmol/L (ref 135–145)
Total Bilirubin: 0.7 mg/dL (ref 0.3–1.2)
Total Protein: 7.2 g/dL (ref 6.5–8.1)

## 2021-04-15 LAB — URINALYSIS, ROUTINE W REFLEX MICROSCOPIC
Bacteria, UA: NONE SEEN
Bilirubin Urine: NEGATIVE
Glucose, UA: NEGATIVE mg/dL
Hgb urine dipstick: NEGATIVE
Ketones, ur: NEGATIVE mg/dL
Nitrite: NEGATIVE
Protein, ur: NEGATIVE mg/dL
Specific Gravity, Urine: 1.016 (ref 1.005–1.030)
pH: 5 (ref 5.0–8.0)

## 2021-04-15 LAB — D-DIMER, QUANTITATIVE: D-Dimer, Quant: 1.52 ug/mL-FEU — ABNORMAL HIGH (ref 0.00–0.50)

## 2021-04-15 LAB — CBC WITH DIFFERENTIAL/PLATELET
Abs Immature Granulocytes: 0.56 10*3/uL — ABNORMAL HIGH (ref 0.00–0.07)
Basophils Absolute: 0.1 10*3/uL (ref 0.0–0.1)
Basophils Relative: 2 %
Eosinophils Absolute: 0.1 10*3/uL (ref 0.0–0.5)
Eosinophils Relative: 1 %
HCT: 37.9 % — ABNORMAL LOW (ref 39.0–52.0)
Hemoglobin: 12.2 g/dL — ABNORMAL LOW (ref 13.0–17.0)
Immature Granulocytes: 6 %
Lymphocytes Relative: 7 %
Lymphs Abs: 0.7 10*3/uL (ref 0.7–4.0)
MCH: 30.3 pg (ref 26.0–34.0)
MCHC: 32.2 g/dL (ref 30.0–36.0)
MCV: 94 fL (ref 80.0–100.0)
Monocytes Absolute: 0.9 10*3/uL (ref 0.1–1.0)
Monocytes Relative: 10 %
Neutro Abs: 6.5 10*3/uL (ref 1.7–7.7)
Neutrophils Relative %: 74 %
Platelets: 334 10*3/uL (ref 150–400)
RBC: 4.03 MIL/uL — ABNORMAL LOW (ref 4.22–5.81)
RDW: 14.3 % (ref 11.5–15.5)
WBC: 8.8 10*3/uL (ref 4.0–10.5)
nRBC: 0 % (ref 0.0–0.2)

## 2021-04-15 LAB — TROPONIN I (HIGH SENSITIVITY)
Troponin I (High Sensitivity): 29 ng/L — ABNORMAL HIGH (ref <18)
Troponin I (High Sensitivity): 29 ng/L — ABNORMAL HIGH (ref <18)

## 2021-04-15 LAB — RESP PANEL BY RT-PCR (FLU A&B, COVID) ARPGX2
Influenza A by PCR: NEGATIVE
Influenza B by PCR: NEGATIVE
SARS Coronavirus 2 by RT PCR: NEGATIVE

## 2021-04-15 LAB — BRAIN NATRIURETIC PEPTIDE: B Natriuretic Peptide: 192 pg/mL — ABNORMAL HIGH (ref 0.0–100.0)

## 2021-04-15 MED ORDER — APIXABAN 5 MG PO TABS: 5.0000 mg | ORAL_TABLET | Freq: Two times a day (BID) | ORAL | Status: DC

## 2021-04-15 MED ORDER — TECHNETIUM TO 99M ALBUMIN AGGREGATED
4.4000 | Freq: Once | INTRAVENOUS | Status: AC | PRN
  Administered 2021-04-15: 4.4 via INTRAVENOUS

## 2021-04-15 MED ORDER — SODIUM CHLORIDE 0.9 % IV SOLN
1000.0000 mL | INTRAVENOUS | Status: DC
  Administered 2021-04-15: 1000 mL via INTRAVENOUS

## 2021-04-15 MED ORDER — APIXABAN (ELIQUIS) EDUCATION KIT FOR DVT/PE PATIENTS
PACK | Freq: Once | Status: DC
  Filled 2021-04-15: qty 1

## 2021-04-15 MED ORDER — SODIUM CHLORIDE 0.9 % IV BOLUS (SEPSIS)
500.0000 mL | Freq: Once | INTRAVENOUS | Status: AC
  Administered 2021-04-15: 500 mL via INTRAVENOUS

## 2021-04-15 MED ORDER — APIXABAN 5 MG PO TABS
10.0000 mg | ORAL_TABLET | Freq: Two times a day (BID) | ORAL | Status: DC
  Administered 2021-04-15 – 2021-04-16 (×2): 10 mg via ORAL
  Filled 2021-04-15 (×2): qty 2

## 2021-04-15 NOTE — ED Provider Notes (Signed)
Emergency Medicine Provider Triage Evaluation Note  Tony Holt , a 87 y.o. male with past medical history of prostate and colon cancer, GERD, anemia and hypertension was evaluated in triage.  Seen here last week for back pain.  Pt complains of cough, shortness of breath, and "pain all over."  Patient is accompanied by his daughter who provides most of the history.  She reports productive cough for 1 week.  Was seen by PCP yesterday given a injection of antibiotics and oral antibiotic.  Family contacted PCPs office this morning and was advised to bring patient to the emergency department for further evaluation.  Daughter denies known fever at home.  Review of Systems  Positive: Cough, shortness of breath Negative: Fever, nausea, vomiting, chest pain  Physical Exam  BP 108/67 (BP Location: Right Arm)   Pulse 95   Temp 97.7 F (36.5 C) (Oral)   Resp 20   Ht 5\' 9"  (1.753 m)   Wt 62.6 kg   SpO2 98%   BMI 20.38 kg/m  Gen:   Awake, no distress, frail-appearing Resp:  Normal effort, scattered rhonchi throughout MSK:   Moves extremities without difficulty no peripheral edema Other:    Medical Decision Making  Medically screening exam initiated at 2:40 PM.  Appropriate orders placed.  MAAN ZARCONE was informed that the remainder of the evaluation will be completed by another provider, this initial triage assessment does not replace that evaluation, and the importance of remaining in the ED until their evaluation is complete.  Patient seen here under advisement of PCPs office for evaluation of pneumonia.  Was given injection of antibiotics yesterday and is taking him on oral antibiotic.  Patient will need further evaluation in the ER.  Patient and family member agreeable to plan.   Kem Parkinson, PA-C 04/15/21 1444    Milton Ferguson, MD 04/16/21 [phone]

## 2021-04-15 NOTE — ED Triage Notes (Signed)
Pt to er, support person states that pt is here for a cough and sob, states that they went to see md nolton yesterday and was d/x with pneumonia, states that he is here because he isn't getting any better.

## 2021-04-15 NOTE — ED Provider Notes (Signed)
[facility] EMERGENCY DEPARTMENT Provider Note   CSN: [id number] Arrival date & time: 04/15/21  1358     History Chief Complaint  Patient presents with  . Shortness of Breath    Tony Holt is a 87 y.o. male.  HPI   Patient presented to the ER for evaluation of cough shortness of breath and myalgias.  Has history of prostate and colon cancer.  He also has history of hypertension.  Patient was in the emergency room about a week ago for back pain.  According to the daughter who provided most of the history the patient has been coughing recently and becoming more short of breath.  He has had generalized fatigue and weakness.  He saw the primary doctor yesterday and was diagnosed with pneumonia and started on antibiotics.  He is not any better today so they brought him to the emergency room.  Started understand the patient so is not really able to give me a lot of other detailed information.  No known fevers.  No vomiting or diarrhea.  Past Medical History:  Diagnosis Date  . BPH (benign prostatic hyperplasia)   . Colon cancer ([place]) 1999  . Dysrhythmia    new onset A Fib  . Essential hypertension   . GERD (gastroesophageal reflux disease)   . Hard of hearing   . Prostate cancer [facility])     Patient Active Problem List   Diagnosis Date Noted  . COVID-19 virus infection 11/19/2020  . GERD without esophagitis 11/19/2020  . Acute metabolic encephalopathy 99991111  . Protein-calorie malnutrition ([place]) 11/19/2020  . BPH without obstruction/lower urinary tract symptoms 10/15/2020  . Nocturia 10/15/2020  . Pelvis fracture, right, closed, initial encounter ([place]) 10/01/2020  . Lobar pneumonia ([place]) 09/03/2020  . Acute respiratory failure with hypoxia ([place]) 09/03/2020  . Acute renal failure superimposed on stage 3a chronic kidney disease ([place]) 09/03/2020  . Respiratory failure with hypoxia ([place]) 09/02/2020  . Pneumonia of both lungs due to infectious organism   . Elevated PSA  04/28/2020  . AKI (acute kidney injury) ([place]) 02/23/2020  . Prostate cancer ([place]) 02/22/2020  . Atrial fibrillation, chronic ([place]) 02/22/2020  . Cecal cancer ([place])   . Iron deficiency anemia due to chronic blood loss 05/17/2019  . Constipation 05/17/2019  . Bradycardia 05/28/2016  . Weakness 05/28/2016  . Acute renal failure ([place]) 05/28/2016  . Hyperkalemia 05/28/2016  . Nausea alone 03/24/2012  . Abdominal pain, epigastric 03/24/2012  . Essential hypertension 03/24/2012  . ANEMIA 09/16/2010  . HEMOCCULT POSITIVE STOOL 09/16/2010    Past Surgical History:  Procedure Laterality Date  . BIOPSY  12/28/2019   Procedure: BIOPSY;  Surgeon: Daneil Dolin, MD;  Location: [facility] ENDO SUITE;  Service: Endoscopy;;  gastric, esophagus,cecal  . CATARACT EXTRACTION W/PHACO  01/18/2013   Procedure: CATARACT EXTRACTION PHACO AND INTRAOCULAR LENS PLACEMENT ([place]);  Surgeon: Tonny Branch, MD;  Location: [facility] ORS;  Service: Ophthalmology;  Laterality: Left;  CDE 17.11  . CATARACT EXTRACTION W/PHACO Right 02/05/2013   Procedure: CATARACT EXTRACTION PHACO AND INTRAOCULAR LENS PLACEMENT (IOC);  Surgeon: Tonny Branch, MD;  Location: [facility] ORS;  Service: Ophthalmology;  Laterality: Right;  CDE:19.49  . COLON SURGERY  1999   Sigmoid colon cancer, segmental resection by Drs. Smith/Bradford  . COLONOSCOPY  2002   Multiple adenomatous colon polyps  . COLONOSCOPY N/A 12/28/2019   Procedure: COLONOSCOPY;  Surgeon: Daneil Dolin, MD;  Location: [facility] ENDO SUITE;  Service: Endoscopy;  Laterality: N/A;  . ESOPHAGOGASTRODUODENOSCOPY  03/25/2012  Dr. Gala Romney: Severe ulcerative/erosive reflux esophagitis.  Small hiatal hernia.  Abnormal gastric and duodenal mucosa, biopsy showed reactive changes and minimal chronic inflammation but no H. pylori.  . ESOPHAGOGASTRODUODENOSCOPY N/A 12/28/2019   Procedure: ESOPHAGOGASTRODUODENOSCOPY (EGD);  Surgeon: Daneil Dolin, MD;  Location: [facility] ENDO SUITE;  Service: Endoscopy;  Laterality: N/A;  .  HERNIA REPAIR  02/2011   Right inguinal hernia repair with mesh, Dr. Geroge Baseman  . HERNIA REPAIR     three total  . PARTIAL COLECTOMY Right 02/04/2020   Procedure: RIGHT HEMI-COLECTOMY;  Surgeon: Virl Cagey, MD;  Location: [facility] ORS;  Service: General;  Laterality: Right;  . POLYPECTOMY  12/28/2019   Procedure: POLYPECTOMY;  Surgeon: Daneil Dolin, MD;  Location: [facility] ENDO SUITE;  Service: Endoscopy;;  rectal        Family History  Problem Relation Age of Onset  . Heart attack Father        Deceased, age 58s  . Diabetes Brother   . Colon cancer Neg Hx   . Liver disease Neg Hx   . Breast cancer Neg Hx   . Prostate cancer Neg Hx   . Pancreatic cancer Neg Hx     Social History   Tobacco Use  . Smoking status: Former Smoker    Packs/day: 1.00    Years: 50.00    Pack years: 50.00    Types: Cigarettes    Quit date: 12/13/1989    Years since quitting: 31.3  . Smokeless tobacco: Current User    Types: Snuff  Vaping Use  . Vaping Use: Never used  Substance Use Topics  . Alcohol use: No    Alcohol/week: 0.0 standard drinks    Comment: previous heavy drinker at age 65-32  . Drug use: No    Home Medications Prior to Admission medications   Medication Sig Start Date End Date Taking? Authorizing Provider  albuterol (VENTOLIN HFA) 108 (90 Base) MCG/ACT inhaler Inhale 2 puffs into the lungs every 6 (six) hours as needed for wheezing or shortness of breath. 11/23/20  Yes Thurnell Lose, MD  Aspirin 81 MG CAPS Take 81 mg by mouth daily. 09/04/20  Yes Tat, Shanon Brow, MD  clonazePAM (KLONOPIN) 0.5 MG tablet Take 0.5 mg by mouth daily. 11/28/20  Yes [provider]  diphenhydramine-acetaminophen (TYLENOL PM) 25-500 MG TABS tablet Take 4 tablets by mouth at bedtime.   Yes [provider]  docusate sodium (COLACE) 100 MG capsule Take 1 capsule (100 mg total) by mouth 2 (two) times daily as needed for mild constipation. 02/07/20  Yes Virl Cagey, MD  Ensure (ENSURE)  Take 237 mLs by mouth in the morning, at noon, and at bedtime.    Yes [provider]  guaiFENesin (MUCINEX) 600 MG 12 hr tablet Take 600 mg by mouth 2 (two) times daily.   Yes [provider]  HYDROcodone-acetaminophen (NORCO) 7.5-325 MG tablet Take 1 tablet by mouth in the morning, at noon, in the evening, and at bedtime.  10/01/20  Yes [provider]  levofloxacin (LEVAQUIN) 500 MG tablet Take 500 mg by mouth daily. 04/14/21  Yes [provider]  mirabegron ER (MYRBETRIQ) 50 MG TB24 tablet Take 1 tablet (50 mg total) by mouth daily. 01/16/21  Yes McKenzie, Candee Furbish, MD  Multiple Vitamins-Minerals (MULTIVITAMIN WITH MINERALS) tablet Take 1 tablet by mouth daily.   Yes [provider]  omeprazole (PRILOSEC) 20 MG capsule Take 20 mg by mouth 2 (two) times daily. 05/09/16  Yes  [provider]  ondansetron (ZOFRAN-ODT) 4 MG disintegrating tablet Take 4 mg by mouth daily as needed for nausea or vomiting.  10/02/20  Yes [provider]  Probiotic Product (CULTURELLE PROBIOTICS PO) Take 1 tablet by mouth daily.    Yes [provider]  tamsulosin (FLOMAX) 0.4 MG CAPS capsule Take 1 capsule (0.4 mg total) by mouth in the morning and at bedtime. 01/16/21  Yes McKenzie, Candee Furbish, MD  doxycycline (VIBRAMYCIN) 100 MG capsule Take 1 capsule (100 mg total) by mouth 2 (two) times daily. Patient not taking: No sig reported 11/23/20   Thurnell Lose, MD  furosemide (LASIX) 20 MG tablet Take 20 mg by mouth daily as needed for fluid.  Patient not taking: Reported on 04/15/2021 10/01/20   [provider]  methylPREDNISolone (MEDROL DOSEPAK) 4 MG TBPK tablet follow package directions Patient not taking: No sig reported 11/23/20   Thurnell Lose, MD  Vibegron (GEMTESA) 75 MG TABS Take 1 capsule by mouth daily. Patient not taking: No sig reported 11/13/20   Cleon Gustin, MD    Allergies    Patient has no known allergies.  Review  of Systems   Review of Systems  All other systems reviewed and are negative.   Physical Exam Updated Vital Signs BP 114/81   Pulse 81   Temp 97.7 F (36.5 C) (Oral)   Resp (!) 26   Ht 1.753 m (5\' 9" )   Wt 62.6 kg   SpO2 97%   BMI 20.38 kg/m   Physical Exam Vitals and nursing note reviewed.  Constitutional:      Appearance: He is ill-appearing. He is not diaphoretic.     Comments: Elderly, frail  HENT:     Head: Normocephalic and atraumatic.     Right Ear: External ear normal.     Left Ear: External ear normal.  Eyes:     General: No scleral icterus.       Right eye: No discharge.        Left eye: No discharge.     Conjunctiva/sclera: Conjunctivae normal.  Neck:     Trachea: No tracheal deviation.  Cardiovascular:     Rate and Rhythm: Normal rate and regular rhythm.  Pulmonary:     Effort: Pulmonary effort is normal. No respiratory distress.     Breath sounds: Normal breath sounds. No stridor. No wheezing or rales.  Abdominal:     General: Bowel sounds are normal. There is no distension.     Palpations: Abdomen is soft.     Tenderness: There is no abdominal tenderness. There is no guarding or rebound.  Musculoskeletal:        General: No tenderness.     Cervical back: Neck supple.  Skin:    General: Skin is warm and dry.     Findings: No rash.  Neurological:     General: No focal deficit present.     Mental Status: He is alert.     Cranial Nerves: No cranial nerve deficit (no facial droop, extraocular movements intact, no slurred speech).     Sensory: No sensory deficit.     Motor: Weakness present. No abnormal muscle tone or seizure activity.     Coordination: Coordination normal.     Comments: Generalized weakness, patient is able to move bilateral upper extremities and lower extremities but his movements are slow and require significant effort     ED Results / Procedures / Treatments   Labs (all labs ordered  are listed, but only abnormal results are  displayed) Labs Reviewed  COMPREHENSIVE METABOLIC PANEL - Abnormal; Notable for the following components:      Result Value   CO2 19 (*)    BUN 47 (*)    Creatinine, Ser 2.03 (*)    Albumin 3.4 (*)    GFR, Estimated 30 (*)    All other components within normal limits  CBC WITH DIFFERENTIAL/PLATELET - Abnormal; Notable for the following components:   RBC 4.03 (*)    Hemoglobin 12.2 (*)    HCT 37.9 (*)    Abs Immature Granulocytes 0.56 (*)    All other components within normal limits  BRAIN NATRIURETIC PEPTIDE - Abnormal; Notable for the following components:   B Natriuretic Peptide 192.0 (*)    All other components within normal limits  URINALYSIS, ROUTINE W REFLEX MICROSCOPIC - Abnormal; Notable for the following components:   Leukocytes,Ua TRACE (*)    All other components within normal limits  D-DIMER, QUANTITATIVE - Abnormal; Notable for the following components:   D-Dimer, Quant 1.52 (*)    All other components within normal limits  TROPONIN I (HIGH SENSITIVITY) - Abnormal; Notable for the following components:   Troponin I (High Sensitivity) 29 (*)    All other components within normal limits  TROPONIN I (HIGH SENSITIVITY) - Abnormal; Notable for the following components:   Troponin I (High Sensitivity) 29 (*)    All other components within normal limits  RESP PANEL BY RT-PCR (FLU A&B, COVID) [id number]    EKG EKG Interpretation  Date/Time:  Wednesday Apr 15 2021 15:53:48 EDT Ventricular Rate:  93 PR Interval:    QRS Duration: 85 QT Interval:  365 QTC Calculation: 454 R Axis:   -17 Text Interpretation: Atrial fibrillation Borderline left axis deviation Artifact No significant change since last tracing 1 hr earlier Confirmed by Dorie Rank [phone]) on 04/15/2021 3:55:43 PM   Radiology DG Chest Portable 1 View  Result Date: 04/15/2021 CLINICAL DATA:  Weakness and shortness of breath. EXAM: PORTABLE CHEST 1 VIEW COMPARISON:  PA and lateral chest 01/28/2021 and 12/01/2020.  CT chest 04/07/2021. FINDINGS: Lung volumes are somewhat low with mild basilar atelectasis, worse on the left. No pneumothorax or pleural fluid. Cardiomegaly. Aortic atherosclerosis. No acute or focal bony abnormality. IMPRESSION: No acute disease. Cardiomegaly. Aortic Atherosclerosis (ICD10-I70.0). Electronically Signed   By: Inge Rise M.D.   On: 04/15/2021 15:52    Procedures .Critical Care Performed by: Dorie Rank, MD Authorized by: Dorie Rank, MD   Critical care provider statement:    Critical care time (minutes):  45   Critical care was time spent personally by me on the following activities:  Discussions with consultants, evaluation of patient's response to treatment, examination of patient, ordering and performing treatments and interventions, ordering and review of laboratory studies, ordering and review of radiographic studies, pulse oximetry, re-evaluation of patient's condition, obtaining history from patient or surrogate and review of old charts     Medications Ordered in ED Medications  sodium chloride 0.9 % bolus 500 mL (500 mLs Intravenous New Bag/Given 04/15/21 1843)    Followed by  0.9 %  sodium chloride infusion (has no administration in time range)    ED Course  I have reviewed the triage vital signs and the nursing notes.  Pertinent labs & imaging results that were available during my care of the patient were reviewed by me and considered in my medical decision making (see chart for details).  Clinical Course as  of 04/15/21 1938  Wed Apr 15, 2021  1720 Chest x-ray without acute findings [JK]  1800 Creatinine increasing compared to previous [JK]  1811 Troponin slightly elevated but similar to previous values.  COVID and influenza are negative. [JK]  1931 Urinalysis not suggestive of infection [JK]  1932 D-dimer is elevated. [JK]    Clinical Course User Index [JK] Dorie Rank, MD   MDM Rules/Calculators/A&P                         Patient presented to the  ED with increasing shortness of breath and myalgias as well as cough.  Patient was treated for possible pneumonia by primary care doctor.  Symptoms have progressed despite treatment.  In the ED the patient was not febrile and was not hypoxic but I was concerned about the possibility of pneumonia as well as CHF or pulmonary embolism.  Chest x-ray does not show pneumonia.  Patient's laboratory tests are negative for COVID but he does have an elevated D-dimer and troponin.  BNP is only slightly elevated but I doubt CHF.  Patient's renal function is abnormal so he is not a candidate for CT scan.  Suspect he has a component of dehydration with his worsening renal function.  I will give the patient dose of Lovenox.  I have ordered a VQ scan but this cannot be performed until the morning.  Is possible symptoms could be related to URI.  I will consult the medical service for admission observation.  Final Clinical Impression(s) / ED Diagnoses Final diagnoses:  Dyspnea, unspecified type  Positive D dimer      Dorie Rank, MD 04/15/21 1940

## 2021-04-15 NOTE — H&P (Signed)
History and Physical  Tony Holt HGD:[id number] DOB: 04-07-1926 DOA: 04/15/2021  Referring physician: Dorie Rank, MD PCP: [name], MD  Patient coming from: Home  Chief Complaint: Shortness of breath  HPI: Tony Holt is a 87 y.o. male with medical history significant for GERD, hypertension, BPH, chronic atrial fibrillation, prostate cancer, cecal adenocarcinoma (diagnosed-February 2021) and CKD stage IIIA who presents to the emergency department due to cough, shortness of breath and body aches.  Patient was unable to provide history, history was obtained from ED physician and daughter at bedside.  Per daughter, patient developed cough, chest congestion about a week ago, but this has since progressed to increased generalized weakness and fatigue.  He went to his primary care physician yesterday and was diagnosed to have pneumonia, intramuscular antibiotic was given, patient returned to the PCP for follow-up, on seeing that patient's patient continued to worsen, he was asked to go to the ED for further evaluation.  There was no reported fever, nausea or vomiting.  ED Course:  In the emergency department, he was initially tachycardic, but otherwise vitals are within normal range.  In the ED showed normocytic anemia, BMP showed BUN/creatinine 47/2.03 (baseline creatinine at 1.3-1.5), influenza A, B, SARS coronavirus 2 was negative.   Chest x-ray showed no acute disease. VQ scan showed no evidence to strongly suggest the presence of pulmonary embolism. IV hydration was provided.  Hospitalist was asked to admit patient for further evaluation and management.  Review of Systems:  Constitutional: Negative for chills and fever.  HENT: Negative for ear pain and sore throat.   Eyes: Negative for pain and visual disturbance.  Respiratory: Positive for cough, chest congestion and shortness of breath.   Cardiovascular: Negative for chest pain and palpitations.  Gastrointestinal: Negative  for abdominal pain and vomiting.  Endocrine: Negative for polyphagia and polyuria.  Genitourinary: Negative for decreased urine volume, dysuria, enuresis Musculoskeletal: Negative for arthralgias and back pain.  Skin: Negative for color change and rash.  Allergic/Immunologic: Negative for immunocompromised state.  Neurological: Negative for tremors, syncope, speech difficulty and headaches.  Hematological: Does not bruise/bleed easily.  All other systems reviewed and are negative   Past Medical History:  Diagnosis Date  . BPH (benign prostatic hyperplasia)   . Colon cancer ([place]) 1999  . Dysrhythmia    new onset A Fib  . Essential hypertension   . GERD (gastroesophageal reflux disease)   . Hard of hearing   . Prostate cancer [facility])    Past Surgical History:  Procedure Laterality Date  . BIOPSY  12/28/2019   Procedure: BIOPSY;  Surgeon: Daneil Dolin, MD;  Location: [facility];  Service: Endoscopy;;  gastric, esophagus,cecal  . CATARACT EXTRACTION W/PHACO  01/18/2013   Procedure: CATARACT EXTRACTION PHACO AND INTRAOCULAR LENS PLACEMENT ([place]);  Surgeon: Tonny Branch, MD;  Location: [facility] ORS;  Service: Ophthalmology;  Laterality: Left;  CDE 17.11  . CATARACT EXTRACTION W/PHACO Right 02/05/2013   Procedure: CATARACT EXTRACTION PHACO AND INTRAOCULAR LENS PLACEMENT ([facility]);  Surgeon: Tonny Branch, MD;  Location: [facility] ORS;  Service: Ophthalmology;  Laterality: Right;  CDE:19.49  . COLON SURGERY  1999   Sigmoid colon cancer, segmental resection by Drs. Smith/Bradford  . COLONOSCOPY  2002   Multiple adenomatous colon polyps  . COLONOSCOPY N/A 12/28/2019   Procedure: COLONOSCOPY;  Surgeon: Daneil Dolin, MD;  Location: [facility];  Service: Endoscopy;  Laterality: N/A;  . ESOPHAGOGASTRODUODENOSCOPY  03/25/2012   Dr. Gala Romney: Severe ulcerative/erosive reflux esophagitis.  Small  hiatal hernia.  Abnormal gastric and duodenal mucosa, biopsy showed reactive changes and minimal chronic inflammation but no  H. pylori.  . ESOPHAGOGASTRODUODENOSCOPY N/A 12/28/2019   Procedure: ESOPHAGOGASTRODUODENOSCOPY (EGD);  Surgeon: Daneil Dolin, MD;  Location: [facility];  Service: Endoscopy;  Laterality: N/A;  . HERNIA REPAIR  02/2011   Right inguinal hernia repair with mesh, Dr. Geroge Baseman  . HERNIA REPAIR     three total  . PARTIAL COLECTOMY Right 02/04/2020   Procedure: RIGHT HEMI-COLECTOMY;  Surgeon: Virl Cagey, MD;  Location: [facility] ORS;  Service: General;  Laterality: Right;  . POLYPECTOMY  12/28/2019   Procedure: POLYPECTOMY;  Surgeon: Daneil Dolin, MD;  Location: [facility];  Service: Endoscopy;;  rectal     Social History:  reports that he quit smoking about 31 years ago. His smoking use included cigarettes. He has a 50.00 pack-year smoking history. His smokeless tobacco use includes snuff. He reports that he does not drink alcohol and does not use drugs.   No Known Allergies  Family History  Problem Relation Age of Onset  . Heart attack Father        Deceased, age 42s  . Diabetes Brother   . Colon cancer Neg Hx   . Liver disease Neg Hx   . Breast cancer Neg Hx   . Prostate cancer Neg Hx   . Pancreatic cancer Neg Hx    Prior to Admission medications   Medication Sig Start Date End Date Taking? Authorizing Provider  albuterol (VENTOLIN HFA) 108 (90 Base) MCG/ACT inhaler Inhale 2 puffs into the lungs every 6 (six) hours as needed for wheezing or shortness of breath. 11/23/20  Yes Thurnell Lose, MD  Aspirin 81 MG CAPS Take 81 mg by mouth daily. 09/04/20  Yes Tat, Shanon Brow, MD  clonazePAM (KLONOPIN) 0.5 MG tablet Take 0.5 mg by mouth daily. 11/28/20  Yes [provider]  diphenhydramine-acetaminophen (TYLENOL PM) 25-500 MG TABS tablet Take 4 tablets by mouth at bedtime.   Yes [provider]  docusate sodium (COLACE) 100 MG capsule Take 1 capsule (100 mg total) by mouth 2 (two) times daily as needed for mild constipation. 02/07/20  Yes Virl Cagey, MD   Ensure (ENSURE) Take 237 mLs by mouth in the morning, at noon, and at bedtime.    Yes [provider]  guaiFENesin (MUCINEX) 600 MG 12 hr tablet Take 600 mg by mouth 2 (two) times daily.   Yes [provider]  HYDROcodone-acetaminophen (NORCO) 7.5-325 MG tablet Take 1 tablet by mouth in the morning, at noon, in the evening, and at bedtime.  10/01/20  Yes [provider]  levofloxacin (LEVAQUIN) 500 MG tablet Take 500 mg by mouth daily. 04/14/21  Yes [provider]  mirabegron ER (MYRBETRIQ) 50 MG TB24 tablet Take 1 tablet (50 mg total) by mouth daily. 01/16/21  Yes McKenzie, Candee Furbish, MD  Multiple Vitamins-Minerals (MULTIVITAMIN WITH MINERALS) tablet Take 1 tablet by mouth daily.   Yes [provider]  omeprazole (PRILOSEC) 20 MG capsule Take 20 mg by mouth 2 (two) times daily. 05/09/16  Yes [provider]  ondansetron (ZOFRAN-ODT) 4 MG disintegrating tablet Take 4 mg by mouth daily as needed for nausea or vomiting.  10/02/20  Yes [provider]  Probiotic Product (CULTURELLE PROBIOTICS PO) Take 1 tablet by mouth daily.    Yes [provider]  tamsulosin (FLOMAX) 0.4 MG CAPS capsule Take 1 capsule (0.4 mg total) by mouth in the  morning and at bedtime. 01/16/21  Yes McKenzie, Candee Furbish, MD  doxycycline (VIBRAMYCIN) 100 MG capsule Take 1 capsule (100 mg total) by mouth 2 (two) times daily. Patient not taking: No sig reported 11/23/20   Thurnell Lose, MD  furosemide (LASIX) 20 MG tablet Take 20 mg by mouth daily as needed for fluid.  Patient not taking: Reported on 04/15/2021 10/01/20   [provider]  methylPREDNISolone (MEDROL DOSEPAK) 4 MG TBPK tablet follow package directions Patient not taking: No sig reported 11/23/20   Thurnell Lose, MD  Vibegron (GEMTESA) 75 MG TABS Take 1 capsule by mouth daily. Patient not taking: No sig reported 11/13/20   Cleon Gustin, MD    Physical Exam: BP 125/79 (BP  Location: Right Arm)   Pulse 86   Temp 97.6 F (36.4 C)   Resp 16   Ht 5\' 9"  (1.753 m)   Wt 62.6 kg   SpO2 98%   BMI 20.38 kg/m   . General: 87 y.o. year-old male ill-appearing but in no acute distress. Marland Kitchen HEENT: NCAT, EOMI . Neck: Supple, trachea medial . Cardiovascular: Regular rate and rhythm with no rubs or gallops.  No thyromegaly or JVD noted.  No lower extremity edema. 2/4 pulses in all 4 extremities. Marland Kitchen Respiratory: Clear to auscultation with no wheezes or rales. Good inspiratory effort. . Abdomen: Soft nontender nondistended with normal bowel sounds x4 quadrants. . Muskuloskeletal: No cyanosis, clubbing or edema noted bilaterally . Neuro: CN II-XII intact, no focal neurological deficit. sensation, reflexes intact . Skin: No ulcerative lesions noted or rashes . Psychiatry:  Mood is appropriate for condition and setting          Labs on Admission:  Basic Metabolic Panel: Recent Labs  Lab 04/15/21 1504  NA 135  K 4.9  CL 107  CO2 19*  GLUCOSE 99  BUN 47*  CREATININE 2.03*  CALCIUM 10.1   Liver Function Tests: Recent Labs  Lab 04/15/21 1504  AST 21  ALT 28  ALKPHOS 61  BILITOT 0.7  PROT 7.2  ALBUMIN 3.4*   No results for input(s): LIPASE, AMYLASE in the last 168 hours. No results for input(s): AMMONIA in the last 168 hours. CBC: Recent Labs  Lab 04/15/21 1504  WBC 8.8  NEUTROABS 6.5  HGB 12.2*  HCT 37.9*  MCV 94.0  PLT 334   Cardiac Enzymes: No results for input(s): CKTOTAL, CKMB, CKMBINDEX, TROPONINI in the last 168 hours.  BNP (last 3 results) Recent Labs    11/22/20 0236 11/23/20 0355 04/15/21 1649  BNP 755.7* 739.1* 192.0*    ProBNP (last 3 results) No results for input(s): PROBNP in the last 8760 hours.  CBG: No results for input(s): GLUCAP in the last 168 hours.  Radiological Exams on Admission: NM Pulmonary Perfusion  Result Date: 04/15/2021 CLINICAL DATA:  Shortness of breath x 1 month. EXAM: NUCLEAR MEDICINE PERFUSION LUNG  SCAN TECHNIQUE: Perfusion images were obtained in multiple projections after intravenous injection of radiopharmaceutical. Ventilation scans intentionally deferred if perfusion scan and chest x-ray adequate for interpretation during COVID 19 epidemic. RADIOPHARMACEUTICALS:  4.4 mCi Tc-66m MAA IV COMPARISON:  None. FINDINGS: Markedly limited study secondary to positioning of the patient's upper extremities as well as the inability of the patient to lay flat on his back. Diffuse, heterogeneously decreased tracer uptake is noted involving the upper lobes of both lungs, however, this is likely, in part, secondary to the previously noted study limitations. No segmental or subsegmental focal perfusion defects  are identified. IMPRESSION: 1. Markedly limited study, as described above, without evidence to strongly suggest the presence of pulmonary embolism. Electronically Signed   By: Virgina Norfolk M.D.   On: 04/15/2021 22:19   DG Chest Portable 1 View  Result Date: 04/15/2021 CLINICAL DATA:  Weakness and shortness of breath. EXAM: PORTABLE CHEST 1 VIEW COMPARISON:  PA and lateral chest 01/28/2021 and 12/01/2020. CT chest 04/07/2021. FINDINGS: Lung volumes are somewhat low with mild basilar atelectasis, worse on the left. No pneumothorax or pleural fluid. Cardiomegaly. Aortic atherosclerosis. No acute or focal bony abnormality. IMPRESSION: No acute disease. Cardiomegaly. Aortic Atherosclerosis (ICD10-I70.0). Electronically Signed   By: Inge Rise M.D.   On: 04/15/2021 15:52    EKG: I independently viewed the EKG done and my findings are as followed: A. fib with rate control  Assessment/Plan Present on Admission: . Shortness of breath . Essential hypertension . Cecal cancer ([place]) . Prostate cancer ([place]) . Atrial fibrillation, chronic ([place]) . GERD without esophagitis  Active Problems:   Essential hypertension   Generalized weakness   Acute kidney injury superimposed on CKD (HCC)   Cecal cancer  (HCC)   Prostate cancer ([place])   Atrial fibrillation, chronic (HCC)   CAP (community acquired pneumonia)   GERD without esophagitis   Shortness of breath   Elevated d-dimer  Shortness of breath possibly secondary to presumed CAP POA Patient presents with dry cough and chest congestion.  However, he had no fever or elevated white blood cells. Chest x-ray showed no acute findings This is possibly viral in nature, however, procalcitonin will be checked prior to starting patient on IV antibiotics Continue Mucinex, incentive spirometry, flutter valve  Generalized weakness in the setting of above Fall precaution and neurochecks Continue protein supplement  Acute kidney injury on CKD stage IIIb BUN/creatinine 47/2.03 (baseline creatinine at 1.3-1.5) Continue gentle hydration Renally adjust medications, avoid nephrotoxic agents/dehydration/hypotension  Elevated D-dimer D-dimer 1.5 (chronically elevated) VQ scan showed no evidence of PE  Chronic atrial fibrillation EKG personally reviewed showed atrial fibrillation with rate control Continue Eliquis No rate control medication noted on patient's med rec.we shall await updated med rec  Essential hypertension Lasix will be temporarily held due to acute kidney injury  GERD Continue Protonix  Elevated BNP (chronic) BNP at 192 (this was 739.1 on 11/23/2020)  Elevated troponin possibly secondary to type II demand ischemia Troponin x2 was flat at 29 Patient denies any chest pain  Colon cancer and prostate cancer Patient follows with Dr. Delton Coombes for cecal cancer Patient follows with [facility] health urology [place]    DVT prophylaxis: Eliquis  Code Status: Full code  Family Communication: None at bedside  Disposition Plan:  Patient is from:                        home Anticipated DC to:                   home Anticipated DC date:               1 day Anticipated DC barriers:           Patient needs inpatient management for  generalized weakness with presumed CAP pending rule out   Consults called: None  Admission status: Observation    Bernadette Hoit MD [facility]  04/16/2021, 4:20 AM

## 2021-04-16 DIAGNOSIS — C180 Malignant neoplasm of cecum: Secondary | ICD-10-CM | POA: Diagnosis not present

## 2021-04-16 DIAGNOSIS — N179 Acute kidney failure, unspecified: Secondary | ICD-10-CM | POA: Diagnosis not present

## 2021-04-16 DIAGNOSIS — R778 Other specified abnormalities of plasma proteins: Secondary | ICD-10-CM

## 2021-04-16 DIAGNOSIS — J189 Pneumonia, unspecified organism: Secondary | ICD-10-CM | POA: Diagnosis not present

## 2021-04-16 DIAGNOSIS — I482 Chronic atrial fibrillation, unspecified: Secondary | ICD-10-CM | POA: Diagnosis not present

## 2021-04-16 DIAGNOSIS — R7989 Other specified abnormal findings of blood chemistry: Secondary | ICD-10-CM

## 2021-04-16 LAB — CBC
HCT: 34.8 % — ABNORMAL LOW (ref 39.0–52.0)
Hemoglobin: 11.3 g/dL — ABNORMAL LOW (ref 13.0–17.0)
MCH: 30.4 pg (ref 26.0–34.0)
MCHC: 32.5 g/dL (ref 30.0–36.0)
MCV: 93.5 fL (ref 80.0–100.0)
Platelets: 330 10*3/uL (ref 150–400)
RBC: 3.72 MIL/uL — ABNORMAL LOW (ref 4.22–5.81)
RDW: 14 % (ref 11.5–15.5)
WBC: 9.2 10*3/uL (ref 4.0–10.5)
nRBC: 0 % (ref 0.0–0.2)

## 2021-04-16 LAB — COMPREHENSIVE METABOLIC PANEL
ALT: 21 U/L (ref 0–44)
AST: 14 U/L — ABNORMAL LOW (ref 15–41)
Albumin: 3 g/dL — ABNORMAL LOW (ref 3.5–5.0)
Alkaline Phosphatase: 52 U/L (ref 38–126)
Anion gap: 8 (ref 5–15)
BUN: 38 mg/dL — ABNORMAL HIGH (ref 8–23)
CO2: 15 mmol/L — ABNORMAL LOW (ref 22–32)
Calcium: 9.3 mg/dL (ref 8.9–10.3)
Chloride: 110 mmol/L (ref 98–111)
Creatinine, Ser: 1.53 mg/dL — ABNORMAL HIGH (ref 0.61–1.24)
GFR, Estimated: 42 mL/min — ABNORMAL LOW (ref >60)
Glucose, Bld: 85 mg/dL (ref 70–99)
Potassium: 4.2 mmol/L (ref 3.5–5.1)
Sodium: 133 mmol/L — ABNORMAL LOW (ref 135–145)
Total Bilirubin: 0.9 mg/dL (ref 0.3–1.2)
Total Protein: 6.2 g/dL — ABNORMAL LOW (ref 6.5–8.1)

## 2021-04-16 LAB — PROTIME-INR
INR: 1.6 — ABNORMAL HIGH (ref 0.8–1.2)
Prothrombin Time: 18.9 seconds — ABNORMAL HIGH (ref 11.4–15.2)

## 2021-04-16 LAB — APTT: aPTT: 45 seconds — ABNORMAL HIGH (ref 24–36)

## 2021-04-16 LAB — PHOSPHORUS: Phosphorus: 3.1 mg/dL (ref 2.5–4.6)

## 2021-04-16 LAB — MRSA PCR SCREENING: MRSA by PCR: NEGATIVE

## 2021-04-16 LAB — PROCALCITONIN: Procalcitonin: 0.27 ng/mL

## 2021-04-16 LAB — MAGNESIUM: Magnesium: 1.9 mg/dL (ref 1.7–2.4)

## 2021-04-16 MED ORDER — APIXABAN 2.5 MG PO TABS: 2.5000 mg | ORAL_TABLET | Freq: Two times a day (BID) | ORAL | Status: DC

## 2021-04-16 MED ORDER — SODIUM CHLORIDE 0.9 % IV SOLN
500.0000 mg | INTRAVENOUS | Status: DC
  Administered 2021-04-16 – 2021-04-17 (×2): 500 mg via INTRAVENOUS
  Filled 2021-04-16 (×2): qty 500

## 2021-04-16 MED ORDER — SODIUM CHLORIDE 0.9 % IV SOLN
1.0000 g | INTRAVENOUS | Status: DC
  Administered 2021-04-16 – 2021-04-17 (×2): 1 g via INTRAVENOUS
  Filled 2021-04-16 (×2): qty 10

## 2021-04-16 MED ORDER — SODIUM BICARBONATE 8.4 % IV SOLN
INTRAVENOUS | Status: AC
  Filled 2021-04-16 (×2): qty 1000

## 2021-04-16 MED ORDER — IPRATROPIUM BROMIDE 0.02 % IN SOLN: 0.5000 mg | Freq: Four times a day (QID) | RESPIRATORY_TRACT | Status: DC | PRN

## 2021-04-16 MED ORDER — ASPIRIN EC 81 MG PO TBEC
81.0000 mg | DELAYED_RELEASE_TABLET | Freq: Every day | ORAL | Status: DC
  Administered 2021-04-17 – 2021-04-18 (×2): 81 mg via ORAL
  Filled 2021-04-16 (×2): qty 1

## 2021-04-16 MED ORDER — LEVALBUTEROL HCL 0.63 MG/3ML IN NEBU: 0.6300 mg | INHALATION_SOLUTION | Freq: Four times a day (QID) | RESPIRATORY_TRACT | Status: DC | PRN

## 2021-04-16 MED ORDER — ENSURE ENLIVE PO LIQD
237.0000 mL | Freq: Two times a day (BID) | ORAL | Status: DC
  Administered 2021-04-17 – 2021-04-18 (×3): 237 mL via ORAL

## 2021-04-16 MED ORDER — GUAIFENESIN ER 600 MG PO TB12
1200.0000 mg | ORAL_TABLET | Freq: Two times a day (BID) | ORAL | Status: DC
  Administered 2021-04-16 – 2021-04-18 (×5): 1200 mg via ORAL
  Filled 2021-04-16 (×5): qty 2

## 2021-04-16 MED ORDER — ONDANSETRON HCL 4 MG/2ML IJ SOLN
4.0000 mg | Freq: Four times a day (QID) | INTRAMUSCULAR | Status: DC | PRN
  Administered 2021-04-16: 4 mg via INTRAVENOUS
  Filled 2021-04-16: qty 2

## 2021-04-16 MED ORDER — PANTOPRAZOLE SODIUM 40 MG PO TBEC
40.0000 mg | DELAYED_RELEASE_TABLET | Freq: Every day | ORAL | Status: DC
  Administered 2021-04-16 – 2021-04-18 (×3): 40 mg via ORAL
  Filled 2021-04-16 (×4): qty 1

## 2021-04-16 MED ORDER — DM-GUAIFENESIN ER 30-600 MG PO TB12: 1.0000 | ORAL_TABLET | Freq: Two times a day (BID) | ORAL | Status: DC

## 2021-04-16 NOTE — Plan of Care (Signed)
  Problem: Acute Rehab PT Goals(only PT should resolve) Goal: Pt Will Go Supine/Side To Sit Outcome: Progressing Flowsheets (Taken 04/16/2021 1543) Pt will go Supine/Side to Sit:  with supervision  with min guard assist Goal: Patient Will Transfer Sit To/From Stand Outcome: Progressing Flowsheets (Taken 04/16/2021 1543) Patient will transfer sit to/from stand:  with min guard assist  with minimal assist Goal: Pt Will Transfer Bed To Chair/Chair To Bed Outcome: Progressing Flowsheets (Taken 04/16/2021 1543) Pt will Transfer Bed to Chair/Chair to Bed:  with min assist  with mod assist Goal: Pt Will Ambulate Outcome: Progressing Flowsheets (Taken 04/16/2021 1543) Pt will Ambulate:  25 feet  with minimal assist  with rolling walker   3:43 PM, 04/16/21 Lonell Grandchild, MPT Physical Therapist with [facility] [phone] office [phone] mobile phone

## 2021-04-16 NOTE — Evaluation (Signed)
Physical Therapy Evaluation Patient Details Name: Tony Holt MRN: [id number] DOB: 12-Jul-1926 Today's Date: 04/16/2021   History of Present Illness  Tony Holt is a 87 y.o. male with medical history significant for GERD, hypertension, BPH, chronic atrial fibrillation, prostate cancer, cecal adenocarcinoma (diagnosed-February 2021) and CKD stage IIIA who presents to the emergency department due to cough, shortness of breath and body aches.  Patient was unable to provide history, history was obtained from ED physician and daughter at bedside.  Per daughter, patient developed cough, chest congestion about a week ago, but this has since progressed to increased generalized weakness and fatigue.  He went to his primary care physician yesterday and was diagnosed to have pneumonia, intramuscular antibiotic was given, patient returned to the PCP for follow-up, on seeing that patient's patient continued to worsen, he was asked to go to the ED for further evaluation.  There was no reported fever, nausea or vomiting.    Clinical Impression  Patient demonstrates slow labored movement for sitting up at bedside, very unsteady on feet and at high risk for falls using RW, limited to a few side steps before having to sit due to fatigue.  Patient tolerated sitting up in chair after therapy with his daughter present in room - RN notified.  Patient will benefit from continued physical therapy in hospital and recommended venue below to increase strength, balance, endurance for safe ADLs and gait.      Follow Up Recommendations SNF;Supervision for mobility/OOB;Supervision - Intermittent    Equipment Recommendations  None recommended by PT    Recommendations for Other Services       Precautions / Restrictions Restrictions Weight Bearing Restrictions: No      Mobility  Bed Mobility Overal bed mobility: Needs Assistance Bed Mobility: Supine to Sit     Supine to sit: Min assist;Mod assist      General bed mobility comments: increased time, labored movement    Transfers Overall transfer level: Needs assistance Equipment used: Rolling walker (2 wheeled) Transfers: Sit to/from [facility] Sit to Stand: Mod assist;Min assist Stand pivot transfers: Mod assist       General transfer comment: slow labored movement, has to lean over RW due to generalized weakness, kyphotic trunk  Ambulation/Gait Ambulation/Gait assistance: Mod assist Gait Distance (Feet): 5 Feet Assistive device: Rolling walker (2 wheeled) Gait Pattern/deviations: Decreased step length - left;Decreased step length - right;Decreased stride length;Trunk flexed Gait velocity: decreased   General Gait Details: limited to a few side steps at bedside due to generalized weakness and poor standing balance  Stairs            Wheelchair Mobility    Modified Rankin (Stroke Patients Only)       Balance Overall balance assessment: Needs assistance Sitting-balance support: Feet supported;No upper extremity supported Sitting balance-Leahy Scale: Fair Sitting balance - Comments: seated at EOB   Standing balance support: During functional activity;Bilateral upper extremity supported Standing balance-Leahy Scale: Poor Standing balance comment: fair/poor using RW                             Pertinent Vitals/Pain Pain Assessment: 0-10 Pain Score: 7  Pain Location: stomach Pain Descriptors / Indicators: Aching Pain Intervention(s): Limited activity within patient's tolerance;Monitored during session    Home Living Family/patient expects to be discharged to:: Private residence Living Arrangements: Children Available Help at Discharge: Family;Available 24 hours/day Type of Home: House Home Access: Level entry  Home Layout: One level Home Equipment: Cane - single point;Shower seat;Bedside commode;Walker - 2 wheels;Walker - 4 wheels;Grab bars - tub/shower      Prior  Function Level of Independence: Needs assistance   Gait / Transfers Assistance Needed: household ambulator using RW, recently having to use Rollator  ADL's / Homemaking Assistance Needed: assisted by family        Hand Dominance        Extremity/Trunk Assessment   Upper Extremity Assessment Upper Extremity Assessment: Generalized weakness    Lower Extremity Assessment Lower Extremity Assessment: Generalized weakness    Cervical / Trunk Assessment Cervical / Trunk Assessment: Kyphotic  Communication   Communication: HOH  Cognition Arousal/Alertness: Awake/alert Behavior During Therapy: WFL for tasks assessed/performed Overall Cognitive Status: Within Functional Limits for tasks assessed                                        General Comments      Exercises     Assessment/Plan    PT Assessment Patient needs continued PT services  PT Problem List Decreased strength;Decreased activity tolerance;Decreased balance;Decreased mobility       PT Treatment Interventions DME instruction;Gait training;Stair training;Functional mobility training;Therapeutic activities;Therapeutic exercise;Balance training;Patient/family education    PT Goals (Current goals can be found in the Care Plan section)  Acute Rehab PT Goals Patient Stated Goal: return home with family to assist PT Goal Formulation: With patient/family Time For Goal Achievement: 04/30/21 Potential to Achieve Goals: Good    Frequency Min 3X/week   Barriers to discharge        Co-evaluation               AM-PAC PT "6 Clicks" Mobility  Outcome Measure Help needed turning from your back to your side while in a flat bed without using bedrails?: A Little Help needed moving from lying on your back to sitting on the side of a flat bed without using bedrails?: A Lot Help needed moving to and from a bed to a chair (including a wheelchair)?: A Lot Help needed standing up from a chair using your  arms (e.g., wheelchair or bedside chair)?: A Lot Help needed to walk in hospital room?: A Lot Help needed climbing 3-5 steps with a railing? : Total 6 Click Score: 12    End of Session   Activity Tolerance: Patient tolerated treatment well;Patient limited by fatigue Patient left: in chair;with call bell/phone within reach;with chair alarm set;with family/visitor present Nurse Communication: Mobility status PT Visit Diagnosis: Unsteadiness on feet (R26.81);Other abnormalities of gait and mobility (R26.89)    Time: 2841-3244 PT Time Calculation (min) (ACUTE ONLY): 26 min   Charges:   PT Evaluation $PT Eval Moderate Complexity: 1 Mod PT Treatments $Therapeutic Activity: 23-37 mins        3:41 PM, 04/16/21 Lonell Grandchild, MPT Physical Therapist with [facility] [phone] office [phone] mobile phone

## 2021-04-16 NOTE — TOC Initial Note (Signed)
Transition of Care [facility]) - Initial/Assessment Note    Patient Details  Name: FRANCISZEK PLATTEN MRN: [id number] Date of Birth: [birth year]  Transition of Care [facility]) CM/SW Contact:    Iona Beard, [place] Phone Number: 04/16/2021, 3:57 PM  Clinical Narrative:                 TOC updated that PT is recommending SNF for pt. CSW reached out to pts daughter Dione Housekeeper to complete pts assessment. Per [name] pt lives with her. Pt has previously been independent in completing ADLs however since he has been sick he has been in need of some assistance. Pt does not drive but his daughter provides transportation when needed. Pt has not had [place] services before. Pt has a walker to use in the home and a rollator when he leaves the home. CSW informed [name] of PT recommendation of of SNF for pt at d/c. [name] states that pt will not agree to going to SNF he will only want to return home as he will not want to leave his dog. CSW informed [name] that we can set up [facility] services for pt at d/c. CSW inquired about preference in [facility] agency, [name] does not have a preference. CSW reached out to [place] with [facility] who states he will review the referral and let CSW know if they can accept pt. TOC to follow.   Expected Discharge Plan: [place] Barriers to Discharge: Continued Medical Work up   Patient Goals and CMS Choice Patient states their goals for this hospitalization and ongoing recovery are:: Home with [facility] CMS Medicare.gov Compare Post Acute Care list provided to:: Patient Choice offered to / list presented to : [place]  Expected Discharge Plan and Services Expected Discharge Plan: [place] Acute Care Choice: [place] arrangements for the past 2 months: Single Family Home                                      Prior Living Arrangements/Services Living arrangements for the past 2 months: Single Family Home Lives with:: Adult  Children Patient language and need for interpreter reviewed:: Yes Do you feel safe going back to the place where you live?: Yes      Need for Family Participation in Patient Care: Yes (Comment) Care giver support system in place?: Yes (comment) Current home services: DME Criminal Activity/Legal Involvement Pertinent to Current Situation/Hospitalization: No - Comment as needed  Activities of Daily Living Home Assistive Devices/Equipment: Walker (specify type),Shower chair with back,Bedside commode/3-in-1 ADL Screening (condition at time of admission) Patient's cognitive ability adequate to safely complete daily activities?: Yes Is the patient deaf or have difficulty hearing?: Yes Does the patient have difficulty seeing, even when wearing glasses/contacts?: No Does the patient have difficulty concentrating, remembering, or making decisions?: Yes Patient able to express need for assistance with ADLs?: Yes Does the patient have difficulty dressing or bathing?: Yes Independently performs ADLs?: No Communication: Independent Dressing (OT): Independent Grooming: Independent Feeding: Independent Bathing: Needs assistance Is this a change from baseline?: Pre-admission baseline Toileting: Needs assistance Is this a change from baseline?: Pre-admission baseline In/Out Bed: Independent Walks in Home: Independent with device (comment) Does the patient have difficulty walking or climbing stairs?: Yes Weakness of Legs: Both Weakness of Arms/Hands: None  Permission Sought/Granted  Emotional Assessment Appearance:: Appears stated age     Orientation: : Oriented to Self,Oriented to Place,Oriented to  Time,Oriented to Situation Alcohol / Substance Use: Not Applicable Psych Involvement: No (comment)  Admission diagnosis:  Shortness of breath [R06.02] Dehydration [E86.0] Positive D dimer [R79.89] Dyspnea, unspecified type [R06.00] Patient Active Problem List    Diagnosis Date Noted  . Elevated d-dimer 04/16/2021  . Elevated troponin 04/16/2021  . Elevated brain natriuretic peptide (BNP) level 04/16/2021  . Shortness of breath 04/15/2021  . COVID-19 virus infection 11/19/2020  . GERD without esophagitis 11/19/2020  . Acute metabolic encephalopathy 16/09/9603  . Protein-calorie malnutrition ([place]) 11/19/2020  . BPH without obstruction/lower urinary tract symptoms 10/15/2020  . Nocturia 10/15/2020  . Pelvis fracture, right, closed, initial encounter ([place]) 10/01/2020  . Lobar pneumonia ([place]) 09/03/2020  . Acute respiratory failure with hypoxia ([place]) 09/03/2020  . Acute renal failure superimposed on stage 3a chronic kidney disease ([place]) 09/03/2020  . Respiratory failure with hypoxia ([place]) 09/02/2020  . CAP (community acquired pneumonia)   . Elevated PSA 04/28/2020  . AKI (acute kidney injury) ([place]) 02/23/2020  . Prostate cancer ([place]) 02/22/2020  . Atrial fibrillation, chronic ([place]) 02/22/2020  . Cecal cancer ([place])   . Iron deficiency anemia due to chronic blood loss 05/17/2019  . Constipation 05/17/2019  . Bradycardia 05/28/2016  . Generalized weakness 05/28/2016  . Acute kidney injury superimposed on CKD ([place]) 05/28/2016  . Hyperkalemia 05/28/2016  . Nausea alone 03/24/2012  . Abdominal pain, epigastric 03/24/2012  . Essential hypertension 03/24/2012  . ANEMIA 09/16/2010  . HEMOCCULT POSITIVE STOOL 09/16/2010   PCP:  [name], MD Pharmacy:   [id number] - [place] [place] [address] Phone: [phone] Fax: [phone]     Social Determinants of Health (SDOH) Interventions    Readmission Risk Interventions Readmission Risk Prevention Plan 02/07/2020  Transportation Screening Complete  PCP or Specialist Appt within 3-5 Days Not Complete  Not Complete comments office not accepting voicemails. states they will reopen tomorrow at 10.  [place] or Home Care  Consult Complete  Social Work Consult for [place] Planning/Counseling Complete  Palliative Care Screening Not Applicable  Medication Review Press photographer) Complete  Some recent data might be hidden

## 2021-04-16 NOTE — Progress Notes (Signed)
PROGRESS NOTE    Tony Holt  NOI:[id number] DOB: 1926/11/16 DOA: 04/15/2021 PCP: [name], MD   Brief Narrative:  The patient is a 87 year old Caucasian male with a past medical history significant for but not limited to hard of hearing, GERD, hypertension, BPH, chronic atrial fibrillation not previously on anticoagulation, history of prostate cancer, cecal adenocarcinoma which was diagnosed in February 2021, and history of CKD stage IIIa as well as other comorbidities who presented to the ED with a chief complaint of a cough, shortness of breath and body aches.  Patient unable to provide a subjective history and history is obtained from the ED physician and daughter.  Patient's daughter stated that patient developed a cough and chest congestion about a week ago and this has progressed to increase generalized weakness and fatigue.  He went to his PCP the day before yesterday and diagnosed with a pneumonia and given intramuscular antibiotic and then he returned to PCP for follow-up and monitoring the patient patient continued to worsen and he was asked to go to the ED for evaluation.  In the ED he is initially tachycardic but otherwise vital signs within normal limits.  He did have a slight AKI and SARS-CoV-2 testing and influenza A and B are negative.  Chest x-ray showed no active disease and a VQ scan done showed no evidence to strongly suggest the presence of PE.  He was given IV hydration and initiated on antibiotics and admitted for suspected community-acquired pneumonia  Assessment & Plan:   Active Problems:   Essential hypertension   Generalized weakness   Acute kidney injury superimposed on CKD (HCC)   Cecal cancer (HCC)   Prostate cancer ([place])   Atrial fibrillation, chronic (HCC)   CAP (community acquired pneumonia)   GERD without esophagitis   Shortness of breath   Elevated d-dimer   Elevated troponin   Elevated brain natriuretic peptide (BNP) level  Shortness of  breath and cough secondary to presumed community-acquired pneumonia, present on admission -Patient presented with a dry cough and chest congestion however he had no fever or elevated WBC -Initial chest x-ray showed no acute findings -This could be viral in nature however procalcitonin level was checked and was 0.27 we will start the patient on IV ceftriaxone and azithromycin for possible superimposed bacterial pneumonia -Initiated on Mucinex and changed to scheduled, will continue incentive spirometry and flutter valve -Started the patient on breathing treatments with Xopenex/Atrovent q6h Wheezing and SOB -SpO2: 97 %; Not requiring Supplemental O2  -Check Ambulatory Home O2 Screen and repeat CXR in the AM -Consider CT Chest  -Could be POST-COVID Syndrome  -PT/OT recommending SNF  Generalized Weakness  -In the setting of Above -PT/OT recommending SNF -C/w Fall Precautions and Neuro-Checks  AKI on CKD Stage 3b -Presented with a BUN/Cr of 47/2.03; Has a Baseline Cr of 1.3-1.5 -Getting Gentle IVF Hydration and BUN/Cr is improved. BUN/Cr is now 38/1.53 -Avoid Nephrotoxic Medications, Contrast Dyes, Hypotension -Continue to Monitor and Trend and Repeat CMP in the AM   Normocytic Anemia/Anemia of Chronic Kidney Disease -Patient's Hgb/Hct went from 12.2/37.9 -> 11.3/34.8 -Likley dilutional Drop -Check Anemia Panel in the AM -Continue to Monitor for S/Sx of Bleeding; No overt bleeding noted -Repeat CBC in the AM   Atrial Fibrillation, Likely Chronic -EKG showes A Fib with Rate control -Resumed Eliquis but reduced dose to 2.5 mg po BID but upon further review there was prior discussion about AC vs ASA and daughter had agreed to ASA 81 mg  Daily; Will stop AC and resume ASA 81 mg given his Chronic Anemia  -Does not appear to be on any Rate Controlling Medications  -If Necessary move to Telemetry   Essential HTN -Lasix is being held due to AKI -Continue to Monitor BP per Protocol -Last BP  was 113/70  Elevated D-Dimer -VQ showed no evidence of PE -Initiated on [facility] with Apixaban  Elevated Troponin and likely demand ischemia from SOB -Troponin x2 Flat at 29 -Patient denies CP  GERD -C/w Pantoprazole  Hx of Colon Cancer and Prostate Cancer -Follows with Dr. Delton Coombes for Cecal Cancer and [facility]  BPH -Resume Tamsulosin   DVT prophylaxis: Anticoagulated with Apixaban Code Status: DO NOT RESUSCITATE  Family Communication: No family present at bedside  Disposition Plan: Pending further clinical improvement; PT/OT recommending SNF  Status is: Observation  The patient will require care spanning > 2 midnights and should be moved to inpatient because: Unsafe d/c plan, IV treatments appropriate due to intensity of illness or inability to take PO and Inpatient level of care appropriate due to severity of illness  Dispo: The patient is from: Home              Anticipated d/c is to: SNF              Patient currently is not medically stable to d/c.   Difficult to place patient No  Consultants:   None  Procedures: None  Antimicrobials:  Anti-infectives (From admission, onward)   Start     Dose/Rate Route Frequency Ordered Stop   04/16/21 1630  cefTRIAXone (ROCEPHIN) 1 g in sodium chloride 0.9 % 100 mL IVPB        1 g 200 mL/hr over 30 Minutes Intravenous Every 24 hours 04/16/21 1540     04/16/21 1630  azithromycin (ZITHROMAX) 500 mg in sodium chloride 0.9 % 250 mL IVPB        500 mg 250 mL/hr over 60 Minutes Intravenous Every 24 hours 04/16/21 1540          Subjective: Seen and examined at bedside and he is extremely hard of hearing.  Denies any nausea or vomiting.  Denies lightheadedness or dizziness.  Feels okay.  Still feels a little short of breath.  No other concerns or plans at this time.  Objective: Vitals:   04/15/21 2321 04/16/21 0319 04/16/21 0508 04/16/21 1325  BP: (!) 155/77 125/79 126/78 113/70  Pulse: 92 86 92 81  Resp: 16  16 18 19   Temp: 97.8 F (36.6 C) 97.6 F (36.4 C) (!) 97.3 F (36.3 C)   TempSrc:      SpO2: 96% 98% 98% 97%  Weight:      Height:        Intake/Output Summary (Last 24 hours) at 04/16/2021 1701 Last data filed at 04/16/2021 1500 Gross per 24 hour  Intake 2311.68 ml  Output 400 ml  Net 1911.68 ml   Filed Weights   04/15/21 1406  Weight: 62.6 kg   Examination: Physical Exam:  Constitutional: Thin chronically ill-appearing elderly Caucasian male currently in no acute distress Eyes: Lids and conjunctivae normal, sclerae anicteric  ENMT: External Ears, Nose appear normal. Grossly normal hearing. Neck: Appears normal, supple, no cervical masses, normal ROM, no appreciable thyromegaly; no JVD Respiratory: Diminished to auscultation bilaterally with coarse breath sound, no wheezing, rales, rhonchi or crackles. Normal respiratory effort and patient is not tachypenic. No accessory muscle use.  Not wearing supplemental oxygen via nasal cannula Cardiovascular:  Irregularly irregular but not tachycardic, no murmurs / rubs / gallops. Has minimal extremity edema Abdomen: Soft, non-tender, non-distended. Bowel sounds positive.  GU: Deferred. Musculoskeletal: No clubbing / cyanosis of digits/nails. No joint deformity upper and lower extremities.  Skin: No rashes, lesions, ulcers. No induration; Warm and dry.  Neurologic: CN 2-12 grossly intact with no focal deficits. Romberg sign and cerebellar reflexes not assessed.  Psychiatric: Normal judgment and insight. Alert and oriented x 3. Normal mood and appropriate affect.   Data Reviewed: I have personally reviewed following labs and imaging studies  CBC: Recent Labs  Lab 04/15/21 1504 04/16/21 0510  WBC 8.8 9.2  NEUTROABS 6.5  --   HGB 12.2* 11.3*  HCT 37.9* 34.8*  MCV 94.0 93.5  PLT 334 [id number]   Basic Metabolic Panel: Recent Labs  Lab 04/15/21 1504 04/16/21 0510  NA 135 133*  K 4.9 4.2  CL 107 110  CO2 19* 15*  GLUCOSE 99 85  BUN  47* 38*  CREATININE 2.03* 1.53*  CALCIUM 10.1 9.3  MG  --  1.9  PHOS  --  3.1   GFR: Estimated Creatinine Clearance: 27.3 mL/min (A) (by C-G formula based on SCr of 1.53 mg/dL (H)). Liver Function Tests: Recent Labs  Lab 04/15/21 1504 04/16/21 0510  AST 21 14*  ALT 28 21  ALKPHOS 61 52  BILITOT 0.7 0.9  PROT 7.2 6.2*  ALBUMIN 3.4* 3.0*   No results for input(s): LIPASE, AMYLASE in the last 168 hours. No results for input(s): AMMONIA in the last 168 hours. Coagulation Profile: Recent Labs  Lab 04/16/21 0510  INR 1.6*   Cardiac Enzymes: No results for input(s): CKTOTAL, CKMB, CKMBINDEX, TROPONINI in the last 168 hours. BNP (last 3 results) No results for input(s): PROBNP in the last 8760 hours. HbA1C: No results for input(s): HGBA1C in the last 72 hours. CBG: No results for input(s): GLUCAP in the last 168 hours. Lipid Profile: No results for input(s): CHOL, HDL, LDLCALC, TRIG, CHOLHDL, LDLDIRECT in the last 72 hours. Thyroid Function Tests: No results for input(s): TSH, T4TOTAL, FREET4, T3FREE, THYROIDAB in the last 72 hours. Anemia Panel: No results for input(s): VITAMINB12, FOLATE, FERRITIN, TIBC, IRON, RETICCTPCT in the last 72 hours. Sepsis Labs: Recent Labs  Lab 04/16/21 0510  PROCALCITON 0.27    Recent Results (from the past 240 hour(s))  Resp Panel by RT-PCR (Flu A&B, Covid) Nasopharyngeal Swab     Status: None   Collection Time: 04/15/21  5:03 PM   Specimen: Nasopharyngeal Swab; Nasopharyngeal(NP) swabs in vial transport medium  Result Value Ref Range Status   SARS Coronavirus 2 by RT PCR NEGATIVE NEGATIVE Final    Comment: (NOTE) SARS-CoV-2 target nucleic acids are NOT DETECTED.  The SARS-CoV-2 RNA is generally detectable in upper respiratory specimens during the acute phase of infection. The lowest concentration of SARS-CoV-2 viral copies this assay can detect is 138 copies/mL. A negative result does not preclude SARS-Cov-2 infection and should  not be used as the sole basis for treatment or other patient management decisions. A negative result may occur with  improper specimen collection/handling, submission of specimen other than nasopharyngeal swab, presence of viral mutation(s) within the areas targeted by this assay, and inadequate number of viral copies(<138 copies/mL). A negative result must be combined with clinical observations, patient history, and epidemiological information. The expected result is Negative.  Fact Sheet for Patients:  EntrepreneurPulse.com.au  Fact Sheet for Healthcare Providers:  IncredibleEmployment.be  This test is no t yet approved  or cleared by the [place] and  has been authorized for detection and/or diagnosis of SARS-CoV-2 by FDA under an Emergency Use Authorization (EUA). This EUA will remain  in effect (meaning this test can be used) for the duration of the COVID-19 declaration under Section 564(b)(1) of the Act, 21 U.S.C.section 360bbb-3(b)(1), unless the authorization is terminated  or revoked sooner.       Influenza A by PCR NEGATIVE NEGATIVE Final   Influenza B by PCR NEGATIVE NEGATIVE Final    Comment: (NOTE) The Xpert Xpress SARS-CoV-2/FLU/RSV plus assay is intended as an aid in the diagnosis of influenza from Nasopharyngeal swab specimens and should not be used as a sole basis for treatment. Nasal washings and aspirates are unacceptable for Xpert Xpress SARS-CoV-2/FLU/RSV testing.  Fact Sheet for Patients: EntrepreneurPulse.com.au  Fact Sheet for Healthcare Providers: IncredibleEmployment.be  This test is not yet approved or cleared by the [place] FDA and has been authorized for detection and/or diagnosis of SARS-CoV-2 by FDA under an Emergency Use Authorization (EUA). This EUA will remain in effect (meaning this test can be used) for the duration of the COVID-19 declaration under  Section 564(b)(1) of the Act, 21 U.S.C. section 360bbb-3(b)(1), unless the authorization is terminated or revoked.  Performed at [facility], [address] [place], [place] [address]   MRSA PCR Screening     Status: None   Collection Time: 04/16/21 10:27 AM   Specimen: Nasopharyngeal  Result Value Ref Range Status   MRSA by PCR NEGATIVE NEGATIVE Final    Comment:        The GeneXpert MRSA Assay (FDA approved for NASAL specimens only), is one component of a comprehensive MRSA colonization surveillance program. It is not intended to diagnose MRSA infection nor to guide or monitor treatment for MRSA infections. Performed at [facility], [address] [place], [place] [address]      RN Pressure Injury Documentation:     Estimated body mass index is 20.38 kg/m as calculated from the following:   Height as of this encounter: 5\' 9"  (1.753 m).   Weight as of this encounter: 62.6 kg.  Malnutrition Type:    Malnutrition Characteristics:   Nutrition Interventions:   Radiology Studies: NM Pulmonary Perfusion  Result Date: 04/15/2021 CLINICAL DATA:  Shortness of breath x 1 month. EXAM: NUCLEAR MEDICINE PERFUSION LUNG SCAN TECHNIQUE: Perfusion images were obtained in multiple projections after intravenous injection of radiopharmaceutical. Ventilation scans intentionally deferred if perfusion scan and chest x-ray adequate for interpretation during COVID 19 epidemic. RADIOPHARMACEUTICALS:  4.4 mCi Tc-60m MAA IV COMPARISON:  None. FINDINGS: Markedly limited study secondary to positioning of the patient's upper extremities as well as the inability of the patient to lay flat on his back. Diffuse, heterogeneously decreased tracer uptake is noted involving the upper lobes of both lungs, however, this is likely, in part, secondary to the previously noted study limitations. No segmental or subsegmental focal perfusion defects are identified. IMPRESSION: 1. Markedly limited study, as described  above, without evidence to strongly suggest the presence of pulmonary embolism. Electronically Signed   By: Virgina Norfolk M.D.   On: 04/15/2021 22:19   DG Chest Portable 1 View  Result Date: 04/15/2021 CLINICAL DATA:  Weakness and shortness of breath. EXAM: PORTABLE CHEST 1 VIEW COMPARISON:  PA and lateral chest 01/28/2021 and 12/01/2020. CT chest 04/07/2021. FINDINGS: Lung volumes are somewhat low with mild basilar atelectasis, worse on the left. No pneumothorax or pleural fluid. Cardiomegaly. Aortic atherosclerosis. No acute  or focal bony abnormality. IMPRESSION: No acute disease. Cardiomegaly. Aortic Atherosclerosis (ICD10-I70.0). Electronically Signed   By: Inge Rise M.D.   On: 04/15/2021 15:52   Scheduled Meds: . apixaban  2.5 mg Oral BID  . feeding supplement  237 mL Oral BID BM  . guaiFENesin  1,200 mg Oral BID  . pantoprazole  40 mg Oral Daily   Continuous Infusions: . azithromycin    . cefTRIAXone (ROCEPHIN)  IV    . sodium bicarbonate 150 mEq in D5W infusion 75 mL/hr at 04/16/21 0959    LOS: 0 days   Kerney Elbe, DO [facility] PAGER is on [place]  If 7PM-7AM, please contact night-coverage [email or web address]

## 2021-04-17 ENCOUNTER — Observation Stay (HOSPITAL_COMMUNITY): Payer: Medicare HMO

## 2021-04-17 DIAGNOSIS — J40 Bronchitis, not specified as acute or chronic: Secondary | ICD-10-CM

## 2021-04-17 DIAGNOSIS — D72829 Elevated white blood cell count, unspecified: Secondary | ICD-10-CM

## 2021-04-17 DIAGNOSIS — J189 Pneumonia, unspecified organism: Secondary | ICD-10-CM | POA: Diagnosis not present

## 2021-04-17 DIAGNOSIS — C180 Malignant neoplasm of cecum: Secondary | ICD-10-CM | POA: Diagnosis not present

## 2021-04-17 DIAGNOSIS — N179 Acute kidney failure, unspecified: Secondary | ICD-10-CM | POA: Diagnosis not present

## 2021-04-17 DIAGNOSIS — E871 Hypo-osmolality and hyponatremia: Secondary | ICD-10-CM

## 2021-04-17 DIAGNOSIS — E876 Hypokalemia: Secondary | ICD-10-CM

## 2021-04-17 DIAGNOSIS — I482 Chronic atrial fibrillation, unspecified: Secondary | ICD-10-CM | POA: Diagnosis not present

## 2021-04-17 LAB — CBC WITH DIFFERENTIAL/PLATELET
Basophils Absolute: 0 10*3/uL (ref 0.0–0.1)
Basophils Absolute: 0 10*3/uL (ref 0.0–0.1)
Basophils Relative: 0 %
Basophils Relative: 0 %
Eosinophils Absolute: 0 10*3/uL (ref 0.0–0.5)
Eosinophils Absolute: 0 10*3/uL (ref 0.0–0.5)
Eosinophils Relative: 0 %
Eosinophils Relative: 0 %
HCT: 33.5 % — ABNORMAL LOW (ref 39.0–52.0)
HCT: 38.3 % — ABNORMAL LOW (ref 39.0–52.0)
Hemoglobin: 11.3 g/dL — ABNORMAL LOW (ref 13.0–17.0)
Hemoglobin: 12.4 g/dL — ABNORMAL LOW (ref 13.0–17.0)
Lymphocytes Relative: 6 %
Lymphocytes Relative: 3 %
Lymphs Abs: 0.7 10*3/uL (ref 0.7–4.0)
Lymphs Abs: 0.4 10*3/uL — ABNORMAL LOW (ref 0.7–4.0)
MCH: 31 pg (ref 26.0–34.0)
MCH: 30.3 pg (ref 26.0–34.0)
MCHC: 33.7 g/dL (ref 30.0–36.0)
MCHC: 32.4 g/dL (ref 30.0–36.0)
MCV: 91.8 fL (ref 80.0–100.0)
MCV: 93.6 fL (ref 80.0–100.0)
Metamyelocytes Relative: 2 %
Metamyelocytes Relative: 5 %
Monocytes Absolute: 0.7 10*3/uL (ref 0.1–1.0)
Monocytes Absolute: 0.6 10*3/uL (ref 0.1–1.0)
Monocytes Relative: 6 %
Monocytes Relative: 5 %
Myelocytes: 2 %
Neutro Abs: 10.6 10*3/uL — ABNORMAL HIGH (ref 1.7–7.7)
Neutro Abs: 10.8 10*3/uL — ABNORMAL HIGH (ref 1.7–7.7)
Neutrophils Relative %: 86 %
Neutrophils Relative %: 84 %
Platelets: 331 10*3/uL (ref 150–400)
Platelets: 343 10*3/uL (ref 150–400)
Promyelocytes Relative: 1 %
RBC: 3.65 MIL/uL — ABNORMAL LOW (ref 4.22–5.81)
RBC: 4.09 MIL/uL — ABNORMAL LOW (ref 4.22–5.81)
RDW: 13.9 % (ref 11.5–15.5)
RDW: 14.1 % (ref 11.5–15.5)
WBC: 12.3 10*3/uL — ABNORMAL HIGH (ref 4.0–10.5)
WBC: 12.9 10*3/uL — ABNORMAL HIGH (ref 4.0–10.5)
nRBC: 0 % (ref 0.0–0.2)
nRBC: 0 % (ref 0.0–0.2)

## 2021-04-17 LAB — COMPREHENSIVE METABOLIC PANEL
ALT: 19 U/L (ref 0–44)
ALT: 24 U/L (ref 0–44)
AST: 17 U/L (ref 15–41)
AST: 23 U/L (ref 15–41)
Albumin: 2.9 g/dL — ABNORMAL LOW (ref 3.5–5.0)
Albumin: 3.2 g/dL — ABNORMAL LOW (ref 3.5–5.0)
Alkaline Phosphatase: 48 U/L (ref 38–126)
Alkaline Phosphatase: 57 U/L (ref 38–126)
Anion gap: 7 (ref 5–15)
Anion gap: 10 (ref 5–15)
BUN: 24 mg/dL — ABNORMAL HIGH (ref 8–23)
BUN: 25 mg/dL — ABNORMAL HIGH (ref 8–23)
CO2: 23 mmol/L (ref 22–32)
CO2: 23 mmol/L (ref 22–32)
Calcium: 8.9 mg/dL (ref 8.9–10.3)
Calcium: 9 mg/dL (ref 8.9–10.3)
Chloride: 104 mmol/L (ref 98–111)
Chloride: 101 mmol/L (ref 98–111)
Creatinine, Ser: 1.37 mg/dL — ABNORMAL HIGH (ref 0.61–1.24)
Creatinine, Ser: 1.43 mg/dL — ABNORMAL HIGH (ref 0.61–1.24)
GFR, Estimated: 48 mL/min — ABNORMAL LOW (ref >60)
GFR, Estimated: 46 mL/min — ABNORMAL LOW (ref >60)
Glucose, Bld: 117 mg/dL — ABNORMAL HIGH (ref 70–99)
Glucose, Bld: 122 mg/dL — ABNORMAL HIGH (ref 70–99)
Potassium: 3.4 mmol/L — ABNORMAL LOW (ref 3.5–5.1)
Potassium: 3.7 mmol/L (ref 3.5–5.1)
Sodium: 134 mmol/L — ABNORMAL LOW (ref 135–145)
Sodium: 134 mmol/L — ABNORMAL LOW (ref 135–145)
Total Bilirubin: 0.7 mg/dL (ref 0.3–1.2)
Total Bilirubin: 0.6 mg/dL (ref 0.3–1.2)
Total Protein: 6 g/dL — ABNORMAL LOW (ref 6.5–8.1)
Total Protein: 6.4 g/dL — ABNORMAL LOW (ref 6.5–8.1)

## 2021-04-17 LAB — PROCALCITONIN: Procalcitonin: 0.1 ng/mL

## 2021-04-17 LAB — MAGNESIUM: Magnesium: 1.7 mg/dL (ref 1.7–2.4)

## 2021-04-17 LAB — PHOSPHORUS: Phosphorus: 2.4 mg/dL — ABNORMAL LOW (ref 2.5–4.6)

## 2021-04-17 MED ORDER — CLONAZEPAM 0.5 MG PO TABS
0.5000 mg | ORAL_TABLET | Freq: Every day | ORAL | Status: DC
  Administered 2021-04-17: 0.5 mg via ORAL
  Filled 2021-04-17 (×2): qty 1

## 2021-04-17 MED ORDER — SODIUM BICARBONATE 8.4 % IV SOLN
INTRAVENOUS | Status: AC
  Filled 2021-04-17: qty 100

## 2021-04-17 MED ORDER — MAGNESIUM SULFATE 2 GM/50ML IV SOLN
2.0000 g | Freq: Once | INTRAVENOUS | Status: AC
  Administered 2021-04-17: 2 g via INTRAVENOUS
  Filled 2021-04-17: qty 50

## 2021-04-17 MED ORDER — ADULT MULTIVITAMIN W/MINERALS CH
1.0000 | ORAL_TABLET | Freq: Every day | ORAL | Status: DC
  Administered 2021-04-17 – 2021-04-18 (×2): 1 via ORAL
  Filled 2021-04-17 (×2): qty 1

## 2021-04-17 MED ORDER — K PHOS MONO-SOD PHOS DI & MONO 155-852-130 MG PO TABS
500.0000 mg | ORAL_TABLET | Freq: Once | ORAL | Status: AC
  Administered 2021-04-17: 500 mg via ORAL
  Filled 2021-04-17: qty 2

## 2021-04-17 MED ORDER — MIRABEGRON ER 25 MG PO TB24
50.0000 mg | ORAL_TABLET | Freq: Every day | ORAL | Status: DC
  Administered 2021-04-18: 50 mg via ORAL
  Filled 2021-04-17: qty 2

## 2021-04-17 MED ORDER — POTASSIUM CHLORIDE CRYS ER 20 MEQ PO TBCR
40.0000 meq | EXTENDED_RELEASE_TABLET | Freq: Once | ORAL | Status: AC
  Administered 2021-04-17: 40 meq via ORAL
  Filled 2021-04-17: qty 2

## 2021-04-17 MED ORDER — SODIUM BICARBONATE 8.4 % IV SOLN
INTRAVENOUS | Status: AC
  Filled 2021-04-17: qty 50

## 2021-04-17 MED ORDER — SODIUM CHLORIDE 0.9 % IV SOLN: INTRAVENOUS | Status: DC

## 2021-04-17 MED ORDER — TAMSULOSIN HCL 0.4 MG PO CAPS
0.4000 mg | ORAL_CAPSULE | Freq: Every day | ORAL | Status: DC
  Administered 2021-04-18: 0.4 mg via ORAL
  Filled 2021-04-17: qty 1

## 2021-04-17 NOTE — Progress Notes (Signed)
PROGRESS NOTE    Tony Holt  TDV:[id number] DOB: 1926/10/21 DOA: 04/15/2021 PCP: [name], MD   Brief Narrative:  The patient is a 87 year old Caucasian male with a past medical history significant for but not limited to hard of hearing, GERD, hypertension, BPH, chronic atrial fibrillation not previously on anticoagulation, history of prostate cancer, cecal adenocarcinoma which was diagnosed in February 2021, and history of CKD stage IIIa as well as other comorbidities who presented to the ED with a chief complaint of a cough, shortness of breath and body aches.  Patient unable to provide a subjective history and history is obtained from the ED physician and daughter.  Patient's daughter stated that patient developed a cough and chest congestion about a week ago and this has progressed to increase generalized weakness and fatigue.  He went to his PCP the day before yesterday and diagnosed with a pneumonia and given intramuscular antibiotic and then he returned to PCP for follow-up and monitoring the patient patient continued to worsen and he was asked to go to the ED for evaluation.  In the ED he is initially tachycardic but otherwise vital signs within normal limits.  He did have a slight AKI and SARS-CoV-2 testing and influenza A and B are negative.  Chest x-ray showed no active disease and a VQ scan done showed no evidence to strongly suggest the presence of PE.  He was given IV hydration and initiated on antibiotics and admitted for suspected community-acquired pneumonia but further workup shows it could be a chronic Bronchitis.  Patient is improving but WBC elevated and he had some electrolyte abnormalities. Will replete and repeat blood work. Patient evaluated by OT and he is improving from a strength standpoint. Anticiapting D/C in the AM if stable.   Assessment & Plan:   Active Problems:   Essential hypertension   Generalized weakness   Acute kidney injury superimposed on CKD  (HCC)   Cecal cancer (HCC)   Prostate cancer ([place])   Atrial fibrillation, chronic (HCC)   CAP (community acquired pneumonia)   GERD without esophagitis   Shortness of breath   Elevated d-dimer   Elevated troponin   Elevated brain natriuretic peptide (BNP) level  Shortness of breath and cough secondary to presumed community-acquired pneumonia but now likely from Chronic Bronchitis, present on admission -Patient presented with a dry cough and chest congestion however he had no fever or elevated WBC -Initial chest x-ray showed no acute findings -This could be viral in nature however procalcitonin level was checked and was 0.27 we will start the patient on IV ceftriaxone and azithromycin for possible superimposed bacterial pneumonia; Repeat PCT is pending -Initiated on Mucinex and changed to scheduled, will continue incentive spirometry and flutter valve -Started the patient on breathing treatments with Xopenex/Atrovent q6h Wheezing and SOB -SpO2: 100 %; Not requiring Supplemental O2  -Check Ambulatory Home O2 Screen and repeat CXR this AM showed "Interstitial thickening may indicate a degree of underlying chronic bronchitis. No edema or consolidation. Stable cardiac enlargement. Aortic  Atherosclerosis" -WBC is trending up and worsening and gone from 8.8 -> 9.2 -> 12.3 -> 12.9 -Consider CT Chest but will hold for now; IF WBC continues to worsen will obtain -Check Blood Cx x2 -Could be POST-COVID Syndrome  -PT/OT recommending SNF but daughter   Generalized Weakness, improving  -In the setting of Above -PT/OT recommending SNF but daughter will take the patient home  -C/w Fall Precautions and Neuro-Checks  Leukocytosis -Worsening -WBC is trending up and  worsening and gone from 8.8 -> 9.2 -> 12.3 -> 12.9 -Check blood cultures x2 -Urinalysis unremarkable -Repeat chest x-ray in a.m; chest x-ray from today as above -If continues to worsen may consider a CT of the chest -Continue monitor  and trend and currently he is afebrile -Repeat CBC in the AM   AKI on CKD Stage 3b Metabolic Acidosis, Improved -Presented with a BUN/Cr of 47/2.03; Has a Baseline Cr of 1.3-1.5 -Getting Gentle IVF Hydration and BUN/Cr is improved. BUN/Cr is now further improved to 24/1.37 -Patient's Metabolic Acidosis improved and now has a CO2 of 23, AG of 7, and Chloride Level  -Avoid Nephrotoxic Medications, Contrast Dyes, Hypotension -Continue to Monitor and Trend and Repeat CMP in the AM   Normocytic Anemia/Anemia of Chronic Kidney Disease -Patient's Hgb/Hct went from 12.2/37.9 -> 11.3/34.8 -> 12.4/38.3 -Likley dilutional Drop -Check Anemia Panel in the AM -Continue to Monitor for S/Sx of Bleeding; No overt bleeding noted -Repeat CBC in the AM   Hypokalemia -Patient's K+ went from 4.2 -> 3.4 -Mag Level was 1.7 so will replete with IV Mag Sulfate -Replete with po K Phos Neutral 500 mg x1 and po KCl 40 mEQ BID x2 -Continue to Monitor and Replete as Necessary -Repeat CMP in the AM   Hyponatremia -Patient's Na+ went from 133 -> 134 -Currently getting IVF with NS at 75 mL/hr -Continue to Monitor and Trend -Repeat CMP in the AM   Hypophosphatemia -Patient's Phos Level was 2.4 -Replete with po K Phos Neutral 500 mg x1 -Continue to Monitor and Replete as Necessary -Repeat Phos Level   Atrial Fibrillation, Likely Chronic -EKG showes A Fib with Rate control -Resumed Eliquis but reduced dose to 2.5 mg po BID but upon further review there was prior discussion about AC vs ASA and daughter had agreed to ASA 81 mg Daily; We have stopped AC and resume ASA 81 mg given his Chronic Anemia  -Does not appear to be on any Rate Controlling Medications  -If Necessary move to Telemetry   Essential HTN -Lasix is being held due to AKI -Continue to Monitor BP per Protocol -Last BP was 111/68  Elevated D-Dimer -VQ showed no evidence of PE -Initiated on [facility] with Apixaban but stopped and changed to po  ASA  Elevated Troponin and likely demand ischemia from SOB -Troponin x2 Flat at 29 -Patient denies CP  GERD -C/w Pantoprazole 40 mg po Daily   Hx of Colon Cancer and Prostate Cancer -Follows with Dr. Delton Coombes for Cecal Cancer and [facility]  BPH -Resume Tamsulosin 0.4 mg po daily and Mirabegron ER 50 mg po Daily   DVT prophylaxis: Anticoagulated with Apixaban Code Status: DO NOT RESUSCITATE  Family Communication: No family present at bedside but spoke to the Daughter over the phone Disposition Plan: Pending further clinical improvement; PT/OT recommending SNF but patient to go home with daughter.   Status is: Observation  The patient will require care spanning > 2 midnights and should be moved to inpatient because: Unsafe d/c plan, IV treatments appropriate due to intensity of illness or inability to take PO and Inpatient level of care appropriate due to severity of illness  Dispo: The patient is from: Home              Anticipated d/c is to: SNF              Patient currently is not medically stable to d/c.   Difficult to place patient No  Consultants:   None  Procedures: None  Antimicrobials:  Anti-infectives (From admission, onward)   Start     Dose/Rate Route Frequency Ordered Stop   04/16/21 1630  cefTRIAXone (ROCEPHIN) 1 g in sodium chloride 0.9 % 100 mL IVPB        1 g 200 mL/hr over 30 Minutes Intravenous Every 24 hours 04/16/21 1540     04/16/21 1630  azithromycin (ZITHROMAX) 500 mg in sodium chloride 0.9 % 250 mL IVPB        500 mg 250 mL/hr over 60 Minutes Intravenous Every 24 hours 04/16/21 1540          Subjective: Seen and examined at bedside and pain but thinks he is doing better and he is not short of breath.  Denies any chest pain, illness or dizziness.  OT thinks he is little bit stronger and was only min assist today.  Patient denies any other concerns or complaints at this time.  Objective: Vitals:   04/16/21 1325 04/16/21  2142 04/17/21 0359 04/17/21 1346  BP: 113/70 127/71 122/79 111/68  Pulse: 81 89 89 82  Resp: 19 18 18 18   Temp:  97.8 F (36.6 C) (!) 97.1 F (36.2 C)   TempSrc:  Oral    SpO2: 97% 97% 99% 100%  Weight:      Height:        Intake/Output Summary (Last 24 hours) at 04/17/2021 1429 Last data filed at 04/17/2021 1340 Gross per 24 hour  Intake 1905.56 ml  Output 451 ml  Net 1454.56 ml   Filed Weights   04/15/21 1406  Weight: 62.6 kg   Examination: Physical Exam:  Constitutional: The patient is a thin chronically ill-appearing elderly Caucasian male, in no acute distress sitting in the chair  Eyes: Lids and conjunctivae normal, sclerae anicteric  ENMT: External Ears, Nose appear normal.  The patient is is very hard of hearing Neck: Appears normal, supple, no cervical masses, normal ROM, no appreciable thyromegaly; no JVD Respiratory: Diminished to auscultation bilaterally with coarse breath sounds, no wheezing, rales, rhonchi or crackles. Normal respiratory effort and patient is not tachypenic. No accessory muscle use.  Not wearing supplemental oxygen via nasal cannula Cardiovascular: RRR, no murmurs / rubs / gallops. S1 and S2 auscultated.  Trace extremity edema Abdomen: Soft, non-tender, non-distended. Bowel sounds positive.  GU: Deferred. Musculoskeletal: No clubbing / cyanosis of digits/nails. No joint deformity upper and lower extremities.  Skin: No rashes, lesions, ulcers on limited skin evaluation. No induration; Warm and dry.  Neurologic: CN 2-12 grossly intact with no focal deficits. Romberg sign and cerebellar reflexes not assessed.  Psychiatric: Normal judgment and insight. Alert and oriented x 3. Normal mood and appropriate affect.   Data Reviewed: I have personally reviewed following labs and imaging studies  CBC: Recent Labs  Lab 04/15/21 1504 04/16/21 0510 04/17/21 0806  WBC 8.8 9.2 12.3*  NEUTROABS 6.5  --  10.6*  HGB 12.2* 11.3* 11.3*  HCT 37.9* 34.8* 33.5*   MCV 94.0 93.5 91.8  PLT 334 330 [id number]   Basic Metabolic Panel: Recent Labs  Lab 04/15/21 1504 04/16/21 0510 04/17/21 0806  NA 135 133* 134*  K 4.9 4.2 3.4*  CL 107 110 104  CO2 19* 15* 23  GLUCOSE 99 85 117*  BUN 47* 38* 24*  CREATININE 2.03* 1.53* 1.37*  CALCIUM 10.1 9.3 8.9  MG  --  1.9 1.7  PHOS  --  3.1 2.4*   GFR: Estimated Creatinine Clearance: 30.5 mL/min (A) (by C-G formula based  on SCr of 1.37 mg/dL (H)). Liver Function Tests: Recent Labs  Lab 04/15/21 1504 04/16/21 0510 04/17/21 0806  AST 21 14* 17  ALT 28 21 19   ALKPHOS 61 52 48  BILITOT 0.7 0.9 0.7  PROT 7.2 6.2* 6.0*  ALBUMIN 3.4* 3.0* 2.9*   No results for input(s): LIPASE, AMYLASE in the last 168 hours. No results for input(s): AMMONIA in the last 168 hours. Coagulation Profile: Recent Labs  Lab 04/16/21 0510  INR 1.6*   Cardiac Enzymes: No results for input(s): CKTOTAL, CKMB, CKMBINDEX, TROPONINI in the last 168 hours. BNP (last 3 results) No results for input(s): PROBNP in the last 8760 hours. HbA1C: No results for input(s): HGBA1C in the last 72 hours. CBG: No results for input(s): GLUCAP in the last 168 hours. Lipid Profile: No results for input(s): CHOL, HDL, LDLCALC, TRIG, CHOLHDL, LDLDIRECT in the last 72 hours. Thyroid Function Tests: No results for input(s): TSH, T4TOTAL, FREET4, T3FREE, THYROIDAB in the last 72 hours. Anemia Panel: No results for input(s): VITAMINB12, FOLATE, FERRITIN, TIBC, IRON, RETICCTPCT in the last 72 hours. Sepsis Labs: Recent Labs  Lab 04/16/21 0510  PROCALCITON 0.27    Recent Results (from the past 240 hour(s))  Resp Panel by RT-PCR (Flu A&B, Covid) Nasopharyngeal Swab     Status: None   Collection Time: 04/15/21  5:03 PM   Specimen: Nasopharyngeal Swab; Nasopharyngeal(NP) swabs in vial transport medium  Result Value Ref Range Status   SARS Coronavirus 2 by RT PCR NEGATIVE NEGATIVE Final    Comment: (NOTE) SARS-CoV-2 target nucleic acids are NOT  DETECTED.  The SARS-CoV-2 RNA is generally detectable in upper respiratory specimens during the acute phase of infection. The lowest concentration of SARS-CoV-2 viral copies this assay can detect is 138 copies/mL. A negative result does not preclude SARS-Cov-2 infection and should not be used as the sole basis for treatment or other patient management decisions. A negative result may occur with  improper specimen collection/handling, submission of specimen other than nasopharyngeal swab, presence of viral mutation(s) within the areas targeted by this assay, and inadequate number of viral copies(<138 copies/mL). A negative result must be combined with clinical observations, patient history, and epidemiological information. The expected result is Negative.  Fact Sheet for Patients:  EntrepreneurPulse.com.au  Fact Sheet for Healthcare Providers:  IncredibleEmployment.be  This test is no t yet approved or cleared by the [place] FDA and  has been authorized for detection and/or diagnosis of SARS-CoV-2 by FDA under an Emergency Use Authorization (EUA). This EUA will remain  in effect (meaning this test can be used) for the duration of the COVID-19 declaration under Section 564(b)(1) of the Act, 21 U.S.C.section 360bbb-3(b)(1), unless the authorization is terminated  or revoked sooner.       Influenza A by PCR NEGATIVE NEGATIVE Final   Influenza B by PCR NEGATIVE NEGATIVE Final    Comment: (NOTE) The Xpert Xpress SARS-CoV-2/FLU/RSV plus assay is intended as an aid in the diagnosis of influenza from Nasopharyngeal swab specimens and should not be used as a sole basis for treatment. Nasal washings and aspirates are unacceptable for Xpert Xpress SARS-CoV-2/FLU/RSV testing.  Fact Sheet for Patients: EntrepreneurPulse.com.au  Fact Sheet for Healthcare Providers: IncredibleEmployment.be  This test is not yet  approved or cleared by the [place] FDA and has been authorized for detection and/or diagnosis of SARS-CoV-2 by FDA under an Emergency Use Authorization (EUA). This EUA will remain in effect (meaning this test can be used) for the duration of  the COVID-19 declaration under Section 564(b)(1) of the Act, 21 U.S.C. section 360bbb-3(b)(1), unless the authorization is terminated or revoked.  Performed at [facility], [address] [place], [place] [address]   MRSA PCR Screening     Status: None   Collection Time: 04/16/21 10:27 AM   Specimen: Nasopharyngeal  Result Value Ref Range Status   MRSA by PCR NEGATIVE NEGATIVE Final    Comment:        The GeneXpert MRSA Assay (FDA approved for NASAL specimens only), is one component of a comprehensive MRSA colonization surveillance program. It is not intended to diagnose MRSA infection nor to guide or monitor treatment for MRSA infections. Performed at [facility], [address], [place],  [address]      RN Pressure Injury Documentation:     Estimated body mass index is 20.38 kg/m as calculated from the following:   Height as of this encounter: 5\' 9"  (1.753 m).   Weight as of this encounter: 62.6 kg.  Malnutrition Type:    Malnutrition Characteristics:   Nutrition Interventions:   Radiology Studies: NM Pulmonary Perfusion  Result Date: 04/15/2021 CLINICAL DATA:  Shortness of breath x 1 month. EXAM: NUCLEAR MEDICINE PERFUSION LUNG SCAN TECHNIQUE: Perfusion images were obtained in multiple projections after intravenous injection of radiopharmaceutical. Ventilation scans intentionally deferred if perfusion scan and chest x-ray adequate for interpretation during COVID 19 epidemic. RADIOPHARMACEUTICALS:  4.4 mCi Tc-32m MAA IV COMPARISON:  None. FINDINGS: Markedly limited study secondary to positioning of the patient's upper extremities as well as the inability of the patient to lay flat on his back. Diffuse, heterogeneously  decreased tracer uptake is noted involving the upper lobes of both lungs, however, this is likely, in part, secondary to the previously noted study limitations. No segmental or subsegmental focal perfusion defects are identified. IMPRESSION: 1. Markedly limited study, as described above, without evidence to strongly suggest the presence of pulmonary embolism. Electronically Signed   By: Virgina Norfolk M.D.   On: 04/15/2021 22:19   DG CHEST PORT 1 VIEW  Result Date: 04/17/2021 CLINICAL DATA:  Shortness of breath EXAM: PORTABLE CHEST 1 VIEW COMPARISON:  Apr 15, 2021 FINDINGS: No edema or airspace opacity. Interstitium overall is thickened. Heart is enlarged, stable, with pulmonary vascular normal. There is aortic atherosclerosis. Bones are osteoporotic. IMPRESSION: Interstitial thickening may indicate a degree of underlying chronic bronchitis. No edema or consolidation. Stable cardiac enlargement. Aortic Atherosclerosis (ICD10-I70.0). Electronically Signed   By: Lowella Grip III M.D.   On: 04/17/2021 09:52   DG Chest Portable 1 View  Result Date: 04/15/2021 CLINICAL DATA:  Weakness and shortness of breath. EXAM: PORTABLE CHEST 1 VIEW COMPARISON:  PA and lateral chest 01/28/2021 and 12/01/2020. CT chest 04/07/2021. FINDINGS: Lung volumes are somewhat low with mild basilar atelectasis, worse on the left. No pneumothorax or pleural fluid. Cardiomegaly. Aortic atherosclerosis. No acute or focal bony abnormality. IMPRESSION: No acute disease. Cardiomegaly. Aortic Atherosclerosis (ICD10-I70.0). Electronically Signed   By: Inge Rise M.D.   On: 04/15/2021 15:52   Scheduled Meds: . aspirin EC  81 mg Oral Daily  . feeding supplement  237 mL Oral BID BM  . guaiFENesin  1,200 mg Oral BID  . pantoprazole  40 mg Oral Daily   Continuous Infusions: . sodium chloride 75 mL/hr at 04/17/21 1116  . azithromycin 500 mg (04/16/21 1718)  . cefTRIAXone (ROCEPHIN)  IV 1 g (04/16/21 1709)    LOS: 0 days    Kerney Elbe, DO [facility] PAGER is on AMION  If 7PM-7AM, please contact night-coverage [email or web address]

## 2021-04-17 NOTE — Plan of Care (Signed)
  Problem: Acute Rehab OT Goals (only OT should resolve) Goal: Pt. Will Perform Grooming Flowsheets (Taken 04/17/2021 1034) Pt Will Perform Grooming:  standing  with min guard assist  with min assist Goal: Pt. Will Perform Upper Body Dressing Flowsheets (Taken 04/17/2021 1034) Pt Will Perform Upper Body Dressing:  with modified independence  sitting Goal: Pt. Will Perform Lower Body Dressing Flowsheets (Taken 04/17/2021 1034) Pt Will Perform Lower Body Dressing:  with min assist  sitting/lateral leans  with adaptive equipment  sit to/from stand Goal: Pt. Will Transfer To Toilet Flowsheets (Taken 04/17/2021 1034) Pt Will Transfer to Toilet:  with min guard assist  stand pivot transfer  grab bars Goal: Pt/Caregiver Will Perform Home Exercise Program Flowsheets (Taken 04/17/2021 1034) Pt/caregiver will Perform Home Exercise Program:  Increased strength  Increased ROM  Both right and left upper extremity  With Supervision  Takyra Cantrall OT, MOT

## 2021-04-17 NOTE — Evaluation (Signed)
Occupational Therapy Evaluation Patient Details Name: Tony Holt MRN: [id number] DOB: 08/12/1926 Today's Date: 04/17/2021    History of Present Illness Tony Holt is a 87 y.o. male with medical history significant for GERD, hypertension, BPH, chronic atrial fibrillation, prostate cancer, cecal adenocarcinoma (diagnosed-February 2021) and CKD stage IIIA who presents to the emergency department due to cough, shortness of breath and body aches.  Patient was unable to provide history, history was obtained from ED physician and daughter at bedside.  Per daughter, patient developed cough, chest congestion about a week ago, but this has since progressed to increased generalized weakness and fatigue.  He went to his primary care physician yesterday and was diagnosed to have pneumonia, intramuscular antibiotic was given, patient returned to the PCP for follow-up, on seeing that patient's patient continued to worsen, he was asked to go to the ED for further evaluation.  There was no reported fever, nausea or vomiting.   Clinical Impression   Pt agreeable to OT evaluation. Pt demonstrated slow labored movement during functional transfers requiring min to Mod A using RW. Min guard assist needed for supine to sit bed mobility. Pt demonstrates mild limitations in A/ROM and P/ROM of shoulder flexion. Pt noted to lack safety awareness during transfers due to difficulty slowly lowering to chair rather then plopping backwards. Pt will benefit from continued OT in the hospital and recommended venue below to increase strength, balance, and endurance for safe ADL's.     Follow Up Recommendations  SNF    Equipment Recommendations  None recommended by OT           Precautions / Restrictions Precautions Precautions: Fall Restrictions Weight Bearing Restrictions: No      Mobility Bed Mobility Overal bed mobility: Needs Assistance Bed Mobility: Supine to Sit     Supine to sit: Min guard      General bed mobility comments: increased time, labored movement    Transfers Overall transfer level: Needs assistance Equipment used: Rolling walker (2 wheeled) Transfers: Sit to/from [facility] Sit to Stand: Min assist;Mod assist Stand pivot transfers: Min assist;Mod assist       General transfer comment: slow labored movement, difficulty slowly lowering into chair    Balance Overall balance assessment: Needs assistance Sitting-balance support: Feet supported;No upper extremity supported Sitting balance-Leahy Scale: Fair (fair to good) Sitting balance - Comments: seated at EOB   Standing balance support: During functional activity;Bilateral upper extremity supported Standing balance-Leahy Scale: Poor Standing balance comment: fair/poor using RW                           ADL either performed or assessed with clinical judgement   ADL Overall ADL's : Needs assistance/impaired                         Toilet Transfer: Minimal assistance;Moderate assistance;Stand-pivot;RW Toilet Transfer Details (indicate cue type and reason): simulated via stand pivot transfer EOB to chair with RW.                 Vision Baseline Vision/History:  (Pt reported poor vision for some time now.)                  Pertinent Vitals/Pain Pain Assessment: No/denies pain          Extremity/Trunk Assessment Upper Extremity Assessment Upper Extremity Assessment: Generalized weakness;RUE deficits/detail;LUE deficits/detail (~150* B shoulder flexion A/ROM. P/ROM WFL shoulder flexion  and abduction.)   Lower Extremity Assessment Lower Extremity Assessment: Defer to PT evaluation   Cervical / Trunk Assessment Cervical / Trunk Assessment: Kyphotic   Communication Communication Communication: HOH   Cognition Arousal/Alertness: Awake/alert Behavior During Therapy: WFL for tasks assessed/performed Overall Cognitive Status: Within Functional Limits for  tasks assessed                                                      Home Living Family/patient expects to be discharged to:: Private residence Living Arrangements: Children Available Help at Discharge: Family;Available 24 hours/day Type of Home: House Home Access: Level entry     Home Layout: One level     Bathroom Shower/Tub: Tub/shower unit;Door   Bathroom Toilet: Handicapped height     Home Equipment: [place] - single point;Shower seat;Bedside commode;Walker - 2 wheels;Walker - 4 wheels;Grab bars - tub/shower;Grab bars - toilet          Prior Functioning/Environment Level of Independence: Needs assistance  Gait / Transfers Assistance Needed: household ambulator using RW, recently having to use Rollator ADL's / Homemaking Assistance Needed: assisted by family            OT Problem List: Decreased strength;Decreased range of motion;Decreased activity tolerance;Impaired balance (sitting and/or standing)      OT Treatment/Interventions: Self-care/ADL training;Therapeutic exercise;Energy conservation;DME and/or AE instruction;Therapeutic activities;Patient/family education;Balance training    OT Goals(Current goals can be found in the care plan section) Acute Rehab OT Goals Patient Stated Goal: return home with family to assist OT Goal Formulation: With patient Time For Goal Achievement: 05/01/21 Potential to Achieve Goals: Fair  OT Frequency: Min 2X/week    AM-PAC OT "6 Clicks" Daily Activity     Outcome Measure Help from another person eating meals?: A Little Help from another person taking care of personal grooming?: A Little Help from another person toileting, which includes using toliet, bedpan, or urinal?: A Lot Help from another person bathing (including washing, rinsing, drying)?: A Lot Help from another person to put on and taking off regular upper body clothing?: A Little Help from another person to put on and taking off regular lower  body clothing?: A Lot 6 Click Score: 15   End of Session Equipment Utilized During Treatment: Rolling walker  Activity Tolerance: Patient tolerated treatment well Patient left: in chair;with call bell/phone within reach;with chair alarm set  OT Visit Diagnosis: Unsteadiness on feet (R26.81);Other abnormalities of gait and mobility (R26.89);Muscle weakness (generalized) (M62.81);Low vision, both eyes (H54.2)                Time: 1751-0258 OT Time Calculation (min): 18 min Charges:  OT General Charges $OT Visit: 1 Visit OT Evaluation $OT Eval Low Complexity: 1 Low  Joyell Emami OT, MOT   Larey Seat 04/17/2021, 10:30 AM

## 2021-04-17 NOTE — Plan of Care (Signed)
  Problem: Education: Goal: Knowledge of General Education information will improve Description: Including pain rating scale, medication(s)/side effects and non-pharmacologic comfort measures Outcome: Progressing   Problem: Clinical Measurements: Goal: Will remain free from infection Outcome: Progressing   

## 2021-04-18 ENCOUNTER — Observation Stay (HOSPITAL_COMMUNITY): Payer: Medicare HMO

## 2021-04-18 DIAGNOSIS — J42 Unspecified chronic bronchitis: Secondary | ICD-10-CM

## 2021-04-18 DIAGNOSIS — C180 Malignant neoplasm of cecum: Secondary | ICD-10-CM | POA: Diagnosis not present

## 2021-04-18 DIAGNOSIS — I482 Chronic atrial fibrillation, unspecified: Secondary | ICD-10-CM | POA: Diagnosis not present

## 2021-04-18 DIAGNOSIS — N179 Acute kidney failure, unspecified: Secondary | ICD-10-CM | POA: Diagnosis not present

## 2021-04-18 DIAGNOSIS — J189 Pneumonia, unspecified organism: Secondary | ICD-10-CM | POA: Diagnosis not present

## 2021-04-18 LAB — COMPREHENSIVE METABOLIC PANEL
ALT: 21 U/L (ref 0–44)
AST: 19 U/L (ref 15–41)
Albumin: 2.9 g/dL — ABNORMAL LOW (ref 3.5–5.0)
Alkaline Phosphatase: 50 U/L (ref 38–126)
Anion gap: 8 (ref 5–15)
BUN: 20 mg/dL (ref 8–23)
CO2: 22 mmol/L (ref 22–32)
Calcium: 8.9 mg/dL (ref 8.9–10.3)
Chloride: 104 mmol/L (ref 98–111)
Creatinine, Ser: 1.26 mg/dL — ABNORMAL HIGH (ref 0.61–1.24)
GFR, Estimated: 54 mL/min — ABNORMAL LOW (ref >60)
Glucose, Bld: 90 mg/dL (ref 70–99)
Potassium: 4 mmol/L (ref 3.5–5.1)
Sodium: 134 mmol/L — ABNORMAL LOW (ref 135–145)
Total Bilirubin: 0.5 mg/dL (ref 0.3–1.2)
Total Protein: 6 g/dL — ABNORMAL LOW (ref 6.5–8.1)

## 2021-04-18 LAB — CBC WITH DIFFERENTIAL/PLATELET
Abs Immature Granulocytes: 0.61 10*3/uL — ABNORMAL HIGH (ref 0.00–0.07)
Basophils Absolute: 0.1 10*3/uL (ref 0.0–0.1)
Basophils Relative: 1 %
Eosinophils Absolute: 0.1 10*3/uL (ref 0.0–0.5)
Eosinophils Relative: 1 %
HCT: 32.3 % — ABNORMAL LOW (ref 39.0–52.0)
Hemoglobin: 10.5 g/dL — ABNORMAL LOW (ref 13.0–17.0)
Immature Granulocytes: 6 %
Lymphocytes Relative: 8 %
Lymphs Abs: 0.9 10*3/uL (ref 0.7–4.0)
MCH: 30.3 pg (ref 26.0–34.0)
MCHC: 32.5 g/dL (ref 30.0–36.0)
MCV: 93.1 fL (ref 80.0–100.0)
Monocytes Absolute: 1 10*3/uL (ref 0.1–1.0)
Monocytes Relative: 9 %
Neutro Abs: 8.1 10*3/uL — ABNORMAL HIGH (ref 1.7–7.7)
Neutrophils Relative %: 75 %
Platelets: 333 10*3/uL (ref 150–400)
RBC: 3.47 MIL/uL — ABNORMAL LOW (ref 4.22–5.81)
RDW: 14.2 % (ref 11.5–15.5)
WBC: 10.8 10*3/uL — ABNORMAL HIGH (ref 4.0–10.5)
nRBC: 0 % (ref 0.0–0.2)

## 2021-04-18 LAB — IRON AND TIBC
Iron: 24 ug/dL — ABNORMAL LOW (ref 45–182)
Saturation Ratios: 13 % — ABNORMAL LOW (ref 17.9–39.5)
TIBC: 179 ug/dL — ABNORMAL LOW (ref 250–450)
UIBC: 155 ug/dL

## 2021-04-18 LAB — RETICULOCYTES
Immature Retic Fract: 18.3 % — ABNORMAL HIGH (ref 2.3–15.9)
RBC.: 3.43 MIL/uL — ABNORMAL LOW (ref 4.22–5.81)
Retic Count, Absolute: 44.9 10*3/uL (ref 19.0–186.0)
Retic Ct Pct: 1.3 % (ref 0.4–3.1)

## 2021-04-18 LAB — MAGNESIUM: Magnesium: 2.2 mg/dL (ref 1.7–2.4)

## 2021-04-18 LAB — FERRITIN: Ferritin: 497 ng/mL — ABNORMAL HIGH (ref 24–336)

## 2021-04-18 LAB — PHOSPHORUS: Phosphorus: 2.5 mg/dL (ref 2.5–4.6)

## 2021-04-18 LAB — FOLATE: Folate: 29.6 ng/mL (ref >5.9)

## 2021-04-18 LAB — PROCALCITONIN: Procalcitonin: <0.1 ng/mL

## 2021-04-18 LAB — VITAMIN B12: Vitamin B-12: 312 pg/mL (ref 180–914)

## 2021-04-18 MED ORDER — FERROUS SULFATE 325 (65 FE) MG PO TABS: 325.0000 mg | ORAL_TABLET | Freq: Every day | ORAL | Status: DC

## 2021-04-18 MED ORDER — CEFDINIR 300 MG PO CAPS: 300.0000 mg | ORAL_CAPSULE | Freq: Two times a day (BID) | ORAL | 0 refills | Status: AC

## 2021-04-18 MED ORDER — FERROUS SULFATE 325 (65 FE) MG PO TABS: 325.0000 mg | ORAL_TABLET | Freq: Every day | ORAL | 0 refills | Status: AC

## 2021-04-18 MED ORDER — AZITHROMYCIN 500 MG PO TABS: 500.0000 mg | ORAL_TABLET | Freq: Every day | ORAL | 0 refills | Status: AC

## 2021-04-18 NOTE — Plan of Care (Signed)
  Problem: Education: Goal: Knowledge of General Education information will improve Description Including pain rating scale, medication(s)/side effects and non-pharmacologic comfort measures Outcome: Progressing   Problem: Health Behavior/Discharge Planning: Goal: Ability to manage health-related needs will improve Outcome: Progressing   

## 2021-04-18 NOTE — Plan of Care (Signed)
  Problem: Education: Goal: Knowledge of General Education information will improve Description: Including pain rating scale, medication(s)/side effects and non-pharmacologic comfort measures 04/18/2021 1231 by Santa Lighter, RN Outcome: Adequate for Discharge 04/18/2021 1231 by Santa Lighter, RN Outcome: Progressing   Problem: Clinical Measurements: Goal: Ability to maintain clinical measurements within normal limits will improve Outcome: Adequate for Discharge Goal: Will remain free from infection Outcome: Adequate for Discharge Goal: Diagnostic test results will improve Outcome: Adequate for Discharge Goal: Respiratory complications will improve Outcome: Adequate for Discharge Goal: Cardiovascular complication will be avoided Outcome: Adequate for Discharge   Problem: Activity: Goal: Risk for activity intolerance will decrease Outcome: Adequate for Discharge   Problem: Nutrition: Goal: Adequate nutrition will be maintained Outcome: Adequate for Discharge   Problem: Coping: Goal: Level of anxiety will decrease Outcome: Adequate for Discharge   Problem: Elimination: Goal: Will not experience complications related to bowel motility Outcome: Adequate for Discharge Goal: Will not experience complications related to urinary retention Outcome: Adequate for Discharge   Problem: Pain Managment: Goal: General experience of comfort will improve Outcome: Adequate for Discharge   Problem: Safety: Goal: Ability to remain free from injury will improve Outcome: Adequate for Discharge   Problem: Skin Integrity: Goal: Risk for impaired skin integrity will decrease Outcome: Adequate for Discharge

## 2021-04-18 NOTE — Progress Notes (Signed)
Nsg Discharge Note  Admit Date:  04/15/2021 Discharge date: 04/18/2021   Alanda Slim to be D/C'd Home per MD order.  AVS completed.  Copy for chart, and copy for patient signed, and dated. Patient/caregiver able to verbalize understanding. Removed IV-CDI. Reviewed d/c paperwork with patient and daughter. Answered all questions. Wheeled stable patient and belongings to main entrance where he was picked up by his daughter to d/c to home. Discharge Medication: Allergies as of 04/18/2021   No Known Allergies     Medication List    STOP taking these medications   doxycycline 100 MG capsule Commonly known as: VIBRAMYCIN   furosemide 20 MG tablet Commonly known as: LASIX   Gemtesa 75 MG Tabs Generic drug: Vibegron   levofloxacin 500 MG tablet Commonly known as: LEVAQUIN   methylPREDNISolone 4 MG Tbpk tablet Commonly known as: MEDROL DOSEPAK     TAKE these medications   albuterol 108 (90 Base) MCG/ACT inhaler Commonly known as: VENTOLIN HFA Inhale 2 puffs into the lungs every 6 (six) hours as needed for wheezing or shortness of breath.   Aspirin 81 MG Caps Take 81 mg by mouth daily.   azithromycin 500 MG tablet Commonly known as: Zithromax Take 1 tablet (500 mg total) by mouth daily for 3 days. Take 1 tablet daily for 3 days.   cefdinir 300 MG capsule Commonly known as: OMNICEF Take 1 capsule (300 mg total) by mouth 2 (two) times daily for 3 days.   clonazePAM 0.5 MG tablet Commonly known as: KLONOPIN Take 0.5 mg by mouth daily.   CULTURELLE PROBIOTICS PO Take 1 tablet by mouth daily.   diphenhydramine-acetaminophen 25-500 MG Tabs tablet Commonly known as: TYLENOL PM Take 4 tablets by mouth at bedtime.   docusate sodium 100 MG capsule Commonly known as: COLACE Take 1 capsule (100 mg total) by mouth 2 (two) times daily as needed for mild constipation.   Ensure Take 237 mLs by mouth in the morning, at noon, and at bedtime.   ferrous sulfate 325 (65 FE) MG  tablet Take 1 tablet (325 mg total) by mouth daily with breakfast. Start taking on: Apr 19, 2021   guaiFENesin 600 MG 12 hr tablet Commonly known as: MUCINEX Take 600 mg by mouth 2 (two) times daily.   HYDROcodone-acetaminophen 7.5-325 MG tablet Commonly known as: NORCO Take 1 tablet by mouth in the morning, at noon, in the evening, and at bedtime.   mirabegron ER 50 MG Tb24 tablet Commonly known as: MYRBETRIQ Take 1 tablet (50 mg total) by mouth daily.   multivitamin with minerals tablet Take 1 tablet by mouth daily.   omeprazole 20 MG capsule Commonly known as: PRILOSEC Take 20 mg by mouth 2 (two) times daily.   ondansetron 4 MG disintegrating tablet Commonly known as: ZOFRAN-ODT Take 4 mg by mouth daily as needed for nausea or vomiting.   tamsulosin 0.4 MG Caps capsule Commonly known as: FLOMAX Take 1 capsule (0.4 mg total) by mouth in the morning and at bedtime.       Discharge Assessment: Vitals:   04/17/21 2156 04/18/21 0519  BP: 133/90 128/73  Pulse: 99 89  Resp: 20 20  Temp: 98.1 F (36.7 C) 98.5 F (36.9 C)  SpO2: 95% 95%   Skin clean, dry and intact without evidence of skin break down, no evidence of skin tears noted. IV catheter discontinued intact. Site without signs and symptoms of complications - no redness or edema noted at insertion site, patient denies c/o  pain - only slight tenderness at site.  Dressing with slight pressure applied.  D/c Instructions-Education: Discharge instructions given to patient/family with verbalized understanding. D/c education completed with patient/family including follow up instructions, medication list, d/c activities limitations if indicated, with other d/c instructions as indicated by MD - patient able to verbalize understanding, all questions fully answered. Patient instructed to return to ED, call 911, or call MD for any changes in condition.  Patient escorted via [place], and D/C home via private auto.  Santa Lighter,  RN 04/18/2021 2:18 PM

## 2021-04-18 NOTE — Discharge Summary (Addendum)
Physician Discharge Summary  Tony Holt [id number] DOB: [birth year] DOA: 04/15/2021  PCP: [name], MD  Admit date: 04/15/2021 Discharge date: 04/18/2021  Admitted From: Home Disposition: Home with Home Health PT/OT as Family Declined SNF placement   Recommendations for Outpatient Follow-up:  1. Follow up with PCP in 1-2 weeks 2. Follow-up with urology as well as oncology in outpatient setting for cancer follow-up 3. Please obtain CMP/CBC, Mag, Phos in one week 4. Repeat CXR in 3-6 weeks 5. Please follow up on the following pending results:  Home Health: YES Equipment/Devices: None    Discharge Condition: Stable  CODE STATUS: FULL CODE Diet recommendation: Heart Healthy Diet   Brief/Interim Summary: The patient is a 87 year old Caucasian male with a past medical history significant for but not limited to hard of hearing, GERD, hypertension, BPH, chronic atrial fibrillation not previously on anticoagulation, history of prostate cancer, cecal adenocarcinoma which was diagnosed in February 2021, and history of CKD stage IIIa as well as other comorbidities who presented to the ED with a chief complaint of a cough, shortness of breath and body aches.  Patient unable to provide a subjective history and history is obtained from the ED physician and daughter.  Patient's daughter stated that patient developed a cough and chest congestion about a week ago and this has progressed to increase generalized weakness and fatigue.  He went to his PCP the day before yesterday and diagnosed with a pneumonia and given intramuscular antibiotic and then he returned to PCP for follow-up and monitoring the patient patient continued to worsen and he was asked to go to the ED for evaluation.  In the ED he is initially tachycardic but otherwise vital signs within normal limits.  He did have a slight AKI and SARS-CoV-2 testing and influenza A and B are negative.  Chest x-ray showed no active disease and a VQ  scan done showed no evidence to strongly suggest the presence of PE.  He was given IV hydration and initiated on antibiotics and admitted for suspected community-acquired pneumonia but further workup shows it could be a chronic Bronchitis.  Patient is improving but WBC elevated and he had some electrolyte abnormalities. Will replete and repeat blood work. Patient evaluated by OT and he is improving from a strength standpoint.  His blood work including his renal function trended down and his WBC trended down as well.  He has been deemed stable to be discharged and will follow up with his PCP and family has declined SNF placement/volume abdominal services.  Discharge Diagnoses:  Active Problems:   Essential hypertension   Generalized weakness   Acute kidney injury superimposed on CKD (HCC)   Cecal cancer (HCC)   Prostate cancer ([facility])   Atrial fibrillation, chronic (HCC)   CAP (community acquired pneumonia)   GERD without esophagitis   Shortness of breath   Elevated d-dimer   Elevated troponin   Elevated brain natriuretic peptide (BNP) level  Shortness of breath and cough secondary to presumed community-acquired pneumonia but now likely from Chronic Bronchitis, present on admission, improving -Patient presented with a dry cough and chest congestion however he had no fever or elevated WBC -Initial chest x-ray showed no acute findings -This could be viral in nature however procalcitonin level was checked and was 0.27 we will start the patient on IV ceftriaxone and azithromycin for possible superimposed bacterial pneumonia; Repeat PCT is pending -Initiated on Mucinex and changed to scheduled, will continue incentive spirometry and flutter valve -Started the patient on  breathing treatments with Xopenex/Atrovent q6h Wheezing and SOB -SpO2: 100 %; Not requiring Supplemental O2  -Check Ambulatory Home O2 Screen and repeat CXR this AM showed "Interstitial thickening may indicate a degree of  underlying chronic bronchitis. No edema or consolidation. Stable cardiac enlargement. Aortic  Atherosclerosis" -WBC is trending up and worsening and gone from 8.8 -> 9.2 -> 12.3 -> 12.9 and is now 10.8 -Consider CT Chest but will hold for now; IF WBC continues to worsen will obtain but will hold off and can be deferred in the outpatient setting if necessary -Check Blood Cx x2 and showed no growth today at less than 24 hours -Could be POST-COVID Syndrome  -Change antibiotics to p.o. cefdinir and azithromycin for 3 more days -Repeat chest x-ray this morning showed "Interval decrease in right lower lung zone airspace opacity which could represent a combination of atelectasis versus infection/inflammation." -PT/OT recommending SNF but daughter  wants to take the patient home -Repeat chest x-ray in 3 to 6 weeks  Generalized Weakness, improving  -In the setting of Above -PT/OT recommending SNF but daughter will take the patient home  -C/w Fall Precautions and Neuro-Checks  Leukocytosis -Worsening -WBC is trending up and worsening and gone from 8.8 -> 9.2 -> 12.3 -> 12.9 is improved to 10.8 today -Check blood cultures x2 and showed no growth to date less than 24 hours -Urinalysis unremarkable -Repeat chest x-ray in a.m; chest x-ray from today as above -If continues to worsen may consider a CT of the chest -Continue monitor and trend and currently he is afebrile -Repeat CBC within 1 week  AKI on CKD Stage 3b Metabolic Acidosis, Improved -Presented with a BUN/Cr of 47/2.03; Has a Baseline Cr of 1.3-1.5 -Getting Gentle IVF Hydration and BUN/Cr is improved. BUN/Cr is now further improved to 24/1.37 yesterday and today is further improved to 22/1.26 -Patient's Metabolic Acidosis improved and now has a CO2 of 23, AG of 7, and Chloride Level  -Avoid Nephrotoxic Medications, Contrast Dyes, Hypotension -Continue to Monitor and Trend and Repeat CMP in the AM   Normocytic Anemia/Anemia of Chronic  Kidney Disease -Patient's Hgb/Hct went from 12.2/37.9 -> 11.3/34.8 -> 12.4/38.3 and is now 10.5/32.3 -Likley dilutional Drop -Checked anemia panel and showed an iron level of 24, U IBC 155, TIBC 179, saturation ratios of 13%, ferritin level of 497, folate level 29.6, and vitamin B12 level 312 We will start the patient on ferrous sulfate 3 2 5  mg daily with breakfast -Continue to Monitor for S/Sx of Bleeding; No overt bleeding noted -Repeat CBC in the AM   Hypokalemia -Patient's K+ went from 4.2 -> 3.4 and today is 4.0 -Continue to Monitor and Replete as Necessary -Repeat CMP in the AM   Hyponatremia -Patient's Na+ went from 133 -> 134 and is again 134 today from the third day in a row -Currently getting IVF with NS at 75 mL/hr until discharge -Continue to Monitor and Trend -Repeat CMP in the AM   Hypophosphatemia -Patient's Phos Level was 2.5 -Replete with po K Phos Neutral 500 mg x1 yesterday -Continue to Monitor and Replete as Necessary -Repeat Phos Level   Atrial Fibrillation, Likely Chronic -EKG showes A Fib with Rate control -Resumed Eliquis but reduced dose to 2.5 mg po BID but upon further review there was prior discussion about AC vs ASA and daughter had agreed to ASA 81 mg Daily; We have stopped AC and resume ASA 81 mg given his Chronic Anemia  -Does not appear to be on any  Rate Controlling Medications  -If Necessary move to Telemetry   Essential HTN -Lasix is being held due to AKI -Continue to Monitor BP per Protocol -Last BP was 128/73  Elevated D-Dimer -VQ showed no evidence of PE -Initiated on [facility] with Apixaban but stopped and changed to po ASA given home regimen  Elevated Troponin and likely demand ischemia from SOB -Troponin x2 Flat at 29 -Patient denies CP  GERD -C/w Pantoprazole 40 mg po Daily   Hx of Colon Cancer and Prostate Cancer -Follows with Dr. Delton Coombes for Cecal Cancer and [facility]  BPH -Resume Tamsulosin 0.4 mg  po daily and Mirabegron ER 50 mg po Daily   Discharge Instructions  Discharge Instructions    Call MD for:  difficulty breathing, headache or visual disturbances   Complete by: As directed    Call MD for:  extreme fatigue   Complete by: As directed    Call MD for:  hives   Complete by: As directed    Call MD for:  persistant dizziness or light-headedness   Complete by: As directed    Call MD for:  persistant nausea and vomiting   Complete by: As directed    Call MD for:  redness, tenderness, or signs of infection (pain, swelling, redness, odor or green/yellow discharge around incision site)   Complete by: As directed    Call MD for:  severe uncontrolled pain   Complete by: As directed    Call MD for:  temperature >100.4   Complete by: As directed    Diet - low sodium heart healthy   Complete by: As directed    DYSPHAGIA 2 DIET   Discharge instructions   Complete by: As directed    You were cared for by a hospitalist during your hospital stay. If you have any questions about your discharge medications or the care you received while you were in the hospital after you are discharged, you can call the unit and ask to speak with the hospitalist on call if the hospitalist that took care of you is not available. Once you are discharged, your primary care physician will handle any further medical issues. Please note that NO REFILLS for any discharge medications will be authorized once you are discharged, as it is imperative that you return to your primary care physician (or establish a relationship with a primary care physician if you do not have one) for your aftercare needs so that they can reassess your need for medications and monitor your lab values.  Follow up with PCP and Pulmonary if necessary. Take all medications as prescribed. If symptoms change or worsen please return to the ED for evaluation   Increase activity slowly   Complete by: As directed      Allergies as of 04/18/2021    No Known Allergies     Medication List    STOP taking these medications   doxycycline 100 MG capsule Commonly known as: VIBRAMYCIN   furosemide 20 MG tablet Commonly known as: LASIX   Gemtesa 75 MG Tabs Generic drug: Vibegron   levofloxacin 500 MG tablet Commonly known as: LEVAQUIN   methylPREDNISolone 4 MG Tbpk tablet Commonly known as: MEDROL DOSEPAK     TAKE these medications   albuterol 108 (90 Base) MCG/ACT inhaler Commonly known as: VENTOLIN HFA Inhale 2 puffs into the lungs every 6 (six) hours as needed for wheezing or shortness of breath.   Aspirin 81 MG Caps Take 81 mg by mouth daily.  azithromycin 500 MG tablet Commonly known as: Zithromax Take 1 tablet (500 mg total) by mouth daily for 3 days. Take 1 tablet daily for 3 days.   cefdinir 300 MG capsule Commonly known as: OMNICEF Take 1 capsule (300 mg total) by mouth 2 (two) times daily for 3 days.   clonazePAM 0.5 MG tablet Commonly known as: KLONOPIN Take 0.5 mg by mouth daily.   CULTURELLE PROBIOTICS PO Take 1 tablet by mouth daily.   diphenhydramine-acetaminophen 25-500 MG Tabs tablet Commonly known as: TYLENOL PM Take 4 tablets by mouth at bedtime.   docusate sodium 100 MG capsule Commonly known as: COLACE Take 1 capsule (100 mg total) by mouth 2 (two) times daily as needed for mild constipation.   Ensure Take 237 mLs by mouth in the morning, at noon, and at bedtime.   ferrous sulfate 325 (65 FE) MG tablet Take 1 tablet (325 mg total) by mouth daily with breakfast. Start taking on: Apr 19, 2021   guaiFENesin 600 MG 12 hr tablet Commonly known as: MUCINEX Take 600 mg by mouth 2 (two) times daily.   HYDROcodone-acetaminophen 7.5-325 MG tablet Commonly known as: NORCO Take 1 tablet by mouth in the morning, at noon, in the evening, and at bedtime.   mirabegron ER 50 MG Tb24 tablet Commonly known as: MYRBETRIQ Take 1 tablet (50 mg total) by mouth daily.   multivitamin with minerals  tablet Take 1 tablet by mouth daily.   omeprazole 20 MG capsule Commonly known as: PRILOSEC Take 20 mg by mouth 2 (two) times daily.   ondansetron 4 MG disintegrating tablet Commonly known as: ZOFRAN-ODT Take 4 mg by mouth daily as needed for nausea or vomiting.   tamsulosin 0.4 MG Caps capsule Commonly known as: FLOMAX Take 1 capsule (0.4 mg total) by mouth in the morning and at bedtime.       No Known Allergies  Consultations:  None  Procedures/Studies: DG Pelvis 1-2 Views  Result Date: 04/07/2021 CLINICAL DATA:  87 year old male with back pain. EXAM: PELVIS - 1-2 VIEW COMPARISON:  None. FINDINGS: There is no acute fracture or dislocation. Evaluation for fracture however is limited due to advanced osteopenia. Probable old healed fractures of the inferior pubic rami. Surgical sutures noted in the left lower quadrant. The soft tissues are unremarkable. IMPRESSION: 1. No acute fracture or dislocation. 2. Osteopenia. Electronically Signed   By: Anner Crete M.D.   On: 04/07/2021 15:59   NM Pulmonary Perfusion  Result Date: 04/15/2021 CLINICAL DATA:  Shortness of breath x 1 month. EXAM: NUCLEAR MEDICINE PERFUSION LUNG SCAN TECHNIQUE: Perfusion images were obtained in multiple projections after intravenous injection of radiopharmaceutical. Ventilation scans intentionally deferred if perfusion scan and chest x-ray adequate for interpretation during COVID 19 epidemic. RADIOPHARMACEUTICALS:  4.4 mCi Tc-34m MAA IV COMPARISON:  None. FINDINGS: Markedly limited study secondary to positioning of the patient's upper extremities as well as the inability of the patient to lay flat on his back. Diffuse, heterogeneously decreased tracer uptake is noted involving the upper lobes of both lungs, however, this is likely, in part, secondary to the previously noted study limitations. No segmental or subsegmental focal perfusion defects are identified. IMPRESSION: 1. Markedly limited study, as described  above, without evidence to strongly suggest the presence of pulmonary embolism. Electronically Signed   By: Virgina Norfolk M.D.   On: 04/15/2021 22:19   CT L-SPINE NO CHARGE  Result Date: 04/07/2021 CLINICAL DATA:  Initial evaluation for acute lower back pain with difficulty walking.  EXAM: CT LUMBAR SPINE WITHOUT CONTRAST TECHNIQUE: Multidetector CT imaging of the lumbar spine was performed without intravenous contrast administration. Multiplanar CT image reconstructions were also generated. COMPARISON:  Prior CT from 01/17/2020 FINDINGS: Segmentation: Standard. Lowest well-formed disc space labeled the L5-S1 level. Alignment: Mild sigmoid scoliosis. Alignment otherwise normal with preservation of the normal lumbar lordosis. No listhesis. Vertebrae: Chronic compression deformity of T12 with up to 50% height loss and 3 mm bony retropulsion, stable. Vertebral body height otherwise maintained without acute or chronic fracture. Visualized sacrum and pelvis intact. No discrete or worrisome osseous lesions. Underlying osteopenia noted. Paraspinal and other soft tissues: Paraspinous soft tissues demonstrate no acute finding. Scattered cysts noted about the partially visualized kidneys. Aortic atherosclerosis noted. Disc levels: T12-L1: Degenerative intervertebral disc space narrowing with diffuse disc bulge. Up to 3 mm bony retropulsion related to the chronic T12 compression fracture. Mild facet hypertrophy. No significant spinal stenosis. Moderate left worse than right foraminal narrowing. L1-2: Minimal annular disc bulge with facet hypertrophy. No stenosis. L2-3: Minimal annular disc bulge with facet hypertrophy. No stenosis. L3-4: Minimal annular disc bulge. Mild to moderate facet hypertrophy. No significant stenosis. L4-5: Mild diffuse disc bulge. Mild to moderate facet hypertrophy. No significant spinal stenosis. Foramina remain patent. L5-S1: Negative interspace. Mild facet hypertrophy. No canal or foraminal  stenosis. IMPRESSION: 1. No acute abnormality within the lumbar spine. 2. Chronic T12 compression deformity with up to 50% height loss and 3 mm bony retropulsion, stable. 3. Degenerative disc bulging with facet hypertrophy at T12-L1 with resultant moderate left worse than right foraminal stenosis. 4. Aortic Atherosclerosis (ICD10-I70.0). Electronically Signed   By: Jeannine Boga M.D.   On: 04/07/2021 19:45   DG CHEST PORT 1 VIEW  Result Date: 04/18/2021 CLINICAL DATA:  Shortness of breath, cough, body aches. History of hypertension, colon cancer, former smoker 1 pack a day X 50 years. EXAM: PORTABLE CHEST 1 VIEW COMPARISON:  Chest x-ray 04/17/2021, CT chest 04/07/2021 FINDINGS: The heart size and mediastinal contours are unchanged. Aortic calcification. Biapical pleural/pulmonary scarring. Interval decrease in right lower lung zone airspace opacity. Similar-appearing creased interstitial markings with no overt pulmonary edema. No pleural effusion. No pneumothorax. No acute osseous abnormality. IMPRESSION: Interval decrease in right lower lung zone airspace opacity which could represent a combination of atelectasis versus infection/inflammation. Electronically Signed   By: Iven Finn M.D.   On: 04/18/2021 05:46   DG CHEST PORT 1 VIEW  Result Date: 04/17/2021 CLINICAL DATA:  Shortness of breath EXAM: PORTABLE CHEST 1 VIEW COMPARISON:  Apr 15, 2021 FINDINGS: No edema or airspace opacity. Interstitium overall is thickened. Heart is enlarged, stable, with pulmonary vascular normal. There is aortic atherosclerosis. Bones are osteoporotic. IMPRESSION: Interstitial thickening may indicate a degree of underlying chronic bronchitis. No edema or consolidation. Stable cardiac enlargement. Aortic Atherosclerosis (ICD10-I70.0). Electronically Signed   By: Lowella Grip III M.D.   On: 04/17/2021 09:52   DG Chest Portable 1 View  Result Date: 04/15/2021 CLINICAL DATA:  Weakness and shortness of breath. EXAM:  PORTABLE CHEST 1 VIEW COMPARISON:  PA and lateral chest 01/28/2021 and 12/01/2020. CT chest 04/07/2021. FINDINGS: Lung volumes are somewhat low with mild basilar atelectasis, worse on the left. No pneumothorax or pleural fluid. Cardiomegaly. Aortic atherosclerosis. No acute or focal bony abnormality. IMPRESSION: No acute disease. Cardiomegaly. Aortic Atherosclerosis (ICD10-I70.0). Electronically Signed   By: Inge Rise M.D.   On: 04/15/2021 15:52   CT Angio Chest/Abd/Pel for Dissection W and/or W/WO  Result Date: 04/07/2021 CLINICAL DATA:  87 year old male with chest and back pain. Concern for aortic dissection. EXAM: CT ANGIOGRAPHY CHEST, ABDOMEN AND PELVIS TECHNIQUE: Non-contrast CT of the chest was initially obtained. Multidetector CT imaging through the chest, abdomen and pelvis was performed using the standard protocol during bolus administration of intravenous contrast. Multiplanar reconstructed images and MIPs were obtained and reviewed to evaluate the vascular anatomy. CONTRAST:  8mL OMNIPAQUE IOHEXOL 350 MG/ML SOLN COMPARISON:  CT abdomen pelvis dated 10/01/2020. FINDINGS: Evaluation of this exam is limited due to respiratory motion artifact. CTA CHEST FINDINGS Cardiovascular: Mild cardiomegaly. No pericardial effusion. There is coronary vascular calcification. Moderate atherosclerotic calcification of the thoracic aorta. No aneurysmal dilatation or dissection. The origins of the great vessels of the aortic arch appear patent as visualized. The central pulmonary arteries are unremarkable for the degree of opacification. Mediastinum/Nodes: No hilar or mediastinal adenopathy. There is a small hiatal hernia. The esophagus and the thyroid gland are grossly unremarkable. No mediastinal fluid collection. Lungs/Pleura: Mild chronic interstitial coarsening. An area of streaky density in the right upper lobe likely atelectasis or scarring. No focal consolidation, pleural effusion, or pneumothorax. The  central airways are patent. Musculoskeletal: Osteopenia with degenerative changes of the spine. No acute osseous pathology. Review of the MIP images confirms the above findings. CTA ABDOMEN AND PELVIS FINDINGS VASCULAR Aorta: Moderate atherosclerotic calcification. No aneurysmal dilatation or dissection. No periaortic fluid collection. Celiac: Atherosclerotic calcification of the origin of the celiac axis. The celiac axis and its major branches remain patent. SMA: Atherosclerotic calcification of the origin of the SMA. The SMA is patent. Renals: There is atherosclerotic calcification of the renal ostia. The renal arteries are patent. IMA: Patent without evidence of aneurysm, dissection, vasculitis or significant stenosis. Inflow: Atherosclerotic calcification of the iliac arteries. No aneurysmal dilatation or dissection. The iliac arteries are patent. Veins: No obvious venous abnormality within the limitations of this arterial phase study. Review of the MIP images confirms the above findings. NON-VASCULAR No intra-abdominal free air or free fluid. Hepatobiliary: The liver is unremarkable. No intrahepatic biliary ductal dilatation. No definite calcified gallstone. Apparent nodular thickening of the distal body and fundus may represent gallbladder folds or areas of adenomyomatosis. Evaluation is limited due to respiratory motion. Pancreas: No active inflammatory changes. Spleen: No acute findings. Adrenals/Urinary Tract: The adrenal glands unremarkable. There are bilateral renal cysts and smaller hypodense lesions which are too small to characterize. There is no hydronephrosis on either side. The visualized ureters and urinary bladder appear unremarkable. Stomach/Bowel: There is postsurgical changes of bowel with anastomotic sutures. There is no bowel obstruction or active inflammation. Lymphatic: No adenopathy. Reproductive: The prostate and seminal vesicles are grossly unremarkable. No pelvic masses Other: Midline  vertical anterior pelvic wall incisional scar. Right inguinal hernia repair plug. Musculoskeletal: Osteopenia with degenerative changes of the spine. Mildly displaced fractures of the right superior and inferior pubic rami similar to the CT of 2021 and without healing. There is advanced osteopenia. There is 6 lumbar type vertebra. There is mild chronic compression fracture of L1 similar to prior CT. No new fracture. Review of the MIP images confirms the above findings. IMPRESSION: 1. No acute intrathoracic, abdominal, or pelvic pathology. No CT evidence of aortic dissection or aneurysm. 2. Mildly displaced fractures of the right superior and inferior pubic rami similar to the CT of 2021 and without healing. Electronically Signed   By: Anner Crete M.D.   On: 04/07/2021 20:08     Subjective: Seen and examined at bedside and doing fairly well and  denied any respiratory complaints.  No nausea or vomiting.  Feels well.  No other concerns or complaints at this time and ready to go home.  Discharge Exam: Vitals:   04/17/21 2156 04/18/21 0519  BP: 133/90 128/73  Pulse: 99 89  Resp: 20 20  Temp: 98.1 F (36.7 C) 98.5 F (36.9 C)  SpO2: 95% 95%   Vitals:   04/17/21 0359 04/17/21 1346 04/17/21 2156 04/18/21 0519  BP: 122/79 111/68 133/90 128/73  Pulse: 89 82 99 89  Resp: 18 18 20 20   Temp: (!) 97.1 F (36.2 C)  98.1 F (36.7 C) 98.5 F (36.9 C)  TempSrc:   Oral Oral  SpO2: 99% 100% 95% 95%  Weight:      Height:       General: Pt is an elderly Caucasian male who is extremely hard of hearing Cardiovascular: RRR, S1/S2 +, no rubs, no gallops Respiratory: Diminished bilaterally with slight wheezing, no rhonchi; unlabored breathing  Abdominal: Soft, NT, ND, bowel sounds + Extremities: no appreciable edema, no cyanosis  The results of significant diagnostics from this hospitalization (including imaging, microbiology, ancillary and laboratory) are listed below for reference.      Microbiology: Recent Results (from the past 240 hour(s))  Resp Panel by RT-PCR (Flu A&B, Covid) Nasopharyngeal Swab     Status: None   Collection Time: 04/15/21  5:03 PM   Specimen: Nasopharyngeal Swab; Nasopharyngeal(NP) swabs in vial transport medium  Result Value Ref Range Status   SARS Coronavirus 2 by RT PCR NEGATIVE NEGATIVE Final    Comment: (NOTE) SARS-CoV-2 target nucleic acids are NOT DETECTED.  The SARS-CoV-2 RNA is generally detectable in upper respiratory specimens during the acute phase of infection. The lowest concentration of SARS-CoV-2 viral copies this assay can detect is 138 copies/mL. A negative result does not preclude SARS-Cov-2 infection and should not be used as the sole basis for treatment or other patient management decisions. A negative result may occur with  improper specimen collection/handling, submission of specimen other than nasopharyngeal swab, presence of viral mutation(s) within the areas targeted by this assay, and inadequate number of viral copies(<138 copies/mL). A negative result must be combined with clinical observations, patient history, and epidemiological information. The expected result is Negative.  Fact Sheet for Patients:  EntrepreneurPulse.com.au  Fact Sheet for Healthcare Providers:  IncredibleEmployment.be  This test is no t yet approved or cleared by the [place] FDA and  has been authorized for detection and/or diagnosis of SARS-CoV-2 by FDA under an Emergency Use Authorization (EUA). This EUA will remain  in effect (meaning this test can be used) for the duration of the COVID-19 declaration under Section 564(b)(1) of the Act, 21 U.S.C.section 360bbb-3(b)(1), unless the authorization is terminated  or revoked sooner.       Influenza A by PCR NEGATIVE NEGATIVE Final   Influenza B by PCR NEGATIVE NEGATIVE Final    Comment: (NOTE) The Xpert Xpress SARS-CoV-2/FLU/RSV plus assay is  intended as an aid in the diagnosis of influenza from Nasopharyngeal swab specimens and should not be used as a sole basis for treatment. Nasal washings and aspirates are unacceptable for Xpert Xpress SARS-CoV-2/FLU/RSV testing.  Fact Sheet for Patients: EntrepreneurPulse.com.au  Fact Sheet for Healthcare Providers: IncredibleEmployment.be  This test is not yet approved or cleared by the [place] FDA and has been authorized for detection and/or diagnosis of SARS-CoV-2 by FDA under an Emergency Use Authorization (EUA). This EUA will remain in effect (meaning this test can be used)  for the duration of the COVID-19 declaration under Section 564(b)(1) of the Act, 21 U.S.C. section 360bbb-3(b)(1), unless the authorization is terminated or revoked.  Performed at [facility], [address] [place], [place] [address]   MRSA PCR Screening     Status: None   Collection Time: 04/16/21 10:27 AM   Specimen: Nasopharyngeal  Result Value Ref Range Status   MRSA by PCR NEGATIVE NEGATIVE Final    Comment:        The GeneXpert MRSA Assay (FDA approved for NASAL specimens only), is one component of a comprehensive MRSA colonization surveillance program. It is not intended to diagnose MRSA infection nor to guide or monitor treatment for MRSA infections. Performed at [facility], [address], [place], [place] [address]   Culture, blood (routine x 2)     Status: None (Preliminary result)   Collection Time: 04/17/21  3:39 PM   Specimen: BLOOD RIGHT ARM  Result Value Ref Range Status   Specimen Description BLOOD RIGHT ARM  Final   Special Requests   Final    Blood Culture results may not be optimal due to an inadequate volume of blood received in culture bottles BOTTLES DRAWN AEROBIC AND ANAEROBIC   Culture   Final    NO GROWTH < 24 HOURS Performed at [facility], [address] [place], [place] [address]    Report Status PENDING  Incomplete   Culture, blood (routine x 2)     Status: None (Preliminary result)   Collection Time: 04/17/21  3:39 PM   Specimen: BLOOD LEFT ARM  Result Value Ref Range Status   Specimen Description BLOOD LEFT ARM  Final   Special Requests   Final    Blood Culture adequate volume BOTTLES DRAWN AEROBIC AND ANAEROBIC   Culture   Final    NO GROWTH < 24 HOURS Performed at [facility], [address] [place], [place] [address]    Report Status PENDING  Incomplete    Labs: BNP (last 3 results) Recent Labs    11/22/20 0236 11/23/20 0355 04/15/21 1649  BNP 755.7* 739.1* [id number]*   Basic Metabolic Panel: Recent Labs  Lab 04/15/21 1504 04/16/21 0510 04/17/21 0806 04/17/21 1420 04/18/21 0553  NA 135 133* 134* 134* 134*  K 4.9 4.2 3.4* 3.7 4.0  CL 107 110 104 101 104  CO2 19* 15* 23 23 22   GLUCOSE 99 85 117* 122* 90  BUN 47* 38* 24* 25* 20  CREATININE 2.03* 1.53* 1.37* 1.43* 1.26*  CALCIUM 10.1 9.3 8.9 9.0 8.9  MG  --  1.9 1.7  --  2.2  PHOS  --  3.1 2.4*  --  2.5   Liver Function Tests: Recent Labs  Lab 04/15/21 1504 04/16/21 0510 04/17/21 0806 04/17/21 1420 04/18/21 0553  AST 21 14* 17 23 19   ALT 28 21 19 24 21   ALKPHOS 61 52 48 57 50  BILITOT 0.7 0.9 0.7 0.6 0.5  PROT 7.2 6.2* 6.0* 6.4* 6.0*  ALBUMIN 3.4* 3.0* 2.9* 3.2* 2.9*   No results for input(s): LIPASE, AMYLASE in the last 168 hours. No results for input(s): AMMONIA in the last 168 hours. CBC: Recent Labs  Lab 04/15/21 1504 04/16/21 0510 04/17/21 0806 04/17/21 1420 04/18/21 0553  WBC 8.8 9.2 12.3* 12.9* 10.8*  NEUTROABS 6.5  --  10.6* 10.8* 8.1*  HGB 12.2* 11.3* 11.3* 12.4* 10.5*  HCT 37.9* 34.8* 33.5* 38.3* 32.3*  MCV 94.0 93.5 91.8 93.6 93.1  PLT 334 330 331 343  333   Cardiac Enzymes: No results for input(s): CKTOTAL, CKMB, CKMBINDEX, TROPONINI in the last 168 hours. BNP: Invalid input(s): POCBNP CBG: No results for input(s): GLUCAP in the last 168 hours. D-Dimer Recent Labs    04/15/21 1832   DDIMER 1.52*   Hgb A1c No results for input(s): HGBA1C in the last 72 hours. Lipid Profile No results for input(s): CHOL, HDL, LDLCALC, TRIG, CHOLHDL, LDLDIRECT in the last 72 hours. Thyroid function studies No results for input(s): TSH, T4TOTAL, T3FREE, THYROIDAB in the last 72 hours.  Invalid input(s): FREET3 Anemia work up Recent Labs    04/18/21 0553  VITAMINB12 312  FOLATE 29.6  FERRITIN 497*  TIBC 179*  IRON 24*  RETICCTPCT 1.3   Urinalysis    Component Value Date/Time   COLORURINE YELLOW 04/15/2021 1801   APPEARANCEUR CLEAR 04/15/2021 1801   APPEARANCEUR Clear 01/16/2021 0919   LABSPEC 1.016 04/15/2021 1801   PHURINE 5.0 04/15/2021 1801   GLUCOSEU NEGATIVE 04/15/2021 1801   HGBUR NEGATIVE 04/15/2021 1801   BILIRUBINUR NEGATIVE 04/15/2021 1801   BILIRUBINUR small (A) 04/02/2021 1707   BILIRUBINUR Negative 01/16/2021 0919   KETONESUR NEGATIVE 04/15/2021 1801   PROTEINUR NEGATIVE 04/15/2021 1801   UROBILINOGEN 4.0 (A) 04/02/2021 1707   UROBILINOGEN 0.2 03/23/2012 2041   NITRITE NEGATIVE 04/15/2021 1801   LEUKOCYTESUR TRACE (A) 04/15/2021 1801   Sepsis Labs Invalid input(s): PROCALCITONIN,  WBC,  LACTICIDVEN Microbiology Recent Results (from the past 240 hour(s))  Resp Panel by RT-PCR (Flu A&B, Covid) Nasopharyngeal Swab     Status: None   Collection Time: 04/15/21  5:03 PM   Specimen: Nasopharyngeal Swab; Nasopharyngeal(NP) swabs in vial transport medium  Result Value Ref Range Status   SARS Coronavirus 2 by RT PCR NEGATIVE NEGATIVE Final    Comment: (NOTE) SARS-CoV-2 target nucleic acids are NOT DETECTED.  The SARS-CoV-2 RNA is generally detectable in upper respiratory specimens during the acute phase of infection. The lowest concentration of SARS-CoV-2 viral copies this assay can detect is 138 copies/mL. A negative result does not preclude SARS-Cov-2 infection and should not be used as the sole basis for treatment or other patient management  decisions. A negative result may occur with  improper specimen collection/handling, submission of specimen other than nasopharyngeal swab, presence of viral mutation(s) within the areas targeted by this assay, and inadequate number of viral copies(<138 copies/mL). A negative result must be combined with clinical observations, patient history, and epidemiological information. The expected result is Negative.  Fact Sheet for Patients:  BloggerCourse.com  Fact Sheet for Healthcare Providers:  SeriousBroker.it  This test is no t yet approved or cleared by the [place] FDA and  has been authorized for detection and/or diagnosis of SARS-CoV-2 by FDA under an Emergency Use Authorization (EUA). This EUA will remain  in effect (meaning this test can be used) for the duration of the COVID-19 declaration under Section 564(b)(1) of the Act, 21 U.S.C.section 360bbb-3(b)(1), unless the authorization is terminated  or revoked sooner.       Influenza A by PCR NEGATIVE NEGATIVE Final   Influenza B by PCR NEGATIVE NEGATIVE Final    Comment: (NOTE) The Xpert Xpress SARS-CoV-2/FLU/RSV plus assay is intended as an aid in the diagnosis of influenza from Nasopharyngeal swab specimens and should not be used as a sole basis for treatment. Nasal washings and aspirates are unacceptable for Xpert Xpress SARS-CoV-2/FLU/RSV testing.  Fact Sheet for Patients: BloggerCourse.com  Fact Sheet for Healthcare Providers: SeriousBroker.it  This test is not yet  approved or cleared by the [place] and has been authorized for detection and/or diagnosis of SARS-CoV-2 by FDA under an Emergency Use Authorization (EUA). This EUA will remain in effect (meaning this test can be used) for the duration of the COVID-19 declaration under Section 564(b)(1) of the Act, 21 U.S.C. section 360bbb-3(b)(1), unless the  authorization is terminated or revoked.  Performed at [facility], [address], [place], [place] [address]   MRSA PCR Screening     Status: None   Collection Time: 04/16/21 10:27 AM   Specimen: Nasopharyngeal  Result Value Ref Range Status   MRSA by PCR NEGATIVE NEGATIVE Final    Comment:        The GeneXpert MRSA Assay (FDA approved for NASAL specimens only), is one component of a comprehensive MRSA colonization surveillance program. It is not intended to diagnose MRSA infection nor to guide or monitor treatment for MRSA infections. Performed at [facility], [address] [place], [place] [address]   Culture, blood (routine x 2)     Status: None (Preliminary result)   Collection Time: 04/17/21  3:39 PM   Specimen: BLOOD RIGHT ARM  Result Value Ref Range Status   Specimen Description BLOOD RIGHT ARM  Final   Special Requests   Final    Blood Culture results may not be optimal due to an inadequate volume of blood received in culture bottles BOTTLES DRAWN AEROBIC AND ANAEROBIC   Culture   Final    NO GROWTH < 24 HOURS Performed at [facility], [address] [place], [place] [address]    Report Status PENDING  Incomplete  Culture, blood (routine x 2)     Status: None (Preliminary result)   Collection Time: 04/17/21  3:39 PM   Specimen: BLOOD LEFT ARM  Result Value Ref Range Status   Specimen Description BLOOD LEFT ARM  Final   Special Requests   Final    Blood Culture adequate volume BOTTLES DRAWN AEROBIC AND ANAEROBIC   Culture   Final    NO GROWTH < 24 HOURS Performed at [facility], [address], [place], [place] [address]    Report Status PENDING  Incomplete   Time coordinating discharge: 35 minutes  SIGNED:  Kerney Elbe, [facility] 04/18/2021, 4:36 PM Pager is on [place]  If 7PM-7AM, please contact night-coverage [email or web address]

## 2021-04-22 LAB — CULTURE, BLOOD (ROUTINE X 2)
Culture: NO GROWTH
Culture: NO GROWTH
Special Requests: ADEQUATE

## 2021-04-23 ENCOUNTER — Ambulatory Visit (HOSPITAL_COMMUNITY)
Admission: RE | Admit: 2021-04-23 | Discharge: 2021-04-23 | Disposition: A | Discharge status: Home / Self Care | Payer: Medicare HMO | Source: Ambulatory Visit | Attending: Hematology | Admitting: Hematology

## 2021-04-23 ENCOUNTER — Inpatient Hospital Stay (HOSPITAL_COMMUNITY): Payer: Medicare HMO | Attending: Hematology

## 2021-04-23 ENCOUNTER — Other Ambulatory Visit: Payer: Self-pay

## 2021-04-23 DIAGNOSIS — R0602 Shortness of breath: Secondary | ICD-10-CM | POA: Diagnosis not present

## 2021-04-23 DIAGNOSIS — M549 Dorsalgia, unspecified: Secondary | ICD-10-CM | POA: Insufficient documentation

## 2021-04-23 DIAGNOSIS — I129 Hypertensive chronic kidney disease with stage 1 through stage 4 chronic kidney disease, or unspecified chronic kidney disease: Secondary | ICD-10-CM | POA: Diagnosis not present

## 2021-04-23 DIAGNOSIS — R55 Syncope and collapse: Secondary | ICD-10-CM | POA: Insufficient documentation

## 2021-04-23 DIAGNOSIS — M47819 Spondylosis without myelopathy or radiculopathy, site unspecified: Secondary | ICD-10-CM | POA: Insufficient documentation

## 2021-04-23 DIAGNOSIS — R102 Pelvic and perineal pain: Secondary | ICD-10-CM | POA: Diagnosis not present

## 2021-04-23 DIAGNOSIS — N1831 Chronic kidney disease, stage 3a: Secondary | ICD-10-CM | POA: Diagnosis not present

## 2021-04-23 DIAGNOSIS — D649 Anemia, unspecified: Secondary | ICD-10-CM | POA: Insufficient documentation

## 2021-04-23 DIAGNOSIS — Z9049 Acquired absence of other specified parts of digestive tract: Secondary | ICD-10-CM | POA: Insufficient documentation

## 2021-04-23 DIAGNOSIS — Z833 Family history of diabetes mellitus: Secondary | ICD-10-CM | POA: Diagnosis not present

## 2021-04-23 DIAGNOSIS — C61 Malignant neoplasm of prostate: Secondary | ICD-10-CM | POA: Insufficient documentation

## 2021-04-23 DIAGNOSIS — F32A Depression, unspecified: Secondary | ICD-10-CM | POA: Insufficient documentation

## 2021-04-23 DIAGNOSIS — Z8249 Family history of ischemic heart disease and other diseases of the circulatory system: Secondary | ICD-10-CM | POA: Insufficient documentation

## 2021-04-23 DIAGNOSIS — R519 Headache, unspecified: Secondary | ICD-10-CM | POA: Insufficient documentation

## 2021-04-23 DIAGNOSIS — R531 Weakness: Secondary | ICD-10-CM | POA: Diagnosis not present

## 2021-04-23 DIAGNOSIS — D509 Iron deficiency anemia, unspecified: Secondary | ICD-10-CM

## 2021-04-23 DIAGNOSIS — R5383 Other fatigue: Secondary | ICD-10-CM | POA: Insufficient documentation

## 2021-04-23 DIAGNOSIS — I700 Atherosclerosis of aorta: Secondary | ICD-10-CM | POA: Diagnosis not present

## 2021-04-23 DIAGNOSIS — C180 Malignant neoplasm of cecum: Secondary | ICD-10-CM | POA: Insufficient documentation

## 2021-04-23 DIAGNOSIS — Z87891 Personal history of nicotine dependence: Secondary | ICD-10-CM | POA: Diagnosis not present

## 2021-04-23 DIAGNOSIS — Z79899 Other long term (current) drug therapy: Secondary | ICD-10-CM | POA: Diagnosis not present

## 2021-04-23 DIAGNOSIS — N281 Cyst of kidney, acquired: Secondary | ICD-10-CM | POA: Insufficient documentation

## 2021-04-23 DIAGNOSIS — R109 Unspecified abdominal pain: Secondary | ICD-10-CM | POA: Diagnosis not present

## 2021-04-23 DIAGNOSIS — K449 Diaphragmatic hernia without obstruction or gangrene: Secondary | ICD-10-CM | POA: Insufficient documentation

## 2021-04-23 LAB — COMPREHENSIVE METABOLIC PANEL
ALT: 23 U/L (ref 0–44)
AST: 19 U/L (ref 15–41)
Albumin: 3.4 g/dL — ABNORMAL LOW (ref 3.5–5.0)
Alkaline Phosphatase: 55 U/L (ref 38–126)
Anion gap: 8 (ref 5–15)
BUN: 33 mg/dL — ABNORMAL HIGH (ref 8–23)
CO2: 24 mmol/L (ref 22–32)
Calcium: 9.2 mg/dL (ref 8.9–10.3)
Chloride: 103 mmol/L (ref 98–111)
Creatinine, Ser: 1.95 mg/dL — ABNORMAL HIGH (ref 0.61–1.24)
GFR, Estimated: 32 mL/min — ABNORMAL LOW (ref >60)
Glucose, Bld: 90 mg/dL (ref 70–99)
Potassium: 4.5 mmol/L (ref 3.5–5.1)
Sodium: 135 mmol/L (ref 135–145)
Total Bilirubin: 0.4 mg/dL (ref 0.3–1.2)
Total Protein: 6.8 g/dL (ref 6.5–8.1)

## 2021-04-23 LAB — CBC WITH DIFFERENTIAL/PLATELET
Abs Immature Granulocytes: 0.2 10*3/uL — ABNORMAL HIGH (ref 0.00–0.07)
Basophils Absolute: 0.1 10*3/uL (ref 0.0–0.1)
Basophils Relative: 1 %
Eosinophils Absolute: 0.1 10*3/uL (ref 0.0–0.5)
Eosinophils Relative: 1 %
HCT: 34.7 % — ABNORMAL LOW (ref 39.0–52.0)
Hemoglobin: 11.2 g/dL — ABNORMAL LOW (ref 13.0–17.0)
Immature Granulocytes: 1 %
Lymphocytes Relative: 6 %
Lymphs Abs: 0.9 10*3/uL (ref 0.7–4.0)
MCH: 30.9 pg (ref 26.0–34.0)
MCHC: 32.3 g/dL (ref 30.0–36.0)
MCV: 95.9 fL (ref 80.0–100.0)
Monocytes Absolute: 1 10*3/uL (ref 0.1–1.0)
Monocytes Relative: 7 %
Neutro Abs: 12.4 10*3/uL — ABNORMAL HIGH (ref 1.7–7.7)
Neutrophils Relative %: 84 %
Platelets: 277 10*3/uL (ref 150–400)
RBC: 3.62 MIL/uL — ABNORMAL LOW (ref 4.22–5.81)
RDW: 14.5 % (ref 11.5–15.5)
WBC: 14.7 10*3/uL — ABNORMAL HIGH (ref 4.0–10.5)
nRBC: 0 % (ref 0.0–0.2)

## 2021-04-23 LAB — IRON AND TIBC
Iron: 38 ug/dL — ABNORMAL LOW (ref 45–182)
Saturation Ratios: 17 % — ABNORMAL LOW (ref 17.9–39.5)
TIBC: 228 ug/dL — ABNORMAL LOW (ref 250–450)
UIBC: 190 ug/dL

## 2021-04-23 LAB — FERRITIN: Ferritin: 487 ng/mL — ABNORMAL HIGH (ref 24–336)

## 2021-04-23 MED ORDER — IOHEXOL 300 MG/ML  SOLN
80.0000 mL | Freq: Once | INTRAMUSCULAR | Status: AC | PRN
  Administered 2021-04-23: 80 mL via INTRAVENOUS

## 2021-04-24 ENCOUNTER — Encounter (HOSPITAL_COMMUNITY): Payer: Self-pay | Admitting: *Deleted

## 2021-04-24 ENCOUNTER — Other Ambulatory Visit (HOSPITAL_COMMUNITY): Payer: Self-pay | Admitting: *Deleted

## 2021-04-24 LAB — CEA: CEA: 2.8 ng/mL (ref 0.0–4.7)

## 2021-04-24 MED ORDER — AMOXICILLIN-POT CLAVULANATE 875-125 MG PO TABS: 1.0000 | ORAL_TABLET | Freq: Two times a day (BID) | ORAL | 0 refills | Status: AC

## 2021-04-29 NOTE — Progress Notes (Signed)
Tony Holt, [place] [address]   CLINIC:  Medical Oncology/Hematology  PCP:  Tony Andrea, MD [address] / [place] [address] [phone]   REASON FOR VISIT:  Follow-up for cecal cancer, prostate cancer, and IDA  PRIOR THERAPY: 1. Colon resection in 1999. 2. Right hemicolectomy on 02/04/2020.  NGS Results: not done  CURRENT THERAPY: Observation; intermittent Feraheme last on 03/12/2020  BRIEF ONCOLOGIC HISTORY:  Oncology History  Cecal cancer ([place])  02/04/2020 Initial Diagnosis   Cecal cancer ([place])   03/03/2020 Cancer Staging   Staging form: Colon and Rectum, AJCC 8th Edition - Clinical stage from 03/03/2020: Stage IIB (cT4a, cN0, cM0) - Signed by Derek Jack, MD on 03/03/2020     CANCER STAGING: Cancer Staging Cecal cancer [facility]) Staging form: Colon and Rectum, AJCC 8th Edition - Clinical stage from 03/03/2020: Stage IIB (cT4a, cN0, cM0) - Signed by Derek Jack, MD on 03/03/2020   INTERVAL HISTORY:  [name], a 87 y.o. male, returns for routine follow-up of his cecal cancer, prostate cancer, and IDA. Tony Holt was last seen on 01/22/2021.   Today he reports feeling poorly. He is currently living at home. He is currently taking Augmentin for pneumonia. He reports thrush and soreness of the mouth. He has started PT at home. His abdominal pain is occasional.   REVIEW OF SYSTEMS:  Review of Systems  Constitutional: Positive for appetite change (25%) and fatigue (depleted).  HENT:   Positive for trouble swallowing (solids).   Gastrointestinal: Positive for abdominal pain (8/10) and nausea.  Neurological: Positive for headaches.  Psychiatric/Behavioral: Positive for depression. The patient is nervous/anxious.   All other systems reviewed and are negative.   PAST MEDICAL/SURGICAL HISTORY:  Past Medical History:  Diagnosis Date  . BPH (benign prostatic hyperplasia)   . Colon cancer ([place]) 1999   . Dysrhythmia    new onset A Fib  . Essential hypertension   . GERD (gastroesophageal reflux disease)   . Hard of hearing   . Prostate cancer [facility])    Past Surgical History:  Procedure Laterality Date  . BIOPSY  12/28/2019   Procedure: BIOPSY;  Surgeon: Daneil Dolin, MD;  Location: [facility] END[facility];  Service: Endoscopy;;  gastric, esophagus,cecal  . CATARACT EXTRACTION W/PHACO  01/18/2013   Procedure: CATARACT EXTRACTION PHACO AND INTRAOCULAR LENS PLACEMENT ([place]);  Surgeon: Tonny Branch, MD;  Location: [facility] ORS;  Service: Ophthalmology;  Laterality: Left;  CDE 17.11  . CATARACT EXTRACTION W/PHACO Right 02/05/2013   Procedure: CATARACT EXTRACTION PHACO AND INTRAOCULAR LENS PLACEMENT ([facility]);  Surgeon: Tonny Branch, MD;  Location: [facility] ORS;  Service: Ophthalmology;  Laterality: Right;  CDE:19.49  . COLON SURGERY  1999   Sigmoid colon cancer, segmental resection by Drs. Smith/Bradford  . COLONOSCOPY  2002   Multiple adenomatous colon polyps  . COLONOSCOPY N/A 12/28/2019   Procedure: COLONOSCOPY;  Surgeon: Daneil Dolin, MD;  Location: [facility];  Service: Endoscopy;  Laterality: N/A;  . ESOPHAGOGASTRODUODENOSCOPY  03/25/2012   Dr. Gala Romney: Severe ulcerative/erosive reflux esophagitis.  Small hiatal hernia.  Abnormal gastric and duodenal mucosa, biopsy showed reactive changes and minimal chronic inflammation but no H. pylori.  . ESOPHAGOGASTRODUODENOSCOPY N/A 12/28/2019   Procedure: ESOPHAGOGASTRODUODENOSCOPY (EGD);  Surgeon: Daneil Dolin, MD;  Location: [facility];  Service: Endoscopy;  Laterality: N/A;  . HERNIA REPAIR  02/2011   Right inguinal hernia repair with mesh, Dr. Geroge Baseman  . HERNIA REPAIR  three total  . PARTIAL COLECTOMY Right 02/04/2020   Procedure: RIGHT HEMI-COLECTOMY;  Surgeon: Virl Cagey, MD;  Location: [facility] ORS;  Service: General;  Laterality: Right;  . POLYPECTOMY  12/28/2019   Procedure: POLYPECTOMY;  Surgeon: Daneil Dolin, MD;  Location: [facility];  Service:  Endoscopy;;  rectal     SOCIAL HISTORY:  Social History   Socioeconomic History  . Marital status: Widowed    Spouse name: Not on file  . Number of children: 5  . Years of education: Not on file  . Highest education level: Not on file  Occupational History  . Occupation: Retired Neurosurgeon: RETIRED  Tobacco Use  . Smoking status: Former Smoker    Packs/day: 1.00    Years: 50.00    Pack years: 50.00    Types: Cigarettes    Quit date: 12/13/1989    Years since quitting: 31.3  . Smokeless tobacco: Current User    Types: Snuff  Vaping Use  . Vaping Use: Never used  Substance and Sexual Activity  . Alcohol use: No    Alcohol/week: 0.0 standard drinks    Comment: previous heavy drinker at age 36-32  . Drug use: No  . Sexual activity: Not Currently  Other Topics Concern  . Not on file  Social History Narrative  . Not on file   Social Determinants of Health   Financial Resource Strain: Not on file  Food Insecurity: Not on file  Transportation Needs: Not on file  Physical Activity: Not on file  Stress: Not on file  Social Connections: Not on file  Intimate Partner Violence: Not on file    FAMILY HISTORY:  Family History  Problem Relation Age of Onset  . Heart attack Father        Deceased, age 47s  . Diabetes Brother   . Colon cancer Neg Hx   . Liver disease Neg Hx   . Breast cancer Neg Hx   . Prostate cancer Neg Hx   . Pancreatic cancer Neg Hx     CURRENT MEDICATIONS:  Current Outpatient Medications  Medication Sig Dispense Refill  . albuterol (VENTOLIN HFA) 108 (90 Base) MCG/ACT inhaler Inhale 2 puffs into the lungs every 6 (six) hours as needed for wheezing or shortness of breath. 6.7 g 0  . amoxicillin-clavulanate (AUGMENTIN) 875-125 MG tablet Take 1 tablet by mouth 2 (two) times daily for 7 days. 14 tablet 0  . Aspirin 81 MG CAPS Take 81 mg by mouth daily.    . clonazePAM (KLONOPIN) 0.5 MG tablet Take 0.5 mg by mouth daily.    .  diphenhydramine-acetaminophen (TYLENOL PM) 25-500 MG TABS tablet Take 4 tablets by mouth at bedtime.    . docusate sodium (COLACE) 100 MG capsule Take 1 capsule (100 mg total) by mouth 2 (two) times daily as needed for mild constipation. 60 capsule 1  . Ensure (ENSURE) Take 237 mLs by mouth in the morning, at noon, and at bedtime.     . ferrous sulfate 325 (65 FE) MG tablet Take 1 tablet (325 mg total) by mouth daily with breakfast. 30 tablet 0  . guaiFENesin (MUCINEX) 600 MG 12 hr tablet Take 600 mg by mouth 2 (two) times daily.    Marland Kitchen HYDROcodone-acetaminophen (NORCO) 7.5-325 MG tablet Take 1 tablet by mouth in the morning, at noon, in the evening, and at bedtime.     . mirabegron ER (MYRBETRIQ) 50 MG TB24 tablet Take 1 tablet (50  mg total) by mouth daily. 30 tablet 11  . Multiple Vitamins-Minerals (MULTIVITAMIN WITH MINERALS) tablet Take 1 tablet by mouth daily.    Marland Kitchen omeprazole (PRILOSEC) 20 MG capsule Take 20 mg by mouth 2 (two) times daily.  11  . ondansetron (ZOFRAN-ODT) 4 MG disintegrating tablet Take 4 mg by mouth daily as needed for nausea or vomiting.     . Probiotic Product (CULTURELLE PROBIOTICS PO) Take 1 tablet by mouth daily.     . tamsulosin (FLOMAX) 0.4 MG CAPS capsule Take 1 capsule (0.4 mg total) by mouth in the morning and at bedtime. 60 capsule 11   No current facility-administered medications for this visit.    ALLERGIES:  No Known Allergies  PHYSICAL EXAM:  Performance status (ECOG): 2 - Symptomatic, <50% confined to bed  There were no vitals filed for this visit. Wt Readings from Last 3 Encounters:  04/15/21 138 lb (62.6 kg)  01/22/21 126 lb 4 oz (57.3 kg)  01/16/21 128 lb (58.1 kg)   Physical Exam Vitals reviewed.  Constitutional:      Appearance: Normal appearance.  HENT:     Mouth/Throat:     Comments: Thrush in back of mouth Cardiovascular:     Rate and Rhythm: Normal rate and regular rhythm.     Pulses: Normal pulses.     Heart sounds: Normal heart  sounds.  Pulmonary:     Effort: Pulmonary effort is normal.     Breath sounds: Normal breath sounds.  Neurological:     General: No focal deficit present.     Mental Status: He is alert and oriented to person, place, and time.  Psychiatric:        Mood and Affect: Mood normal.        Behavior: Behavior normal.      LABORATORY DATA:  I have reviewed the labs as listed.  CBC Latest Ref Rng & Units 04/23/2021 04/18/2021 04/17/2021  WBC 4.0 - 10.5 K/uL 14.7(H) 10.8(H) 12.9(H)  Hemoglobin 13.0 - 17.0 g/dL 11.2(L) 10.5(L) 12.4(L)  Hematocrit 39.0 - 52.0 % 34.7(L) 32.3(L) 38.3(L)  Platelets 150 - 400 K/uL 277 333 343   CMP Latest Ref Rng & Units 04/23/2021 04/18/2021 04/17/2021  Glucose 70 - 99 mg/dL 90 90 122(H)  BUN 8 - 23 mg/dL 33(H) 20 25(H)  Creatinine 0.61 - 1.24 mg/dL 1.95(H) 1.26(H) 1.43(H)  Sodium 135 - 145 mmol/L 135 134(L) 134(L)  Potassium 3.5 - 5.1 mmol/L 4.5 4.0 3.7  Chloride 98 - 111 mmol/L 103 104 101  CO2 22 - 32 mmol/L 24 22 23   Calcium 8.9 - 10.3 mg/dL 9.2 8.9 9.0  Total Protein 6.5 - 8.1 g/dL 6.8 6.0(L) 6.4(L)  Total Bilirubin 0.3 - 1.2 mg/dL 0.4 0.5 0.6  Alkaline Phos 38 - 126 U/L 55 50 57  AST 15 - 41 U/L 19 19 23   ALT 0 - 44 U/L 23 21 24     DIAGNOSTIC IMAGING:  I have independently reviewed the scans and discussed with the patient. DG Pelvis 1-2 Views  Result Date: 04/07/2021 CLINICAL DATA:  87 year old male with back pain. EXAM: PELVIS - 1-2 VIEW COMPARISON:  None. FINDINGS: There is no acute fracture or dislocation. Evaluation for fracture however is limited due to advanced osteopenia. Probable old healed fractures of the inferior pubic rami. Surgical sutures noted in the left lower quadrant. The soft tissues are unremarkable. IMPRESSION: 1. No acute fracture or dislocation. 2. Osteopenia. Electronically Signed   By: Anner Crete M.D.   On:  04/07/2021 15:59   NM Pulmonary Perfusion  Result Date: 04/15/2021 CLINICAL DATA:  Shortness of breath x 1 month. EXAM:  NUCLEAR MEDICINE PERFUSION LUNG SCAN TECHNIQUE: Perfusion images were obtained in multiple projections after intravenous injection of radiopharmaceutical. Ventilation scans intentionally deferred if perfusion scan and chest x-ray adequate for interpretation during COVID 19 epidemic. RADIOPHARMACEUTICALS:  4.4 mCi Tc-62mMAA IV COMPARISON:  None. FINDINGS: Markedly limited study secondary to positioning of the patient's upper extremities as well as the inability of the patient to lay flat on his back. Diffuse, heterogeneously decreased tracer uptake is noted involving the upper lobes of both lungs, however, this is likely, in part, secondary to the previously noted study limitations. No segmental or subsegmental focal perfusion defects are identified. IMPRESSION: 1. Markedly limited study, as described above, without evidence to strongly suggest the presence of pulmonary embolism. Electronically Signed   By: TVirgina NorfolkM.D.   On: 04/15/2021 22:19   CT CHEST ABDOMEN PELVIS W CONTRAST  Result Date: 04/24/2021 CLINICAL DATA:  Follow-up colorectal carcinoma hemicolectomy over 2021. Additional history of prostate cancer. EXAM: CT CHEST, ABDOMEN, AND PELVIS WITH CONTRAST TECHNIQUE: Multidetector CT imaging of the chest, abdomen and pelvis was performed following the standard protocol during bolus administration of intravenous contrast. CONTRAST:  839mOMNIPAQUE IOHEXOL 300 MG/ML  SOLN COMPARISON:  CT abdomen 10/09/2020 FINDINGS: CT CHEST FINDINGS Cardiovascular: Abdominal aorta is normal caliber with atherosclerotic calcification. There is no retroperitoneal or periportal lymphadenopathy. No pelvic lymphadenopathy. Mediastinum/Nodes: No enlarged mediastinal lymph nodes. Small hilar lymph nodes RIGHT pathologic enlarged. Lungs/Pleura: There is new branching nodularity within the posterior aspect of the RIGHT upper lobe compared to CT 04/03/2021. Small RIGHT effusion appears chronic. Is musculoskeletal: No  aggressive osseous lesion. CT ABDOMEN AND PELVIS FINDINGS Hepatobiliary: There is mild intrahepatic biliary duct dilatation similar to comparison CT. Gallbladder is collapsed. Common bile duct is dilated to 10 mm unchanged from 10/09/2020. No dilatation the pancreatic duct. Pancreas: Pancreas is normal. No ductal dilatation. No pancreatic inflammation. Spleen: Normal spleen Adrenals/urinary tract: Adrenal glands normal. Simple fluid attenuation cysts in the kidneys. Ureters and bladder normal. Stomach/Bowel: Small hiatal hernia. Stomach and duodenum normal. Small bowel normal. Status post partial RIGHT hemicolectomy. LEFT colon is normal. Rectum normal. Vascular/Lymphatic: Abdominal aorta is normal caliber with atherosclerotic calcification. There is no retroperitoneal or periportal lymphadenopathy. No pelvic lymphadenopathy. Reproductive: Prostate unremarkable Other: No peritoneal metastasis. Probable RIGHT inguinal hernia repair. No recurrence. Musculoskeletal: Remote fractures of the inferior pubic ramus on the RIGHT. No aggressive osseous lesion identified. IMPRESSION: Chest Impression: 1. Branching nodularity in the posterior aspect of the RIGHT upper lobe is most suggestive aspiration pneumonitis or pulmonary infection. 2. No mediastinal lymphadenopathy. Abdomen / Pelvis Impression: 1. No evidence colorectal carcinoma recurrence or metastasis in the abdomen pelvis. Post RIGHT hemicolectomy anatomy. 2. Chronic biliary ductal dilatation appears benign. 3. No skeletal metastasis. Electronically Signed   By: StSuzy Bouchard.D.   On: 04/24/2021 12:01   CT L-SPINE NO CHARGE  Result Date: 04/07/2021 CLINICAL DATA:  Initial evaluation for acute lower back pain with difficulty walking. EXAM: CT LUMBAR SPINE WITHOUT CONTRAST TECHNIQUE: Multidetector CT imaging of the lumbar spine was performed without intravenous contrast administration. Multiplanar CT image reconstructions were also generated. COMPARISON:  Prior  CT from 01/17/2020 FINDINGS: Segmentation: Standard. Lowest well-formed disc space labeled the L5-S1 level. Alignment: Mild sigmoid scoliosis. Alignment otherwise normal with preservation of the normal lumbar lordosis. No listhesis. Vertebrae: Chronic compression deformity of T12 with up to 50%  height loss and 3 mm bony retropulsion, stable. Vertebral body height otherwise maintained without acute or chronic fracture. Visualized sacrum and pelvis intact. No discrete or worrisome osseous lesions. Underlying osteopenia noted. Paraspinal and other soft tissues: Paraspinous soft tissues demonstrate no acute finding. Scattered cysts noted about the partially visualized kidneys. Aortic atherosclerosis noted. Disc levels: T12-L1: Degenerative intervertebral disc space narrowing with diffuse disc bulge. Up to 3 mm bony retropulsion related to the chronic T12 compression fracture. Mild facet hypertrophy. No significant spinal stenosis. Moderate left worse than right foraminal narrowing. L1-2: Minimal annular disc bulge with facet hypertrophy. No stenosis. L2-3: Minimal annular disc bulge with facet hypertrophy. No stenosis. L3-4: Minimal annular disc bulge. Mild to moderate facet hypertrophy. No significant stenosis. L4-5: Mild diffuse disc bulge. Mild to moderate facet hypertrophy. No significant spinal stenosis. Foramina remain patent. L5-S1: Negative interspace. Mild facet hypertrophy. No canal or foraminal stenosis. IMPRESSION: 1. No acute abnormality within the lumbar spine. 2. Chronic T12 compression deformity with up to 50% height loss and 3 mm bony retropulsion, stable. 3. Degenerative disc bulging with facet hypertrophy at T12-L1 with resultant moderate left worse than right foraminal stenosis. 4. Aortic Atherosclerosis (ICD10-I70.0). Electronically Signed   By: Jeannine Boga M.D.   On: 04/07/2021 19:45   DG CHEST PORT 1 VIEW  Result Date: 04/18/2021 CLINICAL DATA:  Shortness of breath, cough, body aches.  History of hypertension, colon cancer, former smoker 1 pack a day X 50 years. EXAM: PORTABLE CHEST 1 VIEW COMPARISON:  Chest x-ray 04/17/2021, CT chest 04/07/2021 FINDINGS: The heart size and mediastinal contours are unchanged. Aortic calcification. Biapical pleural/pulmonary scarring. Interval decrease in right lower lung zone airspace opacity. Similar-appearing creased interstitial markings with no overt pulmonary edema. No pleural effusion. No pneumothorax. No acute osseous abnormality. IMPRESSION: Interval decrease in right lower lung zone airspace opacity which could represent a combination of atelectasis versus infection/inflammation. Electronically Signed   By: Iven Finn M.D.   On: 04/18/2021 05:46   DG CHEST PORT 1 VIEW  Result Date: 04/17/2021 CLINICAL DATA:  Shortness of breath EXAM: PORTABLE CHEST 1 VIEW COMPARISON:  Apr 15, 2021 FINDINGS: No edema or airspace opacity. Interstitium overall is thickened. Heart is enlarged, stable, with pulmonary vascular normal. There is aortic atherosclerosis. Bones are osteoporotic. IMPRESSION: Interstitial thickening may indicate a degree of underlying chronic bronchitis. No edema or consolidation. Stable cardiac enlargement. Aortic Atherosclerosis (ICD10-I70.0). Electronically Signed   By: Lowella Grip III M.D.   On: 04/17/2021 09:52   DG Chest Portable 1 View  Result Date: 04/15/2021 CLINICAL DATA:  Weakness and shortness of breath. EXAM: PORTABLE CHEST 1 VIEW COMPARISON:  PA and lateral chest 01/28/2021 and 12/01/2020. CT chest 04/07/2021. FINDINGS: Lung volumes are somewhat low with mild basilar atelectasis, worse on the left. No pneumothorax or pleural fluid. Cardiomegaly. Aortic atherosclerosis. No acute or focal bony abnormality. IMPRESSION: No acute disease. Cardiomegaly. Aortic Atherosclerosis (ICD10-I70.0). Electronically Signed   By: Inge Rise M.D.   On: 04/15/2021 15:52   CT Angio Chest/Abd/Pel for Dissection W and/or W/WO  Result  Date: 04/07/2021 CLINICAL DATA:  87 year old male with chest and back pain. Concern for aortic dissection. EXAM: CT ANGIOGRAPHY CHEST, ABDOMEN AND PELVIS TECHNIQUE: Non-contrast CT of the chest was initially obtained. Multidetector CT imaging through the chest, abdomen and pelvis was performed using the standard protocol during bolus administration of intravenous contrast. Multiplanar reconstructed images and MIPs were obtained and reviewed to evaluate the vascular anatomy. CONTRAST:  55m OMNIPAQUE IOHEXOL 350 MG/ML  SOLN COMPARISON:  CT abdomen pelvis dated 10/01/2020. FINDINGS: Evaluation of this exam is limited due to respiratory motion artifact. CTA CHEST FINDINGS Cardiovascular: Mild cardiomegaly. No pericardial effusion. There is coronary vascular calcification. Moderate atherosclerotic calcification of the thoracic aorta. No aneurysmal dilatation or dissection. The origins of the great vessels of the aortic arch appear patent as visualized. The central pulmonary arteries are unremarkable for the degree of opacification. Mediastinum/Nodes: No hilar or mediastinal adenopathy. There is a small hiatal hernia. The esophagus and the thyroid gland are grossly unremarkable. No mediastinal fluid collection. Lungs/Pleura: Mild chronic interstitial coarsening. An area of streaky density in the right upper lobe likely atelectasis or scarring. No focal consolidation, pleural effusion, or pneumothorax. The central airways are patent. Musculoskeletal: Osteopenia with degenerative changes of the spine. No acute osseous pathology. Review of the MIP images confirms the above findings. CTA ABDOMEN AND PELVIS FINDINGS VASCULAR Aorta: Moderate atherosclerotic calcification. No aneurysmal dilatation or dissection. No periaortic fluid collection. Celiac: Atherosclerotic calcification of the origin of the celiac axis. The celiac axis and its major branches remain patent. SMA: Atherosclerotic calcification of the origin of the SMA.  The SMA is patent. Renals: There is atherosclerotic calcification of the renal ostia. The renal arteries are patent. IMA: Patent without evidence of aneurysm, dissection, vasculitis or significant stenosis. Inflow: Atherosclerotic calcification of the iliac arteries. No aneurysmal dilatation or dissection. The iliac arteries are patent. Veins: No obvious venous abnormality within the limitations of this arterial phase study. Review of the MIP images confirms the above findings. NON-VASCULAR No intra-abdominal free air or free fluid. Hepatobiliary: The liver is unremarkable. No intrahepatic biliary ductal dilatation. No definite calcified gallstone. Apparent nodular thickening of the distal body and fundus may represent gallbladder folds or areas of adenomyomatosis. Evaluation is limited due to respiratory motion. Pancreas: No active inflammatory changes. Spleen: No acute findings. Adrenals/Urinary Tract: The adrenal glands unremarkable. There are bilateral renal cysts and smaller hypodense lesions which are too small to characterize. There is no hydronephrosis on either side. The visualized ureters and urinary bladder appear unremarkable. Stomach/Bowel: There is postsurgical changes of bowel with anastomotic sutures. There is no bowel obstruction or active inflammation. Lymphatic: No adenopathy. Reproductive: The prostate and seminal vesicles are grossly unremarkable. No pelvic masses Other: Midline vertical anterior pelvic wall incisional scar. Right inguinal hernia repair plug. Musculoskeletal: Osteopenia with degenerative changes of the spine. Mildly displaced fractures of the right superior and inferior pubic rami similar to the CT of 2021 and without healing. There is advanced osteopenia. There is 6 lumbar type vertebra. There is mild chronic compression fracture of L1 similar to prior CT. No new fracture. Review of the MIP images confirms the above findings. IMPRESSION: 1. No acute intrathoracic, abdominal, or  pelvic pathology. No CT evidence of aortic dissection or aneurysm. 2. Mildly displaced fractures of the right superior and inferior pubic rami similar to the CT of 2021 and without healing. Electronically Signed   By: Anner Crete M.D.   On: 04/07/2021 20:08     ASSESSMENT:  1. Stage IIb (PT4APN0) cecal cancer: -Right hemicolectomy on 02/04/2020. PT4APN0, MMR preserved. 0/19 lymph nodes involved. Margins negative. Grade 2. -He also had colon cancer in 1999 and underwent surgery without any need for chemotherapy. -We reviewed his labs. CEA is within normal limits. -CT CAP on 01/17/2020 showed irregular wall thickening involving the medial wall of the cecum extending into the terminal ileum. No evidence of metastatic disease. Asymmetric mass involving the right lobe of the  prostate gland with extension into the right seminal vesicle and bladder base suspicious for prostate cancer. Stable biliary dilation and bilateral renal cysts. Age-indeterminate T12 compression deformity. -CT shows postoperative changes of right colectomy and ileal reanastomosis. No evidence of recurrence or metastatic disease.  2. Iron deficiency state: -He has normocytic anemia, combination from CKD and relative iron deficiency. -Feraheme on 03/12/2020.  3.T4a adenocarcinoma the prostate: -Gleason score of 5+4, PSA of 133. -Currently receiving ADT.   PLAN:  1. Stage IIb (PT4APN0) cecal cancer: -Does not have any clinical signs or symptoms of recurrence at this time. - CEA was 2.8. - Reviewed CT CAP from 04/23/2021 with no evidence of recurrence. - RTC 4 months with CEA level. - CT scan suggested branching nodularity in the posterior aspect of the right upper lobe suggestive of aspiration pneumonitis or pulmonary infection.  As he was not feeling well, we have started him on Augmentin twice daily. - He has some thrush today for which she will be given Diflucan.  2. T4a adenocarcinoma of the  prostate: -Continue follow-up with Dr. Alyson Ingles.  3.Macrocyticanemia: -Hemoglobin is 11.2 with normal MCV. - Ferritin was 487 with percent saturation of 17. - No intervention needed.  4. CKD: -His creatinine gone up to 1.95 on 04/23/2021, mostly from recent hospitalization and antibiotics.  5. Pelvic pain: -He had multiple minimally displaced fractures of the pelvis and has pelvic pain.   Orders placed this encounter:  No orders of the defined types were placed in this encounter.    Derek Jack, MD [place] [phone]   I, Thana Ates, am acting as a scribe for Dr. Derek Jack.  I, Derek Jack MD, have reviewed the above documentation for accuracy and completeness, and I agree with the above.

## 2021-04-30 ENCOUNTER — Inpatient Hospital Stay (HOSPITAL_COMMUNITY): Payer: Medicare HMO | Admitting: Hematology

## 2021-04-30 ENCOUNTER — Other Ambulatory Visit: Payer: Self-pay

## 2021-04-30 VITALS — BP 131/82 | HR 95 | Temp 97.3°F | Resp 16 | Wt 115.8 lb

## 2021-04-30 DIAGNOSIS — C180 Malignant neoplasm of cecum: Secondary | ICD-10-CM

## 2021-04-30 DIAGNOSIS — D509 Iron deficiency anemia, unspecified: Secondary | ICD-10-CM | POA: Diagnosis not present

## 2021-04-30 DIAGNOSIS — C61 Malignant neoplasm of prostate: Secondary | ICD-10-CM

## 2021-04-30 MED ORDER — FLUCONAZOLE 100 MG PO TABS: 100.0000 mg | ORAL_TABLET | Freq: Every day | ORAL | 0 refills | Status: AC

## 2021-04-30 NOTE — Patient Instructions (Signed)
[facility] °Discharge Instructions ° °You were seen today by Dr. Katragadda. He went over your recent results. Dr. Katragadda will see you back in 4 months for labs and follow up. ° ° °Thank you for choosing [facility] to provide your oncology and hematology care.  To afford each patient quality time with our provider, please arrive at least 15 minutes before your scheduled appointment time.  ° °If you have a lab appointment with the Cancer Center please come in thru the Main Entrance and check in at the main information desk ° °You need to re-schedule your appointment should you arrive 10 or more minutes late.  We strive to give you quality time with our providers, and arriving late affects you and other patients whose appointments are after yours.  Also, if you no show three or more times for appointments you may be dismissed from the clinic at the providers discretion.     °Again, thank you for choosing [facility].  Our hope is that these requests will decrease the amount of time that you wait before being seen by our physicians.       °_____________________________________________________________ ° °Should you have questions after your visit to [facility], please contact our office at [phone] between the hours of 8:00 a.m. and 4:30 p.m.  Voicemails left after 4:00 p.m. will not be returned until the following business day.  For prescription refill requests, have your pharmacy contact our office and allow 72 hours.   ° °Cancer Center Support Programs:  ° °> Cancer Support Group  °2nd Tuesday of the month 1pm-2pm, Journey Room  ° ° °

## 2021-05-15 ENCOUNTER — Other Ambulatory Visit (HOSPITAL_COMMUNITY)
Admission: RE | Admit: 2021-05-15 | Discharge: 2021-05-15 | Disposition: A | Discharge status: Home / Self Care | Payer: Medicare HMO | Source: Ambulatory Visit | Attending: Family Medicine | Admitting: Family Medicine

## 2021-05-15 DIAGNOSIS — J189 Pneumonia, unspecified organism: Secondary | ICD-10-CM | POA: Insufficient documentation

## 2021-05-15 DIAGNOSIS — R634 Abnormal weight loss: Secondary | ICD-10-CM | POA: Diagnosis present

## 2021-05-15 DIAGNOSIS — H9113 Presbycusis, bilateral: Secondary | ICD-10-CM | POA: Insufficient documentation

## 2021-05-15 LAB — COMPREHENSIVE METABOLIC PANEL
ALT: 65 U/L — ABNORMAL HIGH (ref 0–44)
AST: 32 U/L (ref 15–41)
Albumin: 3.9 g/dL (ref 3.5–5.0)
Alkaline Phosphatase: 73 U/L (ref 38–126)
Anion gap: 9 (ref 5–15)
BUN: 58 mg/dL — ABNORMAL HIGH (ref 8–23)
CO2: 18 mmol/L — ABNORMAL LOW (ref 22–32)
Calcium: 9.6 mg/dL (ref 8.9–10.3)
Chloride: 103 mmol/L (ref 98–111)
Creatinine, Ser: 1.79 mg/dL — ABNORMAL HIGH (ref 0.61–1.24)
GFR, Estimated: 35 mL/min — ABNORMAL LOW (ref >60)
Glucose, Bld: 106 mg/dL — ABNORMAL HIGH (ref 70–99)
Potassium: 5.7 mmol/L — ABNORMAL HIGH (ref 3.5–5.1)
Sodium: 130 mmol/L — ABNORMAL LOW (ref 135–145)
Total Bilirubin: 0.6 mg/dL (ref 0.3–1.2)
Total Protein: 7.1 g/dL (ref 6.5–8.1)

## 2021-05-15 LAB — CBC
HCT: 40.4 % (ref 39.0–52.0)
Hemoglobin: 13.1 g/dL (ref 13.0–17.0)
MCH: 30.7 pg (ref 26.0–34.0)
MCHC: 32.4 g/dL (ref 30.0–36.0)
MCV: 94.6 fL (ref 80.0–100.0)
Platelets: 218 10*3/uL (ref 150–400)
RBC: 4.27 MIL/uL (ref 4.22–5.81)
RDW: 15 % (ref 11.5–15.5)
WBC: 18 10*3/uL — ABNORMAL HIGH (ref 4.0–10.5)
nRBC: 0 % (ref 0.0–0.2)

## 2021-05-15 LAB — SEDIMENTATION RATE: Sed Rate: 5 mm/hr (ref 0–16)

## 2021-05-24 ENCOUNTER — Emergency Department (HOSPITAL_COMMUNITY): Payer: Medicare HMO

## 2021-05-24 ENCOUNTER — Emergency Department (HOSPITAL_COMMUNITY)
Admission: EM | Admit: 2021-05-24 | Discharge: 2021-05-24 | Disposition: A | Discharge status: Home / Self Care | Payer: Medicare HMO | Attending: Emergency Medicine | Admitting: Emergency Medicine

## 2021-05-24 ENCOUNTER — Encounter (HOSPITAL_COMMUNITY): Payer: Self-pay

## 2021-05-24 ENCOUNTER — Other Ambulatory Visit: Payer: Self-pay

## 2021-05-24 DIAGNOSIS — Z8616 Personal history of COVID-19: Secondary | ICD-10-CM | POA: Diagnosis not present

## 2021-05-24 DIAGNOSIS — Z8546 Personal history of malignant neoplasm of prostate: Secondary | ICD-10-CM | POA: Insufficient documentation

## 2021-05-24 DIAGNOSIS — K5900 Constipation, unspecified: Secondary | ICD-10-CM | POA: Diagnosis not present

## 2021-05-24 DIAGNOSIS — K6289 Other specified diseases of anus and rectum: Secondary | ICD-10-CM

## 2021-05-24 DIAGNOSIS — Z79899 Other long term (current) drug therapy: Secondary | ICD-10-CM | POA: Insufficient documentation

## 2021-05-24 DIAGNOSIS — Z7982 Long term (current) use of aspirin: Secondary | ICD-10-CM | POA: Insufficient documentation

## 2021-05-24 DIAGNOSIS — Z85038 Personal history of other malignant neoplasm of large intestine: Secondary | ICD-10-CM | POA: Diagnosis not present

## 2021-05-24 DIAGNOSIS — N1831 Chronic kidney disease, stage 3a: Secondary | ICD-10-CM | POA: Insufficient documentation

## 2021-05-24 DIAGNOSIS — I129 Hypertensive chronic kidney disease with stage 1 through stage 4 chronic kidney disease, or unspecified chronic kidney disease: Secondary | ICD-10-CM | POA: Insufficient documentation

## 2021-05-24 DIAGNOSIS — Z87891 Personal history of nicotine dependence: Secondary | ICD-10-CM | POA: Insufficient documentation

## 2021-05-24 DIAGNOSIS — K219 Gastro-esophageal reflux disease without esophagitis: Secondary | ICD-10-CM | POA: Insufficient documentation

## 2021-05-24 DIAGNOSIS — R1032 Left lower quadrant pain: Secondary | ICD-10-CM | POA: Diagnosis present

## 2021-05-24 LAB — URINALYSIS, ROUTINE W REFLEX MICROSCOPIC
Bacteria, UA: NONE SEEN
Bilirubin Urine: NEGATIVE
Glucose, UA: 50 mg/dL — AB
Ketones, ur: 5 mg/dL — AB
Leukocytes,Ua: NEGATIVE
Nitrite: NEGATIVE
Protein, ur: NEGATIVE mg/dL
Specific Gravity, Urine: 1.029 (ref 1.005–1.030)
pH: 5 (ref 5.0–8.0)

## 2021-05-24 LAB — COMPREHENSIVE METABOLIC PANEL
ALT: 33 U/L (ref 0–44)
AST: 22 U/L (ref 15–41)
Albumin: 3.4 g/dL — ABNORMAL LOW (ref 3.5–5.0)
Alkaline Phosphatase: 71 U/L (ref 38–126)
Anion gap: 9 (ref 5–15)
BUN: 45 mg/dL — ABNORMAL HIGH (ref 8–23)
CO2: 19 mmol/L — ABNORMAL LOW (ref 22–32)
Calcium: 8.8 mg/dL — ABNORMAL LOW (ref 8.9–10.3)
Chloride: 103 mmol/L (ref 98–111)
Creatinine, Ser: 1.73 mg/dL — ABNORMAL HIGH (ref 0.61–1.24)
GFR, Estimated: 37 mL/min — ABNORMAL LOW (ref >60)
Glucose, Bld: 90 mg/dL (ref 70–99)
Potassium: 4.8 mmol/L (ref 3.5–5.1)
Sodium: 131 mmol/L — ABNORMAL LOW (ref 135–145)
Total Bilirubin: 1.3 mg/dL — ABNORMAL HIGH (ref 0.3–1.2)
Total Protein: 6.2 g/dL — ABNORMAL LOW (ref 6.5–8.1)

## 2021-05-24 LAB — CBC
HCT: 40.4 % (ref 39.0–52.0)
Hemoglobin: 13.3 g/dL (ref 13.0–17.0)
MCH: 31.4 pg (ref 26.0–34.0)
MCHC: 32.9 g/dL (ref 30.0–36.0)
MCV: 95.5 fL (ref 80.0–100.0)
Platelets: 129 10*3/uL — ABNORMAL LOW (ref 150–400)
RBC: 4.23 MIL/uL (ref 4.22–5.81)
RDW: 15.9 % — ABNORMAL HIGH (ref 11.5–15.5)
WBC: 16.2 10*3/uL — ABNORMAL HIGH (ref 4.0–10.5)
nRBC: 0 % (ref 0.0–0.2)

## 2021-05-24 MED ORDER — SODIUM CHLORIDE 0.9 % IV SOLN: INTRAVENOUS | Status: DC

## 2021-05-24 MED ORDER — FENTANYL CITRATE (PF) 100 MCG/2ML IJ SOLN
50.0000 ug | INTRAMUSCULAR | Status: DC | PRN
  Administered 2021-05-24: 50 ug via INTRAVENOUS
  Filled 2021-05-24: qty 2

## 2021-05-24 MED ORDER — CIPROFLOXACIN HCL 500 MG PO TABS: 500.0000 mg | ORAL_TABLET | Freq: Two times a day (BID) | ORAL | 0 refills | Status: AC

## 2021-05-24 MED ORDER — CIPROFLOXACIN HCL 250 MG PO TABS
500.0000 mg | ORAL_TABLET | Freq: Once | ORAL | Status: AC
  Administered 2021-05-24: 500 mg via ORAL
  Filled 2021-05-24: qty 2

## 2021-05-24 MED ORDER — MAGNESIUM CITRATE PO SOLN
1.0000 | Freq: Once | ORAL | Status: AC
  Administered 2021-05-24: 1 via ORAL
  Filled 2021-05-24: qty 296

## 2021-05-24 MED ORDER — IOHEXOL 300 MG/ML  SOLN
80.0000 mL | Freq: Once | INTRAMUSCULAR | Status: AC | PRN
  Administered 2021-05-24: 80 mL via INTRAVENOUS

## 2021-05-24 NOTE — Discharge Instructions (Addendum)
The CT scan shows that you have constipation all throughout your colon, you will need to take the following medications  Magnesium citrate: We have given this medication to you here MiraLAX, please take 1 scoop 3 times a day until you are having regular bowel movements, then switch to 1 scoop per day to keep your bowel movements soft, if this causes diarrhea you may stop  The CT scan also showed that you had inflammation of your rectum, this is called proctitis and may benefit from an antibiotic.  If you are still having pain or constipation or you are developing worsening symptoms return to the emergency department, otherwise see your doctor within 3 or 4 days for a recheck

## 2021-05-24 NOTE — ED Notes (Signed)
Son, Sonia Side, notified of discharge. He is in route to pick pt up.

## 2021-05-24 NOTE — ED Notes (Signed)
Put pt on male wick

## 2021-05-24 NOTE — ED Triage Notes (Signed)
Pt presents to ED via [facility] from home for constipation and lower abdominal pain x 3 days.

## 2021-05-24 NOTE — ED Provider Notes (Signed)
[facility] EMERGENCY DEPARTMENT Provider Note   CSN: [id number] Arrival date & time: 05/24/21  1458     History Chief Complaint  Patient presents with   Constipation    Tony Holt is a 87 y.o. male.   Constipation  This patient is a 87 year old male, history of prior colon cancer, history of hypertension, history of prostate cancer, history of atrial fibrillation.  He presents to the hospital today with a complaint of lower abdominal discomfort especially on the left side.  Evidently this is been going on for a couple of days, he comes from the [facility] where he currently resides, states that he had no breakfast this morning, no lunch, he is hungry and thirsty but is having waxing and waning pain in the left lower quadrant.  He states he is having to strain really hard to have bowel movements but nothing is coming out, he denies any dysuria, fevers chills nausea or vomiting.  The pain does not radiate, there is no back pain.  Past Medical History:  Diagnosis Date   BPH (benign prostatic hyperplasia)    Colon cancer ([place]) 1999   Dysrhythmia    new onset A Fib   Essential hypertension    GERD (gastroesophageal reflux disease)    Hard of hearing    Prostate cancer [facility])     Patient Active Problem List   Diagnosis Date Noted   Elevated d-dimer 04/16/2021   Elevated troponin 04/16/2021   Elevated brain natriuretic peptide (BNP) level 04/16/2021   Shortness of breath 04/15/2021   COVID-19 virus infection 11/19/2020   GERD without esophagitis 33/00/7622   Acute metabolic encephalopathy 63/33/5456   Protein-calorie malnutrition ([place]) 11/19/2020   BPH without obstruction/lower urinary tract symptoms 10/15/2020   Nocturia 10/15/2020   Pelvis fracture, right, closed, initial encounter ([place]) 10/01/2020   Lobar pneumonia ([place]) 09/03/2020   Acute respiratory failure with hypoxia ([place]) 09/03/2020   Acute renal failure superimposed on stage 3a chronic kidney disease ([place])  09/03/2020   Respiratory failure with hypoxia ([place]) 09/02/2020   CAP (community acquired pneumonia)    Elevated PSA 04/28/2020   AKI (acute kidney injury) ([place]) 02/23/2020   Prostate cancer ([place]) 02/22/2020   Atrial fibrillation, chronic ([place]) 02/22/2020   Cecal cancer ([place])    Iron deficiency anemia due to chronic blood loss 05/17/2019   Constipation 05/17/2019   Bradycardia 05/28/2016   Generalized weakness 05/28/2016   Acute kidney injury superimposed on CKD ([place]) 05/28/2016   Hyperkalemia 05/28/2016   Nausea alone 03/24/2012   Abdominal pain, epigastric 03/24/2012   Essential hypertension 03/24/2012   ANEMIA 09/16/2010   HEMOCCULT POSITIVE STOOL 09/16/2010    Past Surgical History:  Procedure Laterality Date   BIOPSY  12/28/2019   Procedure: BIOPSY;  Surgeon: Daneil Dolin, MD;  Location: [facility] ENDO SUITE;  Service: Endoscopy;;  gastric, esophagus,cecal   CATARACT EXTRACTION W/PHACO  01/18/2013   Procedure: CATARACT EXTRACTION PHACO AND INTRAOCULAR LENS PLACEMENT ([place]);  Surgeon: Tonny Branch, MD;  Location: [facility] ORS;  Service: Ophthalmology;  Laterality: Left;  CDE 17.11   CATARACT EXTRACTION W/PHACO Right 02/05/2013   Procedure: CATARACT EXTRACTION PHACO AND INTRAOCULAR LENS PLACEMENT (IOC);  Surgeon: Tonny Branch, MD;  Location: [facility] ORS;  Service: Ophthalmology;  Laterality: Right;  CDE:19.49   COLON SURGERY  1999   Sigmoid colon cancer, segmental resection by Drs. Smith/Bradford   COLONOSCOPY  2002   Multiple adenomatous colon polyps   COLONOSCOPY N/A 12/28/2019   Procedure: COLONOSCOPY;  Surgeon: Gala Romney,  Cristopher Estimable, MD;  Location: [facility] END[facility] SU[facility];  Service: Endoscopy;  Laterality: N/A;   ESOPHAGOGASTRODUODENOSCOPY  03/25/2012   Dr. Gala Romney: Severe ulcerative/erosive reflux esophagitis.  Small hiatal hernia.  Abnormal gastric and duodenal mucosa, biopsy showed reactive changes and minimal chronic inflammation but no H. pylori.   ESOPHAGOGASTRODUODENOSCOPY N/A 12/28/2019   Procedure:  ESOPHAGOGASTRODUODENOSCOPY (EGD);  Surgeon: Daneil Dolin, MD;  Location: [facility] END[facility];  Service: Endoscopy;  Laterality: N/A;   HERNIA REPAIR  02/2011   Right inguinal hernia repair with mesh, Dr. Geroge Baseman   HERNIA REPAIR     three total   PARTIAL COLECTOMY Right 02/04/2020   Procedure: RIGHT HEMI-COLECTOMY;  Surgeon: Virl Cagey, MD;  Location: [facility] ORS;  Service: General;  Laterality: Right;   POLYPECTOMY  12/28/2019   Procedure: POLYPECTOMY;  Surgeon: Daneil Dolin, MD;  Location: [facility];  Service: Endoscopy;;  rectal        Family History  Problem Relation Age of Onset   Heart attack Father        Deceased, age 51s   Diabetes Brother    Colon cancer Neg Hx    Liver disease Neg Hx    Breast cancer Neg Hx    Prostate cancer Neg Hx    Pancreatic cancer Neg Hx     Social History   Tobacco Use   Smoking status: Former    Packs/day: 1.00    Years: 50.00    Pack years: 50.00    Types: Cigarettes    Quit date: 12/13/1989    Years since quitting: 31.4   Smokeless tobacco: Current    Types: Snuff  Vaping Use   Vaping Use: Never used  Substance Use Topics   Alcohol use: No    Alcohol/week: 0.0 standard drinks    Comment: previous heavy drinker at age 47-30   Drug use: No    Home Medications Prior to Admission medications   Medication Sig Start Date End Date Taking? Authorizing Provider  ciprofloxacin (CIPRO) 500 MG tablet Take 1 tablet (500 mg total) by mouth 2 (two) times daily for 7 days. 05/24/21 05/31/21 Yes Noemi Chapel, MD  albuterol (VENTOLIN HFA) 108 (90 Base) MCG/ACT inhaler Inhale 2 puffs into the lungs every 6 (six) hours as needed for wheezing or shortness of breath. 11/23/20   Thurnell Lose, MD  Aspirin 81 MG CAPS Take 81 mg by mouth daily. 09/04/20   Orson Eva, MD  clonazePAM (KLONOPIN) 0.5 MG tablet Take 0.5 mg by mouth daily. 11/28/20   [provider]  diphenhydramine-acetaminophen (TYLENOL PM) 25-500 MG TABS tablet Take 4  tablets by mouth at bedtime.    [provider]  docusate sodium (COLACE) 100 MG capsule Take 1 capsule (100 mg total) by mouth 2 (two) times daily as needed for mild constipation. 02/07/20   Virl Cagey, MD  Ensure (ENSURE) Take 237 mLs by mouth in the morning, at noon, and at bedtime.     [provider]  ferrous sulfate 325 (65 FE) MG tablet Take 1 tablet (325 mg total) by mouth daily with breakfast. 04/19/21   Raiford Noble Latif, DO  fluconazole (DIFLUCAN) 100 MG tablet Take 1 tablet (100 mg total) by mouth daily. 2 tablets day one 04/30/21   Derek Jack, MD  guaiFENesin (MUCINEX) 600 MG 12 hr tablet Take 600 mg by mouth 2 (two) times daily.    [provider]  HYDROcodone-acetaminophen (NORCO) 7.5-325 MG tablet Take 1 tablet by mouth  in the morning, at noon, in the evening, and at bedtime.  10/01/20   [provider]  mirabegron ER (MYRBETRIQ) 50 MG TB24 tablet Take 1 tablet (50 mg total) by mouth daily. 01/16/21   McKenzie, Candee Furbish, MD  Multiple Vitamins-Minerals (MULTIVITAMIN WITH MINERALS) tablet Take 1 tablet by mouth daily.    [provider]  omeprazole (PRILOSEC) 20 MG capsule Take 20 mg by mouth 2 (two) times daily. 05/09/16   [provider]  ondansetron (ZOFRAN-ODT) 4 MG disintegrating tablet Take 4 mg by mouth daily as needed for nausea or vomiting.  10/02/20   [provider]  Probiotic Product (CULTURELLE PROBIOTICS PO) Take 1 tablet by mouth daily.     [provider]  tamsulosin (FLOMAX) 0.4 MG CAPS capsule Take 1 capsule (0.4 mg total) by mouth in the morning and at bedtime. 01/16/21   McKenzie, Candee Furbish, MD    Allergies    Patient has no known allergies.  Review of Systems   Review of Systems  Gastrointestinal:  Positive for constipation.  All other systems reviewed and are negative.  Physical Exam Updated Vital Signs BP 124/74 (BP Location: Left Arm)   Pulse 98   Temp 97.7 F (36.5 C)  (Oral)   Resp 18   Ht 1.753 m (5\' 9" )   Wt 56.7 kg   SpO2 100%   BMI 18.46 kg/m   Physical Exam Vitals and nursing note reviewed.  Constitutional:      General: He is not in acute distress.    Appearance: He is well-developed.  HENT:     Head: Normocephalic and atraumatic.     Mouth/Throat:     Mouth: Mucous membranes are dry.     Pharynx: No oropharyngeal exudate.  Eyes:     General: No scleral icterus.       Right eye: No discharge.        Left eye: No discharge.     Conjunctiva/sclera: Conjunctivae normal.     Pupils: Pupils are equal, round, and reactive to light.  Neck:     Thyroid: No thyromegaly.     Vascular: No JVD.  Cardiovascular:     Rate and Rhythm: Normal rate and regular rhythm.     Heart sounds: Normal heart sounds. No murmur heard.   No friction rub. No gallop.  Pulmonary:     Effort: Pulmonary effort is normal. No respiratory distress.     Breath sounds: Normal breath sounds. No wheezing or rales.  Abdominal:     General: Bowel sounds are normal. There is no distension.     Palpations: Abdomen is soft. There is no mass.     Tenderness: There is abdominal tenderness. There is no guarding or rebound.     Hernia: No hernia is present.     Comments: Small amount of focal left lower quadrant tenderness but no guarding or peritoneal signs, no palpable mass, no pulsating mass  Musculoskeletal:        General: No tenderness. Normal range of motion.     Cervical back: Normal range of motion and neck supple.  Lymphadenopathy:     Cervical: No cervical adenopathy.  Skin:    General: Skin is warm and dry.     Findings: No erythema or rash.  Neurological:     Mental Status: He is alert.     Coordination: Coordination normal.  Psychiatric:        Behavior: Behavior normal.    ED Results /  Procedures / Treatments   Labs (all labs ordered are listed, but only abnormal results are displayed) Labs Reviewed  CBC - Abnormal; Notable for the following  components:      Result Value   WBC 16.2 (*)    RDW 15.9 (*)    Platelets 129 (*)    All other components within normal limits  COMPREHENSIVE METABOLIC PANEL - Abnormal; Notable for the following components:   Sodium 131 (*)    CO2 19 (*)    BUN 45 (*)    Creatinine, Ser 1.73 (*)    Calcium 8.8 (*)    Total Protein 6.2 (*)    Albumin 3.4 (*)    Total Bilirubin 1.3 (*)    GFR, Estimated 37 (*)    All other components within normal limits  URINALYSIS, ROUTINE W REFLEX MICROSCOPIC    EKG None  Radiology CT ABDOMEN PELVIS W CONTRAST  Result Date: 05/24/2021 CLINICAL DATA:  Constipation and lower abdominal pain for 3 days. Suspected diverticulitis. EXAM: CT ABDOMEN AND PELVIS WITH CONTRAST TECHNIQUE: Multidetector CT imaging of the abdomen and pelvis was performed using the standard protocol following bolus administration of intravenous contrast. CONTRAST:  42mL OMNIPAQUE IOHEXOL 300 MG/ML  SOLN COMPARISON:  04/23/2021 FINDINGS: Lower chest: Atelectasis in the lung bases. Cardiac enlargement. Coronary artery calcification. Small esophageal hiatal hernia. Motion artifact limits examination. Hepatobiliary: Motion artifact limits evaluation. Intra and extrahepatic bile duct dilatation. No cause identified but appearance is similar to prior study. Gallbladder is not abnormally distended. No focal liver lesions. Pancreas: Mild pancreatic ductal dilatation. No cause identified. No pancreatic infiltration or collections. Spleen: Small amount of subcapsular fluid along the lateral spleen, similar to prior study. Adrenals/Urinary Tract: No adrenal gland nodules. Multiple renal cysts. No hydronephrosis. Nephrograms are symmetrical. No bladder wall thickening or filling defect. Stomach/Bowel: Stomach, small bowel, and colon are not abnormally distended. Stool fills the colon. Postoperative changes suggesting partial right hemicolectomy and surgical anastomosis. No inflammatory changes demonstrated in the  sigmoid colon. There is circumferential wall thickening of the rectum with somewhat asymmetric appearance. This could represent proctocolitis. No mass was identified previously on a better distended rectum. Vascular/Lymphatic: Aortic atherosclerosis. No enlarged abdominal or pelvic lymph nodes. Reproductive: Prostate is unremarkable. Other: No free air or free fluid in the abdomen. Abdominal wall musculature appears intact. Previous right inguinal hernia repair. Musculoskeletal: Degenerative changes throughout the spine and hips. Compression of T12 vertebra appears chronic. No destructive bone lesions. IMPRESSION: 1. Persistent intra and extrahepatic bile duct dilatation is well as pancreatic ductal dilatation. Cause is not determined. Consider follow-up MRCP for further evaluation. 2. Wall thickening in the rectum may indicate proctocolitis. No evidence of diverticulitis. 3. Incidental findings include aortic atherosclerosis, multiple renal cysts, small esophageal hiatal hernia, degenerative changes in the spine. Electronically Signed   By: Lucienne Capers M.D.   On: 05/24/2021 17:23    Procedures Procedures   Medications Ordered in ED Medications  0.9 %  sodium chloride infusion ( Intravenous New Bag/Given 05/24/21 1540)  fentaNYL (SUBLIMAZE) injection 50 mcg (50 mcg Intravenous Given 05/24/21 1539)  magnesium citrate solution 1 Bottle (has no administration in time range)  ciprofloxacin (CIPRO) tablet 500 mg (has no administration in time range)  iohexol (OMNIPAQUE) 300 MG/ML solution 80 mL (80 mLs Intravenous Contrast Given 05/24/21 1704)    ED Course  I have reviewed the triage vital signs and the nursing notes.  Pertinent labs & imaging results that were available during my care of the  patient were reviewed by me and considered in my medical decision making (see chart for details).    MDM Rules/Calculators/A&P                          Though constipation may be the straightforward answer  the patient has a borderline tachycardia appears dehydrated and has progressive left lower quadrant pain thus I will ensure that he does not have diverticulitis or complication of other intra-abdominal pathology.  CT scan ordered, labs and fluids ordered, patient agreeable to the plan.  At this time he does not have a surgical abdomen  CT scan shows that he has some proctitis, diffuse constipation and some other minor findings.  I have printed off a copy of the CT scan for the patient to have at his facility, he can be referred to his local family doctor or GI things or not improving on ciprofloxacin and anticonstipation medications, given magnesium citrate prior to discharge.  Patient appears stable for discharge without any surgical findings  Final Clinical Impression(s) / ED Diagnoses Final diagnoses:  Acute proctitis  Constipation, unspecified constipation type    Rx / DC Orders ED Discharge Orders          Ordered    ciprofloxacin (CIPRO) 500 MG tablet  2 times daily        05/24/21 1752             Noemi Chapel, MD 05/24/21 1754

## 2021-06-26 ENCOUNTER — Emergency Department (HOSPITAL_COMMUNITY)
Admission: EM | Admit: 2021-06-26 | Discharge: 2021-06-26 | Disposition: A | Discharge status: Home / Self Care | Payer: Medicare HMO | Source: Home / Self Care | Attending: Emergency Medicine | Admitting: Emergency Medicine

## 2021-06-26 ENCOUNTER — Emergency Department (HOSPITAL_COMMUNITY)
Admission: EM | Admit: 2021-06-26 | Discharge: 2021-07-13 | Disposition: E | Payer: Medicare HMO | Attending: Emergency Medicine | Admitting: Emergency Medicine

## 2021-06-26 ENCOUNTER — Encounter (HOSPITAL_COMMUNITY): Payer: Self-pay | Admitting: Hematology

## 2021-06-26 DIAGNOSIS — I469 Cardiac arrest, cause unspecified: Secondary | ICD-10-CM | POA: Insufficient documentation

## 2021-06-26 DIAGNOSIS — I129 Hypertensive chronic kidney disease with stage 1 through stage 4 chronic kidney disease, or unspecified chronic kidney disease: Secondary | ICD-10-CM | POA: Insufficient documentation

## 2021-06-26 DIAGNOSIS — W228XXA Striking against or struck by other objects, initial encounter: Secondary | ICD-10-CM | POA: Diagnosis not present

## 2021-06-26 DIAGNOSIS — Z8616 Personal history of COVID-19: Secondary | ICD-10-CM | POA: Diagnosis not present

## 2021-06-26 DIAGNOSIS — Z87891 Personal history of nicotine dependence: Secondary | ICD-10-CM | POA: Diagnosis not present

## 2021-06-26 DIAGNOSIS — Z7982 Long term (current) use of aspirin: Secondary | ICD-10-CM | POA: Insufficient documentation

## 2021-06-26 DIAGNOSIS — N1831 Chronic kidney disease, stage 3a: Secondary | ICD-10-CM | POA: Diagnosis not present

## 2021-06-26 DIAGNOSIS — S0181XA Laceration without foreign body of other part of head, initial encounter: Secondary | ICD-10-CM | POA: Insufficient documentation

## 2021-06-26 DIAGNOSIS — S0990XA Unspecified injury of head, initial encounter: Secondary | ICD-10-CM | POA: Diagnosis present

## 2021-06-26 DIAGNOSIS — Z85038 Personal history of other malignant neoplasm of large intestine: Secondary | ICD-10-CM | POA: Diagnosis not present

## 2021-06-26 DIAGNOSIS — E1122 Type 2 diabetes mellitus with diabetic chronic kidney disease: Secondary | ICD-10-CM | POA: Insufficient documentation

## 2021-06-26 DIAGNOSIS — Z8546 Personal history of malignant neoplasm of prostate: Secondary | ICD-10-CM | POA: Insufficient documentation

## 2021-06-26 DIAGNOSIS — R799 Abnormal finding of blood chemistry, unspecified: Secondary | ICD-10-CM | POA: Insufficient documentation

## 2021-06-26 DIAGNOSIS — Z5321 Procedure and treatment not carried out due to patient leaving prior to being seen by health care provider: Secondary | ICD-10-CM | POA: Insufficient documentation

## 2021-06-26 MED ORDER — EPINEPHRINE 1 MG/10ML IJ SOSY
PREFILLED_SYRINGE | INTRAMUSCULAR | Status: AC | PRN
  Administered 2021-06-26: 1 mg via INTRAVENOUS

## 2021-06-29 LAB — CBG MONITORING, ED
Glucose-Capillary: 142 mg/dL — ABNORMAL HIGH (ref 70–99)
Glucose-Capillary: 142 mg/dL — ABNORMAL HIGH (ref 70–99)

## 2021-06-30 MED FILL — Medication: Qty: 1 | Status: AC
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
## 2021-07-13 NOTE — ED Provider Notes (Signed)
[facility] EMERGENCY DEPARTMENT Provider Note   CSN: [id number] Arrival date & time: 2021/07/26  1409     History No chief complaint on file.   Tony Holt is a 87 y.o. male.  HPI   This patient is an unfortunate 87 year old male with a history of diabetes Colon Ca and hypertension  he comes into the hospital by EMS transport after he was found to collapse on the front yard of the doctor's office where he was going for a visit.  There is no family here at this time, EMS was on the scene almost simultaneously with the collapse, they arrived at 1:49 PM and started CPR immediately as the patient was not breathing, they placed an esophageal airway and gave the patient assisted ventilations.  He had initial rhythm of wide-complex PEA and CPR was initiated, 3 doses of epinephrine were given through the left proximal humeral intraosseous line.  There was no return of spontaneous regulation.  The patient is not able to give any information, level 5 caveat applies  Past Medical History:  Diagnosis Date   BPH (benign prostatic hyperplasia)    Colon cancer ([place]) 1999   Dysrhythmia    new onset A Fib   Essential hypertension    GERD (gastroesophageal reflux disease)    Hard of hearing    Prostate cancer [facility])     Patient Active Problem List   Diagnosis Date Noted   Elevated d-dimer 04/16/2021   Elevated troponin 04/16/2021   Elevated brain natriuretic peptide (BNP) level 04/16/2021   Shortness of breath 04/15/2021   COVID-19 virus infection 11/19/2020   GERD without esophagitis 32/44/0102   Acute metabolic encephalopathy 72/53/6644   Protein-calorie malnutrition ([place]) 11/19/2020   BPH without obstruction/lower urinary tract symptoms 10/15/2020   Nocturia 10/15/2020   Pelvis fracture, right, closed, initial encounter ([place]) 10/01/2020   Lobar pneumonia ([place]) 09/03/2020   Acute respiratory failure with hypoxia ([place]) 09/03/2020   Acute renal failure superimposed on stage 3a chronic  kidney disease ([place]) 09/03/2020   Respiratory failure with hypoxia ([place]) 09/02/2020   CAP (community acquired pneumonia)    Elevated PSA 04/28/2020   AKI (acute kidney injury) ([place]) 02/23/2020   Prostate cancer ([place]) 02/22/2020   Atrial fibrillation, chronic ([place]) 02/22/2020   Cecal cancer ([place])    Iron deficiency anemia due to chronic blood loss 05/17/2019   Constipation 05/17/2019   Bradycardia 05/28/2016   Generalized weakness 05/28/2016   Acute kidney injury superimposed on CKD ([place]) 05/28/2016   Hyperkalemia 05/28/2016   Nausea alone 03/24/2012   Abdominal pain, epigastric 03/24/2012   Essential hypertension 03/24/2012   ANEMIA 09/16/2010   HEMOCCULT POSITIVE STOOL 09/16/2010    Past Surgical History:  Procedure Laterality Date   BIOPSY  12/28/2019   Procedure: BIOPSY;  Surgeon: Daneil Dolin, MD;  Location: [facility] ENDO SUITE;  Service: Endoscopy;;  gastric, esophagus,cecal   CATARACT EXTRACTION W/PHACO  01/18/2013   Procedure: CATARACT EXTRACTION PHACO AND INTRAOCULAR LENS PLACEMENT ([place]);  Surgeon: Tonny Branch, MD;  Location: [facility] ORS;  Service: Ophthalmology;  Laterality: Left;  CDE 17.11   CATARACT EXTRACTION W/PHACO Right 02/05/2013   Procedure: CATARACT EXTRACTION PHACO AND INTRAOCULAR LENS PLACEMENT (IOC);  Surgeon: Tonny Branch, MD;  Location: [facility] ORS;  Service: Ophthalmology;  Laterality: Right;  CDE:19.49   COLON SURGERY  1999   Sigmoid colon cancer, segmental resection by Drs. Smith/Bradford   COLONOSCOPY  2002   Multiple adenomatous colon polyps   COLONOSCOPY N/A 12/28/2019   Procedure:  COLONOSCOPY;  Surgeon: Daneil Dolin, MD;  Location: [facility] ENDO SUITE;  Service: Endoscopy;  Laterality: N/A;   ESOPHAGOGASTRODUODENOSCOPY  03/25/2012   Dr. Gala Romney: Severe ulcerative/erosive reflux esophagitis.  Small hiatal hernia.  Abnormal gastric and duodenal mucosa, biopsy showed reactive changes and minimal chronic inflammation but no H. pylori.   ESOPHAGOGASTRODUODENOSCOPY N/A 12/28/2019    Procedure: ESOPHAGOGASTRODUODENOSCOPY (EGD);  Surgeon: Daneil Dolin, MD;  Location: [facility] ENDO SUITE;  Service: Endoscopy;  Laterality: N/A;   HERNIA REPAIR  02/2011   Right inguinal hernia repair with mesh, Dr. Geroge Baseman   HERNIA REPAIR     three total   PARTIAL COLECTOMY Right 02/04/2020   Procedure: RIGHT HEMI-COLECTOMY;  Surgeon: Virl Cagey, MD;  Location: [facility] ORS;  Service: General;  Laterality: Right;   POLYPECTOMY  12/28/2019   Procedure: POLYPECTOMY;  Surgeon: Daneil Dolin, MD;  Location: [facility] END[facility];  Service: Endoscopy;;  rectal        Family History  Problem Relation Age of Onset   Heart attack Father        Deceased, age 71s   Diabetes Brother    Colon cancer Neg Hx    Liver disease Neg Hx    Breast cancer Neg Hx    Prostate cancer Neg Hx    Pancreatic cancer Neg Hx     Social History   Tobacco Use   Smoking status: Former    Packs/day: 1.00    Years: 50.00    Pack years: 50.00    Types: Cigarettes    Quit date: 12/13/1989    Years since quitting: 31.5   Smokeless tobacco: Current    Types: Snuff  Vaping Use   Vaping Use: Never used  Substance Use Topics   Alcohol use: No    Alcohol/week: 0.0 standard drinks    Comment: previous heavy drinker at age 65-32   Drug use: No    Home Medications Prior to Admission medications   Medication Sig Start Date End Date Taking? Authorizing Provider  albuterol (VENTOLIN HFA) 108 (90 Base) MCG/ACT inhaler Inhale 2 puffs into the lungs every 6 (six) hours as needed for wheezing or shortness of breath. 11/23/20   Thurnell Lose, MD  Aspirin 81 MG CAPS Take 81 mg by mouth daily. 09/04/20   Orson Eva, MD  clonazePAM (KLONOPIN) 0.5 MG tablet Take 0.5 mg by mouth daily. 11/28/20   [provider]  diphenhydramine-acetaminophen (TYLENOL PM) 25-500 MG TABS tablet Take 4 tablets by mouth at bedtime.    [provider]  docusate sodium (COLACE) 100 MG capsule Take 1 capsule (100 mg total) by mouth 2  (two) times daily as needed for mild constipation. 02/07/20   Virl Cagey, MD  Ensure (ENSURE) Take 237 mLs by mouth in the morning, at noon, and at bedtime.     [provider]  ferrous sulfate 325 (65 FE) MG tablet Take 1 tablet (325 mg total) by mouth daily with breakfast. 04/19/21   Raiford Noble Latif, DO  fluconazole (DIFLUCAN) 100 MG tablet Take 1 tablet (100 mg total) by mouth daily. 2 tablets day one 04/30/21   Derek Jack, MD  guaiFENesin (MUCINEX) 600 MG 12 hr tablet Take 600 mg by mouth 2 (two) times daily.    [provider]  HYDROcodone-acetaminophen (NORCO) 7.5-325 MG tablet Take 1 tablet by mouth in the morning, at noon, in the evening, and at bedtime.  10/01/20   [provider]  mirabegron ER (MYRBETRIQ) 50  MG TB24 tablet Take 1 tablet (50 mg total) by mouth daily. 01/16/21   McKenzie, Candee Furbish, MD  Multiple Vitamins-Minerals (MULTIVITAMIN WITH MINERALS) tablet Take 1 tablet by mouth daily.    [provider]  omeprazole (PRILOSEC) 20 MG capsule Take 20 mg by mouth 2 (two) times daily. 05/09/16   [provider]  ondansetron (ZOFRAN-ODT) 4 MG disintegrating tablet Take 4 mg by mouth daily as needed for nausea or vomiting.  10/02/20   [provider]  Probiotic Product (CULTURELLE PROBIOTICS PO) Take 1 tablet by mouth daily.     [provider]  tamsulosin (FLOMAX) 0.4 MG CAPS capsule Take 1 capsule (0.4 mg total) by mouth in the morning and at bedtime. 01/16/21   McKenzie, Candee Furbish, MD    Allergies    Patient has no known allergies.  Review of Systems   Review of Systems  Unable to perform ROS: Patient unresponsive   Physical Exam Updated Vital Signs There were no vitals taken for this visit.  Physical Exam  Physical Exam Updated Vital Signs Ht 1.753 m (_0 )   Wt 68.5 kg   BMI 22.30 kg/m   Physical Exam Constitutional:      Comments: Unresponsive, chronically ill-appearing  HENT:      Head:     Comments: Hematoma and laceration to the right forehead and periorbital region    Nose: Nose normal.     Mouth/Throat:     Mouth: Mucous membranes are moist.     Comments: Vomitus present in the oropharynx Eyes:     Comments: Pupils are 2 mm symmetrical and nonreactive  Cardiovascular:     Comments: Electrical rhythm appears to be consistent with a junctional bradycardia, there are no pulses Pulmonary:     Comments: No spontaneous respiratory effort, requiring mechanical ventilation Abdominal:     Comments: Soft abdomen, no masses palpated  Musculoskeletal:     Cervical back: Normal range of motion and neck supple.     Comments: No obvious injury to the 4 extremities, intraosseous line present in the left proximal humerus  Skin:    Comments: Hematoma and laceration to the right forehead as described above, otherwise no signs of significant injury  Neurological:     Comments: GCS of 3   ED Results / Procedures / Treatments   Labs (all labs ordered are listed, but only abnormal results are displayed) Labs Reviewed - No data to display  EKG None  Radiology No results found.  Procedures .Critical Care  Date/Time: 07/19/21 2:56 PM Performed by: Noemi Chapel, MD Authorized by: Noemi Chapel, MD   Critical care provider statement:    Critical care time (minutes):  35   Critical care time was exclusive of:  Separately billable procedures and treating other patients and teaching time   Critical care was necessary to treat or prevent imminent or life-threatening deterioration of the following conditions:  Cardiac failure and respiratory failure   Critical care was time spent personally by me on the following activities:  Blood draw for specimens, development of treatment plan with patient or surrogate, discussions with consultants, evaluation of patient's response to treatment, examination of patient, obtaining history from patient or surrogate, ordering and performing  treatments and interventions, ordering and review of laboratory studies, ordering and review of radiographic studies, pulse oximetry, re-evaluation of patient's condition and review of old charts Comments:       Procedure Name: Intubation Date/Time: 2021-07-19 2:57 PM Performed by: Noemi Chapel, MD Pre-anesthesia  Checklist: Patient identified, Patient being monitored and Suction available Oxygen Delivery Method: Non-rebreather mask Preoxygenation: Pre-oxygenation with 100% oxygen Ventilation: Mask ventilation without difficulty Laryngoscope Size: Mac and 4 Grade View: Grade III Tube size: 7.5 mm Number of attempts: 1 Airway Equipment and Method: Stylet (direct stylet) Placement Confirmation: ETT inserted through vocal cords under direct vision, CO2 detector and Breath sounds checked- equal and bilateral Secured at: 23 cm Tube secured with: ETT holder Dental Injury: Teeth and Oropharynx as per pre-operative assessment  Difficulty Due To: Difficulty was unanticipated Comments:       CPR  Date/Time: 13-Jul-2021 2:59 PM Performed by: Noemi Chapel, MD Authorized by: Noemi Chapel, MD  CPR Procedure Details:    ACLS/BLS initiated by EMS: Yes     CPR/ACLS performed in the ED: Yes     Duration of CPR (minutes):  10   Outcome: Pt declared dead    CPR performed via ACLS guidelines under my direct supervision.  See RN documentation for details including defibrillator use, medications, doses and timing. Comments:         Medications Ordered in ED Medications - No data to display  ED Course  I have reviewed the triage vital signs and the nursing notes.  Pertinent labs & imaging results that were available during my care of the patient were reviewed by me and considered in my medical decision making (see chart for details).    MDM Rules/Calculators/A&P                           The patient arrives by EMS, unfortunately they state that when they put him on the stretcher and  removing the stretcher the stretcher tipped over and the patient hit the concrete with his head.  There is a hematoma to the right side of the head.  This patient is completely unresponsive and after another 20 minutes of resuscitative efforts in the emergency department there was no return of spontaneous circulation.  We continued CPR, I personally directed CPR, more medications were given including epinephrine times multiple times, his CBG was checked and was normal, I intubated the patient with a formal endotracheal tube, none of these things change in the patient remained in pulseless electrical activity with no pulses and no cardiac activity visualized.  With approximately 40 minutes of downtime and no return of spontaneous regulation the patient was declared dead at 2:25 PM.  Will discuss with the medical examiner given the accidental trauma prehospital.  The case has been discussed with the medical examiner who is going to discuss the case with his [place] contacts to determine whether this will be an ME case - change of shift - care signed out to Dr. Karle Starch to f/u this recommendation -   I have informed family of the outcomes and what happened, the patient's family doctor, Dr. Karie Kirks actually saw the patient in the office and recommended he come to the emergency department immediately as he had been having chest pain just prior to his visit.  He collapsed on the way out of the office.  Dr. Karie Kirks will sign the death certificate if this is not a medical examiner's case  Final Clinical Impression(s) / ED Diagnoses Final diagnoses:  Cardiac arrest [facility])     Noemi Chapel, MD 2021/07/13 1541

## 2021-07-13 NOTE — ED Notes (Signed)
CBG 142

## 2021-07-13 NOTE — ED Notes (Signed)
Pt to the ED CPR in progress with Linton Rump from Dr. Cloria Spring office.Pt received 3 EPI's in route last at 1403 and reportedly in PEA. Vital signs unattainable at this time.  Pt has a right side head wound with bleeding and hematoma due to the stretcher tipping over with EMS.  Spouse was with patient when EMS arrived.

## 2021-07-13 NOTE — ED Notes (Addendum)
Charting on this patient in regards to CPR in progress was entered in error. The wrong pt was arrived by registration. The CBG that has resulted is also in the incorrect chart.

## 2021-07-13 DEATH — deceased

## 2021-08-07 ENCOUNTER — Ambulatory Visit: Payer: Medicare HMO | Admitting: Urology

## 2021-09-01 IMAGING — CT CT CHEST W/ CM
2 of 5 series · 12 of 36 positions shown, 15 images · IV contrast (Omnipaque or Isovue)
Comparison: Abdominopelvic CT 03/23/2012. MRCP 03/24/2012.

CLINICAL DATA: Staging ascending colon cancer. Preoperative
assessment.

EXAM:
CT CHEST, ABDOMEN, AND PELVIS WITH CONTRAST
TECHNIQUE: Multidetector CT imaging of the chest, abdomen and pelvis was
performed following the standard protocol during bolus
administration of intravenous contrast.
CONTRAST:  80mL OMNIPAQUE IOHEXOL 300 MG/ML  SOLN

[Series 2: cap with · axial · 0.66mm/px · z∈[+797,+1287]mm · 9 of 124 slices shown, 12 images]
[im 13/124  mediastinal]
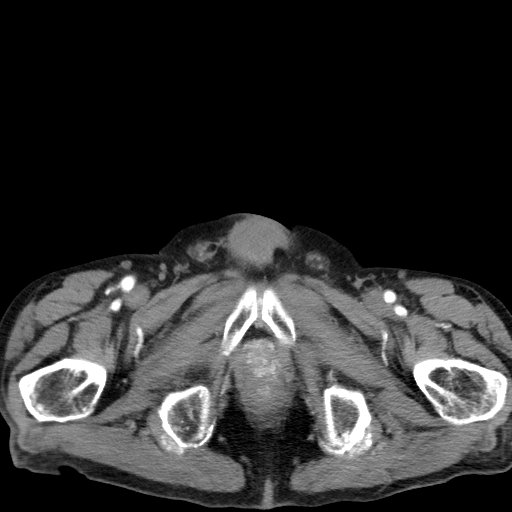
[im 13/124  lung]
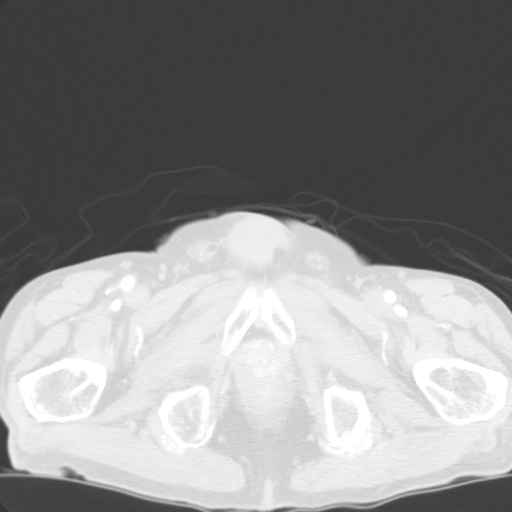
[im 25/124  lung]
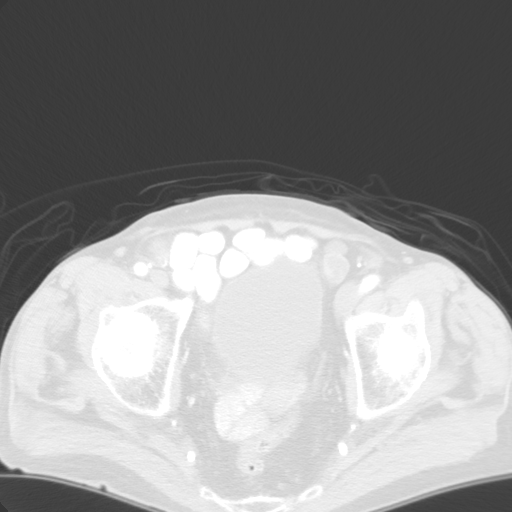
[im 37/124  lung]
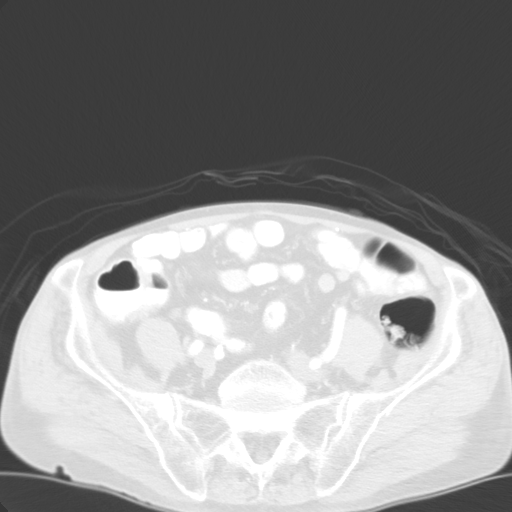
[im 50/124  lung]
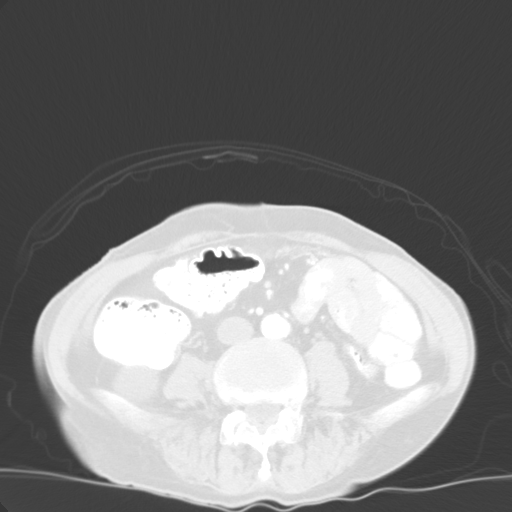
[im 62/124  mediastinal]
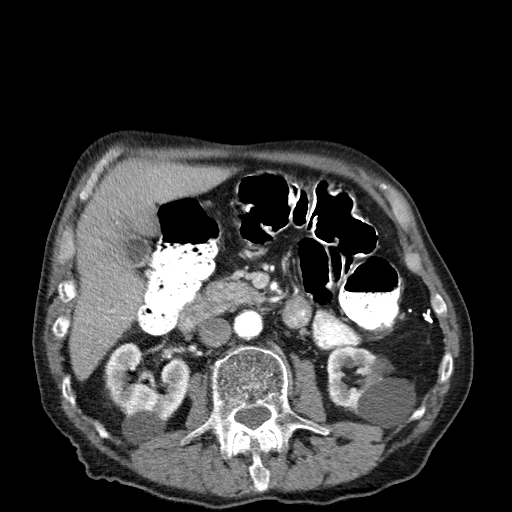
[im 62/124  lung]
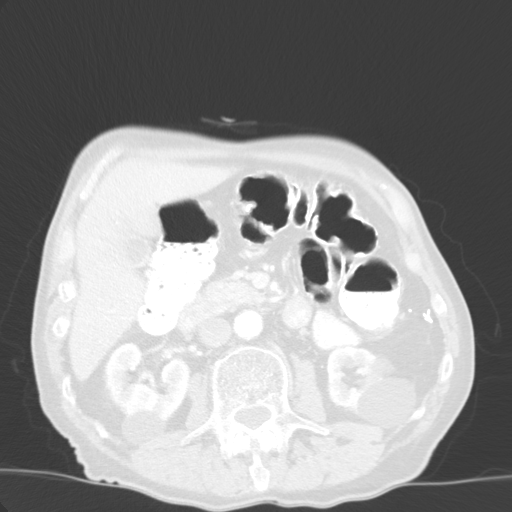
[im 74/124  lung]
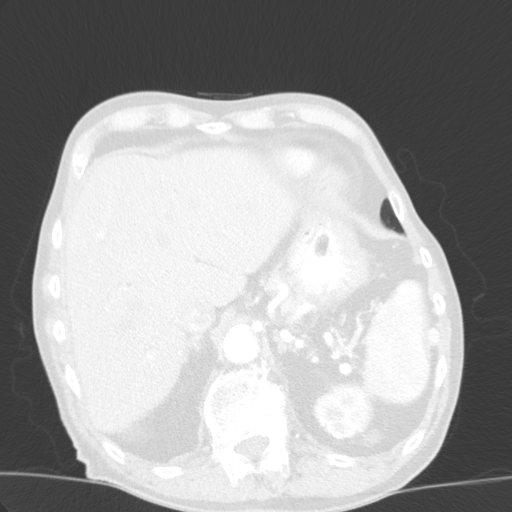
[im 87/124  lung]
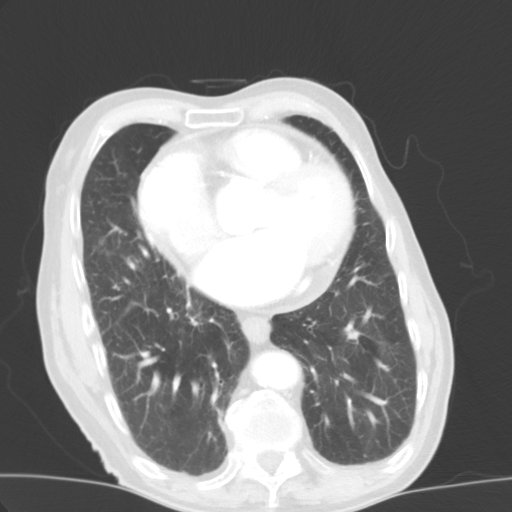
[im 99/124  lung]
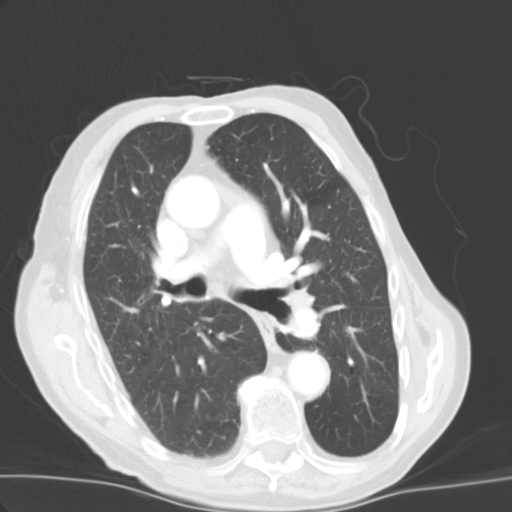
[im 111/124  mediastinal]
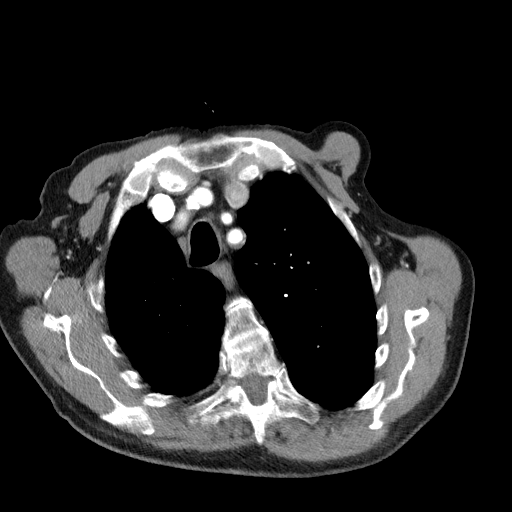
[im 111/124  lung]
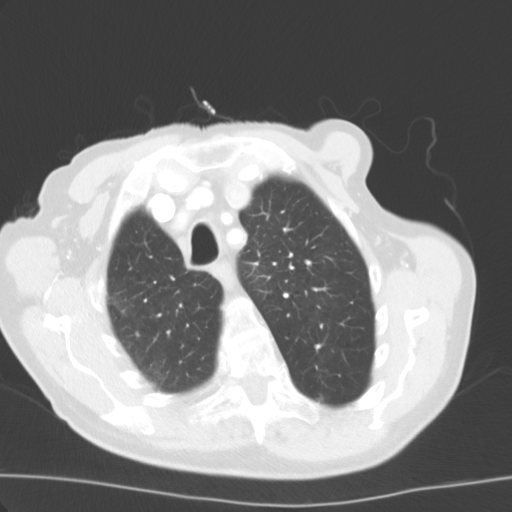

[Series 5: coronals · coronal · 0.72mm/px · 3 of 163 slices shown]
[im 33/163  lung]
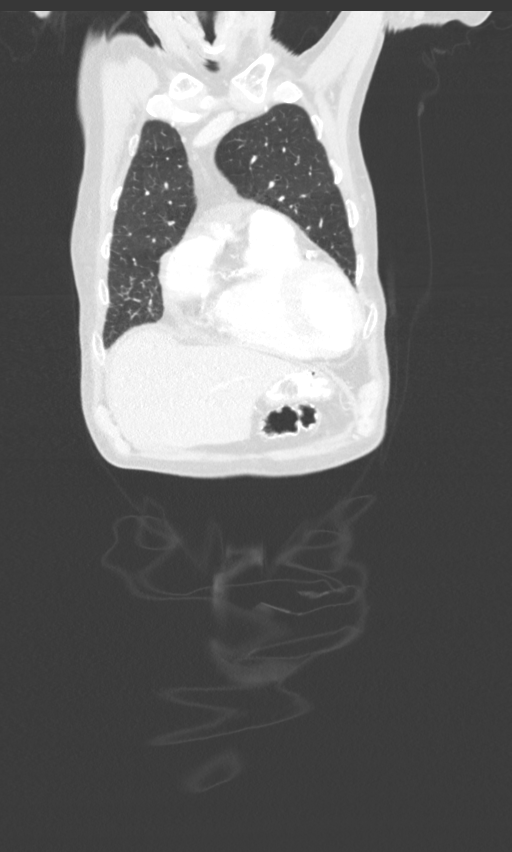
[im 65/163  lung]
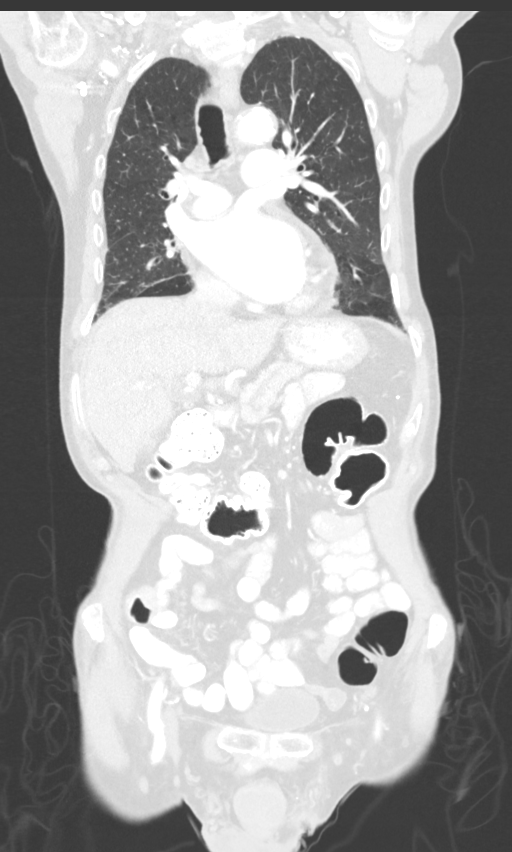
[im 98/163  lung]
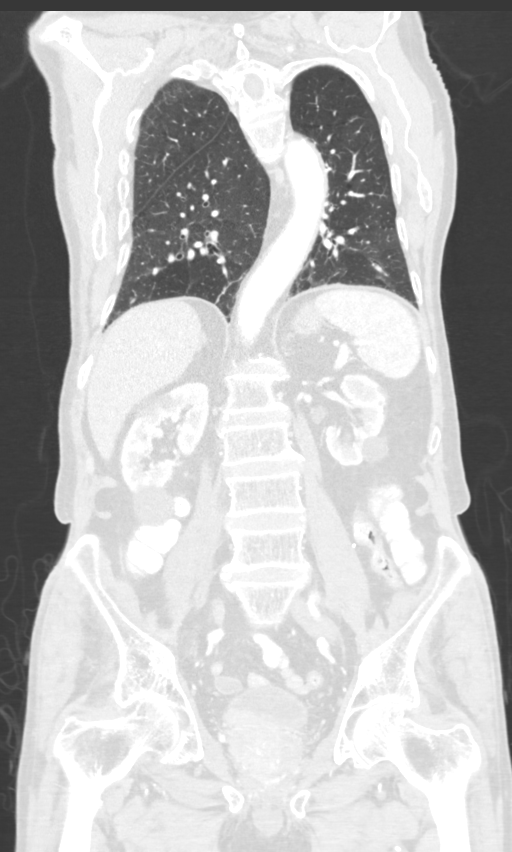

[12 of 36 positions shown; findings below may reference images not displayed]

FINDINGS: CT CHEST FINDINGS

Cardiovascular: Atherosclerosis of the aorta, great vessels and
coronary arteries. No acute vascular findings. The heart is mildly
enlarged. There is a trace pericardial effusion.

Mediastinum/Nodes: There are no enlarged mediastinal, hilar or
axillary lymph nodes. Mild distal esophageal wall thickening without
distension. The trachea and thyroid gland appear unremarkable.

Lungs/Pleura: No pleural effusion or pneumothorax. Mild
centrilobular emphysema with central airway thickening and scattered
subpleural reticulation. There is mild scarring at both lung apices.
No suspicious pulmonary nodule.

Musculoskeletal/Chest wall: There is a new anterior compression
deformity involving the T12 vertebral body, resulting in
approximately 40% loss of vertebral body height. There is no
paraspinal edema. No other focal osseous abnormalities are
identified. There is a mild thoracolumbar scoliosis.

CT ABDOMEN AND PELVIS FINDINGS

Hepatobiliary: The liver is normal in density without suspicious
focal abnormality. There is intrahepatic and extrahepatic biliary
dilatation, similar to the previous study. The common bile duct
measures up to 9 mm in diameter. The gallbladder is incompletely
distended. No evidence of gallstones or gallbladder wall thickening.

Pancreas: The pancreas appears stable with mild ductal dilatation in
the pancreatic head. No evidence of pancreatic head mass,
surrounding inflammation or fluid collection.

Spleen: Normal in size without focal abnormality.

Adrenals/Urinary Tract: Both adrenal glands appear normal. There are
bilateral renal cysts. Thin mural calcification has developed within
a 1.6 cm cyst in the interpolar region of the left kidney. No solid
components are identified. There is no evidence of urinary tract
calculus or hydronephrosis. No definite intrinsic bladder
abnormality. There is an asymmetric mass involving the bladder base
on the right which appears contiguous with the prostate gland,
further described below.

Stomach/Bowel: The stomach and proximal small bowel appear normal.
There is no bowel distension. There is irregular wall thickening
involving the medial wall of the cecum, extending into the terminal
ileum consistent with the patient's known malignancy. No evidence of
resulting bowel obstruction. The appendix is not distended. No other
colonic abnormalities are seen.

Vascular/Lymphatic: There are no enlarged abdominal or pelvic lymph
nodes. Aortic and branch vessel atherosclerosis. No acute vascular
findings. The portal, superior mesenteric and splenic veins are
patent.

Reproductive: There is an asymmetric mass involving the right lobe
of the prostate gland, measuring 5.0 x 3.8 cm on image 107/2. This
measures 4.7 cm on sagittal image 94/6. This demonstrates
asymmetrically increased enhancement which extends into the right
seminal vesicle and bladder base on the right. No evidence of
ureteral obstruction.

Other: Postsurgical changes in the right groin consistent with prior
inguinal herniorrhaphy. There are scattered surgical clips in the
left upper quadrant. No ascites or peritoneal nodularity.

Musculoskeletal: No acute or significant osseous findings.
IMPRESSION: 1. Irregular wall thickening involving the medial wall of the cecum
extending into the terminal ileum consistent with the patient's
known malignancy. No evidence of bowel obstruction, perforation or
metastatic disease.
2. Asymmetric mass involving the right lobe of the prostate gland
with extension into the right seminal vesicle and bladder base,
suspicious for prostate cancer with local invasion. Recommend
correlation with serum PSA levels and urology evaluation.
3. Stable biliary dilatation, likely physiologic based on
chronicity.
4. Bilateral renal cysts.
5. Age-indeterminate T12 compression deformity.
6. Aortic Atherosclerosis ([facility]).

## 2021-09-01 IMAGING — CT CT ABD-PELV W/ CM
2 of 5 series · 12 of 36 positions shown, 15 images · IV contrast (Omnipaque or Isovue)
Comparison: Abdominopelvic CT 03/23/2012. MRCP 03/24/2012.

CLINICAL DATA: Staging ascending colon cancer. Preoperative
assessment.

EXAM:
CT CHEST, ABDOMEN, AND PELVIS WITH CONTRAST
TECHNIQUE: Multidetector CT imaging of the chest, abdomen and pelvis was
performed following the standard protocol during bolus
administration of intravenous contrast.
CONTRAST:  80mL OMNIPAQUE IOHEXOL 300 MG/ML  SOLN

[Series 2: cap with · axial · 0.66mm/px · z∈[+797,+1287]mm · 9 of 124 slices shown, 12 images]
[im 13/124  mediastinal]
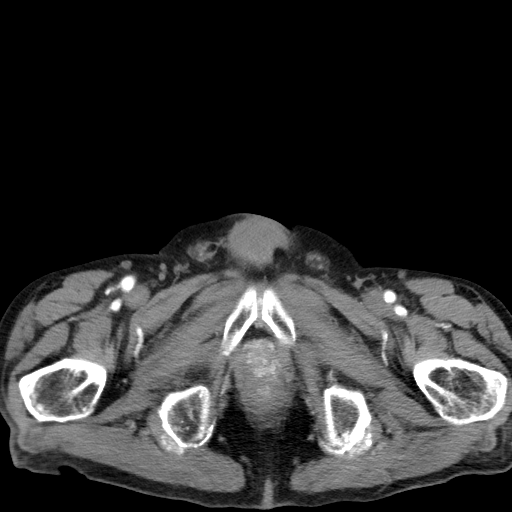
[im 13/124  lung]
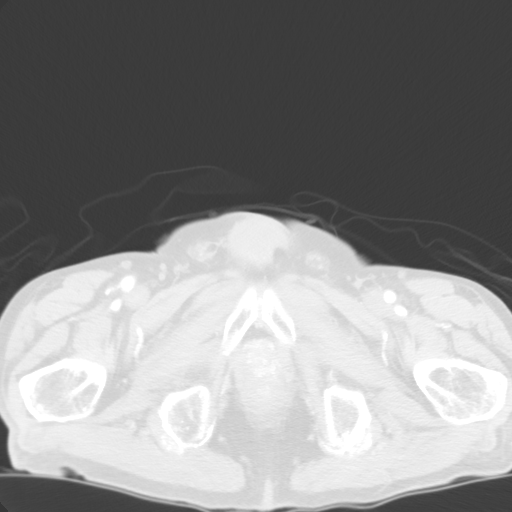
[im 25/124  lung]
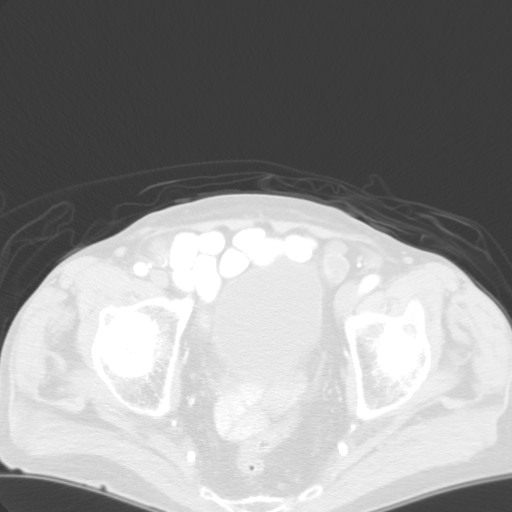
[im 37/124  lung]
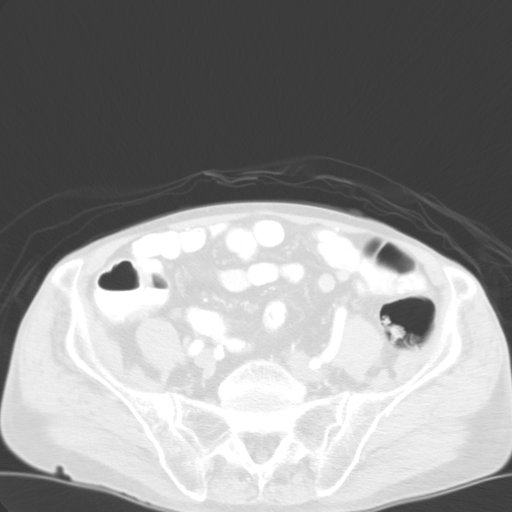
[im 50/124  lung]
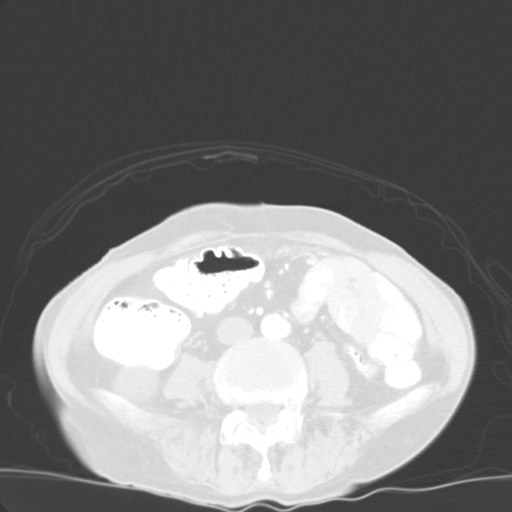
[im 62/124  mediastinal]
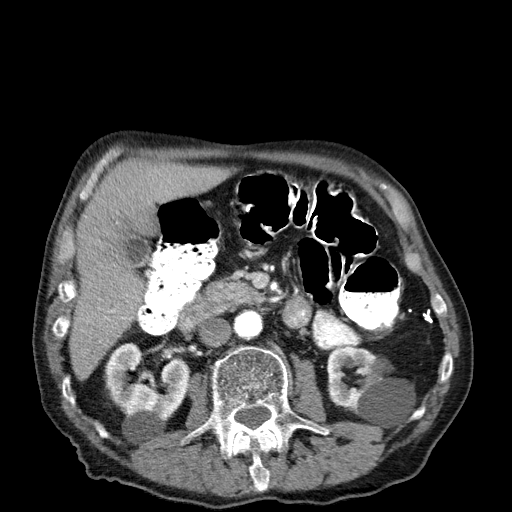
[im 62/124  lung]
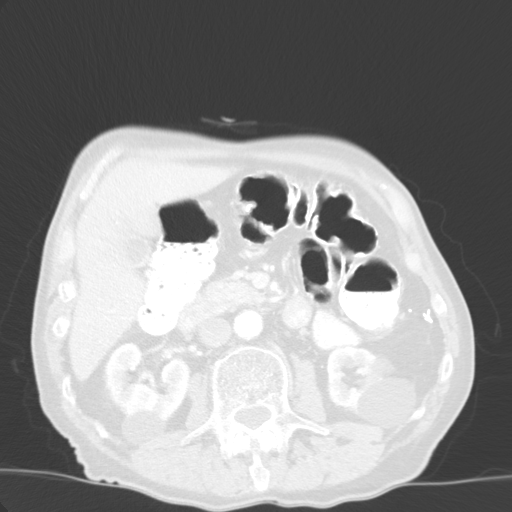
[im 74/124  lung]
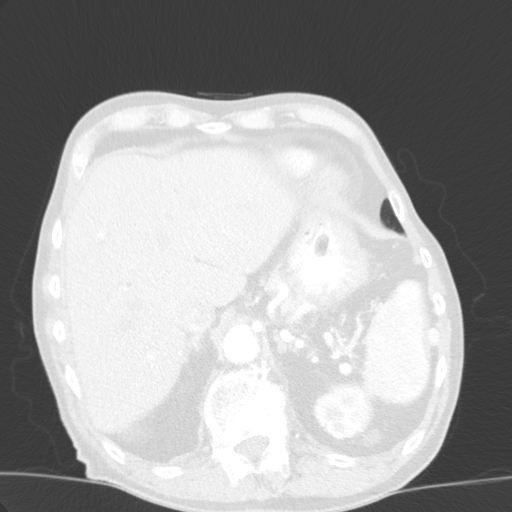
[im 87/124  lung]
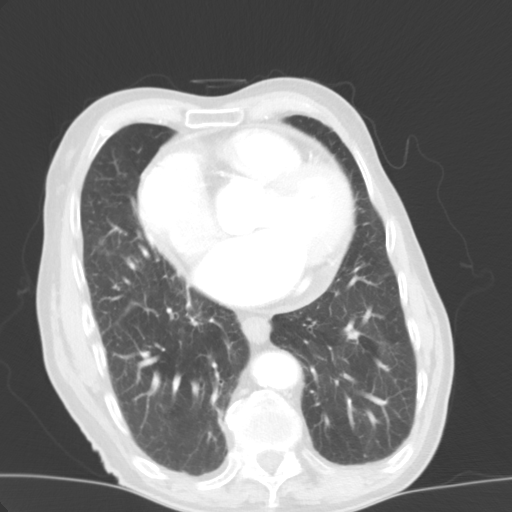
[im 99/124  lung]
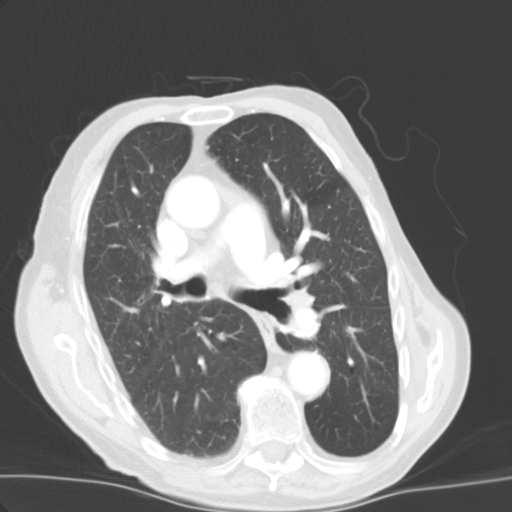
[im 111/124  mediastinal]
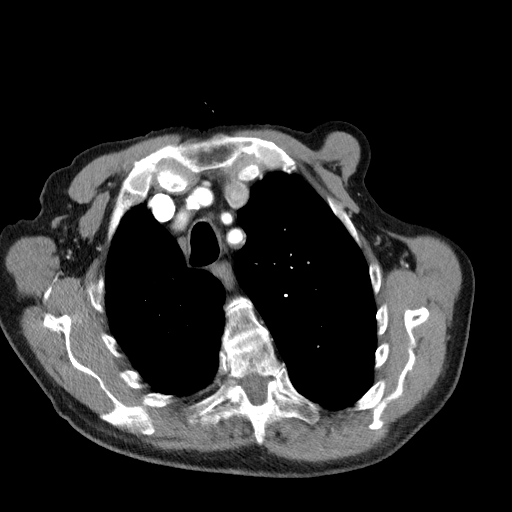
[im 111/124  lung]
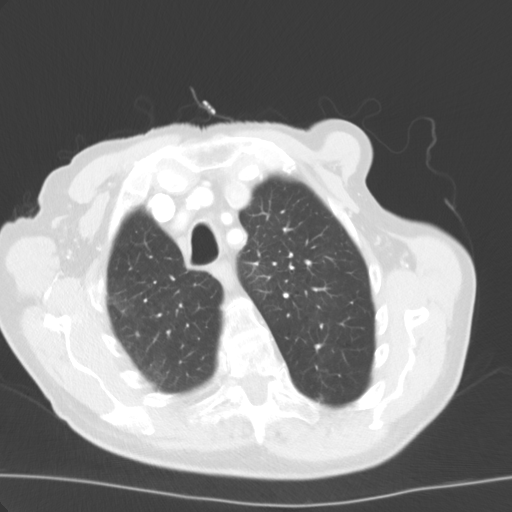

[Series 5: coronals · coronal · 0.72mm/px · 3 of 163 slices shown]
[im 33/163  lung]
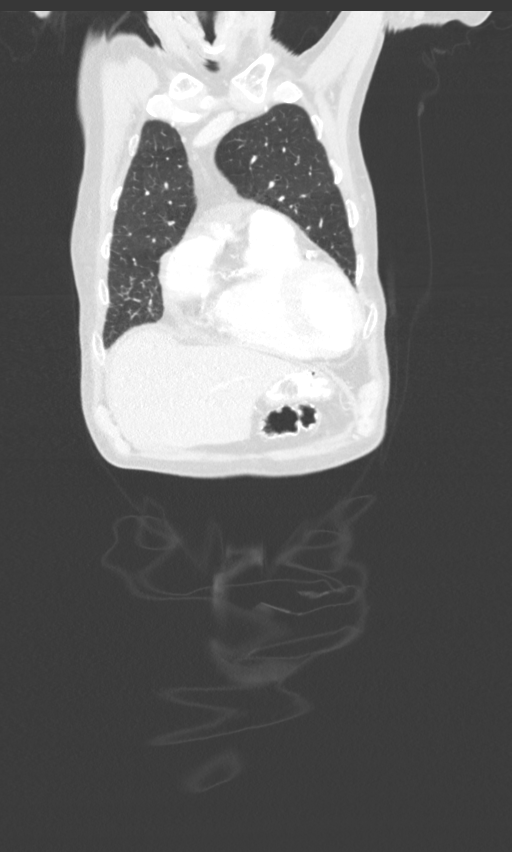
[im 65/163  lung]
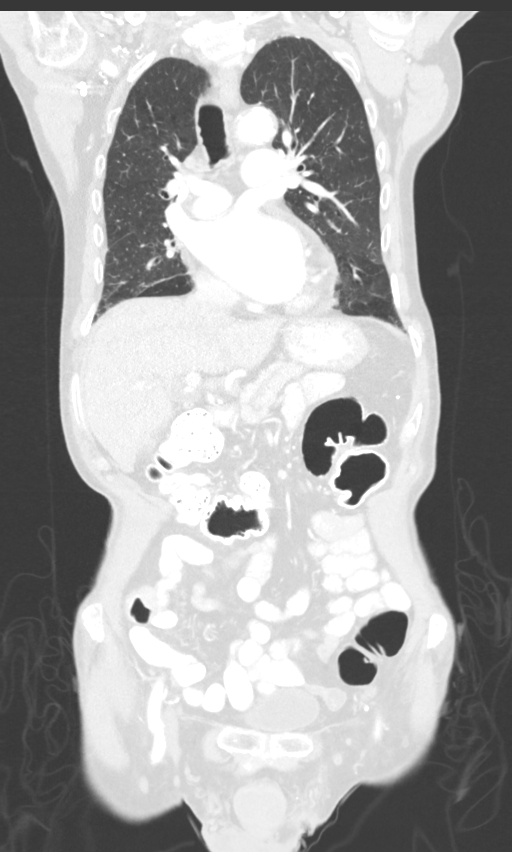
[im 98/163  lung]
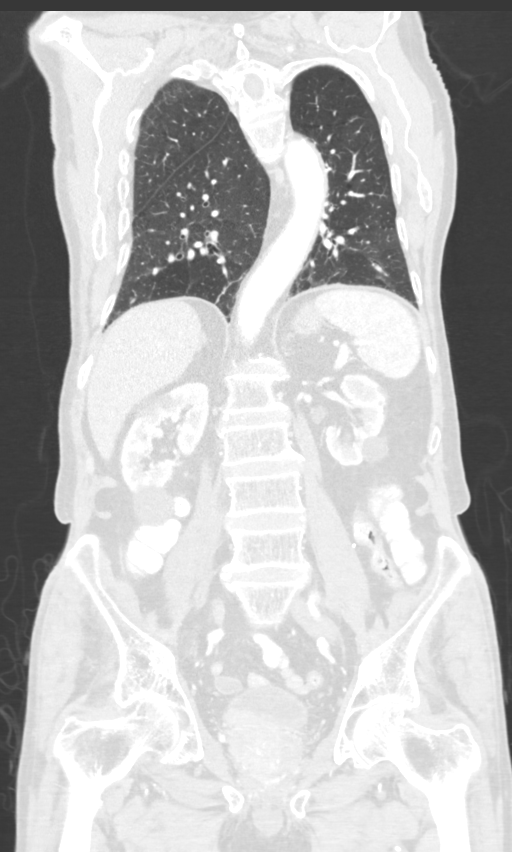

[12 of 36 positions shown; findings below may reference images not displayed]

FINDINGS: CT CHEST FINDINGS

Cardiovascular: Atherosclerosis of the aorta, great vessels and
coronary arteries. No acute vascular findings. The heart is mildly
enlarged. There is a trace pericardial effusion.

Mediastinum/Nodes: There are no enlarged mediastinal, hilar or
axillary lymph nodes. Mild distal esophageal wall thickening without
distension. The trachea and thyroid gland appear unremarkable.

Lungs/Pleura: No pleural effusion or pneumothorax. Mild
centrilobular emphysema with central airway thickening and scattered
subpleural reticulation. There is mild scarring at both lung apices.
No suspicious pulmonary nodule.

Musculoskeletal/Chest wall: There is a new anterior compression
deformity involving the T12 vertebral body, resulting in
approximately 40% loss of vertebral body height. There is no
paraspinal edema. No other focal osseous abnormalities are
identified. There is a mild thoracolumbar scoliosis.

CT ABDOMEN AND PELVIS FINDINGS

Hepatobiliary: The liver is normal in density without suspicious
focal abnormality. There is intrahepatic and extrahepatic biliary
dilatation, similar to the previous study. The common bile duct
measures up to 9 mm in diameter. The gallbladder is incompletely
distended. No evidence of gallstones or gallbladder wall thickening.

Pancreas: The pancreas appears stable with mild ductal dilatation in
the pancreatic head. No evidence of pancreatic head mass,
surrounding inflammation or fluid collection.

Spleen: Normal in size without focal abnormality.

Adrenals/Urinary Tract: Both adrenal glands appear normal. There are
bilateral renal cysts. Thin mural calcification has developed within
a 1.6 cm cyst in the interpolar region of the left kidney. No solid
components are identified. There is no evidence of urinary tract
calculus or hydronephrosis. No definite intrinsic bladder
abnormality. There is an asymmetric mass involving the bladder base
on the right which appears contiguous with the prostate gland,
further described below.

Stomach/Bowel: The stomach and proximal small bowel appear normal.
There is no bowel distension. There is irregular wall thickening
involving the medial wall of the cecum, extending into the terminal
ileum consistent with the patient's known malignancy. No evidence of
resulting bowel obstruction. The appendix is not distended. No other
colonic abnormalities are seen.

Vascular/Lymphatic: There are no enlarged abdominal or pelvic lymph
nodes. Aortic and branch vessel atherosclerosis. No acute vascular
findings. The portal, superior mesenteric and splenic veins are
patent.

Reproductive: There is an asymmetric mass involving the right lobe
of the prostate gland, measuring 5.0 x 3.8 cm on image 107/2. This
measures 4.7 cm on sagittal image 94/6. This demonstrates
asymmetrically increased enhancement which extends into the right
seminal vesicle and bladder base on the right. No evidence of
ureteral obstruction.

Other: Postsurgical changes in the right groin consistent with prior
inguinal herniorrhaphy. There are scattered surgical clips in the
left upper quadrant. No ascites or peritoneal nodularity.

Musculoskeletal: No acute or significant osseous findings.
IMPRESSION: 1. Irregular wall thickening involving the medial wall of the cecum
extending into the terminal ileum consistent with the patient's
known malignancy. No evidence of bowel obstruction, perforation or
metastatic disease.
2. Asymmetric mass involving the right lobe of the prostate gland
with extension into the right seminal vesicle and bladder base,
suspicious for prostate cancer with local invasion. Recommend
correlation with serum PSA levels and urology evaluation.
3. Stable biliary dilatation, likely physiologic based on
chronicity.
4. Bilateral renal cysts.
5. Age-indeterminate T12 compression deformity.
6. Aortic Atherosclerosis ([facility]Y9I-M[facility].H).

## 2021-09-03 ENCOUNTER — Other Ambulatory Visit (HOSPITAL_COMMUNITY): Payer: Medicare HMO

## 2021-09-10 ENCOUNTER — Ambulatory Visit (HOSPITAL_COMMUNITY): Payer: Medicare HMO | Admitting: Hematology

## 2022-06-14 IMAGING — DX DG CHEST 2V
2 series · 2 of 2 positions shown · non-contrast
Comparison: Radiograph 10/01/2020. Chest CT 09/04/2020. Lung bases
from abdominal CT 10/09/2020

CLINICAL DATA: Follow-up pneumonia. Shortness of breath.

EXAM:
CHEST - 2 VIEW

[chest pa]
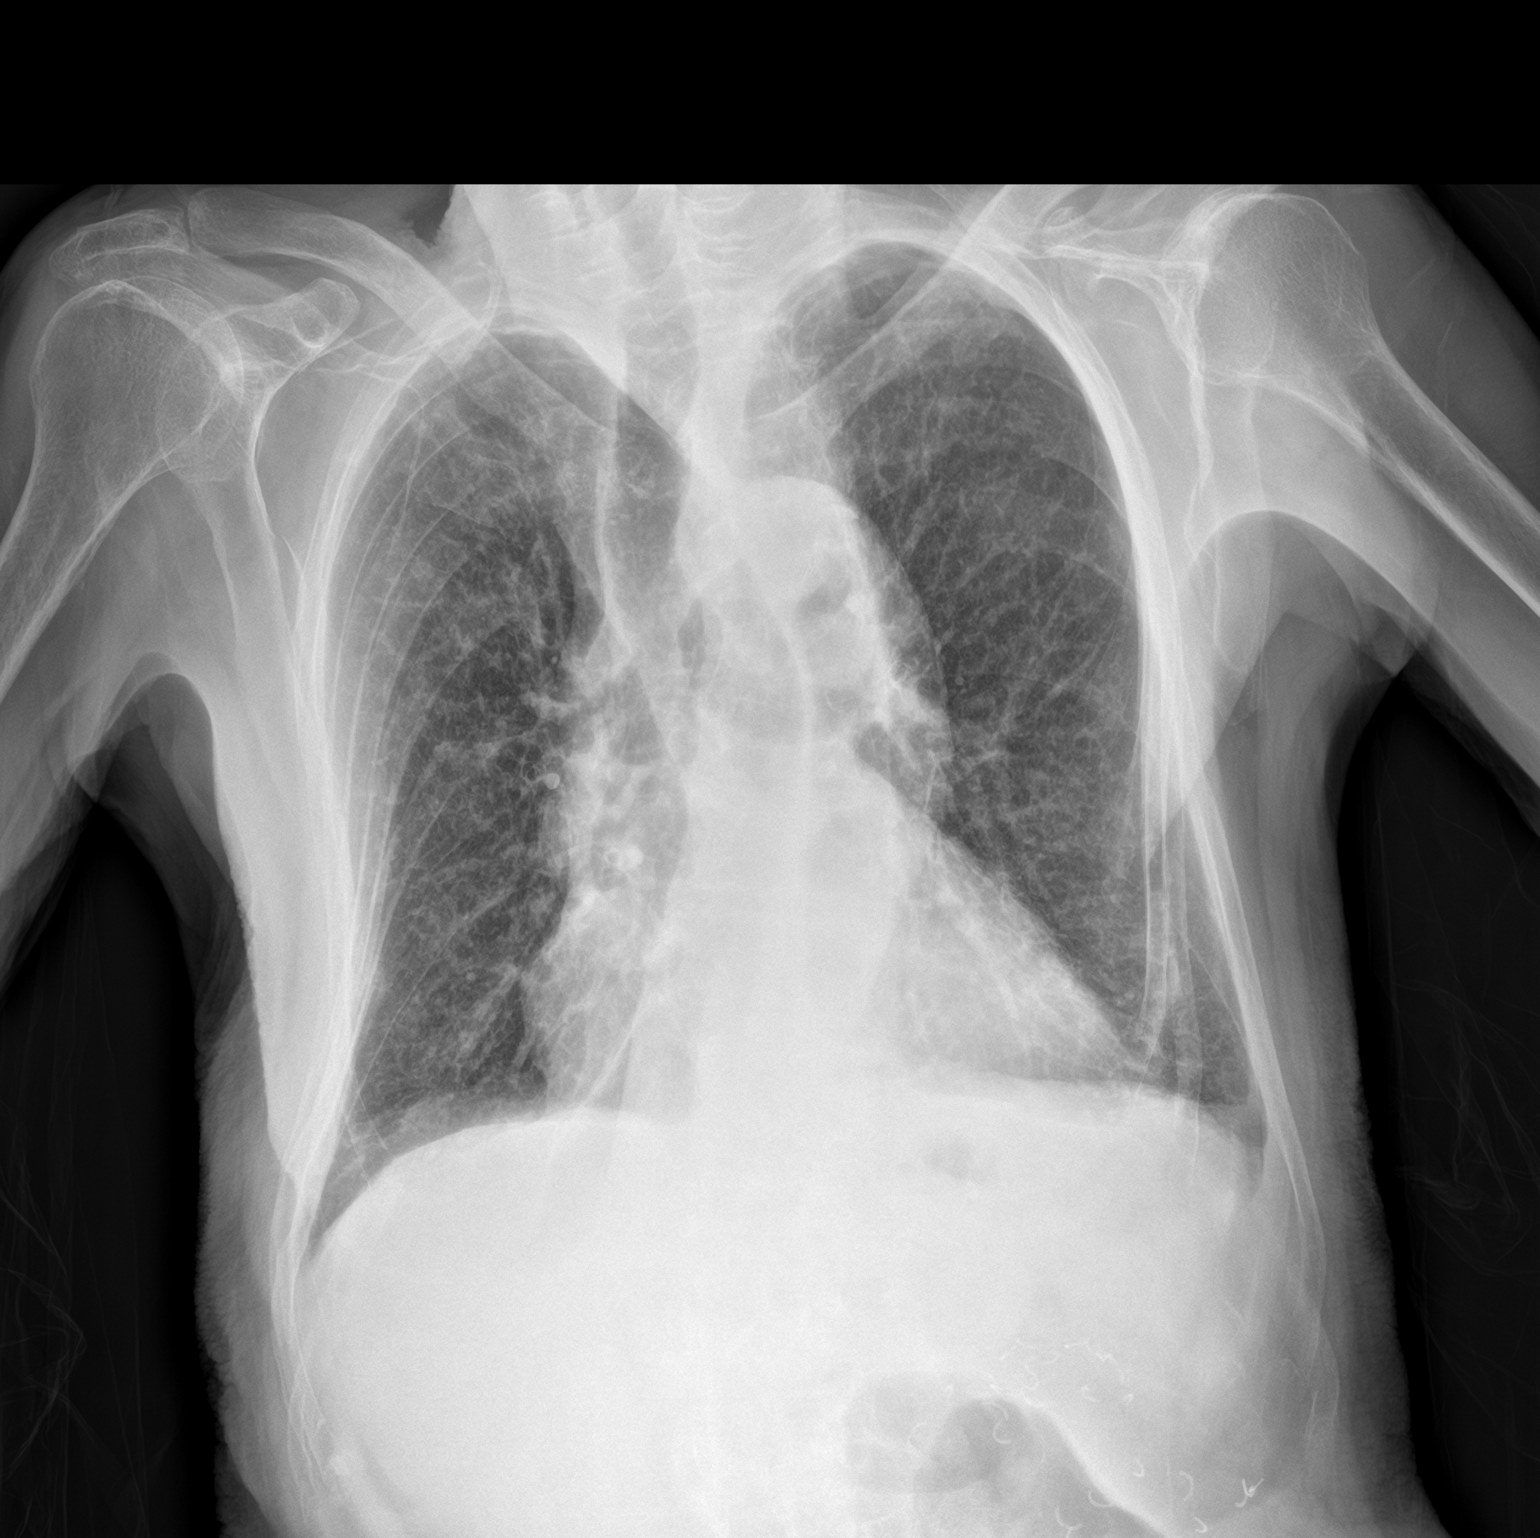

[chest lat]
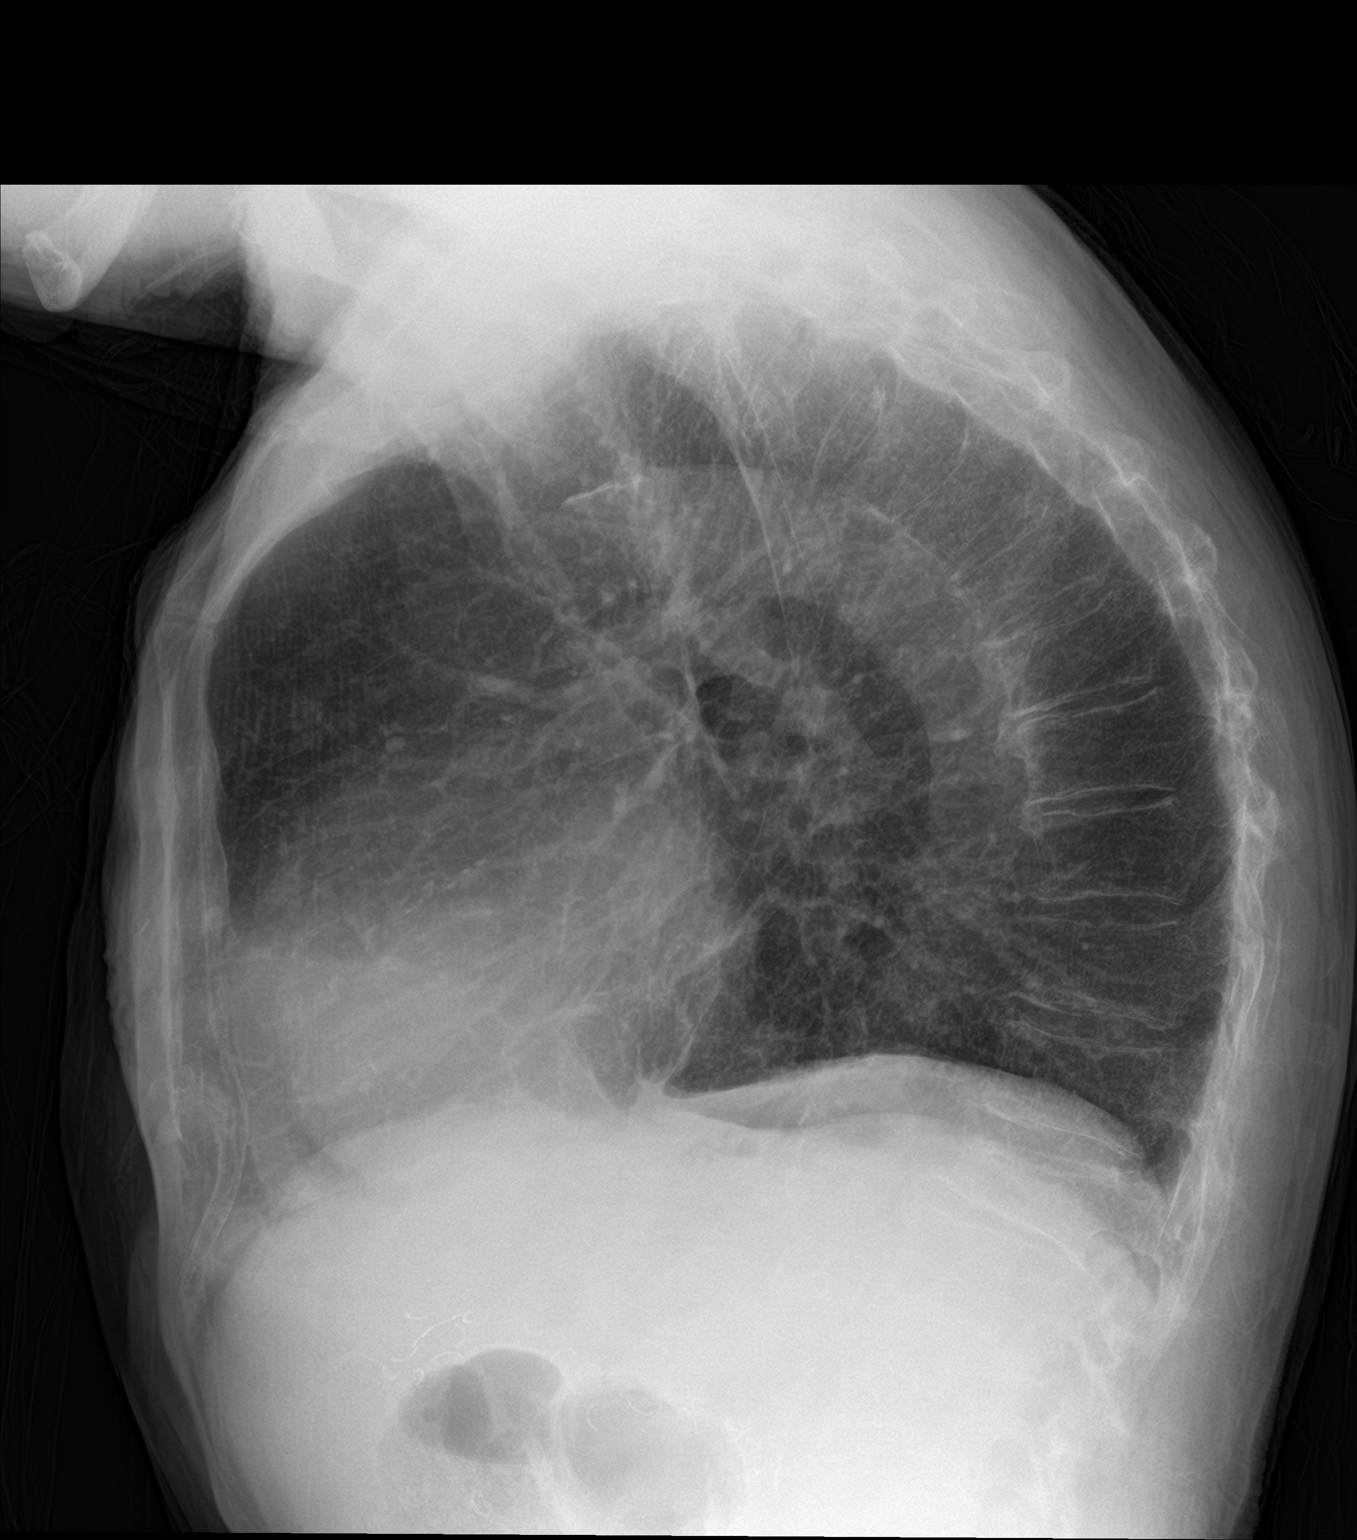

[2 of 2 positions shown; findings below may reference images not displayed]

FINDINGS: Improved lung volumes from prior exam. Previous right upper lobe
opacities are not definitively seen on the current exam. The heart
is normal in size. Stable aortic tortuosity and atherosclerosis.
Increased AP diameter of the thorax. Possible small pleural
effusions. Coarse lung markings with improvement in vascular
congestion. No convincing pulmonary edema or Kerley B-lines. No
pneumothorax. Bones are under mineralized. Chronic compression
fractures in the mid and lower thoracic spine.
IMPRESSION: 1. Improved lung volumes. Previous right upper lobe opacities are
not definitively seen on the current exam.
2. Possible small pleural effusions.
3. Improved vascular congestion.
